# Patient Record
Sex: Female | Born: 1945 | Race: Asian | Hispanic: No | Smoking: Never smoker
Health system: Southern US, Community
[De-identification: ages and names within clinical notes are randomized; demographics above are authoritative.]

## PROBLEM LIST (undated history)

## (undated) DIAGNOSIS — I10 Essential (primary) hypertension: Secondary | ICD-10-CM

## (undated) DIAGNOSIS — M1990 Unspecified osteoarthritis, unspecified site: Secondary | ICD-10-CM

## (undated) DIAGNOSIS — I491 Atrial premature depolarization: Secondary | ICD-10-CM

## (undated) DIAGNOSIS — E785 Hyperlipidemia, unspecified: Secondary | ICD-10-CM

## (undated) DIAGNOSIS — I471 Supraventricular tachycardia, unspecified: Secondary | ICD-10-CM

## (undated) HISTORY — DX: Hyperlipidemia, unspecified: E78.5

## (undated) HISTORY — DX: Essential (primary) hypertension: I10

## (undated) HISTORY — PX: KNEE SURGERY: SHX244

## (undated) HISTORY — DX: Atrial premature depolarization: I49.1

## (undated) HISTORY — DX: Supraventricular tachycardia, unspecified: I47.10

## (undated) HISTORY — PX: JOINT REPLACEMENT: SHX530

## (undated) HISTORY — DX: Supraventricular tachycardia: I47.1

---

## 1994-01-28 HISTORY — PX: KNEE ARTHROSCOPY: SHX127

## 2009-05-29 ENCOUNTER — Ambulatory Visit: Payer: Self-pay | Admitting: Family Medicine

## 2009-06-29 ENCOUNTER — Encounter (INDEPENDENT_AMBULATORY_CARE_PROVIDER_SITE_OTHER): Payer: Self-pay | Admitting: Family Medicine

## 2009-06-29 ENCOUNTER — Ambulatory Visit: Payer: Self-pay | Admitting: Internal Medicine

## 2009-06-29 LAB — CONVERTED CEMR LAB
ALT: 9 units/L (ref 0–35)
AST: 16 units/L (ref 0–37)
Alkaline Phosphatase: 45 units/L (ref 39–117)
Basophils Absolute: 0 10*3/uL (ref 0.0–0.1)
Calcium: 8.9 mg/dL (ref 8.4–10.5)
Chloride: 106 meq/L (ref 96–112)
Creatinine, Ser: 0.75 mg/dL (ref 0.40–1.20)
Eosinophils Absolute: 0.1 10*3/uL (ref 0.0–0.7)
HDL: 75 mg/dL (ref >39)
LDL Cholesterol: 180 mg/dL — ABNORMAL HIGH (ref 0–99)
Lymphocytes Relative: 32 % (ref 12–46)
Lymphs Abs: 1.4 10*3/uL (ref 0.7–4.0)
MCV: 87.7 fL (ref 78.0–100.0)
Neutrophils Relative %: 58 % (ref 43–77)
Platelets: 198 10*3/uL (ref 150–400)
RDW: 13.3 % (ref 11.5–15.5)
TSH: 1.165 microintl units/mL (ref 0.350–4.500)
Total Bilirubin: 1.3 mg/dL — ABNORMAL HIGH (ref 0.3–1.2)
Total CHOL/HDL Ratio: 3.6
VLDL: 14 mg/dL (ref 0–40)
Vit D, 25-Hydroxy: 11 ng/mL — ABNORMAL LOW (ref 30–89)
WBC: 4.2 10*3/uL (ref 4.0–10.5)

## 2009-07-12 ENCOUNTER — Ambulatory Visit: Payer: Self-pay | Admitting: Internal Medicine

## 2009-07-25 ENCOUNTER — Ambulatory Visit (HOSPITAL_COMMUNITY): Admission: RE | Admit: 2009-07-25 | Discharge: 2009-07-25 | Payer: Self-pay | Admitting: Family Medicine

## 2009-08-07 ENCOUNTER — Encounter: Admission: RE | Admit: 2009-08-07 | Discharge: 2009-08-07 | Payer: Self-pay | Admitting: Family Medicine

## 2009-08-08 ENCOUNTER — Telehealth (INDEPENDENT_AMBULATORY_CARE_PROVIDER_SITE_OTHER): Payer: Self-pay | Admitting: *Deleted

## 2009-08-15 ENCOUNTER — Ambulatory Visit: Payer: Self-pay | Admitting: Family Medicine

## 2009-08-31 ENCOUNTER — Encounter (INDEPENDENT_AMBULATORY_CARE_PROVIDER_SITE_OTHER): Payer: Self-pay | Admitting: Family Medicine

## 2009-08-31 ENCOUNTER — Ambulatory Visit: Payer: Self-pay | Admitting: Cardiology

## 2009-08-31 ENCOUNTER — Ambulatory Visit (HOSPITAL_COMMUNITY): Admission: RE | Admit: 2009-08-31 | Discharge: 2009-08-31 | Payer: Self-pay | Admitting: Family Medicine

## 2009-11-23 ENCOUNTER — Observation Stay (HOSPITAL_COMMUNITY): Admission: EM | Admit: 2009-11-23 | Discharge: 2009-11-24 | Payer: Self-pay | Admitting: Emergency Medicine

## 2010-01-12 DIAGNOSIS — I10 Essential (primary) hypertension: Secondary | ICD-10-CM

## 2010-01-12 DIAGNOSIS — R0789 Other chest pain: Secondary | ICD-10-CM

## 2010-01-12 DIAGNOSIS — E785 Hyperlipidemia, unspecified: Secondary | ICD-10-CM | POA: Insufficient documentation

## 2010-01-15 ENCOUNTER — Ambulatory Visit: Payer: Self-pay | Admitting: Cardiovascular Disease

## 2010-02-01 ENCOUNTER — Encounter
Admission: RE | Admit: 2010-02-01 | Discharge: 2010-02-01 | Payer: Self-pay | Source: Home / Self Care | Attending: Family Medicine | Admitting: Family Medicine

## 2010-02-27 NOTE — Progress Notes (Signed)
Summary: triage/chest pain/dizziness  Phone Note Call from Patient   Caller: Daughter Reason for Call: Talk to Nurse Summary of Call: Daughter states she doesn't know what is wrong with her mother..She has been having chest pain and is dizzy. She has been unable to sleep all night. Patient is from [place] and is 65 years old. Patient had diagnostic study of mass in left breast found on mammogram 07/2009 which states mass is probably benign. Patient and family decided to have 6 month follow up instead of biospy. Advised daughter to take mom to the emergency room or call 911 since the chest pain with dizziness is a new concern and possibly cardiac related.  Daughter states understanding and will do so. Initial call taken by: Conchita Paris,  August 08, 2009 9:33 AM

## 2010-04-11 LAB — POCT I-STAT, CHEM 8
BUN: 14 mg/dL (ref 6–23)
Calcium, Ion: 1.14 mmol/L (ref 1.12–1.32)
Chloride: 105 mEq/L (ref 96–112)
Glucose, Bld: 112 mg/dL — ABNORMAL HIGH (ref 70–99)
HCT: 43 % (ref 36.0–46.0)
Potassium: 4.9 mEq/L (ref 3.5–5.1)

## 2010-04-11 LAB — HEMOGLOBIN A1C: Mean Plasma Glucose: 126 mg/dL — ABNORMAL HIGH (ref <117)

## 2010-04-11 LAB — LIPID PANEL
Cholesterol: 265 mg/dL — ABNORMAL HIGH (ref 0–200)
Total CHOL/HDL Ratio: 3.8 RATIO

## 2010-04-11 LAB — CARDIAC PANEL(CRET KIN+CKTOT+MB+TROPI)
Relative Index: 1.4 (ref 0.0–2.5)
Total CK: 106 U/L (ref 7–177)

## 2010-04-11 LAB — CBC
MCH: 29.7 pg (ref 26.0–34.0)
MCHC: 33.8 g/dL (ref 30.0–36.0)
Platelets: 201 10*3/uL (ref 150–400)
RDW: 13.7 % (ref 11.5–15.5)

## 2010-04-11 LAB — DIFFERENTIAL
Basophils Absolute: 0 10*3/uL (ref 0.0–0.1)
Basophils Relative: 1 % (ref 0–1)
Eosinophils Absolute: 0.2 10*3/uL (ref 0.0–0.7)
Neutro Abs: 2.6 10*3/uL (ref 1.7–7.7)
Neutrophils Relative %: 51 % (ref 43–77)

## 2010-04-11 LAB — TSH: TSH: 1.313 u[IU]/mL (ref 0.350–4.500)

## 2010-04-11 LAB — APTT: aPTT: 32 seconds (ref 24–37)

## 2010-04-11 LAB — HEPATIC FUNCTION PANEL
Albumin: 3.4 g/dL — ABNORMAL LOW (ref 3.5–5.2)
Alkaline Phosphatase: 37 U/L — ABNORMAL LOW (ref 39–117)
Indirect Bilirubin: 0.8 mg/dL (ref 0.3–0.9)
Total Bilirubin: 0.9 mg/dL (ref 0.3–1.2)
Total Protein: 6.8 g/dL (ref 6.0–8.3)

## 2010-04-11 LAB — URINALYSIS, ROUTINE W REFLEX MICROSCOPIC
Ketones, ur: NEGATIVE mg/dL
Nitrite: NEGATIVE
Protein, ur: NEGATIVE mg/dL
Urobilinogen, UA: 0.2 mg/dL (ref 0.0–1.0)
pH: 7.5 (ref 5.0–8.0)

## 2010-04-11 LAB — COMPREHENSIVE METABOLIC PANEL
Alkaline Phosphatase: 33 U/L — ABNORMAL LOW (ref 39–117)
BUN: 9 mg/dL (ref 6–23)
Chloride: 109 mEq/L (ref 96–112)
Glucose, Bld: 99 mg/dL (ref 70–99)
Potassium: 4.1 mEq/L (ref 3.5–5.1)
Total Bilirubin: 1.6 mg/dL — ABNORMAL HIGH (ref 0.3–1.2)

## 2010-04-11 LAB — CK TOTAL AND CKMB (NOT AT ARMC)
CK, MB: 1.8 ng/mL (ref 0.3–4.0)
Total CK: 125 U/L (ref 7–177)

## 2010-04-11 LAB — D-DIMER, QUANTITATIVE: D-Dimer, Quant: <0.22 ug/mL-FEU (ref 0.00–0.48)

## 2010-04-11 LAB — PROTIME-INR: INR: 0.98 (ref 0.00–1.49)

## 2010-04-11 LAB — LIPASE, BLOOD: Lipase: 45 U/L (ref 11–59)

## 2010-04-11 LAB — TROPONIN I: Troponin I: 0.02 ng/mL (ref 0.00–0.06)

## 2010-07-25 ENCOUNTER — Other Ambulatory Visit: Payer: Self-pay | Admitting: Family Medicine

## 2010-07-26 ENCOUNTER — Other Ambulatory Visit: Payer: Self-pay | Admitting: Family Medicine

## 2010-07-26 DIAGNOSIS — R922 Inconclusive mammogram: Secondary | ICD-10-CM

## 2010-08-02 ENCOUNTER — Ambulatory Visit
Admission: RE | Admit: 2010-08-02 | Discharge: 2010-08-02 | Disposition: A | Discharge status: Home / Self Care | Payer: Self-pay | Source: Ambulatory Visit | Attending: Family Medicine | Admitting: Family Medicine

## 2010-08-02 DIAGNOSIS — R922 Inconclusive mammogram: Secondary | ICD-10-CM

## 2011-03-12 ENCOUNTER — Ambulatory Visit (INDEPENDENT_AMBULATORY_CARE_PROVIDER_SITE_OTHER): Payer: Self-pay | Admitting: Cardiovascular Disease

## 2011-03-12 ENCOUNTER — Encounter: Payer: Self-pay | Admitting: Cardiovascular Disease

## 2011-03-12 VITALS — BP 140/90 | HR 76 | Ht 63.0 in | Wt 134.0 lb

## 2011-03-12 DIAGNOSIS — R002 Palpitations: Secondary | ICD-10-CM | POA: Insufficient documentation

## 2011-03-12 NOTE — Assessment & Plan Note (Signed)
Normal cardiac examination. Likely premature beats. Will arrange 48 hour holter monitor.

## 2011-03-12 NOTE — Patient Instructions (Signed)
Your physician recommends that you schedule a follow-up appointment in: 2-3 weeks.   Your physician has recommended that you wear a holter monitor. Holter monitors are medical devices that record the heart's electrical activity. Doctors most often use these monitors to diagnose arrhythmias. Arrhythmias are problems with the speed or rhythm of the heartbeat. The monitor is a small, portable device. You can wear one while you do your normal daily activities. This is usually used to diagnose what is causing palpitations/syncope (passing out).

## 2011-03-12 NOTE — Progress Notes (Signed)
   History of Present Illness: 66 yo [place] female with history of HTN, HLD here today to establish cardiology care. She has not been seen by cardiology in the past. I have reviewed the records from [facility] in [place] and see that she was admitted October 2011 with chest pain and ruled out with serial enzymes. She was recently seen in [facility] and had c/o palpitations. She tells me that her chest does not hurt. She occasionally feels her heart skip beats. Breathing has been ok. She speaks poor  English so communication if not easy.   Past Medical History  Diagnosis Date  . Chest pain   . Hyperlipidemia   . Hypertension     Past Surgical History  Procedure Date  . None     Current Outpatient Prescriptions  Medication Sig Dispense Refill  . alendronate (FOSAMAX) 70 MG tablet Take 70 mg by mouth every 7 (seven) days. Take with a full glass of water on an empty stomach.      . calcium-vitamin D (OSCAL WITH D) 500-200 MG-UNIT per tablet Take 1 tablet by mouth 2 (two) times daily.      . pravastatin (PRAVACHOL) 40 MG tablet Take 40 mg by mouth daily.        No Known Allergies  History   Social History  . Marital Status: Divorced    Spouse Name: N/A    Number of Children: N/A  . Years of Education: N/A   Occupational History  . Not on file.   Social History Main Topics  . Smoking status: Not on file  . Smokeless tobacco: Not on file  . Alcohol Use: Not on file  . Drug Use: Not on file  . Sexually Active: Not on file   Other Topics Concern  . Not on file   Social History Narrative   Negative family `history--Specifically artery disease or MI. The patient has two siblings died at a young age from fevers.The patient is separated and single. 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 She has never drink or smoke or done drugs    No family history on file. No cardiac issues in family She does not know of any  health problems in her family.   Review of Systems:  As stated in the HPI and otherwise negative.   BP 140/90  Pulse 76  Ht 5\' 3"  (1.6 m)  Wt 134 lb (60.782 kg)  BMI 23.74 kg/m2  Physical Examination: General: Well developed, well nourished, NAD HEENT: OP clear, mucus membranes moist SKIN: warm, dry. No rashes. Neuro: No focal deficits Musculoskeletal: Muscle strength 5/5 all ext Psychiatric: Mood and affect normal Neck: No JVD, no carotid bruits, no thyromegaly, no lymphadenopathy. Lungs:Clear bilaterally, no wheezes, rhonci, crackles Cardiovascular: Regular rate and rhythm. No murmurs, gallops or rubs. Abdomen:Soft. Bowel sounds present. Non-tender.  Extremities: No lower extremity edema. Pulses are 2 + in the bilateral DP/PT.  EKG: NSR, rate 71 bpm. Normal EKG

## 2011-03-19 ENCOUNTER — Encounter (INDEPENDENT_AMBULATORY_CARE_PROVIDER_SITE_OTHER): Payer: Self-pay

## 2011-03-19 DIAGNOSIS — R002 Palpitations: Secondary | ICD-10-CM

## 2011-03-22 ENCOUNTER — Telehealth: Payer: Self-pay | Admitting: Cardiovascular Disease

## 2011-03-22 DIAGNOSIS — R002 Palpitations: Secondary | ICD-10-CM

## 2011-03-22 MED ORDER — METOPROLOL SUCCINATE ER 25 MG PO TB24: 25.0000 mg | ORAL_TABLET | Freq: Every day | ORAL | Status: DC

## 2011-03-22 NOTE — Telephone Encounter (Signed)
Pt answered phone and then gave phone to grandson to speak with me. I reviewed monitor results and Dr. Gibson Ramp recommendations for pt to start Toprol 25 mg by mouth daily with grandson. Will send prescription to [facility] on [address] per his request.  Grandson aware of pt's follow up appt with Dr. Clifton James on Feb. 28, 2013.

## 2011-03-22 NOTE — Telephone Encounter (Signed)
PACs and short runs of SVT on monitor. Can we let her know and start Toprol XL 25 mg po Qdaily. Thanks, chris

## 2011-03-22 NOTE — Telephone Encounter (Signed)
Addended by: Dossie Arbour on: 03/22/2011 11:41 AM   Modules accepted: Orders

## 2011-03-28 ENCOUNTER — Ambulatory Visit (INDEPENDENT_AMBULATORY_CARE_PROVIDER_SITE_OTHER): Payer: Self-pay | Admitting: Cardiovascular Disease

## 2011-03-28 ENCOUNTER — Encounter: Payer: Self-pay | Admitting: Cardiovascular Disease

## 2011-03-28 DIAGNOSIS — R002 Palpitations: Secondary | ICD-10-CM

## 2011-03-28 DIAGNOSIS — R079 Chest pain, unspecified: Secondary | ICD-10-CM

## 2011-03-28 NOTE — Progress Notes (Signed)
   History of Present Illness: 66 yo Ariel Harris [place] female with history of HTN, HLD here today for cardiac follow up. I saw her two weeks ago as a new pt. She was admitted October 2011 to [facility] with chest pain and ruled out with serial enzymes. She was recently seen in [facility] and had c/o palpitations. She tells me that her chest does not hurt. She occasionally feels her heart skip beats. Breathing has been ok. I ordered a 48 hour Holter monitor which showed sinus rhythm with PACs and shorts runs of SVT.  I started Toprol and she is feeling less skipped beats.   She is here for follow up. She notes SOB and chest pain during the day. This is severe at times. The SOB seems to be worsened when she has the chest pain. Central tightness with no radiation of pain. Occurs at rest and with exertion.   Primary Care Physician:  None   Past Medical History  Diagnosis Date  . Chest pain   . Hyperlipidemia   . Hypertension     Past Surgical History  Procedure Date  . None     Current Outpatient Prescriptions  Medication Sig Dispense Refill  . alendronate (FOSAMAX) 70 MG tablet Take 70 mg by mouth every 7 (seven) days. Take with a full glass of water on an empty stomach.      . calcium-vitamin D (OSCAL WITH D) 500-200 MG-UNIT per tablet Take 1 tablet by mouth 2 (two) times daily.      . metoprolol succinate (TOPROL XL) 25 MG 24 hr tablet Take 1 tablet (25 mg total) by mouth daily.  30 tablet  11  . pravastatin (PRAVACHOL) 40 MG tablet Take 40 mg by mouth daily.        No Known Allergies  History   Social History  . Marital Status: Divorced    Spouse Name: N/A    Number of Children: N/A  . Years of Education: N/A   Occupational History  . Not on file.   Social History Main Topics  . Smoking status: Never Smoker   . Smokeless tobacco: Not on file  . Alcohol Use: No  . Drug Use: No  . Sexually Active: Not on file   Other Topics Concern  . Not on file   Social History  Narrative   Negative family `history--Specifically artery disease or MI. The patient has two siblings died at a young age from fevers.The patient is separated and single. 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 She has never drink or smoke or done drugs    No family history on file.  Review of Systems:  As stated in the HPI and otherwise negative.   BP 130/76  Pulse 57  Ht 5\' 7"  (1.702 m)  Wt 135 lb (61.236 kg)  BMI 21.14 kg/m2  Physical Examination: General: Well developed, well nourished, NAD HEENT: OP clear, mucus membranes moist SKIN: warm, dry. No rashes. Neuro: No focal deficits Musculoskeletal: Muscle strength 5/5 all ext Psychiatric: Mood and affect normal Neck: No JVD, no carotid bruits, no thyromegaly, no lymphadenopathy. Lungs:Clear bilaterally, no wheezes, rhonci, crackles Cardiovascular: Regular rate and rhythm. No murmurs, gallops or rubs. Abdomen:Soft. Bowel sounds present. Non-tender.  Extremities: No lower extremity edema. Pulses are 2 + in the bilateral DP/PT.  EKG:

## 2011-03-28 NOTE — Assessment & Plan Note (Signed)
Even with an interpretor, her communication is difficult. Because of her chest pain and SOB with risk factors will arrange Lexiscan stress myoview to exclude ischemia.

## 2011-03-28 NOTE — Assessment & Plan Note (Signed)
Most likely due to PACs or SVT. Symptoms are much improved with the addition of Toprol

## 2011-03-28 NOTE — Patient Instructions (Signed)
Your physician recommends that you schedule a follow-up appointment in:  1 month.  Your physician has requested that you have a lexiscan myoview. For further information please visit [email or web address]. Please follow instruction sheet, as given.    

## 2011-04-10 ENCOUNTER — Ambulatory Visit (HOSPITAL_COMMUNITY): Payer: Self-pay | Attending: Cardiology | Admitting: Radiology

## 2011-04-10 DIAGNOSIS — R0602 Shortness of breath: Secondary | ICD-10-CM

## 2011-04-10 DIAGNOSIS — R001 Bradycardia, unspecified: Secondary | ICD-10-CM

## 2011-04-10 DIAGNOSIS — I498 Other specified cardiac arrhythmias: Secondary | ICD-10-CM | POA: Insufficient documentation

## 2011-04-10 DIAGNOSIS — R079 Chest pain, unspecified: Secondary | ICD-10-CM | POA: Insufficient documentation

## 2011-04-10 MED ORDER — AMINOPHYLLINE 25 MG/ML IV SOLN
75.0000 mg | Freq: Once | INTRAVENOUS | Status: AC
  Administered 2011-04-10: 75 mg via INTRAVENOUS

## 2011-04-10 MED ORDER — REGADENOSON 0.4 MG/5ML IV SOLN
0.4000 mg | Freq: Once | INTRAVENOUS | Status: AC
  Administered 2011-04-10: 0.4 mg via INTRAVENOUS

## 2011-04-10 MED ORDER — TECHNETIUM TC 99M TETROFOSMIN IV KIT
30.0000 | PACK | Freq: Once | INTRAVENOUS | Status: AC | PRN
  Administered 2011-04-10: 30 via INTRAVENOUS

## 2011-04-10 MED ORDER — ATROPINE SULFATE 0.1 MG/ML IJ SOLN
0.5000 mg | Freq: Once | INTRAMUSCULAR | Status: AC
  Administered 2011-04-10: 0.5 mg via INTRAVENOUS

## 2011-04-10 MED ORDER — TECHNETIUM TC 99M TETROFOSMIN IV KIT
10.0000 | PACK | Freq: Once | INTRAVENOUS | Status: AC | PRN
  Administered 2011-04-10: 10 via INTRAVENOUS

## 2011-04-10 NOTE — Progress Notes (Signed)
[facility] SITE 3 NUCLEAR MED [address] [place] [address] [phone]  Cardiology Nuclear Med Study  [address] Ariel Harris is a 66 y.o. female [id number] [birth year]   Nuclear Med Background Indication for Stress Test:  Evaluation for Ischemia History: 08/11 Echo: EF: 60-65% Cardiac Risk Factors: Hypertension and Lipids  Symptoms:  Chest Pain, Chest Tightness, DOE, Palpitations and SOB   Nuclear Pre-Procedure Caffeine/Decaff Intake:  None NPO After: 8:30pm   Lungs:  clear IV 0.9% NS with Angio Cath:  20g  IV Site: R Antecubital  IV Started by:  Stanton Kidney, EMT-P  Chest Size (in):  32 Cup Size: B  Height: 5\' 7"  (1.702 m)  Weight:  133 lb (60.328 kg)  BMI:  Body mass index is 20.83 kg/(m^2). Tech Comments:  Meds taken as directed. This patient is very sensitive to [employer]. She had a syncopal episode with it. It took Aminophylline, Atropine, and 750cc NS infusion to get the patient to a hemodynamic state to be able to get her 2nd set of pictures. After time and lying down she was able to maintain a BP of 100/64, without symptoms and she went home. The patient's husband came and picked her up. S.Williams EMTP    Nuclear Med Study 1 or 2 day study: 1 day  Stress Test Type:  Eugenie Birks  Reading MD: Marca Ancona, MD  Order Authorizing Provider:  C.McAlhany  Resting Radionuclide: Technetium 73m Tetrofosmin  Resting Radionuclide Dose: 10.8 mCi   Stress Radionuclide:  Technetium 31m Tetrofosmin  Stress Radionuclide Dose: 32.8 mCi           Stress Protocol Rest HR: 59 Stress HR: 90  Rest BP: 123/90 Stress BP: 131/90  Exercise Time (min): n/a METS: n/a   Predicted Max HR: 156 bpm % Max HR: 57.69 bpm Rate Pressure Product: 86578   Dose of Adenosine (mg):  n/a Dose of Lexiscan: 0.4 mg  Dose of Atropine (mg): n/a Dose of Dobutamine: n/a mcg/kg/min (at max HR)  Stress Test Technologist: Milana Na, EMT-P  Nuclear Technologist:  Domenic Polite, CNMT      Rest Procedure:  Myocardial perfusion imaging was performed at rest 45 minutes following the intravenous administration of Technetium 33m Tetrofosmin. Rest ECG: NSR  Stress Procedure:  The patient received IV Lexiscan 0.4 mg over 15-seconds.  Technetium 54m Tetrofosmin injected at 30-seconds.  There were no significant changes, rhythm changes from SBradycardia to a Nodal rhythm with pacs/pjcs with Lexiscan.  Quantitative spect images were obtained after a 45 minute delay. Stress ECG: No ST changes but developed junctional bradycardia with infusion.   QPS Raw Data Images:  Normal; no motion artifact; normal heart/lung ratio. Stress Images:  Small, mild apical perfusion defect.  Rest Images:  Small, mild apical perfusion defect.  Subtraction (SDS):  Fixed small mild apical perfusion defect.  Transient Ischemic Dilatation (Normal <1.22):  0.89 Lung/Heart Ratio (Normal <0.45):  0.29  Quantitative Gated Spect Images QGS EDV:  75 ml QGS ESV:  19 ml QGS cine images:  NL LV Function; NL Wall Motion QGS EF: 74%  Impression Exercise Capacity:  Lexiscan with no exercise. BP Response:  Hypotensive blood pressure response. Clinical Symptoms:  Chest tightness.  ECG Impression:  No ST changes but became bradycardic with junctional rhythm with infusion.  Comparison with Prior Nuclear Study: No prior study.  Overall Impression:  Normal stress nuclear study.  Fixed, small mild apical perfusion defect with normal wall motion likely represents apical thinning.  Ariel Harris

## 2011-05-02 ENCOUNTER — Ambulatory Visit (INDEPENDENT_AMBULATORY_CARE_PROVIDER_SITE_OTHER): Payer: Self-pay | Admitting: Cardiovascular Disease

## 2011-05-02 ENCOUNTER — Encounter: Payer: Self-pay | Admitting: Cardiovascular Disease

## 2011-05-02 DIAGNOSIS — R079 Chest pain, unspecified: Secondary | ICD-10-CM

## 2011-05-02 DIAGNOSIS — R002 Palpitations: Secondary | ICD-10-CM

## 2011-05-02 MED ORDER — METOPROLOL TARTRATE 25 MG PO TABS: 25.0000 mg | ORAL_TABLET | Freq: Two times a day (BID) | ORAL | Status: DC

## 2011-05-02 NOTE — Assessment & Plan Note (Signed)
Likely due to PACs and SVT. Will continue beta blocker. Will change Toprol to Lopressor 25 mg po BID.

## 2011-05-02 NOTE — Patient Instructions (Signed)
Your physician wants you to follow-up in: 12 months.  You will receive a reminder letter in the mail two months in advance. If you don't receive a letter, please call our office to schedule the follow-up appointment.  Your physician has recommended you make the following change in your medication: Stop Toprol.  Start metoprolol tartrate 25 mg by mouth twice daily.

## 2011-05-02 NOTE — Progress Notes (Signed)
History of Present Illness: 66 yo [place] female with history of HTN, HLD here today for cardiac follow up. I saw her several weeks ago as a new pt. She was admitted October 2011 to [facility] with chest pain and ruled out with serial enzymes. She was recently seen in [facility] and had c/o palpitations.  She occasionally feels her heart skip beats. Breathing has been ok. I ordered a 48 hour Holter monitor which showed sinus rhythm with PACs and shorts runs of SVT. I started Toprol and she is feeling less skipped beats. At her last visit, she described chest pains.  This is severe at times. The SOB seems to be worsened when she has the chest pain. Central tightness with no radiation of pain. Occurs at rest and with exertion. I arranged a stress myoview on 04/10/11 which showed no ischemia with normal LV function. Her palpitations have improved. No recurrent chest pain.    Primary Care Physician: None  Past Medical History  Diagnosis Date  . Chest pain   . Hyperlipidemia   . Hypertension   . SVT (supraventricular tachycardia)   . PAC (premature atrial contraction)     Past Surgical History  Procedure Date  . None     Current Outpatient Prescriptions  Medication Sig Dispense Refill  . alendronate (FOSAMAX) 70 MG tablet Take 70 mg by mouth every 7 (seven) days. Take with a full glass of water on an empty stomach.      . calcium-vitamin D (OSCAL WITH D) 500-200 MG-UNIT per tablet Take 1 tablet by mouth 2 (two) times daily.      . pravastatin (PRAVACHOL) 40 MG tablet Take 40 mg by mouth daily.      . metoprolol succinate (TOPROL XL) 25 MG 24 hr tablet Take 1 tablet (25 mg total) by mouth daily.  30 tablet  11    No Known Allergies  History   Social History  . Marital Status: Divorced    Spouse Name: N/A    Number of Children: N/A  . Years of Education: N/A   Occupational History  . Not on file.   Social History Main Topics  . Smoking status: Never Smoker   .  Smokeless tobacco: Not on file  . Alcohol Use: No  . Drug Use: No  . Sexually Active: Not on file   Other Topics Concern  . Not on file   Social History Narrative   Negative family `history--Specifically artery disease or MI. The patient has two siblings died at a young age from fevers.The patient is separated and single. 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 She has never drink or smoke or done drugs    No family history on file.  Review of Systems:  As stated in the HPI and otherwise negative.   BP 160/90  Pulse 72  Ht 5\' 5"  (1.651 m)  Wt 136 lb (61.689 kg)  BMI 22.63 kg/m2  Physical Examination: General: Well developed, well nourished, NAD HEENT: OP clear, mucus membranes moist SKIN: warm, dry. No rashes. Neuro: No focal deficits Musculoskeletal: Muscle strength 5/5 all ext Psychiatric: Mood and affect normal Neck: No JVD, no carotid bruits, no thyromegaly, no lymphadenopathy. Lungs:Clear bilaterally, no wheezes, rhonci, crackles Cardiovascular: Regular rate and rhythm. No murmurs, gallops or rubs. Abdomen:Soft. Bowel sounds present. Non-tender.  Extremities: No lower extremity edema. Pulses are 2 + in the bilateral DP/PT.  Stress Myoview:  04/10/11: Stress Procedure: The patient received IV Lexiscan 0.4 mg over 15-seconds. Technetium 69m Tetrofosmin injected at 30-seconds. There were no significant changes, rhythm changes from SBradycardia to a Nodal rhythm with pacs/pjcs with Lexiscan. Quantitative spect images were obtained after a 45 minute delay.  Stress ECG: No ST changes but developed junctional bradycardia with infusion.  QPS  Raw Data Images: Normal; no motion artifact; normal heart/lung ratio.  Stress Images: Small, mild apical perfusion defect.  Rest Images: Small, mild apical perfusion defect.  Subtraction (SDS): Fixed small mild apical perfusion defect.  Transient Ischemic Dilatation (Normal <1.22):  0.89  Lung/Heart Ratio (Normal <0.45): 0.29  Quantitative Gated Spect Images  QGS EDV: 75 ml  QGS ESV: 19 ml  QGS cine images: NL LV Function; NL Wall Motion  QGS EF: 74%  Impression  Exercise Capacity: Lexiscan with no exercise.  BP Response: Hypotensive blood pressure response.  Clinical Symptoms: Chest tightness.  ECG Impression: No ST changes but became bradycardic with junctional rhythm with infusion.  Comparison with Prior Nuclear Study: No prior study.  Overall Impression: Normal stress nuclear study. Fixed, small mild apical perfusion defect with normal wall motion likely represents apical thinning.

## 2011-05-02 NOTE — Assessment & Plan Note (Signed)
Resolved. Stress test does not show ischemia. Likely non-cardiac chest pain. No further workup.

## 2011-07-10 ENCOUNTER — Other Ambulatory Visit: Payer: Self-pay | Admitting: Family Medicine

## 2011-07-10 DIAGNOSIS — N63 Unspecified lump in unspecified breast: Secondary | ICD-10-CM

## 2011-08-06 ENCOUNTER — Ambulatory Visit
Admission: RE | Admit: 2011-08-06 | Discharge: 2011-08-06 | Disposition: A | Discharge status: Home / Self Care | Payer: Self-pay | Source: Ambulatory Visit | Attending: Family Medicine | Admitting: Family Medicine

## 2011-08-06 DIAGNOSIS — N63 Unspecified lump in unspecified breast: Secondary | ICD-10-CM

## 2011-12-18 ENCOUNTER — Ambulatory Visit (INDEPENDENT_AMBULATORY_CARE_PROVIDER_SITE_OTHER): Payer: Self-pay | Admitting: Nurse Practitioner

## 2011-12-18 ENCOUNTER — Encounter: Payer: Self-pay | Admitting: Nurse Practitioner

## 2011-12-18 VITALS — BP 110/76 | HR 60 | Ht 62.0 in | Wt 144.8 lb

## 2011-12-18 DIAGNOSIS — R002 Palpitations: Secondary | ICD-10-CM

## 2011-12-18 MED ORDER — METOPROLOL TARTRATE 25 MG PO TABS: 12.5000 mg | ORAL_TABLET | Freq: Two times a day (BID) | ORAL | Status: DC

## 2011-12-18 NOTE — Progress Notes (Signed)
Ariel Harris Date of Birth: [birth year] Medical Record #[id number]  History of Present Illness: [name] is seen back today for a work in visit. She is seen for Dr. Clifton James. [place] is [place] She has HTN, HLD and palpitations with a past 48 hour Holter showing just PACs and short runs of SVT. She is on chronic beta blocker therapy. She had a negative Myoview back in March of this year which showed no ischemia with normal LV function.   She was last here in April and was felt to be doing ok.   She comes in today. She is here with the interpreter, "Jamelle Haring". She is complaining of fatigue. Not a lot of palpitations. Not using caffeine. Tries to stay active. Does walk.  No chest pain. Will get dizzy if she bends over. No passing out. Just fatigued. No chest pain. No recent labs. Sometimes she can hear her heart beating in her ears.   Current Outpatient Prescriptions on File Prior to Visit  Medication Sig Dispense Refill  . alendronate (FOSAMAX) 70 MG tablet Take 70 mg by mouth every 7 (seven) days. Take with a full glass of water on an empty stomach.      . calcium-vitamin D (OSCAL WITH D) 500-200 MG-UNIT per tablet Take 1 tablet by mouth 2 (two) times daily.      . pravastatin (PRAVACHOL) 40 MG tablet Take 40 mg by mouth daily.      . [DISCONTINUED] metoprolol tartrate (LOPRESSOR) 25 MG tablet Take 1 tablet (25 mg total) by mouth 2 (two) times daily.  60 tablet  11    No Known Allergies  Past Medical History  Diagnosis Date  . Chest pain   . Hyperlipidemia   . Hypertension   . SVT (supraventricular tachycardia)   . PAC (premature atrial contraction)     Past Surgical History  Procedure Date  . None     History  Smoking status  . Never Smoker   Smokeless tobacco  . Not on file    History  Alcohol Use No    History reviewed. No pertinent family history.  Review of Systems: The review of systems is per the HPI.  All other systems were reviewed and are  negative.  Physical Exam: BP 110/76  Pulse 60  Ht 5\' 2"  (1.575 m)  Wt 144 lb 12.8 oz (65.681 kg)  BMI 26.48 kg/m2  SpO2 98% Patient is very pleasant and in no acute distress. Skin is warm and dry. Color is normal.  HEENT is unremarkable. Normocephalic/atraumatic. PERRL. Sclera are nonicteric. Neck is supple. No masses. No JVD. Lungs are clear. Cardiac exam shows a regular rate and rhythm. Abdomen is soft. Extremities are without edema. Gait and ROM are intact. No gross neurologic deficits noted.   LABORATORY DATA: Pending  Overall Impression:  Normal stress nuclear study. Fixed, small mild apical perfusion defect with normal wall motion likely represents apical thinning.   Marca Ancona     Echo Study Conclusions from 2011  Left ventricle: The cavity size was normal. Wall thickness was normal. Systolic function was normal. The estimated ejection fraction was in the range of 60% to 65%. Wall motion was normal; there were no regional wall motion abnormalities. Left ventricular diastolic function parameters were normal.   Lab Results  Component Value Date   WBC 5.2 11/23/2009   HGB 14.6 11/23/2009   HCT 43.0 11/23/2009   PLT 201 11/23/2009   GLUCOSE 99 11/24/2009   CHOL  Value:  265        ATP III CLASSIFICATION:  <200     mg/dL   Desirable  409-811  mg/dL   Borderline High  >=914    mg/dL   High       * 78/29/5621   TRIG 67 11/24/2009   HDL 69 11/24/2009   LDLCALC  Value: 183        Total Cholesterol/HDL:CHD Risk Coronary Heart Disease Risk Table                     Men   Women  1/2 Average Risk   3.4   3.3  Average Risk       5.0   4.4  2 X Average Risk   9.6   7.1  3 X Average Risk  23.4   11.0        Use the calculated Patient Ratio above and the CHD Risk Table to determine the patient's CHD Risk.        ATP III CLASSIFICATION (LDL):  <100     mg/dL   Optimal  308-657  mg/dL   Near or Above                    Optimal  130-159  mg/dL   Borderline  846-962  mg/dL   High  >952      mg/dL   Very High* 84/13/2440   ALT 11 11/24/2009   AST 17 11/24/2009   NA 141 11/24/2009   K 4.1 11/24/2009   CL 109 11/24/2009   CREATININE 0.72 11/24/2009   BUN 9 11/24/2009   CO2 27 11/24/2009   TSH 1.313 11/23/2009   INR 0.98 11/23/2009   HGBA1C  Value: 6.0 (NOTE)                                                                       According to the ADA Clinical Practice Recommendations for 2011, when HbA1c is used as a screening test:   >=6.5%   Diagnostic of Diabetes Mellitus           (if abnormal result  is confirmed)  5.7-6.4%   Increased risk of developing Diabetes Mellitus  References:Diagnosis and Classification of Diabetes Mellitus,Diabetes Care,2011,34(Suppl 1):S62-S69 and Standards of Medical Care in         Diabetes - 2011,Diabetes Care,2011,34  (Suppl 1):S11-S61.* 11/23/2009    Assessment / Plan: Fatigue - hard to say. Could be from her medicine. BP is low normal. We will try cutting her metoprolol to just a half a tablet two times a day. Check labs today. Will see if this helps. For now, keep her follow up with Dr. Clifton James as planned.   HTN - blood pressure low normal. Will see if her symptoms improve on lower doses of her medicines.   Palpitations - needs to continue to avoid caffeine.   Patient is agreeable to this plan and will call if any problems develop in the interim.

## 2011-12-18 NOTE — Patient Instructions (Addendum)
Lets try cutting your Metoprolol back to just a half a tablet two times a day  We need to check some labs today  Call us if you do not start feeling better.  Call the [facility] office at [phone] if you have any questions, problems or concerns.

## 2011-12-19 LAB — BASIC METABOLIC PANEL
BUN: 19 mg/dL (ref 6–23)
CO2: 31 mEq/L (ref 19–32)
Calcium: 9.9 mg/dL (ref 8.4–10.5)
Chloride: 103 mEq/L (ref 96–112)
Creatinine, Ser: 0.8 mg/dL (ref 0.4–1.2)
GFR: 73.38 mL/min (ref >60.00)
Glucose, Bld: 93 mg/dL (ref 70–99)
Potassium: 3.8 mEq/L (ref 3.5–5.1)
Sodium: 140 mEq/L (ref 135–145)

## 2011-12-19 LAB — CBC WITH DIFFERENTIAL/PLATELET
Basophils Absolute: 0 10*3/uL (ref 0.0–0.1)
Basophils Relative: 0.6 % (ref 0.0–3.0)
Eosinophils Absolute: 0.1 10*3/uL (ref 0.0–0.7)
Eosinophils Relative: 2.8 % (ref 0.0–5.0)
HCT: 46 % (ref 36.0–46.0)
Hemoglobin: 15 g/dL (ref 12.0–15.0)
Lymphocytes Relative: 37.1 % (ref 12.0–46.0)
Lymphs Abs: 1.7 10*3/uL (ref 0.7–4.0)
MCHC: 32.7 g/dL (ref 30.0–36.0)
MCV: 90.4 fl (ref 78.0–100.0)
Monocytes Absolute: 0.3 10*3/uL (ref 0.1–1.0)
Monocytes Relative: 6.4 % (ref 3.0–12.0)
Neutro Abs: 2.5 10*3/uL (ref 1.4–7.7)
Neutrophils Relative %: 53.1 % (ref 43.0–77.0)
Platelets: 172 10*3/uL (ref 150.0–400.0)
RBC: 5.08 Mil/uL (ref 3.87–5.11)
RDW: 13.6 % (ref 11.5–14.6)
WBC: 4.7 10*3/uL (ref 4.5–10.5)

## 2011-12-19 LAB — TSH: TSH: 1.05 u[IU]/mL (ref 0.35–5.50)

## 2012-02-03 ENCOUNTER — Telehealth: Payer: Self-pay | Admitting: Nurse Practitioner

## 2012-02-03 ENCOUNTER — Other Ambulatory Visit: Payer: Self-pay | Admitting: *Deleted

## 2012-02-03 DIAGNOSIS — R002 Palpitations: Secondary | ICD-10-CM

## 2012-02-03 MED ORDER — PRAVASTATIN SODIUM 40 MG PO TABS: 40.0000 mg | ORAL_TABLET | Freq: Every day | ORAL | Status: DC

## 2012-02-03 MED ORDER — METOPROLOL TARTRATE 25 MG PO TABS: 12.5000 mg | ORAL_TABLET | Freq: Two times a day (BID) | ORAL | Status: DC

## 2012-02-03 NOTE — Telephone Encounter (Signed)
I spoke with pt through interpreter and confirmed pharmacy is [facility]. Pt was told through interpreter that Pravastatin and metoprolol would be refilled but she should contact her primary provider for refills of Cacium/Vit D and Fosamax.

## 2012-02-03 NOTE — Telephone Encounter (Signed)
Walk In pt Form " pt Needs ReFill" Sent to [facility]  02/03/12/KM

## 2012-02-03 NOTE — Addendum Note (Signed)
Addended by: Dossie Arbour on: 02/03/2012 03:12 PM   Modules accepted: Orders

## 2012-02-07 ENCOUNTER — Encounter: Payer: Self-pay | Admitting: Cardiology

## 2012-02-07 ENCOUNTER — Ambulatory Visit (INDEPENDENT_AMBULATORY_CARE_PROVIDER_SITE_OTHER): Payer: Self-pay | Admitting: Cardiology

## 2012-02-07 ENCOUNTER — Telehealth: Payer: Self-pay | Admitting: *Deleted

## 2012-02-07 VITALS — BP 128/84 | HR 60 | Wt 146.1 lb

## 2012-02-07 DIAGNOSIS — R0789 Other chest pain: Secondary | ICD-10-CM

## 2012-02-07 DIAGNOSIS — R002 Palpitations: Secondary | ICD-10-CM

## 2012-02-07 DIAGNOSIS — E7800 Pure hypercholesterolemia, unspecified: Secondary | ICD-10-CM

## 2012-02-07 DIAGNOSIS — I10 Essential (primary) hypertension: Secondary | ICD-10-CM

## 2012-02-07 MED ORDER — NITROGLYCERIN 0.4 MG SL SUBL: 0.4000 mg | SUBLINGUAL_TABLET | SUBLINGUAL | Status: DC | PRN

## 2012-02-07 NOTE — Assessment & Plan Note (Signed)
The patient is on metoprolol for her blood pressure.  Pressure is remaining stable on current therapy

## 2012-02-07 NOTE — Progress Notes (Signed)
Ariel Harris Date of Birth:  03/04/1945 [facility] [address] [place], [place]  [address] [phone]  Fax   [phone]  HPI: This pleasant 67 year old woman is seen as a walk-in work in.  He comes in because of complaints of chest pain and shortness of breath. She is [place] She has HTN, HLD and palpitations with a past 48 hour Holter showing just PACs and short runs of SVT. She is on chronic beta blocker therapy. She had a negative Myoview back in March of this year which showed no ischemia with normal LV function.      Current Outpatient Prescriptions  Medication Sig Dispense Refill  . alendronate (FOSAMAX) 70 MG tablet Take 70 mg by mouth every 7 (seven) days. Take with a full glass of water on an empty stomach.      . calcium-vitamin D (OSCAL WITH D) 500-200 MG-UNIT per tablet Take 1 tablet by mouth 2 (two) times daily.      . metoprolol tartrate (LOPRESSOR) 25 MG tablet Take 0.5 tablets (12.5 mg total) by mouth 2 (two) times daily.  30 tablet  11  . nitroGLYCERIN (NITROSTAT) 0.4 MG SL tablet Place 1 tablet (0.4 mg total) under the tongue every 5 (five) minutes as needed for chest pain.  25 tablet  prn  . pravastatin (PRAVACHOL) 40 MG tablet Take 1 tablet (40 mg total) by mouth daily.  30 tablet  11    No Known Allergies  Patient Active Problem List  Diagnosis  . HYPERLIPIDEMIA  . HYPERTENSION  . CHEST PAIN, ATYPICAL  . Palpitations  . Chest pain    History  Smoking status  . Never Smoker   Smokeless tobacco  . Not on file    History  Alcohol Use No    No family history on file.  Review of Systems: The patient denies any heat or cold intolerance.  No weight gain or weight loss.  The patient denies headaches or blurry vision.  There is no cough or sputum production.  The patient denies dizziness.  There is no hematuria or hematochezia.  The patient denies any muscle aches or arthritis.  The patient denies any rash.  The patient  denies frequent falling or instability.  There is no history of depression or anxiety.  All other systems were reviewed and are negative.   Physical Exam: Filed Vitals:   02/07/12 1014  BP: 128/84  Pulse: 60   the general appearance reveals a well-developed well-nourished woman in no distress.The head and neck exam reveals pupils equal and reactive.  Extraocular movements are full.  There is no scleral icterus.  The mouth and pharynx are normal.  The neck is supple.  The carotids reveal no bruits.  The jugular venous pressure is normal.  The  thyroid is not enlarged.  There is no lymphadenopathy.  The chest is clear to percussion and auscultation.  There are no rales or rhonchi.  Expansion of the chest is symmetrical.  The precordium is quiet.  The first heart sound is normal.  The second heart sound is physiologically split.  There is no murmur gallop rub or click.  There is no abnormal lift or heave.  The abdomen is soft and nontender.  The bowel sounds are normal.  The liver and spleen are not enlarged.  There are no abdominal masses.  There are no abdominal bruits.  Extremities reveal good pedal pulses.  There is no phlebitis or edema.  There is no  cyanosis or clubbing.  Strength is normal and symmetrical in all extremities.  There is no lateralizing weakness.  There are no sensory deficits.  The skin is warm and dry.  There is no rash.  EKG shows normal sinus rhythm and is within normal limits    Assessment / Plan: The patient has had a previous normal Myoview stress test.  Her chest pain is atypical.  We are going to send her for a chest x-ray since an x-ray several years ago showed mild cardiomegaly.  She is to continue her current medication and we also prescribed Nitrostat 0.4 mg sublingual use as a therapeutic trial.  It is possible that she is having some mild coronary spasm causing some of her symptoms of chest discomfort. The patient is to return in 6 months for followup office visit or  sooner if necessary.

## 2012-02-07 NOTE — Patient Instructions (Addendum)
Use your NTG under your tongue for recurrent chest pain. May take one tablet every 5 minutes. If you are still having discomfort after 3 tablets in 15 minutes, call 911.   WILL HAVE YOU GO FOR A CHEST XRAY TODAY TO THE [facility] FROM [facility]  Your physician wants you to follow-up in: 6 months You will receive a reminder letter in the mail two months in advance. If you don't receive a letter, please call our office to schedule the follow-up appointment.

## 2012-02-07 NOTE — Assessment & Plan Note (Signed)
The patient has a past history of a rapid heart rate periodically.  She has felt improvement since being on beta blocker

## 2012-02-07 NOTE — Assessment & Plan Note (Signed)
Her chest pain is in the front of her chest.  It does not radiate to the neck or down the arm.  It does not appear to be related to exertion or to food intake.

## 2012-02-07 NOTE — Telephone Encounter (Signed)
Spoke with patient grand daughter who lives with patient about chest xray that she was suppose to get today.  Grand daughter states patient was unsure where to go for that so she will go Monday or Tuesday next week.

## 2012-02-10 ENCOUNTER — Telehealth: Payer: Self-pay | Admitting: *Deleted

## 2012-02-10 ENCOUNTER — Ambulatory Visit (INDEPENDENT_AMBULATORY_CARE_PROVIDER_SITE_OTHER)
Admission: RE | Admit: 2012-02-10 | Discharge: 2012-02-10 | Disposition: A | Discharge status: Home / Self Care | Payer: Self-pay | Source: Ambulatory Visit | Attending: Cardiology | Admitting: Cardiology

## 2012-02-10 DIAGNOSIS — R0789 Other chest pain: Secondary | ICD-10-CM

## 2012-02-10 NOTE — Telephone Encounter (Signed)
Advised patient's granddaughter.

## 2012-02-10 NOTE — Telephone Encounter (Signed)
Message copied by Burnell Blanks on Mon Feb 10, 2012  5:52 PM ------      Message from: Cassell Clement      Created: Mon Feb 10, 2012 12:25 PM       Please report.  The chest x-ray is stable.  Continue present medication

## 2012-05-01 ENCOUNTER — Encounter: Payer: Self-pay | Admitting: Cardiovascular Disease

## 2012-05-01 ENCOUNTER — Ambulatory Visit (INDEPENDENT_AMBULATORY_CARE_PROVIDER_SITE_OTHER): Payer: Medicare Other | Admitting: Cardiovascular Disease

## 2012-05-01 VITALS — BP 128/78 | HR 54 | Ht 63.0 in | Wt 148.4 lb

## 2012-05-01 DIAGNOSIS — R079 Chest pain, unspecified: Secondary | ICD-10-CM

## 2012-05-01 DIAGNOSIS — I498 Other specified cardiac arrhythmias: Secondary | ICD-10-CM

## 2012-05-01 DIAGNOSIS — I10 Essential (primary) hypertension: Secondary | ICD-10-CM | POA: Diagnosis not present

## 2012-05-01 DIAGNOSIS — I471 Supraventricular tachycardia: Secondary | ICD-10-CM

## 2012-05-01 NOTE — Progress Notes (Signed)
History of Present Illness: 67 yo [place] female with history of HTN, HLD here today for cardiac follow up. She was admitted October 2011 to [facility] with chest pain and ruled out with serial enzymes. She was then seen in [facility] and had c/o palpitations. She occasionally feels her heart skip beats. Breathing has been ok. I ordered a 48 hour Holter monitor which showed sinus rhythm with PACs and shorts runs of SVT. I started Toprol. She then described chest tightness. I arranged a stress myoview on 04/10/11 which showed no ischemia with normal LV function. Seen by Norma Fredrickson, NP November 2013 and had c/o fatigue. Toprol was reduced. Seen again for chest pain by Dr. Patty Sermons in January 2014 for chest pain and it was felt to be atypical.   She is here today for planned f/u. An interpretor is here with her. She reports no chest pain or SOB. She is feeling well. No palpitations.   Primary Care Physician: None  Last Lipid Profile:Lipid Panel     Component Value Date/Time   CHOL  Value: 265        ATP III CLASSIFICATION:  <200     mg/dL   Desirable  478-295  mg/dL   Borderline High  >=621    mg/dL   High       * 30/86/5784 0750   TRIG 67 11/24/2009 0750   HDL 69 11/24/2009 0750   CHOLHDL 3.8 11/24/2009 0750   VLDL 13 11/24/2009 0750   LDLCALC  Value: 183        Total Cholesterol/HDL:CHD Risk Coronary Heart Disease Risk Table                     Men   Women  1/2 Average Risk   3.4   3.3  Average Risk       5.0   4.4  2 X Average Risk   9.6   7.1  3 X Average Risk  23.4   11.0        Use the calculated Patient Ratio above and the CHD Risk Table to determine the patient's CHD Risk.        ATP III CLASSIFICATION (LDL):  <100     mg/dL   Optimal  696-295  mg/dL   Near or Above                    Optimal  130-159  mg/dL   Borderline  284-132  mg/dL   High  >440     mg/dL   Very High* 11/24/2534 0750     Past Medical History  Diagnosis Date  . Chest pain   . Hyperlipidemia   .  Hypertension   . SVT (supraventricular tachycardia)   . PAC (premature atrial contraction)     Past Surgical History  Procedure Laterality Date  . None      Current Outpatient Prescriptions  Medication Sig Dispense Refill  . alendronate (FOSAMAX) 70 MG tablet Take 70 mg by mouth every 7 (seven) days. Take with a full glass of water on an empty stomach.      . calcium-vitamin D (OSCAL WITH D) 500-200 MG-UNIT per tablet Take 1 tablet by mouth 2 (two) times daily.      . metoprolol tartrate (LOPRESSOR) 25 MG tablet Take 0.5 tablets (12.5 mg total) by mouth 2 (two) times daily.  30 tablet  11  . nitroGLYCERIN (NITROSTAT) 0.4 MG SL tablet Place 1  tablet (0.4 mg total) under the tongue every 5 (five) minutes as needed for chest pain.  25 tablet  prn  . pravastatin (PRAVACHOL) 40 MG tablet Take 1 tablet (40 mg total) by mouth daily.  30 tablet  11   No current facility-administered medications for this visit.    No Known Allergies  History   Social History  . Marital Status: Divorced    Spouse Name: N/A    Number of Children: N/A  . Years of Education: N/A   Occupational History  . Not on file.   Social History Main Topics  . Smoking status: Never Smoker   . Smokeless tobacco: Not on file  . Alcohol Use: No  . Drug Use: No  . Sexually Active: Not on file   Other Topics Concern  . Not on file   Social History Narrative   Negative family `history--Specifically artery disease or MI. The patient has two siblings died at a young age from fevers.   The patient is separated and single. 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 She has never drink or smoke or done drugs    No family history on file.  Review of Systems:  As stated in the HPI and otherwise negative.   BP 128/78  Pulse 54  Ht 5\' 3"  (1.6 m)  Wt 148 lb 6.4 oz (67.314 kg)  BMI 26.29 kg/m2  Physical Examination: General: Well developed, well nourished,  NAD HEENT: OP clear, mucus membranes moist SKIN: warm, dry. No rashes. Neuro: No focal deficits Musculoskeletal: Muscle strength 5/5 all ext Psychiatric: Mood and affect normal Neck: No JVD, no carotid bruits, no thyromegaly, no lymphadenopathy. Lungs:Clear bilaterally, no wheezes, rhonci, crackles Cardiovascular: Brady Regular rhythm. No murmurs, gallops or rubs. Abdomen:Soft. Bowel sounds present. Non-tender.  Extremities: No lower extremity edema. Pulses are 2 + in the bilateral DP/PT.  Assessment and Plan:   1. Chest pain: Resolved. No further ischemic workup. Stress test normal last year.   2. PVCs: NO symptoms. Continue beta blocker  3. HTN: BP controlled

## 2012-05-01 NOTE — Patient Instructions (Addendum)
Your physician wants you to follow-up in:  12 months.  You will receive a reminder letter in the mail two months in advance. If you don't receive a letter, please call our office to schedule the follow-up appointment.   

## 2012-05-06 ENCOUNTER — Other Ambulatory Visit: Payer: Self-pay | Admitting: Cardiovascular Disease

## 2012-05-21 DIAGNOSIS — M810 Age-related osteoporosis without current pathological fracture: Secondary | ICD-10-CM | POA: Diagnosis not present

## 2012-05-21 DIAGNOSIS — M25549 Pain in joints of unspecified hand: Secondary | ICD-10-CM | POA: Diagnosis not present

## 2012-05-21 DIAGNOSIS — I10 Essential (primary) hypertension: Secondary | ICD-10-CM | POA: Diagnosis not present

## 2012-05-21 DIAGNOSIS — E782 Mixed hyperlipidemia: Secondary | ICD-10-CM | POA: Diagnosis not present

## 2012-06-24 DIAGNOSIS — B49 Unspecified mycosis: Secondary | ICD-10-CM | POA: Diagnosis not present

## 2012-06-24 DIAGNOSIS — M24549 Contracture, unspecified hand: Secondary | ICD-10-CM | POA: Diagnosis not present

## 2012-06-24 DIAGNOSIS — M25549 Pain in joints of unspecified hand: Secondary | ICD-10-CM | POA: Diagnosis not present

## 2012-06-26 ENCOUNTER — Ambulatory Visit (HOSPITAL_COMMUNITY)
Admission: RE | Admit: 2012-06-26 | Discharge: 2012-06-26 | Disposition: A | Discharge status: Home / Self Care | Payer: Medicare Other | Source: Ambulatory Visit | Attending: Family Medicine | Admitting: Family Medicine

## 2012-06-26 ENCOUNTER — Other Ambulatory Visit (HOSPITAL_COMMUNITY): Payer: Self-pay | Admitting: Family Medicine

## 2012-06-26 DIAGNOSIS — M79642 Pain in left hand: Secondary | ICD-10-CM

## 2012-06-26 DIAGNOSIS — M25549 Pain in joints of unspecified hand: Secondary | ICD-10-CM | POA: Insufficient documentation

## 2012-06-26 DIAGNOSIS — M79609 Pain in unspecified limb: Secondary | ICD-10-CM | POA: Diagnosis not present

## 2012-06-29 ENCOUNTER — Other Ambulatory Visit: Payer: Self-pay

## 2012-06-29 DIAGNOSIS — Z1231 Encounter for screening mammogram for malignant neoplasm of breast: Secondary | ICD-10-CM

## 2012-07-03 ENCOUNTER — Emergency Department (HOSPITAL_COMMUNITY): Payer: Medicare Other

## 2012-07-03 ENCOUNTER — Encounter (HOSPITAL_COMMUNITY): Payer: Self-pay | Admitting: *Deleted

## 2012-07-03 ENCOUNTER — Emergency Department (HOSPITAL_COMMUNITY)
Admission: EM | Admit: 2012-07-03 | Discharge: 2012-07-03 | Disposition: A | Discharge status: Home / Self Care | Payer: Medicare Other | Attending: Emergency Medicine | Admitting: Emergency Medicine

## 2012-07-03 DIAGNOSIS — I10 Essential (primary) hypertension: Secondary | ICD-10-CM | POA: Diagnosis not present

## 2012-07-03 DIAGNOSIS — Z79899 Other long term (current) drug therapy: Secondary | ICD-10-CM | POA: Diagnosis not present

## 2012-07-03 DIAGNOSIS — E785 Hyperlipidemia, unspecified: Secondary | ICD-10-CM | POA: Insufficient documentation

## 2012-07-03 DIAGNOSIS — I491 Atrial premature depolarization: Secondary | ICD-10-CM | POA: Diagnosis not present

## 2012-07-03 DIAGNOSIS — M542 Cervicalgia: Secondary | ICD-10-CM | POA: Diagnosis not present

## 2012-07-03 DIAGNOSIS — I498 Other specified cardiac arrhythmias: Secondary | ICD-10-CM | POA: Insufficient documentation

## 2012-07-03 DIAGNOSIS — M4802 Spinal stenosis, cervical region: Secondary | ICD-10-CM | POA: Diagnosis not present

## 2012-07-03 DIAGNOSIS — M47812 Spondylosis without myelopathy or radiculopathy, cervical region: Secondary | ICD-10-CM | POA: Diagnosis not present

## 2012-07-03 MED ORDER — IBUPROFEN 200 MG PO TABS
600.0000 mg | ORAL_TABLET | Freq: Once | ORAL | Status: AC
  Administered 2012-07-03: 600 mg via ORAL
  Filled 2012-07-03: qty 3

## 2012-07-03 MED ORDER — IBUPROFEN 600 MG PO TABS: 600.0000 mg | ORAL_TABLET | Freq: Four times a day (QID) | ORAL | Status: DC | PRN

## 2012-07-03 MED ORDER — METHOCARBAMOL 500 MG PO TABS: 500.0000 mg | ORAL_TABLET | Freq: Two times a day (BID) | ORAL | Status: DC

## 2012-07-03 MED ORDER — DIAZEPAM 2 MG PO TABS
2.0000 mg | ORAL_TABLET | Freq: Once | ORAL | Status: AC
  Administered 2012-07-03: 2 mg via ORAL
  Filled 2012-07-03: qty 1

## 2012-07-03 NOTE — ED Notes (Signed)
Pt reports onset of right sided neck pain that only bothers her at night. Has been going on for 4 days now. Denies injury. Pain does not radiate

## 2012-07-03 NOTE — ED Provider Notes (Signed)
History     CSN: [id number]  Arrival date & time 07/03/12  4098   First MD Initiated Contact with Patient 07/03/12 1000      Chief Complaint  Patient presents with  . Neck Pain    (Consider location/radiation/quality/duration/timing/severity/associated sxs/prior treatment) Patient is a 67 y.o. female presenting with neck pain. The history is provided by the patient and the spouse.  Neck Pain Pain location:  L side Quality:  Burning Pain radiates to:  Does not radiate Pain severity:  Moderate Pain is:  Worse during the night Onset quality:  Gradual Duration:  4 days Timing:  Intermittent Progression:  Waxing and waning Chronicity:  New Context: not recent injury   Relieved by:  Analgesics Worsened by:  Position Associated symptoms: no headaches, no numbness and no paresis     Past Medical History  Diagnosis Date  . Chest pain   . Hyperlipidemia   . Hypertension   . SVT (supraventricular tachycardia)   . PAC (premature atrial contraction)     Past Surgical History  Procedure Laterality Date  . None      No family history on file.  History  Substance Use Topics  . Smoking status: Never Smoker   . Smokeless tobacco: Not on file  . Alcohol Use: No    OB History   Grav Para Term Preterm Abortions TAB SAB Ect Mult Living                  Review of Systems  HENT: Positive for neck pain.   Neurological: Negative for numbness and headaches.  All other systems reviewed and are negative.    Allergies  Review of patient's allergies indicates no known allergies.  Home Medications   Current Outpatient Rx  Name  Route  Sig  Dispense  Refill  . alendronate (FOSAMAX) 70 MG tablet   Oral   Take 70 mg by mouth every 7 (seven) days. Take with a full glass of water on an empty stomach.         . calcium-vitamin D (OSCAL WITH D) 500-200 MG-UNIT per tablet   Oral   Take 1 tablet by mouth 2 (two) times daily.         . pravastatin (PRAVACHOL) 40 MG  tablet   Oral   Take 1 tablet (40 mg total) by mouth daily.   30 tablet   11   . metoprolol tartrate (LOPRESSOR) 25 MG tablet      TAKE 1 TABLET BY MOUTH TWICE A DAY   180 tablet   3   . nitroGLYCERIN (NITROSTAT) 0.4 MG SL tablet   Sublingual   Place 1 tablet (0.4 mg total) under the tongue every 5 (five) minutes as needed for chest pain.   25 tablet   prn     BP 142/70  Pulse 60  Temp(Src) 98.5 F (36.9 C) (Oral)  Resp 16  SpO2 98%  Physical Exam  Nursing note and vitals reviewed. Constitutional: She is oriented to person, place, and time. She appears well-developed and well-nourished.  Non-toxic appearance. No distress.  HENT:  Head: Normocephalic and atraumatic.    Eyes: Conjunctivae, EOM and lids are normal. Pupils are equal, round, and reactive to light.  Neck: Normal range of motion. Neck supple. No tracheal deviation present. No mass present.  Cardiovascular: Normal rate, regular rhythm and normal heart sounds.  Exam reveals no gallop.   No murmur heard. Pulmonary/Chest: Effort normal and breath sounds normal. No stridor. No  respiratory distress. She has no decreased breath sounds. She has no wheezes. She has no rhonchi. She has no rales.  Abdominal: Soft. Normal appearance and bowel sounds are normal. She exhibits no distension. There is no tenderness. There is no rebound and no CVA tenderness.  Musculoskeletal: Normal range of motion. She exhibits no edema and no tenderness.  Neurological: She is alert and oriented to person, place, and time. She has normal strength. No cranial nerve deficit or sensory deficit. GCS eye subscore is 4. GCS verbal subscore is 5. GCS motor subscore is 6.  Skin: Skin is warm and dry. No abrasion and no rash noted.  Psychiatric: She has a normal mood and affect. Her speech is normal and behavior is normal.    ED Course  Procedures (including critical care time)  Labs Reviewed - No data to display No results found.   No  diagnosis found.    MDM  Pt given meds for her neck pain and she feels better.  No concern for acs   Date: 07/03/2012  Rate: 55  Rhythm: normal sinus rhythm  QRS Axis: normal  Intervals: normal  ST/T Wave abnormalities: normal  Conduction Disutrbances:none  Narrative Interpretation:   Old EKG Reviewed: none available       Toy Baker, MD 07/03/12 1220

## 2012-07-13 DIAGNOSIS — M25549 Pain in joints of unspecified hand: Secondary | ICD-10-CM | POA: Diagnosis not present

## 2012-07-13 DIAGNOSIS — M25579 Pain in unspecified ankle and joints of unspecified foot: Secondary | ICD-10-CM | POA: Diagnosis not present

## 2012-08-03 ENCOUNTER — Ambulatory Visit: Payer: Medicare Other

## 2012-08-20 ENCOUNTER — Ambulatory Visit: Payer: Medicare Other

## 2012-09-01 ENCOUNTER — Ambulatory Visit
Admission: RE | Admit: 2012-09-01 | Discharge: 2012-09-01 | Disposition: A | Discharge status: Home / Self Care | Payer: Medicare Other | Source: Ambulatory Visit

## 2012-09-01 ENCOUNTER — Other Ambulatory Visit: Payer: Self-pay

## 2012-09-01 ENCOUNTER — Ambulatory Visit: Payer: Medicare Other

## 2012-09-01 DIAGNOSIS — Z1231 Encounter for screening mammogram for malignant neoplasm of breast: Secondary | ICD-10-CM

## 2012-09-16 DIAGNOSIS — R05 Cough: Secondary | ICD-10-CM | POA: Diagnosis not present

## 2012-09-16 DIAGNOSIS — M25579 Pain in unspecified ankle and joints of unspecified foot: Secondary | ICD-10-CM | POA: Diagnosis not present

## 2012-09-16 DIAGNOSIS — L84 Corns and callosities: Secondary | ICD-10-CM | POA: Diagnosis not present

## 2012-10-12 DIAGNOSIS — M2010 Hallux valgus (acquired), unspecified foot: Secondary | ICD-10-CM | POA: Diagnosis not present

## 2012-10-12 DIAGNOSIS — D2370 Other benign neoplasm of skin of unspecified lower limb, including hip: Secondary | ICD-10-CM | POA: Diagnosis not present

## 2012-10-12 DIAGNOSIS — M79609 Pain in unspecified limb: Secondary | ICD-10-CM | POA: Diagnosis not present

## 2012-10-12 DIAGNOSIS — M659 Synovitis and tenosynovitis, unspecified: Secondary | ICD-10-CM | POA: Diagnosis not present

## 2012-10-12 DIAGNOSIS — M65979 Unspecified synovitis and tenosynovitis, unspecified ankle and foot: Secondary | ICD-10-CM | POA: Diagnosis not present

## 2012-10-26 DIAGNOSIS — D2370 Other benign neoplasm of skin of unspecified lower limb, including hip: Secondary | ICD-10-CM | POA: Diagnosis not present

## 2012-11-23 DIAGNOSIS — D2370 Other benign neoplasm of skin of unspecified lower limb, including hip: Secondary | ICD-10-CM | POA: Diagnosis not present

## 2012-12-04 ENCOUNTER — Ambulatory Visit: Payer: Medicare Other | Admitting: *Deleted

## 2012-12-04 VITALS — BP 140/80 | HR 62 | Ht 62.0 in | Wt 149.2 lb

## 2012-12-04 MED ORDER — PRAVASTATIN SODIUM 40 MG PO TABS: 40.0000 mg | ORAL_TABLET | Freq: Every day | ORAL | Status: DC

## 2012-12-04 MED ORDER — METOPROLOL TARTRATE 25 MG PO TABS: 12.5000 mg | ORAL_TABLET | Freq: Two times a day (BID) | ORAL | Status: DC

## 2012-12-04 NOTE — Progress Notes (Signed)
PT  WALKED IN  TO  CLINIC   C/O   NOT  FEELING  WELL  CALL MADE  TO INTERPRETER  AS PT  SPEAKS  [place]  THRU  TRANSLATOR  PT  HAS  COMPLAINED WITH SOB  FOR  2-3  WEEKS NO OTHER   COMPLAINTS   HAD  RUN OUT OF LOPRESSOR   REFILLS  CALLED   IN DISCUSSED WITH DR Jens Som  PT  NEEDS  NEXT  AVAILABLE  APPT  WITH  DR Clifton James  OR  PA  APPT  MADE WITH SCOTT WEAVER FOR  12-09-12  AT 11:30

## 2012-12-09 ENCOUNTER — Encounter: Payer: Self-pay | Admitting: Physician Assistant

## 2012-12-09 ENCOUNTER — Ambulatory Visit (INDEPENDENT_AMBULATORY_CARE_PROVIDER_SITE_OTHER): Payer: Medicare Other | Admitting: Physician Assistant

## 2012-12-09 VITALS — BP 102/78 | HR 75 | Ht 62.0 in | Wt 150.0 lb

## 2012-12-09 DIAGNOSIS — E785 Hyperlipidemia, unspecified: Secondary | ICD-10-CM | POA: Diagnosis not present

## 2012-12-09 DIAGNOSIS — R002 Palpitations: Secondary | ICD-10-CM | POA: Diagnosis not present

## 2012-12-09 DIAGNOSIS — R5381 Other malaise: Secondary | ICD-10-CM | POA: Diagnosis not present

## 2012-12-09 DIAGNOSIS — R5383 Other fatigue: Secondary | ICD-10-CM

## 2012-12-09 DIAGNOSIS — R0602 Shortness of breath: Secondary | ICD-10-CM

## 2012-12-09 DIAGNOSIS — I10 Essential (primary) hypertension: Secondary | ICD-10-CM | POA: Diagnosis not present

## 2012-12-09 LAB — CBC WITH DIFFERENTIAL/PLATELET
Basophils Absolute: 0 10*3/uL (ref 0.0–0.1)
Eosinophils Relative: 2.8 % (ref 0.0–5.0)
HCT: 42.6 % (ref 36.0–46.0)
Hemoglobin: 14.4 g/dL (ref 12.0–15.0)
Lymphocytes Relative: 25.9 % (ref 12.0–46.0)
Lymphs Abs: 1.6 10*3/uL (ref 0.7–4.0)
Monocytes Relative: 6.3 % (ref 3.0–12.0)
Neutro Abs: 4.1 10*3/uL (ref 1.4–7.7)
RBC: 4.92 Mil/uL (ref 3.87–5.11)
WBC: 6.3 10*3/uL (ref 4.5–10.5)

## 2012-12-09 LAB — BASIC METABOLIC PANEL
Calcium: 9.4 mg/dL (ref 8.4–10.5)
GFR: 71.18 mL/min (ref >60.00)
Potassium: 3.9 mEq/L (ref 3.5–5.1)
Sodium: 139 mEq/L (ref 135–145)

## 2012-12-09 MED ORDER — DILTIAZEM HCL 30 MG PO TABS: 30.0000 mg | ORAL_TABLET | Freq: Every day | ORAL | Status: DC | PRN

## 2012-12-09 MED ORDER — METOPROLOL TARTRATE 25 MG PO TABS: 12.5000 mg | ORAL_TABLET | Freq: Every day | ORAL | Status: DC

## 2012-12-09 NOTE — Patient Instructions (Addendum)
Decrease Metoprolol to 12.5 mg once daily for 3 days, then stop.   Start taking Diltiazem 30 mg once daily as needed for heart palpitations.   Call if palpitations are getting worse and not responding to the Diltiazem.  LABS TODAY; BMET, CBC W/DIFF, TSH, BNP  PLEASE FOLLOW UP WITH DR. Clifton James IN 3-4 WEEKS; IF NOT AVAILABLE THEN WITH Nyzier Boivin, PAC SAME DAY DR. Mike Craze IS IN THE OFFICE.

## 2012-12-09 NOTE — Progress Notes (Signed)
[address] [place], [place]  [address] Phone: [phone] Fax:  [phone]  Date:  12/09/2012   ID:  Mikki Santee [place], [place] 05-20-1945, MRN [id number]  PCP:  Quitman Livings, MD  Cardiologist:  Dr. Verne Carrow     History of Present Illness: Ariel Harris is a 67 y.o. female with a hx of HTN, HL, chest pain.  Echo (08/2009): EF 60-65%, normal wall thickness, normal diastolic function.  She was admitted in 10/2009 with CP and ruled out with serial enzymes.  Myoview (03/2011):  Normal study, apical thinning, EF 74%.  She underwent a Holter monitor in 03/2011 for palpitations: NSR, PACs, short runs of SVT. She was placed on Toprol. Toprol has been reduced due to complaints of fatigue. Last seen by Dr. Verne Carrow 04/2012.    She comes in today with the interpreter.  She notes fatigue for the last 1 month.  She has decreased exercise tolerance. She also notes DOE.  She is NYHA Class II-IIb.  No orthopnea, PND, edema.  She only has chest pain if she gets "angry or upset" about something.  She denies exertional chest pain.    Recent Labs: 12/18/2011: Creatinine 0.8; Hemoglobin 15.0; Potassium 3.8; TSH 1.05   Wt Readings from Last 3 Encounters:  12/09/12 150 lb (68.04 kg)  12/04/12 149 lb 3.2 oz (67.677 kg)  05/01/12 148 lb 6.4 oz (67.314 kg)     Past Medical History  Diagnosis Date  . Chest pain   . Hyperlipidemia   . Hypertension   . SVT (supraventricular tachycardia)   . PAC (premature atrial contraction)     Current Outpatient Prescriptions  Medication Sig Dispense Refill  . alendronate (FOSAMAX) 70 MG tablet Take 70 mg by mouth every 7 (seven) days. Take with a full glass of water on an empty stomach.      . calcium-vitamin D (OSCAL WITH D) 500-200 MG-UNIT per tablet Take 1 tablet by mouth 2 (two) times daily.      Marland Kitchen ibuprofen (ADVIL,MOTRIN) 600 MG tablet Take 1 tablet (600 mg total) by mouth every 6 (six) hours as needed for pain.  30 tablet  0  .  methocarbamol (ROBAXIN) 500 MG tablet Take 1 tablet (500 mg total) by mouth 2 (two) times daily.  20 tablet  0  . metoprolol tartrate (LOPRESSOR) 25 MG tablet Take 0.5 tablets (12.5 mg total) by mouth 2 (two) times daily.  90 tablet  3  . nitroGLYCERIN (NITROSTAT) 0.4 MG SL tablet Place 1 tablet (0.4 mg total) under the tongue every 5 (five) minutes as needed for chest pain.  25 tablet  prn  . pravastatin (PRAVACHOL) 40 MG tablet Take 1 tablet (40 mg total) by mouth daily.  30 tablet  11   No current facility-administered medications for this visit.    Allergies:   Review of patient's allergies indicates no known allergies.   Social History:  The patient  reports that she has never smoked. She does not have any smokeless tobacco history on file. She reports that she does not drink alcohol or use illicit drugs.   Family History:  The patient's family history is not on file.   ROS:  Please see the history of present illness.   She does have a hx of snoring.   All other systems reviewed and negative.   PHYSICAL EXAM: VS:  BP 102/78  Pulse 75  Ht 5\' 2"  (1.575 m)  Wt 150 lb (68.04 kg)  BMI 27.43 kg/m2 Well nourished, well developed, in no acute distress HEENT: normal Neck: no JVD Cardiac:  normal S1, S2; RRR; no murmur Lungs:  clear to auscultation bilaterally, no wheezing, rhonchi or rales Abd: soft, nontender, no hepatomegaly Ext: no edema Skin: warm and dry Neuro:  CNs 2-12 intact, no focal abnormalities noted  EKG:  NSR, HR 75, normal axis, no acute changes     ASSESSMENT AND PLAN:  1. Fatigue:  Etiology not clear.  She has had her beta blocker reduced in the past due to fatigue.  Question if symptoms are related to beta blocker Rx.  I will taper her off of this.  I will give her Diltiazem 30 mg QD prn palpitations.  If she needs to use this frequently, consider placing her on a dose of 30 mg bid.  Check a BMET, CBC, TSH, BNP.  She does have a hx of snoring.  Consider sleep testing  in the future.  2. Dyspnea:  Etiology not clear.  Check BNP.  If abnormal, obtain echocardiogram.   3. Palpitations:  Previously controlled with beta blocker.  This is being stopped as noted.  Add short acting CCB daily prn.  4. Hypertension:  Controlled.  5. Hyperlipidemia:  Continue statin.  6. Disposition:  F/u with me or Dr. Verne Carrow in 4 weeks.   Signed, Tereso Newcomer, PA-C  12/09/2012 12:02 PM

## 2012-12-11 ENCOUNTER — Encounter: Payer: Self-pay | Admitting: *Deleted

## 2012-12-11 ENCOUNTER — Telehealth: Payer: Self-pay | Admitting: *Deleted

## 2012-12-11 NOTE — Telephone Encounter (Signed)
tried to let pt know her lab results due to language barrier pt did not understand and then she put a man on the phone but he did not understand either. I will send rslt ltr.

## 2012-12-14 DIAGNOSIS — D2370 Other benign neoplasm of skin of unspecified lower limb, including hip: Secondary | ICD-10-CM | POA: Diagnosis not present

## 2013-01-05 ENCOUNTER — Encounter: Payer: Self-pay | Admitting: Physician Assistant

## 2013-01-05 ENCOUNTER — Ambulatory Visit (INDEPENDENT_AMBULATORY_CARE_PROVIDER_SITE_OTHER): Payer: Medicare Other | Admitting: Physician Assistant

## 2013-01-05 VITALS — BP 120/86 | HR 72 | Ht 65.0 in | Wt 150.0 lb

## 2013-01-05 DIAGNOSIS — R5381 Other malaise: Secondary | ICD-10-CM | POA: Diagnosis not present

## 2013-01-05 DIAGNOSIS — E785 Hyperlipidemia, unspecified: Secondary | ICD-10-CM | POA: Diagnosis not present

## 2013-01-05 DIAGNOSIS — R002 Palpitations: Secondary | ICD-10-CM

## 2013-01-05 DIAGNOSIS — I10 Essential (primary) hypertension: Secondary | ICD-10-CM

## 2013-01-05 DIAGNOSIS — R5383 Other fatigue: Secondary | ICD-10-CM

## 2013-01-05 NOTE — Progress Notes (Signed)
[address] [place], [place]  [address] Phone: [phone] Fax:  [phone]  Date:  01/05/2013   ID:  Ariel Harris, [place] August 14, 1945, MRN [id number]  PCP:  Quitman Livings, MD  Cardiologist:  Dr. Verne Carrow     History of Present Illness: Ariel Harris is a 67 y.o. female with a hx of HTN, HL, chest pain.  Echo (08/2009): EF 60-65%, normal wall thickness, normal diastolic function.  She was admitted in 10/2009 with CP and ruled out with serial enzymes.  Myoview (03/2011):  Normal study, apical thinning, EF 74%.  She underwent a Holter monitor in 03/2011 for palpitations: NSR, PACs, short runs of SVT. She was placed on Toprol. Toprol has been reduced due to complaints of fatigue.   I saw her last month with complaints of fatigue.  I changed her from beta blocker to calcium channel blocker.  BMET, CBC, TSH, BNP were all normal.  She continues to feel fatigued at times.  Denies significant DOE.  She is NYHA Class 2.  Denies chest pain with exertion.  No syncope. No orthopnea, PND, edema.    Recent Labs: 12/09/2012: Creatinine 0.9; Hemoglobin 14.4; Potassium 3.9; Pro B Natriuretic peptide (BNP) 56.0; TSH 0.65   Wt Readings from Last 3 Encounters:  01/05/13 150 lb (68.04 kg)  12/09/12 150 lb (68.04 kg)  12/04/12 149 lb 3.2 oz (67.677 kg)     Past Medical History  Diagnosis Date  . Chest pain   . Hyperlipidemia   . Hypertension   . SVT (supraventricular tachycardia)   . PAC (premature atrial contraction)     Current Outpatient Prescriptions  Medication Sig Dispense Refill  . alendronate (FOSAMAX) 70 MG tablet Take 70 mg by mouth every 7 (seven) days. Take with a full glass of water on an empty stomach.      . calcium-vitamin D (OSCAL WITH D) 500-200 MG-UNIT per tablet Take 1 tablet by mouth 2 (two) times daily.      Marland Kitchen diltiazem (CARDIZEM) 30 MG tablet Take 1 tablet (30 mg total) by mouth daily as needed (for heart palpitations).  30 tablet  1  . ibuprofen  (ADVIL,MOTRIN) 600 MG tablet Take 1 tablet (600 mg total) by mouth every 6 (six) hours as needed for pain.  30 tablet  0  . methocarbamol (ROBAXIN) 500 MG tablet Take 1 tablet (500 mg total) by mouth 2 (two) times daily.  20 tablet  0  . nitroGLYCERIN (NITROSTAT) 0.4 MG SL tablet Place 1 tablet (0.4 mg total) under the tongue every 5 (five) minutes as needed for chest pain.  25 tablet  prn  . pravastatin (PRAVACHOL) 40 MG tablet Take 1 tablet (40 mg total) by mouth daily.  30 tablet  11   No current facility-administered medications for this visit.    Allergies:   Review of patient's allergies indicates no known allergies.   Social History:  The patient  reports that she has never smoked. She does not have any smokeless tobacco history on file. She reports that she does not drink alcohol or use illicit drugs.   Family History:  The patient's family history is not on file.   ROS:  Please see the history of present illness.  She does snore.  She denies daytime hypersomnolence or witnessed apneic episodes.  All other systems reviewed and negative.   PHYSICAL EXAM: VS:  BP 120/86  Pulse 72  Ht 5\' 5"  (1.651 m)  Wt 150 lb (  68.04 kg)  BMI 24.96 kg/m2 Well nourished, well developed, in no acute distress HEENT: normal Neck: no JVD Cardiac:  normal S1, S2; RRR; no murmur Lungs:  clear to auscultation bilaterally, no wheezing, rhonchi or rales Abd: soft, nontender, no hepatomegaly Ext: no edema Skin: warm and dry Neuro:  CNs 2-12 intact, no focal abnormalities noted  EKG:  NSR, HR 72, normal axis, no acute changes     ASSESSMENT AND PLAN:  1. Fatigue:  Etiology not clear.  She did not seem to notice a change with changing from a beta blocker to CCB.  Lab workup was unremarkable.  She really does not have significant symptoms for OSA.  Recent myoview low risk.  She is not really having symptoms to suggest ischemia.  ECG is normal.  No further cardiac workup.  Recommend f/u with PCP.   2. Palpitations:  Stable.  Continue current Rx. 3. Hypertension:  Controlled.  4. Hyperlipidemia:  Continue statin.  5. Disposition:  F/u with Dr. Verne Carrow in 6 mos.  Signed, Tereso Newcomer, PA-C  01/05/2013 9:50 AM

## 2013-01-05 NOTE — Patient Instructions (Addendum)
Your physician assistant recommends that you schedule a follow-up appointment in: 6 months with Dr. Verne Carrow.   NO CHANGES WERE MADE

## 2013-01-06 ENCOUNTER — Other Ambulatory Visit: Payer: Self-pay | Admitting: *Deleted

## 2013-01-06 DIAGNOSIS — R002 Palpitations: Secondary | ICD-10-CM

## 2013-01-06 MED ORDER — PRAVASTATIN SODIUM 40 MG PO TABS: 40.0000 mg | ORAL_TABLET | Freq: Every day | ORAL | Status: DC

## 2013-01-06 MED ORDER — DILTIAZEM HCL 30 MG PO TABS: 30.0000 mg | ORAL_TABLET | Freq: Every day | ORAL | Status: DC | PRN

## 2013-03-03 ENCOUNTER — Ambulatory Visit (INDEPENDENT_AMBULATORY_CARE_PROVIDER_SITE_OTHER): Payer: Medicare Other | Admitting: Family Medicine

## 2013-03-03 VITALS — BP 118/64 | HR 72 | Temp 98.4°F | Resp 17 | Ht 64.0 in | Wt 153.0 lb

## 2013-03-03 DIAGNOSIS — E785 Hyperlipidemia, unspecified: Secondary | ICD-10-CM | POA: Diagnosis not present

## 2013-03-03 DIAGNOSIS — R002 Palpitations: Secondary | ICD-10-CM | POA: Diagnosis not present

## 2013-03-03 DIAGNOSIS — M810 Age-related osteoporosis without current pathological fracture: Secondary | ICD-10-CM | POA: Diagnosis not present

## 2013-03-03 DIAGNOSIS — R52 Pain, unspecified: Secondary | ICD-10-CM

## 2013-03-03 LAB — LIPID PANEL
CHOL/HDL RATIO: 3 ratio
Cholesterol: 208 mg/dL — ABNORMAL HIGH (ref 0–200)
HDL: 70 mg/dL (ref >39)
LDL Cholesterol: 123 mg/dL — ABNORMAL HIGH (ref 0–99)
Triglycerides: 75 mg/dL (ref <150)
VLDL: 15 mg/dL (ref 0–40)

## 2013-03-03 LAB — COMPREHENSIVE METABOLIC PANEL
ALBUMIN: 4.4 g/dL (ref 3.5–5.2)
ALK PHOS: 33 U/L — AB (ref 39–117)
ALT: 17 U/L (ref 0–35)
AST: 22 U/L (ref 0–37)
BUN: 20 mg/dL (ref 6–23)
CO2: 28 mEq/L (ref 19–32)
Calcium: 9.2 mg/dL (ref 8.4–10.5)
Chloride: 103 mEq/L (ref 96–112)
Creat: 0.8 mg/dL (ref 0.50–1.10)
GLUCOSE: 89 mg/dL (ref 70–99)
POTASSIUM: 4 meq/L (ref 3.5–5.3)
SODIUM: 139 meq/L (ref 135–145)
TOTAL PROTEIN: 7.6 g/dL (ref 6.0–8.3)
Total Bilirubin: 1 mg/dL (ref 0.2–1.2)

## 2013-03-03 MED ORDER — PRAVASTATIN SODIUM 40 MG PO TABS: 40.0000 mg | ORAL_TABLET | Freq: Every day | ORAL | Status: DC

## 2013-03-03 MED ORDER — IBUPROFEN 600 MG PO TABS: 600.0000 mg | ORAL_TABLET | Freq: Four times a day (QID) | ORAL | Status: DC | PRN

## 2013-03-03 MED ORDER — ALENDRONATE SODIUM 70 MG PO TABS: 70.0000 mg | ORAL_TABLET | ORAL | Status: DC

## 2013-03-03 MED ORDER — DILTIAZEM HCL 30 MG PO TABS: 30.0000 mg | ORAL_TABLET | Freq: Every day | ORAL | Status: DC | PRN

## 2013-03-03 NOTE — Patient Instructions (Signed)
I only work in the walk- in clinic and do not take any appointments. Return to see me in 6 months. You will need to call and find out what days I will be in the office, and come in one day that I am here. Sometimes she will get seen quickly, sometimes there'll be a long wait. It depends on what patient's we're seeing.  Your medications were prescribed to your pharmacy. Continue the same medications.  I will let you know the results of your laboratory tests  Return sooner if problems arise.

## 2013-03-03 NOTE — Progress Notes (Signed)
Subjective: Patient is here for her first time. She came in with her husband not seen in the past. She needs refills on her prescriptions. She's been generally doing well. Review of systems fairly unremarkable. She has some aches and pains. She has a history of osteoporosis and is on medication for that. She takes diltiazem for palpitations and is on a statin. No major complaints otherwise.  Objective: Pleasant lady in no acute distress. TMs normal. Throat clear. Neck supple without nodes thyromegaly. No carotid bruits. Chest clear to auscultation. Heart regular without murmurs. And soft without mass or tenderness. She did not have many palpitations or ectopy today. Her abdomen soft without mass or tenderness. Extremities without edema. She doesn't speak a lot of English, so her husband her before.  Assessment: Palpitations History of hyperlipidemia Osteoporosis Nonspecific arthralgias  Plan: See instructions

## 2013-03-05 ENCOUNTER — Other Ambulatory Visit: Payer: Self-pay

## 2013-03-05 DIAGNOSIS — Z1231 Encounter for screening mammogram for malignant neoplasm of breast: Secondary | ICD-10-CM

## 2013-03-24 DIAGNOSIS — B079 Viral wart, unspecified: Secondary | ICD-10-CM | POA: Diagnosis not present

## 2013-04-16 ENCOUNTER — Other Ambulatory Visit: Payer: Self-pay | Admitting: Podiatry

## 2013-04-16 DIAGNOSIS — B079 Viral wart, unspecified: Secondary | ICD-10-CM | POA: Diagnosis not present

## 2013-04-16 DIAGNOSIS — D485 Neoplasm of uncertain behavior of skin: Secondary | ICD-10-CM | POA: Diagnosis not present

## 2013-04-26 DIAGNOSIS — B079 Viral wart, unspecified: Secondary | ICD-10-CM | POA: Diagnosis not present

## 2013-05-13 DIAGNOSIS — B351 Tinea unguium: Secondary | ICD-10-CM | POA: Diagnosis not present

## 2013-05-13 DIAGNOSIS — D2370 Other benign neoplasm of skin of unspecified lower limb, including hip: Secondary | ICD-10-CM | POA: Diagnosis not present

## 2013-05-13 DIAGNOSIS — M79609 Pain in unspecified limb: Secondary | ICD-10-CM | POA: Diagnosis not present

## 2013-05-28 DIAGNOSIS — B079 Viral wart, unspecified: Secondary | ICD-10-CM | POA: Diagnosis not present

## 2013-06-11 DIAGNOSIS — B351 Tinea unguium: Secondary | ICD-10-CM | POA: Diagnosis not present

## 2013-06-11 DIAGNOSIS — M79609 Pain in unspecified limb: Secondary | ICD-10-CM | POA: Diagnosis not present

## 2013-06-22 ENCOUNTER — Encounter: Payer: Self-pay | Admitting: Cardiovascular Disease

## 2013-06-22 ENCOUNTER — Ambulatory Visit (INDEPENDENT_AMBULATORY_CARE_PROVIDER_SITE_OTHER): Payer: Medicare Other | Admitting: Cardiovascular Disease

## 2013-06-22 VITALS — BP 130/70 | HR 72 | Ht 64.0 in | Wt 149.0 lb

## 2013-06-22 DIAGNOSIS — I471 Supraventricular tachycardia: Secondary | ICD-10-CM

## 2013-06-22 DIAGNOSIS — I10 Essential (primary) hypertension: Secondary | ICD-10-CM

## 2013-06-22 DIAGNOSIS — I498 Other specified cardiac arrhythmias: Secondary | ICD-10-CM

## 2013-06-22 NOTE — Patient Instructions (Signed)
Your physician wants you to follow-up in:  12 months.  You will receive a reminder letter in the mail two months in advance. If you don't receive a letter, please call our office to schedule the follow-up appointment.  The number for the [facility] at [facility] is [phone].  You can also go to the [facility] at [facility].

## 2013-06-22 NOTE — Progress Notes (Signed)
History of Present Illness: 68 yo [place] female with history of HTN, HLD here today for cardiac follow up. She was admitted October 2011 to [facility] with chest pain and ruled out with serial enzymes. She was then seen in [facility] and had c/o palpitations. She occasionally feels her heart skip beats. Breathing has been ok. I ordered a 48 hour Holter monitor which showed sinus rhythm with PACs and shorts runs of SVT. I started Toprol. She then described chest tightness. I arranged a stress myoview on 04/10/11 which showed no ischemia with normal LV function. She has tolerated Toprol.     She is here today for planned f/u. An interpretor is here with her. She reports no chest pain or SOB. She is feeling well. No palpitations. Some fatigue.    Primary Care Physician: None  Last Lipid Profile:Lipid Panel     Component Value Date/Time   CHOL 208* 03/03/2013 1025   TRIG 75 03/03/2013 1025   HDL 70 03/03/2013 1025   CHOLHDL 3.0 03/03/2013 1025   VLDL 15 03/03/2013 1025   LDLCALC 123* 03/03/2013 1025     Past Medical History  Diagnosis Date  . Chest pain   . Hyperlipidemia   . Hypertension   . SVT (supraventricular tachycardia)   . PAC (premature atrial contraction)     Past Surgical History  Procedure Laterality Date  . None      Current Outpatient Prescriptions  Medication Sig Dispense Refill  . alendronate (FOSAMAX) 70 MG tablet Take 1 tablet (70 mg total) by mouth every 7 (seven) days. Take with a full glass of water on an empty stomach.  13 tablet  3  . calcium-vitamin D (OSCAL WITH D) 500-200 MG-UNIT per tablet Take 1 tablet by mouth 2 (two) times daily.      Marland Kitchen ibuprofen (ADVIL,MOTRIN) 600 MG tablet Take 1 tablet (600 mg total) by mouth every 6 (six) hours as needed.  30 tablet  3  . metoprolol succinate (TOPROL-XL) 25 MG 24 hr tablet Take 25 mg by mouth daily.      . nitroGLYCERIN (NITROSTAT) 0.4 MG SL tablet Place 1 tablet (0.4 mg total) under the tongue every 5 (five)  minutes as needed for chest pain.  25 tablet  prn  . pravastatin (PRAVACHOL) 40 MG tablet Take 1 tablet (40 mg total) by mouth daily.  90 tablet  3  . terbinafine (LAMISIL) 250 MG tablet Take 250 mg by mouth daily.       No current facility-administered medications for this visit.    No Known Allergies  History   Social History  . Marital Status: Divorced    Spouse Name: N/A    Number of Children: N/A  . Years of Education: N/A   Occupational History  . Not on file.   Social History Main Topics  . Smoking status: Never Smoker   . Smokeless tobacco: Not on file  . Alcohol Use: No  . Drug Use: No  . Sexual Activity: Not on file   Other Topics Concern  . Not on file   Social History Narrative   Negative family `history--Specifically artery disease or MI. The patient has two siblings died at a young age from fevers.   The patient is separated and single. She is retired but used to work at a [employer] and a [employer]. She is independent of daily living. She has never drink or smoke or done drugs    No family history on file.  Review of Systems:  As stated in the HPI and otherwise negative.   BP 130/70  Pulse 72  Ht 5\' 4"  (1.626 m)  Wt 149 lb (67.586 kg)  BMI 25.56 kg/m2  Physical Examination: General: Well developed, well nourished, NAD HEENT: OP clear, mucus membranes moist SKIN: warm, dry. No rashes. Neuro: No focal deficits Musculoskeletal: Muscle strength 5/5 all ext Psychiatric: Mood and affect normal Neck: No JVD, no carotid bruits, no thyromegaly, no lymphadenopathy. Lungs:Clear bilaterally, no wheezes, rhonci, crackles Cardiovascular: Brady Regular rhythm. No murmurs, gallops or rubs. Abdomen:Soft. Bowel sounds present. Non-tender.  Extremities: No lower extremity edema. Pulses are 2 + in the bilateral DP/PT.  Assessment and Plan:   1. PVCs/SVT: No symptoms. Continue Toprol.   3. HTN: BP controlled

## 2013-07-09 DIAGNOSIS — B351 Tinea unguium: Secondary | ICD-10-CM | POA: Diagnosis not present

## 2013-07-09 DIAGNOSIS — M79609 Pain in unspecified limb: Secondary | ICD-10-CM | POA: Diagnosis not present

## 2013-08-06 DIAGNOSIS — B351 Tinea unguium: Secondary | ICD-10-CM | POA: Diagnosis not present

## 2013-08-06 DIAGNOSIS — M79609 Pain in unspecified limb: Secondary | ICD-10-CM | POA: Diagnosis not present

## 2013-08-09 DIAGNOSIS — R5381 Other malaise: Secondary | ICD-10-CM | POA: Diagnosis not present

## 2013-08-09 DIAGNOSIS — I10 Essential (primary) hypertension: Secondary | ICD-10-CM | POA: Diagnosis not present

## 2013-08-09 DIAGNOSIS — R5383 Other fatigue: Secondary | ICD-10-CM | POA: Diagnosis not present

## 2013-08-09 DIAGNOSIS — Z79899 Other long term (current) drug therapy: Secondary | ICD-10-CM | POA: Diagnosis not present

## 2013-08-09 DIAGNOSIS — E7800 Pure hypercholesterolemia, unspecified: Secondary | ICD-10-CM | POA: Diagnosis not present

## 2013-09-02 ENCOUNTER — Ambulatory Visit
Admission: RE | Admit: 2013-09-02 | Discharge: 2013-09-02 | Disposition: A | Discharge status: Home / Self Care | Payer: Medicare Other | Source: Ambulatory Visit

## 2013-09-02 DIAGNOSIS — Z1231 Encounter for screening mammogram for malignant neoplasm of breast: Secondary | ICD-10-CM

## 2013-10-28 DIAGNOSIS — Z23 Encounter for immunization: Secondary | ICD-10-CM | POA: Diagnosis not present

## 2013-12-13 DIAGNOSIS — M792 Neuralgia and neuritis, unspecified: Secondary | ICD-10-CM | POA: Diagnosis not present

## 2013-12-13 DIAGNOSIS — Z13220 Encounter for screening for lipoid disorders: Secondary | ICD-10-CM | POA: Diagnosis not present

## 2013-12-13 DIAGNOSIS — Z0000 Encounter for general adult medical examination without abnormal findings: Secondary | ICD-10-CM | POA: Diagnosis not present

## 2013-12-13 DIAGNOSIS — R7309 Other abnormal glucose: Secondary | ICD-10-CM | POA: Diagnosis not present

## 2013-12-13 DIAGNOSIS — E559 Vitamin D deficiency, unspecified: Secondary | ICD-10-CM | POA: Diagnosis not present

## 2013-12-17 DIAGNOSIS — E559 Vitamin D deficiency, unspecified: Secondary | ICD-10-CM | POA: Diagnosis not present

## 2013-12-17 DIAGNOSIS — R7309 Other abnormal glucose: Secondary | ICD-10-CM | POA: Diagnosis not present

## 2013-12-17 DIAGNOSIS — I10 Essential (primary) hypertension: Secondary | ICD-10-CM | POA: Diagnosis not present

## 2014-01-12 ENCOUNTER — Ambulatory Visit (INDEPENDENT_AMBULATORY_CARE_PROVIDER_SITE_OTHER): Payer: Medicare Other | Admitting: *Deleted

## 2014-01-12 VITALS — BP 116/80 | HR 68 | Resp 16

## 2014-01-12 DIAGNOSIS — R0789 Other chest pain: Secondary | ICD-10-CM

## 2014-01-12 NOTE — Progress Notes (Signed)
Walk in patient C/o fatigue and chest pressure for last 3 days, describes that her heart feels like it is beating harder than normal. She speaks primarily vietnamese and normally comes to appointments with an interpretor.    VSS as noted. Reviewed medications.  Pt taking metoprolol tartrate 25 mg once daily; even tho her bottle says twice daily and med list says succinate 25 mg daily Discussed with flex DOD Molli Barrows, PA ), ok to add to schedule today. Patient requested appointment tomorrow because she was unable to wait until 11:30 open slot, also so that an interpretor could be present at the appointment.  Discussed with Truitt Merle, NP, ok to add to flex DOD schedule for tomorrow, as long as EKG today does not show anything acute. EKG done. Reviewed by Molli Barrows, PA, who states EKG is unchanged from last and it is not acute. Scheduled patient for tomorrow 8:30 am.  Evelina Bucy will be present also. Informed patient, and also informed that should symptoms worsen she should go to ED at any time, day or night. She verbalizes understanding and agreement.

## 2014-01-13 ENCOUNTER — Ambulatory Visit (INDEPENDENT_AMBULATORY_CARE_PROVIDER_SITE_OTHER): Payer: Medicare Other | Admitting: Cardiology

## 2014-01-13 ENCOUNTER — Encounter: Payer: Self-pay | Admitting: Cardiology

## 2014-01-13 VITALS — BP 118/70 | HR 54 | Ht 64.0 in | Wt 156.0 lb

## 2014-01-13 DIAGNOSIS — Z8679 Personal history of other diseases of the circulatory system: Secondary | ICD-10-CM

## 2014-01-13 DIAGNOSIS — R079 Chest pain, unspecified: Secondary | ICD-10-CM | POA: Diagnosis not present

## 2014-01-13 DIAGNOSIS — E785 Hyperlipidemia, unspecified: Secondary | ICD-10-CM

## 2014-01-13 NOTE — Assessment & Plan Note (Signed)
Pt seen as an add on for chest pain.

## 2014-01-13 NOTE — Assessment & Plan Note (Signed)
On statin.

## 2014-01-13 NOTE — Patient Instructions (Addendum)
Your physician recommends that you continue on your current medications as directed. Please refer to the Current Medication list given to you today.   Your physician has requested that you have en exercise stress myoview. For further information please visit [email or web address]. Please follow instruction sheet, as given. SCHEDULE SAME DAY AS FOLLOW UP APPOINTMENT   Your physician recommends that you schedule a follow-up appointment in: [place] INTERPRETER WITH THEM ON THAT APPOINTMENT DAY

## 2014-01-13 NOTE — Progress Notes (Signed)
01/13/2014 Ariel Harris   11-05-1945  [id number]  Primary Physician Antonietta Jewel, MD Primary Cardiologist: Dr Julianne Handler  HPI:  68 y/o [place] female with a history of PSVT, chest pain in the past with a low risk Myoview in 2013, and treated HLD. She has been treated with low dose beta blocker for PSVT. At one point she had fatigue though to be secondary to beta blocker but this apparently improved on a decreased dose. LOV was May 2015. She is in the office today for evaluation of chest pain. She walked in yesterday pm with complaints of chest pain. Her EKG was without acute changes and she was scheduled to come in today with an interpreter.            The pt describes SSCP "tightness" with exertion. It is relieved with rest. She has takes NTG as well with relief. She denies any associated nausea, vomiting, diaphoresis, or radiation to her arms, jaw or neck. EKG today shows NSR, SB, PAC.   Current Outpatient Prescriptions  Medication Sig Dispense Refill  . alendronate (FOSAMAX) 70 MG tablet Take 1 tablet (70 mg total) by mouth every 7 (seven) days. Take with a full glass of water on an empty stomach. 13 tablet 3  . calcium-vitamin D (OSCAL WITH D) 500-200 MG-UNIT per tablet Take 1 tablet by mouth 2 (two) times daily.    . metoprolol tartrate (LOPRESSOR) 25 MG tablet Take 25 mg by mouth daily.    . pravastatin (PRAVACHOL) 40 MG tablet Take 1 tablet (40 mg total) by mouth daily. 90 tablet 3  . nitroGLYCERIN (NITROSTAT) 0.4 MG SL tablet Place 1 tablet (0.4 mg total) under the tongue every 5 (five) minutes as needed for chest pain. (Patient not taking: Reported on 01/13/2014) 25 tablet prn   No current facility-administered medications for this visit.    No Known Allergies  History   Social History  . Marital Status: Divorced    Spouse Name: N/A    Number of Children: N/A  . Years of Education: N/A   Occupational History  . Not on file.   Social History Main Topics  . Smoking  status: Never Smoker   . Smokeless tobacco: Not on file  . Alcohol Use: No  . Drug Use: No  . Sexual Activity: Not on file   Other Topics Concern  . Not on file   Social History Narrative   Negative family `history--Specifically artery disease or MI. The patient has two siblings died at a young age from fevers.   The patient is separated and single. 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 She is independent of daily living. She has never drink or smoke or done drugs     Review of Systems: General: negative for chills, fever, night sweats or weight changes.  Cardiovascular: negative for dyspnea on exertion, edema, orthopnea, palpitations, paroxysmal nocturnal dyspnea or shortness of breath Dermatological: negative for rash Respiratory: negative for cough or wheezing Urologic: negative for hematuria Abdominal: negative for nausea, vomiting, diarrhea, bright red blood per rectum, melena, or hematemesis Neurologic: negative for visual changes, syncope, or dizziness All other systems reviewed and are otherwise negative except as noted above.    Blood pressure 118/70, pulse 54, height 5\' 4"  (1.626 m), weight 156 lb (70.761 kg).  General appearance: alert, cooperative and no distress Neck: no carotid bruit and no JVD Lungs: clear to auscultation bilaterally Heart: regular rate and rhythm Extremities: no  edema  EKG NSR, SB one PAC  ASSESSMENT AND PLAN:   Chest pain Pt seen as an add on for chest pain.  Dyslipidemia On statin  History of PSVT On low dose beta blocker    PLAN  I did not change her medications. I ordered an exercise Myoview to be done ASAP. She knows she should go to the ED if she has chest pain associated with diaphoresis, or chest pain not relived with NTG. She'll f/u with Dr Julianne Handler after her Myoview.   [name] KPA-C 01/13/2014 10:07 AM

## 2014-01-13 NOTE — Assessment & Plan Note (Signed)
On low dose beta blocker

## 2014-01-19 ENCOUNTER — Ambulatory Visit (HOSPITAL_COMMUNITY): Payer: Medicare Other | Attending: Cardiovascular Disease | Admitting: Radiology

## 2014-01-19 DIAGNOSIS — R5383 Other fatigue: Secondary | ICD-10-CM | POA: Insufficient documentation

## 2014-01-19 DIAGNOSIS — I10 Essential (primary) hypertension: Secondary | ICD-10-CM | POA: Diagnosis not present

## 2014-01-19 DIAGNOSIS — R0602 Shortness of breath: Secondary | ICD-10-CM | POA: Diagnosis not present

## 2014-01-19 DIAGNOSIS — Z6825 Body mass index (BMI) 25.0-25.9, adult: Secondary | ICD-10-CM | POA: Diagnosis not present

## 2014-01-19 DIAGNOSIS — I471 Supraventricular tachycardia: Secondary | ICD-10-CM

## 2014-01-19 DIAGNOSIS — R001 Bradycardia, unspecified: Secondary | ICD-10-CM | POA: Insufficient documentation

## 2014-01-19 DIAGNOSIS — Z136 Encounter for screening for cardiovascular disorders: Secondary | ICD-10-CM | POA: Diagnosis not present

## 2014-01-19 DIAGNOSIS — R0600 Dyspnea, unspecified: Secondary | ICD-10-CM | POA: Diagnosis present

## 2014-01-19 DIAGNOSIS — R002 Palpitations: Secondary | ICD-10-CM | POA: Insufficient documentation

## 2014-01-19 DIAGNOSIS — R079 Chest pain, unspecified: Secondary | ICD-10-CM | POA: Diagnosis not present

## 2014-01-19 MED ORDER — TECHNETIUM TC 99M SESTAMIBI GENERIC - CARDIOLITE
10.0000 | Freq: Once | INTRAVENOUS | Status: AC | PRN
  Administered 2014-01-19: 10 via INTRAVENOUS

## 2014-01-19 MED ORDER — TECHNETIUM TC 99M SESTAMIBI GENERIC - CARDIOLITE
30.0000 | Freq: Once | INTRAVENOUS | Status: AC | PRN
  Administered 2014-01-19: 30 via INTRAVENOUS

## 2014-01-19 NOTE — Progress Notes (Signed)
[facility] [place], [place] [phone]    Cardiology Nuclear Med Study  [address] Ariel Harris is a 68 y.o. female     MRN : [id number]     DOB: 1945-04-21  Procedure Date: 01/19/2014  Nuclear Med Background Indication for Stress Test:  Evaluation for Ischemia History:  2013 MPI: NL EF: 74% with apical thinning Cardiac Risk Factors: Hypertension and Lipids  Symptoms:  Chest Pain and Palpitations   Nuclear Pre-Procedure Caffeine/Decaff Intake:  None NPO After: 7:00pm   Lungs:  clear O2 Sat: 96% on room air. IV 0.9% NS with Angio Cath:  22g  IV Site: R Hand  IV Started by:  Matilde Haymaker, RN  Chest Size (in):  32 Cup Size: B  Height: 5\' 4"  (1.626 m)  Weight:  150 lb (68.04 kg)  BMI:  Body mass index is 25.73 kg/(m^2). Tech Comments:  Patient took Lopressor at 1900 last pm    Nuclear Med Study 1 or 2 day study: 1 day  Stress Test Type:  Stress  Reading MD: n/a  Order Authorizing Provider:  Lauree Chandler and Kerin Ransom PA  Resting Radionuclide: Technetium 83m Sestamibi  Resting Radionuclide Dose: 11.0 mCi   Stress Radionuclide:  Technetium 82m Sestamibi  Stress Radionuclide Dose: 33.0 mCi           Stress Protocol Rest HR: 53 Stress HR: 137  Rest BP: 143/82 Stress BP: 161/93  Exercise Time (min): 4:30 METS: 6.40   Predicted Max HR: 154 bpm % Max HR: 88.96 bpm Rate Pressure Product: 22057   Dose of Adenosine (mg):  n/a Dose of Lexiscan: 0.4 mg  Dose of Atropine (mg): n/a Dose of Dobutamine: n/a mcg/kg/min (at max HR)  Stress Test Technologist: Perrin Maltese, EMT-P  Nuclear Technologist:  Annye Rusk, CNMT     Rest Procedure:  Myocardial perfusion imaging was performed at rest 45 minutes following the intravenous administration of Technetium 9m Sestamibi. Rest ECG: Sinus bradycardia; no ST changes.  Stress Procedure:  The patient exercised on the treadmill utilizing the Bruce Protocol for 4:30 minutes.  The patient stopped due to sob, fatigue, and denied any chest pain.  Technetium 50m Sestamibi was injected at peak exercise and myocardial perfusion imaging was performed after a brief delay. Stress ECG: No significant ST segment change suggestive of ischemia.  QPS Raw Data Images:  Acquisition technically good; normal left ventricular size. Stress Images:  There is decreased uptake in the anterior wall. Rest Images:  There is decreased uptake in the anterior wall. Subtraction (SDS):  No evidence of ischemia. Transient Ischemic Dilatation (Normal <1.22):  0.96 Lung/Heart Ratio (Normal <0.45):  0.31  Quantitative Gated Spect Images QGS EDV:  78 ml QGS ESV:  25 ml  Impression Exercise Capacity:  Poor exercise capacity. BP Response:  Hypertensive blood pressure response. Clinical Symptoms:  There is dyspnea. ECG Impression:  No significant ST segment change suggestive of ischemia. Comparison with Prior Nuclear Study: Compared to 04/10/11, no change.  Overall Impression:  Low risk stress nuclear study with a small, mild, fixed distal anterior defect consistent with soft tissue attenuation; no ischemia.  LV Ejection Fraction: 68%.  LV Wall Motion:  NL LV Function; NL Wall Motion  Ariel Harris

## 2014-01-20 ENCOUNTER — Other Ambulatory Visit: Payer: Self-pay | Admitting: Family Medicine

## 2014-01-24 ENCOUNTER — Ambulatory Visit (INDEPENDENT_AMBULATORY_CARE_PROVIDER_SITE_OTHER): Payer: Medicare Other | Admitting: Emergency Medicine

## 2014-01-24 VITALS — BP 126/78 | HR 53 | Temp 98.2°F | Resp 16 | Ht 63.0 in | Wt 154.0 lb

## 2014-01-24 DIAGNOSIS — M810 Age-related osteoporosis without current pathological fracture: Secondary | ICD-10-CM | POA: Diagnosis not present

## 2014-01-24 DIAGNOSIS — M8580 Other specified disorders of bone density and structure, unspecified site: Secondary | ICD-10-CM

## 2014-01-24 MED ORDER — ALENDRONATE SODIUM 70 MG PO TABS: 70.0000 mg | ORAL_TABLET | ORAL | Status: DC

## 2014-01-24 NOTE — Progress Notes (Signed)
[facility] [address] [place] [address] [phone]  Date:  01/24/2014   Name:  Ariel Harris   DOB:  Dec 31, 1945   MRN:  [id number]  PCP:  Antonietta Jewel, MD    Chief Complaint: Medication Refill and Alendronate Sodium   History of Present Illness:  Ariel Harris is a 68 y.o. very pleasant female patient who presents with the following:  History of osteopenia and needs refill of fosamax No adverse effects of medication Denies other complaint or health concern today.   Patient Active Problem List   Diagnosis Date Noted  . History of PSVT 01/13/2014  . Chest pain 03/28/2011  . Palpitations 03/12/2011  . Dyslipidemia 01/12/2010  . HYPERTENSION 01/12/2010  . CHEST PAIN, ATYPICAL 01/12/2010    Past Medical History  Diagnosis Date  . Chest pain   . Hyperlipidemia   . Hypertension   . SVT (supraventricular tachycardia)   . PAC (premature atrial contraction)     Past Surgical History  Procedure Laterality Date  . None      History  Substance Use Topics  . Smoking status: Never Smoker   . Smokeless tobacco: Not on file  . Alcohol Use: No    History reviewed. No pertinent family history.  No Known Allergies  Medication list has been reviewed and updated.  Current Outpatient Prescriptions on File Prior to Visit  Medication Sig Dispense Refill  . alendronate (FOSAMAX) 70 MG tablet Take 1 tablet (70 mg total) by mouth every 7 (seven) days. Take with a full glass of water on an empty stomach. 13 tablet 3  . calcium-vitamin D (OSCAL WITH D) 500-200 MG-UNIT per tablet Take 1 tablet by mouth 2 (two) times daily.    . metoprolol tartrate (LOPRESSOR) 25 MG tablet Take 25 mg by mouth daily.    . nitroGLYCERIN (NITROSTAT) 0.4 MG SL tablet Place 1 tablet (0.4 mg total) under the tongue every 5 (five) minutes as needed for chest pain. 25 tablet prn  . pravastatin (PRAVACHOL) 40 MG tablet Take 1 tablet (40 mg total) by mouth daily. 90 tablet 3    No current facility-administered medications on file prior to visit.    Review of Systems:  As per HPI, otherwise negative.    Physical Examination: Filed Vitals:   01/24/14 1058  BP: 126/78  Pulse: 53  Temp: 98.2 F (36.8 C)  Resp: 16   Filed Vitals:   01/24/14 1058  Height: 5\' 3"  (1.6 m)  Weight: 154 lb (69.854 kg)   Body mass index is 27.29 kg/(m^2). Ideal Body Weight: Weight in (lb) to have BMI = 25: 140.8   GEN: WDWN, NAD, Non-toxic, Alert & Oriented x 3 HEENT: Atraumatic, Normocephalic.  Ears and Nose: No external deformity. EXTR: No clubbing/cyanosis/edema NEURO: Normal gait.  PSYCH: Normally interactive. Conversant. Not depressed or anxious appearing.  Calm demeanor.    Assessment and Plan: Refill medications  Signed,  Ellison Carwin, MD

## 2014-02-05 ENCOUNTER — Emergency Department (HOSPITAL_COMMUNITY)
Admission: EM | Admit: 2014-02-05 | Discharge: 2014-02-05 | Disposition: A | Discharge status: Home / Self Care | Payer: Medicare Other | Attending: Emergency Medicine | Admitting: Emergency Medicine

## 2014-02-05 ENCOUNTER — Encounter (HOSPITAL_COMMUNITY): Payer: Self-pay | Admitting: Emergency Medicine

## 2014-02-05 ENCOUNTER — Emergency Department (HOSPITAL_COMMUNITY): Payer: Medicare Other

## 2014-02-05 DIAGNOSIS — R918 Other nonspecific abnormal finding of lung field: Secondary | ICD-10-CM | POA: Diagnosis not present

## 2014-02-05 DIAGNOSIS — E785 Hyperlipidemia, unspecified: Secondary | ICD-10-CM | POA: Insufficient documentation

## 2014-02-05 DIAGNOSIS — I10 Essential (primary) hypertension: Secondary | ICD-10-CM | POA: Diagnosis not present

## 2014-02-05 DIAGNOSIS — I517 Cardiomegaly: Secondary | ICD-10-CM | POA: Diagnosis not present

## 2014-02-05 DIAGNOSIS — R0789 Other chest pain: Secondary | ICD-10-CM | POA: Diagnosis not present

## 2014-02-05 DIAGNOSIS — R52 Pain, unspecified: Secondary | ICD-10-CM

## 2014-02-05 DIAGNOSIS — Z79899 Other long term (current) drug therapy: Secondary | ICD-10-CM | POA: Diagnosis not present

## 2014-02-05 DIAGNOSIS — M545 Low back pain: Secondary | ICD-10-CM | POA: Insufficient documentation

## 2014-02-05 DIAGNOSIS — R079 Chest pain, unspecified: Secondary | ICD-10-CM | POA: Diagnosis present

## 2014-02-05 DIAGNOSIS — M5442 Lumbago with sciatica, left side: Secondary | ICD-10-CM

## 2014-02-05 LAB — COMPREHENSIVE METABOLIC PANEL
ALT: 19 U/L (ref 0–35)
AST: 21 U/L (ref 0–37)
Albumin: 4.1 g/dL (ref 3.5–5.2)
Alkaline Phosphatase: 28 U/L — ABNORMAL LOW (ref 39–117)
Anion gap: 7 (ref 5–15)
BUN: 19 mg/dL (ref 6–23)
CALCIUM: 9.2 mg/dL (ref 8.4–10.5)
CO2: 26 mmol/L (ref 19–32)
Chloride: 106 mEq/L (ref 96–112)
Creatinine, Ser: 0.84 mg/dL (ref 0.50–1.10)
GFR calc non Af Amer: 71 mL/min — ABNORMAL LOW (ref >90)
GFR, EST AFRICAN AMERICAN: 82 mL/min — AB (ref >90)
Glucose, Bld: 105 mg/dL — ABNORMAL HIGH (ref 70–99)
Potassium: 3.6 mmol/L (ref 3.5–5.1)
SODIUM: 139 mmol/L (ref 135–145)
TOTAL PROTEIN: 7.9 g/dL (ref 6.0–8.3)
Total Bilirubin: 1 mg/dL (ref 0.3–1.2)

## 2014-02-05 LAB — TROPONIN I

## 2014-02-05 LAB — CBC
HCT: 43.7 % (ref 36.0–46.0)
Hemoglobin: 14.5 g/dL (ref 12.0–15.0)
MCH: 29.2 pg (ref 26.0–34.0)
MCHC: 33.2 g/dL (ref 30.0–36.0)
MCV: 87.9 fL (ref 78.0–100.0)
PLATELETS: 172 10*3/uL (ref 150–400)
RBC: 4.97 MIL/uL (ref 3.87–5.11)
RDW: 12.8 % (ref 11.5–15.5)
WBC: 5.3 10*3/uL (ref 4.0–10.5)

## 2014-02-05 MED ORDER — TRAMADOL HCL 50 MG PO TABS
50.0000 mg | ORAL_TABLET | Freq: Once | ORAL | Status: AC
Start: 2014-02-05 — End: 2014-02-05
  Administered 2014-02-05: 50 mg via ORAL
  Filled 2014-02-05: qty 1

## 2014-02-05 MED ORDER — TRAMADOL HCL 50 MG PO TABS: 50.0000 mg | ORAL_TABLET | Freq: Four times a day (QID) | ORAL | Status: DC | PRN

## 2014-02-05 NOTE — ED Notes (Signed)
Interpreter at bedside.

## 2014-02-05 NOTE — Discharge Instructions (Signed)
It was our pleasure to provide your ER care today - we hope that you feel better.  Take motrin as need for pain.  You may also take ultram as need for pain - no driving when taking ultram.  Follow up with primary care doctor, and your cardiologist, in the coming week.  Return to ER if worse, new symptoms, trouble breathing, fevers, other concern.  You were given pain medication in the ER - no driving for the next 4 hours.    Chest Pain (Nonspecific) It is often hard to give a specific diagnosis for the cause of chest pain. There is always a chance that your pain could be related to something serious, such as a heart attack or a blood clot in the lungs. You need to follow up with your health care provider for further evaluation. CAUSES   Heartburn.  Pneumonia or bronchitis.  Anxiety or stress.  Inflammation around your heart (pericarditis) or lung (pleuritis or pleurisy).  A blood clot in the lung.  A collapsed lung (pneumothorax). It can develop suddenly on its own (spontaneous pneumothorax) or from trauma to the chest.  Shingles infection (herpes zoster virus). The chest wall is composed of bones, muscles, and cartilage. Any of these can be the source of the pain.  The bones can be bruised by injury.  The muscles or cartilage can be strained by coughing or overwork.  The cartilage can be affected by inflammation and become sore (costochondritis). DIAGNOSIS  Lab tests or other studies may be needed to find the cause of your pain. Your health care provider may have you take a test called an ambulatory electrocardiogram (ECG). An ECG records your heartbeat patterns over a 24-hour period. You may also have other tests, such as:  Transthoracic echocardiogram (TTE). During echocardiography, sound waves are used to evaluate how blood flows through your heart.  Transesophageal echocardiogram (TEE).  Cardiac monitoring. This allows your health care provider to monitor your heart  rate and rhythm in real time.  Holter monitor. This is a portable device that records your heartbeat and can help diagnose heart arrhythmias. It allows your health care provider to track your heart activity for several days, if needed.  Stress tests by exercise or by giving medicine that makes the heart beat faster. TREATMENT   Treatment depends on what may be causing your chest pain. Treatment may include:  Acid blockers for heartburn.  Anti-inflammatory medicine.  Pain medicine for inflammatory conditions.  Antibiotics if an infection is present.  You may be advised to change lifestyle habits. This includes stopping smoking and avoiding alcohol, caffeine, and chocolate.  You may be advised to keep your head raised (elevated) when sleeping. This reduces the chance of acid going backward from your stomach into your esophagus. Most of the time, nonspecific chest pain will improve within 2-3 days with rest and mild pain medicine.  HOME CARE INSTRUCTIONS   If antibiotics were prescribed, take them as directed. Finish them even if you start to feel better.  For the next few days, avoid physical activities that bring on chest pain. Continue physical activities as directed.  Do not use any tobacco products, including cigarettes, chewing tobacco, or electronic cigarettes.  Avoid drinking alcohol.  Only take medicine as directed by your health care provider.  Follow your health care provider's suggestions for further testing if your chest pain does not go away.  Keep any follow-up appointments you made. If you do not go to an appointment, you could  develop lasting (chronic) problems with pain. If there is any problem keeping an appointment, call to reschedule. SEEK MEDICAL CARE IF:   Your chest pain does not go away, even after treatment.  You have a rash with blisters on your chest.  You have a fever. SEEK IMMEDIATE MEDICAL CARE IF:   You have increased chest pain or pain that  spreads to your arm, neck, jaw, back, or abdomen.  You have shortness of breath.  You have an increasing cough, or you cough up blood.  You have severe back or abdominal pain.  You feel nauseous or vomit.  You have severe weakness.  You faint.  You have chills. This is an emergency. Do not wait to see if the pain will go away. Get medical help at once. Call your local emergency services (911 in [place] Do not drive yourself to the hospital. MAKE SURE YOU:   Understand these instructions.  Will watch your condition.  Will get help right away if you are not doing well or get worse. Document Released: 10/24/2004 Document Revised: 01/19/2013 Document Reviewed: 08/20/2007 [facility] Patient Information 2015 [place], [place]. This information is not intended to replace advice given to you by your health care provider. Make sure you discuss any questions you have with your health care provider.     Back Pain, Adult Low back pain is very common. About 1 in 5 people have back pain.The cause of low back pain is rarely dangerous. The pain often gets better over time.About half of people with a sudden onset of back pain feel better in just 2 weeks. About 8 in 10 people feel better by 6 weeks.  CAUSES Some common causes of back pain include:  Strain of the muscles or ligaments supporting the spine.  Wear and tear (degeneration) of the spinal discs.  Arthritis.  Direct injury to the back. DIAGNOSIS Most of the time, the direct cause of low back pain is not known.However, back pain can be treated effectively even when the exact cause of the pain is unknown.Answering your caregiver's questions about your overall health and symptoms is one of the most accurate ways to make sure the cause of your pain is not dangerous. If your caregiver needs more information, he or she may order lab work or imaging tests (X-rays or MRIs).However, even if imaging tests show changes in your back, this usually  does not require surgery. HOME CARE INSTRUCTIONS For many people, back pain returns.Since low back pain is rarely dangerous, it is often a condition that people can learn to [facility] their own.   Remain active. It is stressful on the back to sit or stand in one place. Do not sit, drive, or stand in one place for more than 30 minutes at a time. Take short walks on level surfaces as soon as pain allows.Try to increase the length of time you walk each day.  Do not stay in bed.Resting more than 1 or 2 days can delay your recovery.  Do not avoid exercise or work.Your body is made to move.It is not dangerous to be active, even though your back may hurt.Your back will likely heal faster if you return to being active before your pain is gone.  Pay attention to your body when you bend and lift. Many people have less discomfortwhen lifting if they bend their knees, keep the load close to their bodies,and avoid twisting. Often, the most comfortable positions are those that put less stress on your recovering back.  Find a comfortable position to sleep. Use a firm mattress and lie on your side with your knees slightly bent. If you lie on your back, put a pillow under your knees.  Only take over-the-counter or prescription medicines as directed by your caregiver. Over-the-counter medicines to reduce pain and inflammation are often the most helpful.Your caregiver may prescribe muscle relaxant drugs.These medicines help dull your pain so you can more quickly return to your normal activities and healthy exercise.  Put ice on the injured area.  Put ice in a plastic bag.  Place a towel between your skin and the bag.  Leave the ice on for 15-20 minutes, 03-04 times a day for the first 2 to 3 days. After that, ice and heat may be alternated to reduce pain and spasms.  Ask your caregiver about trying back exercises and gentle massage. This may be of some benefit.  Avoid feeling anxious or  stressed.Stress increases muscle tension and can worsen back pain.It is important to recognize when you are anxious or stressed and learn ways to manage it.Exercise is a great option. SEEK MEDICAL CARE IF:  You have pain that is not relieved with rest or medicine.  You have pain that does not improve in 1 week.  You have new symptoms.  You are generally not feeling well. SEEK IMMEDIATE MEDICAL CARE IF:   You have pain that radiates from your back into your legs.  You develop new bowel or bladder control problems.  You have unusual weakness or numbness in your arms or legs.  You develop nausea or vomiting.  You develop abdominal pain.  You feel faint. Document Released: 01/14/2005 Document Revised: 07/16/2011 Document Reviewed: 05/18/2013 [facility] Patient Information 2015 [place], [place]. This information is not intended to replace advice given to you by your health care provider. Make sure you discuss any questions you have with your health care provider.

## 2014-02-05 NOTE — ED Notes (Addendum)
Pt has been having chest pain on and off for 2 weeks, seen at urgent care on Dec 28 for same. Pain continues as a pressure.

## 2014-02-05 NOTE — ED Provider Notes (Signed)
CSN: [id number]     Arrival date & time 02/05/14  0908 History   First MD Initiated Contact with Patient 02/05/14 [phone]     Chief Complaint  Patient presents with  . Chest Pain     (Consider location/radiation/quality/duration/timing/severity/associated sxs/prior Treatment) Patient is a 69 y.o. female presenting with chest pain. The history is provided by the patient.  Chest Pain Associated symptoms: no abdominal pain, no back pain, no cough, no fever, no headache, no numbness, no shortness of breath, not vomiting and no weakness   pt c/o left cp for the past 1-2 months, constant, dull, non radiating, no specific exacerbating or alleviating factors. At rest. Not related to exertion. No sob, nv or diaphoresis. No cough or uri c/o. No fever or chills. No pleuritic pain. Pt has seen cardiology for same, recent stress test neg. Pt also c/o low back pain radiating to left buttock/hip area for the past several weeks. No acute or abrupt change today. No radiation down leg, no numbness/weakness in leg. No trauma or fall. No swelling. No redness.   Past Medical History  Diagnosis Date  . Chest pain   . Hyperlipidemia   . Hypertension   . SVT (supraventricular tachycardia)   . PAC (premature atrial contraction)    Past Surgical History  Procedure Laterality Date  . None     No family history on file. History  Substance Use Topics  . Smoking status: Never Smoker   . Smokeless tobacco: Not on file  . Alcohol Use: No   OB History    No data available     Review of Systems  Constitutional: Negative for fever and chills.  HENT: Negative for sore throat.   Eyes: Negative for redness.  Respiratory: Negative for cough and shortness of breath.   Cardiovascular: Positive for chest pain. Negative for leg swelling.  Gastrointestinal: Negative for vomiting, abdominal pain and diarrhea.  Genitourinary: Negative for flank pain.  Musculoskeletal: Negative for back pain and neck pain.  Skin:  Negative for rash.  Neurological: Negative for weakness, numbness and headaches.  Hematological: Does not bruise/bleed easily.  Psychiatric/Behavioral: Negative for confusion.      Allergies  Review of patient's allergies indicates no known allergies.  Home Medications   Prior to Admission medications   Medication Sig Start Date End Date Taking? Authorizing Provider  alendronate (FOSAMAX) 70 MG tablet Take 1 tablet (70 mg total) by mouth every 7 (seven) days. Take with a full glass of water on an empty stomach. 01/24/14   Roselee Culver, MD  calcium-vitamin D (OSCAL WITH D) 500-200 MG-UNIT per tablet Take 1 tablet by mouth 2 (two) times daily.    Historical Provider, MD  metoprolol tartrate (LOPRESSOR) 25 MG tablet Take 25 mg by mouth daily.    Historical Provider, MD  nitroGLYCERIN (NITROSTAT) 0.4 MG SL tablet Place 1 tablet (0.4 mg total) under the tongue every 5 (five) minutes as needed for chest pain. 02/07/12   Darlin Coco, MD  pravastatin (PRAVACHOL) 40 MG tablet Take 1 tablet (40 mg total) by mouth daily. 03/03/13   Posey Boyer, MD   BP 148/78 mmHg  Pulse 58  Temp(Src) 98.6 F (37 C) (Oral)  Resp 19  SpO2 96% Physical Exam  Constitutional: She appears well-developed and well-nourished. No distress.  HENT:  Mouth/Throat: Oropharynx is clear and moist.  Eyes: Conjunctivae are normal. No scleral icterus.  Neck: Neck supple. No tracheal deviation present.  Cardiovascular: Normal rate, regular rhythm, normal heart  sounds and intact distal pulses.  Exam reveals no gallop and no friction rub.   No murmur heard. Pulmonary/Chest: Effort normal and breath sounds normal. No respiratory distress. She exhibits no tenderness.  Abdominal: Soft. Normal appearance and bowel sounds are normal. She exhibits no distension and no mass. There is no tenderness. There is no rebound and no guarding.  Genitourinary:  No cva tenderness  Musculoskeletal: Normal range of motion. She  exhibits no edema or tenderness.  tls spine non tender. Mild lumbar muscular tenderness on left. No shingles/rash to area of pain. Good rom left hip without pain. Distal pulses palp. No leg swelling.   Neurological: She is alert.  Skin: Skin is warm and dry. No rash noted. She is not diaphoretic.  Psychiatric: She has a normal mood and affect.  Nursing note and vitals reviewed.   ED Course  Procedures (including critical care time) Labs Review  Results for orders placed or performed during the hospital encounter of 02/05/14  CBC  Result Value Ref Range   WBC 5.3 4.0 - 10.5 K/uL   RBC 4.97 3.87 - 5.11 MIL/uL   Hemoglobin 14.5 12.0 - 15.0 g/dL   HCT 43.7 36.0 - 46.0 %   MCV 87.9 78.0 - 100.0 fL   MCH 29.2 26.0 - 34.0 pg   MCHC 33.2 30.0 - 36.0 g/dL   RDW 12.8 11.5 - 15.5 %   Platelets 172 150 - 400 K/uL  Comprehensive metabolic panel  Result Value Ref Range   Sodium 139 135 - 145 mmol/L   Potassium 3.6 3.5 - 5.1 mmol/L   Chloride 106 96 - 112 mEq/L   CO2 26 19 - 32 mmol/L   Glucose, Bld 105 (H) 70 - 99 mg/dL   BUN 19 6 - 23 mg/dL   Creatinine, Ser 0.84 0.50 - 1.10 mg/dL   Calcium 9.2 8.4 - 10.5 mg/dL   Total Protein 7.9 6.0 - 8.3 g/dL   Albumin 4.1 3.5 - 5.2 g/dL   AST 21 0 - 37 U/L   ALT 19 0 - 35 U/L   Alkaline Phosphatase 28 (L) 39 - 117 U/L   Total Bilirubin 1.0 0.3 - 1.2 mg/dL   GFR calc non Af Amer 71 (L) >90 mL/min   GFR calc Af Amer 82 (L) >90 mL/min   Anion gap 7 5 - 15  Troponin I  Result Value Ref Range   Troponin I <0.03 <0.031 ng/mL   Dg Chest 2 View  02/05/2014   CLINICAL DATA:  Acute onset of left-sided chest pain, extending down the left arm. Left lower back pain. Initial encounter.  EXAM: CHEST  2 VIEW  COMPARISON:  Chest radiograph from 02/10/2012  FINDINGS: The lungs are well-aerated. Minimal bibasilar opacities likely reflect atelectasis. There is no evidence of pleural effusion or pneumothorax.  The heart is mildly enlarged. No acute osseous  abnormalities are seen.  IMPRESSION: Minimal bibasilar opacities likely reflect atelectasis; lungs otherwise clear. Mild cardiomegaly noted.   Electronically Signed   By: Garald Balding M.D.   On: 02/05/2014 10:16   Dg Lumbar Spine Complete  02/05/2014   CLINICAL DATA:  Acute onset of left lower back pain. Initial encounter.  EXAM: LUMBAR SPINE - COMPLETE 4+ VIEW  COMPARISON:  None.  FINDINGS: There is no evidence of fracture or subluxation. Vertebral bodies demonstrate normal height and alignment. Intervertebral disc spaces are preserved. The visualized neural foramina are grossly unremarkable in appearance.  The visualized bowel gas pattern is unremarkable  in appearance; air and stool are noted within the colon. The sacroiliac joints are within normal limits.  IMPRESSION: No evidence of fracture or subluxation along the lumbar spine.   Electronically Signed   By: Garald Balding M.D.   On: 02/05/2014 10:17       EKG Interpretation   Date/Time:  Saturday February 05 2014 09:23:30 EST Ventricular Rate:  73 PR Interval:  179 QRS Duration: 105 QT Interval:  420 QTC Calculation: 463 R Axis:   80 Text Interpretation:  Sinus rhythm Baseline wander in lead(s) I No  significant change since last tracing Confirmed by Brigette Hopfer  MD, Lennette Bihari  ([id number]) on 02/05/2014 9:56:22 AM      MDM   Pt has ride, does not have to drive. Ultram po.  Reviewed nursing notes and prior charts for additional history.   Pt with recent cardiology eval and stress test for same symptoms approx 2-3 weeks ago - stress test neg.  After constant symptoms for days, trop neg.  cxr neg acute.  Pt comfortable. No distress. Pulse ox 98% room air. Spine nt.  Pt currently appears stable for d/c.   Will have f/u w pcp and her card in coming week.      Mirna Mires, MD 02/05/14 [phone]

## 2014-02-08 ENCOUNTER — Ambulatory Visit: Payer: Medicare Other | Admitting: Cardiovascular Disease

## 2014-02-21 DIAGNOSIS — M1990 Unspecified osteoarthritis, unspecified site: Secondary | ICD-10-CM | POA: Diagnosis not present

## 2014-02-21 DIAGNOSIS — S300XXA Contusion of lower back and pelvis, initial encounter: Secondary | ICD-10-CM | POA: Diagnosis not present

## 2014-02-21 DIAGNOSIS — E785 Hyperlipidemia, unspecified: Secondary | ICD-10-CM | POA: Diagnosis not present

## 2014-02-21 DIAGNOSIS — M62830 Muscle spasm of back: Secondary | ICD-10-CM | POA: Diagnosis not present

## 2014-02-21 DIAGNOSIS — M545 Low back pain: Secondary | ICD-10-CM | POA: Diagnosis not present

## 2014-03-03 DIAGNOSIS — Z136 Encounter for screening for cardiovascular disorders: Secondary | ICD-10-CM | POA: Diagnosis not present

## 2014-03-03 DIAGNOSIS — Z131 Encounter for screening for diabetes mellitus: Secondary | ICD-10-CM | POA: Diagnosis not present

## 2014-03-03 DIAGNOSIS — M545 Low back pain: Secondary | ICD-10-CM | POA: Diagnosis not present

## 2014-03-03 DIAGNOSIS — M810 Age-related osteoporosis without current pathological fracture: Secondary | ICD-10-CM | POA: Diagnosis not present

## 2014-03-03 DIAGNOSIS — Z8489 Family history of other specified conditions: Secondary | ICD-10-CM | POA: Diagnosis not present

## 2014-03-03 DIAGNOSIS — I10 Essential (primary) hypertension: Secondary | ICD-10-CM | POA: Diagnosis not present

## 2014-03-03 DIAGNOSIS — E785 Hyperlipidemia, unspecified: Secondary | ICD-10-CM | POA: Diagnosis not present

## 2014-03-18 DIAGNOSIS — M545 Low back pain: Secondary | ICD-10-CM | POA: Diagnosis not present

## 2014-03-18 DIAGNOSIS — E785 Hyperlipidemia, unspecified: Secondary | ICD-10-CM | POA: Diagnosis not present

## 2014-03-18 DIAGNOSIS — M810 Age-related osteoporosis without current pathological fracture: Secondary | ICD-10-CM | POA: Diagnosis not present

## 2014-03-18 DIAGNOSIS — I10 Essential (primary) hypertension: Secondary | ICD-10-CM | POA: Diagnosis not present

## 2014-03-18 DIAGNOSIS — I739 Peripheral vascular disease, unspecified: Secondary | ICD-10-CM | POA: Diagnosis not present

## 2014-03-21 ENCOUNTER — Ambulatory Visit: Payer: Medicare Other | Attending: Internal Medicine | Admitting: Physical Therapy

## 2014-03-24 DIAGNOSIS — Z136 Encounter for screening for cardiovascular disorders: Secondary | ICD-10-CM | POA: Diagnosis not present

## 2014-03-24 DIAGNOSIS — M545 Low back pain: Secondary | ICD-10-CM | POA: Diagnosis not present

## 2014-03-24 DIAGNOSIS — Z131 Encounter for screening for diabetes mellitus: Secondary | ICD-10-CM | POA: Diagnosis not present

## 2014-03-24 DIAGNOSIS — E785 Hyperlipidemia, unspecified: Secondary | ICD-10-CM | POA: Diagnosis not present

## 2014-03-24 DIAGNOSIS — Z0000 Encounter for general adult medical examination without abnormal findings: Secondary | ICD-10-CM | POA: Diagnosis not present

## 2014-03-24 DIAGNOSIS — I10 Essential (primary) hypertension: Secondary | ICD-10-CM | POA: Diagnosis not present

## 2014-03-24 DIAGNOSIS — M810 Age-related osteoporosis without current pathological fracture: Secondary | ICD-10-CM | POA: Diagnosis not present

## 2014-04-29 DIAGNOSIS — M79672 Pain in left foot: Secondary | ICD-10-CM | POA: Diagnosis not present

## 2014-04-29 DIAGNOSIS — D2372 Other benign neoplasm of skin of left lower limb, including hip: Secondary | ICD-10-CM | POA: Diagnosis not present

## 2014-06-07 ENCOUNTER — Other Ambulatory Visit: Payer: Self-pay | Admitting: *Deleted

## 2014-06-07 MED ORDER — METOPROLOL TARTRATE 25 MG PO TABS: 25.0000 mg | ORAL_TABLET | Freq: Every day | ORAL | Status: DC

## 2014-06-09 ENCOUNTER — Telehealth: Payer: Self-pay | Admitting: *Deleted

## 2014-06-09 NOTE — Telephone Encounter (Signed)
Ok to refill patients pravastatin?  This was last refilled by her pcp and they manage her lipids. Please advise. Thanks, MI

## 2014-06-10 ENCOUNTER — Telehealth: Payer: Self-pay | Admitting: Cardiovascular Disease

## 2014-06-10 DIAGNOSIS — I10 Essential (primary) hypertension: Secondary | ICD-10-CM | POA: Diagnosis not present

## 2014-06-10 DIAGNOSIS — E785 Hyperlipidemia, unspecified: Secondary | ICD-10-CM | POA: Diagnosis not present

## 2014-06-10 DIAGNOSIS — M810 Age-related osteoporosis without current pathological fracture: Secondary | ICD-10-CM | POA: Diagnosis not present

## 2014-06-10 DIAGNOSIS — M545 Low back pain: Secondary | ICD-10-CM | POA: Diagnosis not present

## 2014-06-10 NOTE — Telephone Encounter (Signed)
Patient was made aware to contact pcp when she walked into the office to request refills and prior to that a lady called on her behalf and requested that it be refilled and she was instructed to call pcp.

## 2014-06-10 NOTE — Telephone Encounter (Signed)
Please ask primary care to refill as they are following her lipids in their office.

## 2014-06-10 NOTE — Telephone Encounter (Signed)
See phone noted dated 06/09/14.  I contacted [facility] on [address] and they report pt saw Dr. Vista Lawman today and refill for Pravastatin was sent in.

## 2014-06-10 NOTE — Telephone Encounter (Signed)
Walk in pt form-Pt needs refills gave to [facility]

## 2014-07-01 ENCOUNTER — Encounter: Payer: Self-pay | Admitting: Cardiovascular Disease

## 2014-07-01 ENCOUNTER — Ambulatory Visit (INDEPENDENT_AMBULATORY_CARE_PROVIDER_SITE_OTHER): Payer: Medicare Other | Admitting: Cardiovascular Disease

## 2014-07-01 VITALS — BP 134/78 | HR 70 | Ht 63.0 in | Wt 153.0 lb

## 2014-07-01 DIAGNOSIS — I471 Supraventricular tachycardia: Secondary | ICD-10-CM | POA: Diagnosis not present

## 2014-07-01 DIAGNOSIS — I10 Essential (primary) hypertension: Secondary | ICD-10-CM | POA: Diagnosis not present

## 2014-07-01 DIAGNOSIS — R072 Precordial pain: Secondary | ICD-10-CM | POA: Diagnosis not present

## 2014-07-01 NOTE — Progress Notes (Signed)
=-  Chief Complaint  Patient presents with  . Follow-up   History of Present Illness: 69 yo [place] female with history of HTN, HLD here today for cardiac follow up. She was admitted October [facility] to [facility] with chest pain and ruled out with serial enzymes. She has had c/o palpitations over the years. 48 hour Holter monitor showed sinus rhythm with PACs and shorts runs of SVT. I started Toprol. She then described chest tightness. I arranged a stress myoview on 04/10/11 which showed no ischemia with normal LV function. She has tolerated Toprol.  She had recurrent chest pain in December 2015 and stress myoview showed no ischemia.   She is here today for planned f/u. An interpretor is here with her. She reports no chest pain or SOB. She is feeling well. No palpitations. Some fatigue.    Primary Care Physician: None  Last Lipid Profile:Lipid Panel     Component Value Date/Time   CHOL 208* 03/03/2013 1025   TRIG 75 03/03/2013 1025   HDL 70 03/03/2013 1025   CHOLHDL 3.0 03/03/2013 1025   VLDL 15 03/03/2013 1025   LDLCALC 123* 03/03/2013 1025     Past Medical History  Diagnosis Date  . Chest pain   . Hyperlipidemia   . Hypertension   . SVT (supraventricular tachycardia)   . PAC (premature atrial contraction)     Past Surgical History  Procedure Laterality Date  . None      Current Outpatient Prescriptions  Medication Sig Dispense Refill  . alendronate (FOSAMAX) 70 MG tablet Take 1 tablet (70 mg total) by mouth every 7 (seven) days. Take with a full glass of water on an empty stomach. (Patient taking differently: Take 70 mg by mouth every Tuesday. Take with a full glass of water on an empty stomach.) 21 tablet 3  . calcium-vitamin D (OSCAL WITH D) 500-200 MG-UNIT per tablet Take 1 tablet by mouth daily with breakfast.     . metoprolol tartrate (LOPRESSOR) 25 MG tablet Take 1 tablet (25 mg total) by mouth daily. 30 tablet 0  . nitroGLYCERIN (NITROSTAT) 0.4 MG SL tablet Place 1  tablet (0.4 mg total) under the tongue every 5 (five) minutes as needed for chest pain. 25 tablet prn  . pravastatin (PRAVACHOL) 40 MG tablet Take 1 tablet (40 mg total) by mouth daily. (Patient taking differently: Take 40 mg by mouth at bedtime. ) 90 tablet 3   No current facility-administered medications for this visit.    No Known Allergies  History   Social History  . Marital Status: Divorced    Spouse Name: N/A  . Number of Children: N/A  . Years of Education: N/A   Occupational History  . Not on file.   Social History Main Topics  . Smoking status: Never Smoker   . Smokeless tobacco: Not on file  . Alcohol Use: No  . Drug Use: No  . Sexual Activity: Not on file   Other Topics Concern  . Not on file   Social History Narrative   Negative family `history--Specifically artery disease or MI. The patient has two siblings died at a young age from fevers.   The patient is separated and single. She is retired but used to work at a [employer] and a [employer]. She is independent of daily living. She has never drink or smoke or done drugs    History reviewed. No pertinent family history.  Review of Systems:  As stated in the HPI and otherwise negative.  BP 134/78 mmHg  Pulse 70  Ht 5\' 3"  (1.6 m)  Wt 153 lb (69.4 kg)  BMI 27.11 kg/m2  SpO2 98%  Physical Examination: General: Well developed, well nourished, NAD HEENT: OP clear, mucus membranes moist SKIN: warm, dry. No rashes. Neuro: No focal deficits Musculoskeletal: Muscle strength 5/5 all ext Psychiatric: Mood and affect normal Neck: No JVD, no carotid bruits, no thyromegaly, no lymphadenopathy. Lungs:Clear bilaterally, no wheezes, rhonci, crackles Cardiovascular: Brady Regular rhythm. No murmurs, gallops or rubs. Abdomen:Soft. Bowel sounds present. Non-tender.  Extremities: No lower extremity edema. Pulses are 2 + in the bilateral DP/PT.  Stress myoview 01/19/14: Stress Procedure: The patient exercised on the  treadmill utilizing the Bruce Protocol for 4:30 minutes. The patient stopped due to sob, fatigue, and denied any chest pain. Technetium 5m Sestamibi was injected at peak exercise and myocardial perfusion imaging was performed after a brief delay. Stress ECG: No significant ST segment change suggestive of ischemia.  QPS Raw Data Images: Acquisition technically good; normal left ventricular size. Stress Images: There is decreased uptake in the anterior wall. Rest Images: There is decreased uptake in the anterior wall. Subtraction (SDS): No evidence of ischemia. Transient Ischemic Dilatation (Normal <1.22): 0.96 Lung/Heart Ratio (Normal <0.45): 0.31  Quantitative Gated Spect Images QGS EDV: 78 ml QGS ESV: 25 ml  Impression Exercise Capacity: Poor exercise capacity. BP Response: Hypertensive blood pressure response. Clinical Symptoms: There is dyspnea. ECG Impression: No significant ST segment change suggestive of ischemia. Comparison with Prior Nuclear Study: Compared to 04/10/11, no change.  Overall Impression: Low risk stress nuclear study with a small, mild, fixed distal anterior defect consistent with soft tissue attenuation; no ischemia.  LV Ejection Fraction: 68%. LV Wall Motion: NL LV Function; NL Wall Motion  EKG:  EKG is not ordered today. The ekg ordered today demonstrates   Recent Labs: 02/05/2014: ALT 19; BUN 19; Creatinine 0.84; Hemoglobin 14.5; Platelets 172; Potassium 3.6; Sodium 139   Lipid Panel    Component Value Date/Time   CHOL 208* 03/03/2013 1025   TRIG 75 03/03/2013 1025   HDL 70 03/03/2013 1025   CHOLHDL 3.0 03/03/2013 1025   VLDL 15 03/03/2013 1025   LDLCALC 123* 03/03/2013 1025     Wt Readings from Last 3 Encounters:  07/01/14 153 lb (69.4 kg)  01/24/14 154 lb (69.854 kg)  01/19/14 150 lb (68.04 kg)     Other studies Reviewed: Additional studies/ records that were reviewed today include: . Review of the above records  demonstrates:    Assessment and Plan:   1. PVCs/SVT: No symptoms. Continue Toprol.   2. HTN: BP controlled. No changes  3. Chest pain: Resolved. LIkely not cardiac. Stress test December 2015 with no ischemia.   Current medicines are reviewed at length with the patient today.  The patient does not have concerns regarding medicines.  The following changes have been made:  no change  Labs/ tests ordered today include:  No orders of the defined types were placed in this encounter.    Disposition:   FU with me in 12  months  Signed, Lauree Chandler, MD 07/01/2014 9:03 AM    [facility] [address], [place], [place]  [address] Phone: [phone]; Fax: [phone]

## 2014-07-01 NOTE — Patient Instructions (Signed)
Medication Instructions:  Your physician recommends that you continue on your current medications as directed. Please refer to the Current Medication list given to you today.   Labwork: none  Testing/Procedures: none  Follow-Up: Your physician wants you to follow-up in:  12 months.  You will receive a reminder letter in the mail two months in advance. If you don't receive a letter, please call our office to schedule the follow-up appointment.        

## 2014-07-30 ENCOUNTER — Other Ambulatory Visit: Payer: Self-pay | Admitting: Cardiovascular Disease

## 2014-08-03 ENCOUNTER — Other Ambulatory Visit: Payer: Self-pay

## 2014-08-03 DIAGNOSIS — Z1231 Encounter for screening mammogram for malignant neoplasm of breast: Secondary | ICD-10-CM

## 2014-09-08 ENCOUNTER — Ambulatory Visit
Admission: RE | Admit: 2014-09-08 | Discharge: 2014-09-08 | Disposition: A | Discharge status: Home / Self Care | Payer: Medicare Other | Source: Ambulatory Visit

## 2014-09-08 DIAGNOSIS — Z1231 Encounter for screening mammogram for malignant neoplasm of breast: Secondary | ICD-10-CM | POA: Diagnosis not present

## 2014-09-09 DIAGNOSIS — Z131 Encounter for screening for diabetes mellitus: Secondary | ICD-10-CM | POA: Diagnosis not present

## 2014-09-09 DIAGNOSIS — M545 Low back pain: Secondary | ICD-10-CM | POA: Diagnosis not present

## 2014-09-09 DIAGNOSIS — I10 Essential (primary) hypertension: Secondary | ICD-10-CM | POA: Diagnosis not present

## 2014-09-09 DIAGNOSIS — E785 Hyperlipidemia, unspecified: Secondary | ICD-10-CM | POA: Diagnosis not present

## 2014-09-09 DIAGNOSIS — M810 Age-related osteoporosis without current pathological fracture: Secondary | ICD-10-CM | POA: Diagnosis not present

## 2014-10-18 DIAGNOSIS — Z23 Encounter for immunization: Secondary | ICD-10-CM | POA: Diagnosis not present

## 2014-12-09 DIAGNOSIS — M545 Low back pain: Secondary | ICD-10-CM | POA: Diagnosis not present

## 2014-12-09 DIAGNOSIS — M810 Age-related osteoporosis without current pathological fracture: Secondary | ICD-10-CM | POA: Diagnosis not present

## 2014-12-09 DIAGNOSIS — Z131 Encounter for screening for diabetes mellitus: Secondary | ICD-10-CM | POA: Diagnosis not present

## 2014-12-09 DIAGNOSIS — I10 Essential (primary) hypertension: Secondary | ICD-10-CM | POA: Diagnosis not present

## 2014-12-09 DIAGNOSIS — E785 Hyperlipidemia, unspecified: Secondary | ICD-10-CM | POA: Diagnosis not present

## 2014-12-09 DIAGNOSIS — Z5181 Encounter for therapeutic drug level monitoring: Secondary | ICD-10-CM | POA: Diagnosis not present

## 2014-12-30 DIAGNOSIS — M1712 Unilateral primary osteoarthritis, left knee: Secondary | ICD-10-CM | POA: Diagnosis not present

## 2014-12-30 DIAGNOSIS — M1711 Unilateral primary osteoarthritis, right knee: Secondary | ICD-10-CM | POA: Diagnosis not present

## 2015-02-24 ENCOUNTER — Encounter (HOSPITAL_COMMUNITY): Payer: Self-pay

## 2015-02-24 ENCOUNTER — Emergency Department (HOSPITAL_COMMUNITY): Payer: Medicare Other

## 2015-02-24 ENCOUNTER — Emergency Department (HOSPITAL_COMMUNITY)
Admission: EM | Admit: 2015-02-24 | Discharge: 2015-02-24 | Disposition: A | Discharge status: Home / Self Care | Payer: Medicare Other | Attending: Emergency Medicine | Admitting: Emergency Medicine

## 2015-02-24 DIAGNOSIS — R0781 Pleurodynia: Secondary | ICD-10-CM | POA: Diagnosis not present

## 2015-02-24 DIAGNOSIS — I10 Essential (primary) hypertension: Secondary | ICD-10-CM | POA: Insufficient documentation

## 2015-02-24 DIAGNOSIS — E785 Hyperlipidemia, unspecified: Secondary | ICD-10-CM | POA: Diagnosis not present

## 2015-02-24 DIAGNOSIS — Z79899 Other long term (current) drug therapy: Secondary | ICD-10-CM | POA: Diagnosis not present

## 2015-02-24 DIAGNOSIS — R0789 Other chest pain: Secondary | ICD-10-CM | POA: Diagnosis not present

## 2015-02-24 DIAGNOSIS — R079 Chest pain, unspecified: Secondary | ICD-10-CM | POA: Diagnosis present

## 2015-02-24 LAB — COMPREHENSIVE METABOLIC PANEL
ALT: 19 U/L (ref 14–54)
AST: 23 U/L (ref 15–41)
Albumin: 3.9 g/dL (ref 3.5–5.0)
Alkaline Phosphatase: 27 U/L — ABNORMAL LOW (ref 38–126)
Anion gap: 5 (ref 5–15)
BUN: 15 mg/dL (ref 6–20)
CO2: 30 mmol/L (ref 22–32)
Calcium: 9.1 mg/dL (ref 8.9–10.3)
Chloride: 106 mmol/L (ref 101–111)
Creatinine, Ser: 0.81 mg/dL (ref 0.44–1.00)
GFR calc Af Amer: >60 mL/min (ref >60)
GFR calc non Af Amer: >60 mL/min (ref >60)
Glucose, Bld: 98 mg/dL (ref 65–99)
Potassium: 3.9 mmol/L (ref 3.5–5.1)
Sodium: 141 mmol/L (ref 135–145)
Total Bilirubin: 1.1 mg/dL (ref 0.3–1.2)
Total Protein: 7.6 g/dL (ref 6.5–8.1)

## 2015-02-24 LAB — CBC WITH DIFFERENTIAL/PLATELET
Basophils Absolute: 0 10*3/uL (ref 0.0–0.1)
Basophils Relative: 0 %
Eosinophils Absolute: 0.2 10*3/uL (ref 0.0–0.7)
Eosinophils Relative: 4 %
HCT: 43.5 % (ref 36.0–46.0)
Hemoglobin: 14.8 g/dL (ref 12.0–15.0)
Lymphocytes Relative: 34 %
Lymphs Abs: 1.5 10*3/uL (ref 0.7–4.0)
MCH: 30 pg (ref 26.0–34.0)
MCHC: 34 g/dL (ref 30.0–36.0)
MCV: 88.1 fL (ref 78.0–100.0)
Monocytes Absolute: 0.2 10*3/uL (ref 0.1–1.0)
Monocytes Relative: 5 %
Neutro Abs: 2.5 10*3/uL (ref 1.7–7.7)
Neutrophils Relative %: 57 %
Platelets: 188 10*3/uL (ref 150–400)
RBC: 4.94 MIL/uL (ref 3.87–5.11)
RDW: 12.6 % (ref 11.5–15.5)
WBC: 4.5 10*3/uL (ref 4.0–10.5)

## 2015-02-24 LAB — I-STAT TROPONIN, ED: Troponin i, poc: 0 ng/mL (ref 0.00–0.08)

## 2015-02-24 LAB — D-DIMER, QUANTITATIVE: D-Dimer, Quant: <0.27 ug/mL-FEU (ref 0.00–0.50)

## 2015-02-24 LAB — LIPASE, BLOOD: Lipase: 40 U/L (ref 11–51)

## 2015-02-24 MED ORDER — IBUPROFEN 600 MG PO TABS: 600.0000 mg | ORAL_TABLET | Freq: Three times a day (TID) | ORAL | Status: DC

## 2015-02-24 MED ORDER — FAMOTIDINE 20 MG PO TABS: 20.0000 mg | ORAL_TABLET | Freq: Every day | ORAL | Status: DC

## 2015-02-24 NOTE — ED Provider Notes (Signed)
CSN: [id number]     Arrival date & time 02/24/15  [id number] History   First MD Initiated Contact with Patient 02/24/15 [phone]     Chief Complaint  Patient presents with  . Chest Pain     (Consider location/radiation/quality/duration/timing/severity/associated sxs/prior Treatment) HPI Patient presents with episodic left inferior chest pain. Described as sharp and worse with deep breathing and standing. Denies any shortness of breath, cough, fever or chills. No nausea or vomiting. Pain does not radiate. No similar symptoms in the past. No recent extended travel or immobilization. No lower extremity swelling or pain. Denies any recent upper respiratory illness and specifically rhinorrhea, congestion, sore throat.  Past Medical History  Diagnosis Date  . Chest pain   . Hyperlipidemia   . Hypertension   . SVT (supraventricular tachycardia) ([place])   . PAC (premature atrial contraction)    Past Surgical History  Procedure Laterality Date  . None     No family history on file. Social History  Substance Use Topics  . Smoking status: Never Smoker   . Smokeless tobacco: None  . Alcohol Use: No   OB History    No data available     Review of Systems  Constitutional: Negative for fever and chills.  HENT: Negative for congestion, facial swelling, rhinorrhea, sinus pressure and sore throat.   Respiratory: Negative for cough, chest tightness and shortness of breath.   Cardiovascular: Positive for chest pain. Negative for palpitations and leg swelling.  Gastrointestinal: Negative for nausea, vomiting and abdominal pain.  Genitourinary: Negative for dysuria, frequency, hematuria and flank pain.  Musculoskeletal: Negative for arthralgias and neck pain.  Skin: Negative for rash and wound.  Neurological: Negative for dizziness, weakness, light-headedness, numbness and headaches.  All other systems reviewed and are negative.     Allergies  Review of patient's allergies indicates no known  allergies.  Home Medications   Prior to Admission medications   Medication Sig Start Date End Date Taking? Authorizing Provider  alendronate (FOSAMAX) 70 MG tablet Take 1 tablet (70 mg total) by mouth every 7 (seven) days. Take with a full glass of water on an empty stomach. Patient taking differently: Take 70 mg by mouth every Tuesday. Take with a full glass of water on an empty stomach. 01/24/14  Yes Roselee Culver, MD  calcium-vitamin D (OSCAL WITH D) 500-200 MG-UNIT per tablet Take 1 tablet by mouth daily with breakfast.    Yes Historical Provider, MD  metoprolol tartrate (LOPRESSOR) 25 MG tablet TAKE 1 TABLET BY MOUTH EVERY DAY 08/02/14  Yes Burnell Blanks, MD  pravastatin (PRAVACHOL) 40 MG tablet Take 1 tablet (40 mg total) by mouth daily. Patient taking differently: Take 40 mg by mouth at bedtime.  03/03/13  Yes Posey Boyer, MD  famotidine (PEPCID) 20 MG tablet Take 1 tablet (20 mg total) by mouth daily. 02/24/15   Julianne Rice, MD  ibuprofen (ADVIL,MOTRIN) 600 MG tablet Take 1 tablet (600 mg total) by mouth 3 (three) times daily after meals. 02/24/15   Julianne Rice, MD  nitroGLYCERIN (NITROSTAT) 0.4 MG SL tablet Place 1 tablet (0.4 mg total) under the tongue every 5 (five) minutes as needed for chest pain. 02/07/12   Darlin Coco, MD   BP 154/91 mmHg  Pulse 74  Temp(Src) 98.2 F (36.8 C) (Oral)  Resp 16  SpO2 97% Physical Exam  Constitutional: She is oriented to person, place, and time. She appears well-developed and well-nourished. No distress.  HENT:  Head: Normocephalic and atraumatic.  Mouth/Throat: Oropharynx is clear and moist.  Eyes: EOM are normal. Pupils are equal, round, and reactive to light.  Neck: Normal range of motion. Neck supple.  Cardiovascular: Normal rate and regular rhythm.  Exam reveals no gallop and no friction rub.   No murmur heard. Pulmonary/Chest: Effort normal and breath sounds normal. No respiratory distress. She has no wheezes.  She has no rales. She exhibits no tenderness.  Abdominal: Soft. Bowel sounds are normal. She exhibits no distension and no mass. There is no tenderness. There is no rebound and no guarding.  Musculoskeletal: Normal range of motion. She exhibits no edema or tenderness.  No CVA tenderness bilaterally. No lower extremity asymmetry, tenderness or swelling. Distal pulses are equal and intact.  Neurological: She is alert and oriented to person, place, and time.  5/5 motor in all extremities. Sensation is fully intact.  Skin: Skin is warm and dry. No rash noted. No erythema.  Psychiatric: She has a normal mood and affect. Her behavior is normal.  Nursing note and vitals reviewed.   ED Course  Procedures (including critical care time) Labs Review Labs Reviewed  COMPREHENSIVE METABOLIC PANEL - Abnormal; Notable for the following:    Alkaline Phosphatase 27 (*)    All other components within normal limits  CBC WITH DIFFERENTIAL/PLATELET  LIPASE, BLOOD  D-DIMER, QUANTITATIVE (NOT AT [facility])  Randolm Idol, ED    Imaging Review Dg Chest 2 View  02/24/2015  CLINICAL DATA:  One week history of left-sided chest pain. History of hypertension EXAM: CHEST  2 VIEW COMPARISON:  February 05, 2014 and January 23, 2010 FINDINGS: There is no edema or consolidation. Heart is borderline enlarged with pulmonary vascularity within normal limits. The aorta is mildly prominent but stable. No adenopathy. No bone lesions. IMPRESSION: No edema or consolidation. Heart borderline enlarged but stable. Aortic prominence appears chronic, likely due to chronic hypertensive change. No adenopathy. Electronically Signed   By: Lowella Grip III M.D.   On: 02/24/2015 09:39   I have personally reviewed and evaluated these images and lab results as part of my medical decision-making.   EKG Interpretation   Date/Time:  Friday February 24 2015 09:22:49 EST Ventricular Rate:  65 PR Interval:  182 QRS Duration: 107 QT  Interval:  423 QTC Calculation: 440 R Axis:   79 Text Interpretation:  Sinus rhythm Confirmed by Lita Mains  MD, Aireana Ryland  ([id number]) on 02/24/2015 9:38:01 AM      MDM   Final diagnoses:  Pleuritic chest pain    Chest pain is pleuritic in nature. Would be very atypical for coronary artery disease. No evidence of ischemia on EKG. Normal troponin. Patient also has a normal d-dimer. We'll treat with NSAIDs and advised follow-up with her primary physician. Also start on Pepcid prophylactically.  Julianne Rice, MD 02/24/15 1110

## 2015-02-24 NOTE — Discharge Instructions (Signed)
Pleurisy  Pleurisy is an inflammation and swelling of the lining of the lungs (pleura). Because of this inflammation, it hurts to breathe. It can be aggravated by coughing, laughing, or deep breathing. Pleurisy is often caused by an underlying infection or disease.   HOME CARE INSTRUCTIONS   Monitor your pleurisy for any changes. The following actions may help to alleviate any discomfort you are experiencing:  · Medicine may help with pain. Only take over-the-counter or prescription medicines for pain, discomfort, or fever as directed by your health care provider.  · Only take antibiotic medicine as directed. Make sure to finish it even if you start to feel better.  SEEK MEDICAL CARE IF:   · Your pain is not controlled with medicine or is increasing.  · You have an increase in pus-like (purulent) secretions brought up with coughing.  SEEK IMMEDIATE MEDICAL CARE IF:   · You have blue or dark lips, fingernails, or toenails.  · You are coughing up blood.  · You have increased difficulty breathing.  · You have continuing pain unrelieved by medicine or pain lasting more than 1 week.  · You have pain that radiates into your neck, arms, or jaw.  · You develop increased shortness of breath or wheezing.  · You develop a fever, rash, vomiting, fainting, or other serious symptoms.  MAKE SURE YOU:  · Understand these instructions.    · Will watch your condition.    · Will get help right away if you are not doing well or get worse.        This information is not intended to replace advice given to you by your health care provider. Make sure you discuss any questions you have with your health care provider.     Document Released: 01/14/2005 Document Revised: 09/16/2012 Document Reviewed: 06/28/2012  Elsevier Interactive Patient Education ©2016 Elsevier Inc.

## 2015-02-24 NOTE — ED Notes (Addendum)
Patient c/o LUQ/left upp chest pain pain x 1wk.  Patient denies nausea, vomiting, urinary symptoms, SHOB.  Patient states pain decreases when sitting still and increases with exertion or deep breathing.

## 2015-03-06 ENCOUNTER — Other Ambulatory Visit: Payer: Self-pay

## 2015-03-06 DIAGNOSIS — Z1231 Encounter for screening mammogram for malignant neoplasm of breast: Secondary | ICD-10-CM

## 2015-03-09 DIAGNOSIS — E785 Hyperlipidemia, unspecified: Secondary | ICD-10-CM | POA: Diagnosis not present

## 2015-03-09 DIAGNOSIS — M545 Low back pain: Secondary | ICD-10-CM | POA: Diagnosis not present

## 2015-03-09 DIAGNOSIS — M810 Age-related osteoporosis without current pathological fracture: Secondary | ICD-10-CM | POA: Diagnosis not present

## 2015-03-09 DIAGNOSIS — I10 Essential (primary) hypertension: Secondary | ICD-10-CM | POA: Diagnosis not present

## 2015-03-09 DIAGNOSIS — R739 Hyperglycemia, unspecified: Secondary | ICD-10-CM | POA: Diagnosis not present

## 2015-03-09 DIAGNOSIS — R7303 Prediabetes: Secondary | ICD-10-CM | POA: Diagnosis not present

## 2015-04-03 DIAGNOSIS — B079 Viral wart, unspecified: Secondary | ICD-10-CM | POA: Diagnosis not present

## 2015-04-03 DIAGNOSIS — M79672 Pain in left foot: Secondary | ICD-10-CM | POA: Diagnosis not present

## 2015-04-03 DIAGNOSIS — M216X2 Other acquired deformities of left foot: Secondary | ICD-10-CM | POA: Diagnosis not present

## 2015-04-20 DIAGNOSIS — M810 Age-related osteoporosis without current pathological fracture: Secondary | ICD-10-CM | POA: Diagnosis not present

## 2015-04-20 DIAGNOSIS — I10 Essential (primary) hypertension: Secondary | ICD-10-CM | POA: Diagnosis not present

## 2015-04-20 DIAGNOSIS — E785 Hyperlipidemia, unspecified: Secondary | ICD-10-CM | POA: Diagnosis not present

## 2015-04-20 DIAGNOSIS — Z131 Encounter for screening for diabetes mellitus: Secondary | ICD-10-CM | POA: Diagnosis not present

## 2015-04-20 DIAGNOSIS — R7303 Prediabetes: Secondary | ICD-10-CM | POA: Diagnosis not present

## 2015-04-20 DIAGNOSIS — M545 Low back pain: Secondary | ICD-10-CM | POA: Diagnosis not present

## 2015-04-20 DIAGNOSIS — H538 Other visual disturbances: Secondary | ICD-10-CM | POA: Diagnosis not present

## 2015-04-20 DIAGNOSIS — Z0100 Encounter for examination of eyes and vision without abnormal findings: Secondary | ICD-10-CM | POA: Diagnosis not present

## 2015-04-20 DIAGNOSIS — Z01118 Encounter for examination of ears and hearing with other abnormal findings: Secondary | ICD-10-CM | POA: Diagnosis not present

## 2015-04-20 DIAGNOSIS — Z0000 Encounter for general adult medical examination without abnormal findings: Secondary | ICD-10-CM | POA: Diagnosis not present

## 2015-04-20 DIAGNOSIS — Z136 Encounter for screening for cardiovascular disorders: Secondary | ICD-10-CM | POA: Diagnosis not present

## 2015-04-24 DIAGNOSIS — B079 Viral wart, unspecified: Secondary | ICD-10-CM | POA: Diagnosis not present

## 2015-04-24 DIAGNOSIS — M79672 Pain in left foot: Secondary | ICD-10-CM | POA: Diagnosis not present

## 2015-05-10 DIAGNOSIS — M1711 Unilateral primary osteoarthritis, right knee: Secondary | ICD-10-CM | POA: Diagnosis not present

## 2015-05-10 DIAGNOSIS — M1712 Unilateral primary osteoarthritis, left knee: Secondary | ICD-10-CM | POA: Diagnosis not present

## 2015-05-29 DIAGNOSIS — B079 Viral wart, unspecified: Secondary | ICD-10-CM | POA: Diagnosis not present

## 2015-05-29 DIAGNOSIS — M79672 Pain in left foot: Secondary | ICD-10-CM | POA: Diagnosis not present

## 2015-06-29 ENCOUNTER — Emergency Department (HOSPITAL_COMMUNITY): Payer: Medicare Other

## 2015-06-29 ENCOUNTER — Encounter (HOSPITAL_COMMUNITY): Payer: Self-pay | Admitting: Emergency Medicine

## 2015-06-29 ENCOUNTER — Emergency Department (HOSPITAL_COMMUNITY)
Admission: EM | Admit: 2015-06-29 | Discharge: 2015-06-30 | Disposition: A | Discharge status: Home / Self Care | Payer: Medicare Other | Attending: Emergency Medicine | Admitting: Emergency Medicine

## 2015-06-29 DIAGNOSIS — R079 Chest pain, unspecified: Secondary | ICD-10-CM | POA: Diagnosis not present

## 2015-06-29 DIAGNOSIS — I10 Essential (primary) hypertension: Secondary | ICD-10-CM | POA: Insufficient documentation

## 2015-06-29 DIAGNOSIS — E785 Hyperlipidemia, unspecified: Secondary | ICD-10-CM | POA: Diagnosis not present

## 2015-06-29 DIAGNOSIS — R0789 Other chest pain: Secondary | ICD-10-CM | POA: Diagnosis not present

## 2015-06-29 DIAGNOSIS — Z79899 Other long term (current) drug therapy: Secondary | ICD-10-CM | POA: Diagnosis not present

## 2015-06-29 LAB — BASIC METABOLIC PANEL
Anion gap: 7 (ref 5–15)
BUN: 22 mg/dL — AB (ref 6–20)
CO2: 26 mmol/L (ref 22–32)
Calcium: 9 mg/dL (ref 8.9–10.3)
Chloride: 107 mmol/L (ref 101–111)
Creatinine, Ser: 0.85 mg/dL (ref 0.44–1.00)
GFR calc Af Amer: >60 mL/min (ref >60)
GFR calc non Af Amer: >60 mL/min (ref >60)
Glucose, Bld: 121 mg/dL — ABNORMAL HIGH (ref 65–99)
Potassium: 3.6 mmol/L (ref 3.5–5.1)
Sodium: 140 mmol/L (ref 135–145)

## 2015-06-29 LAB — CBC
HEMATOCRIT: 40.6 % (ref 36.0–46.0)
HEMOGLOBIN: 14 g/dL (ref 12.0–15.0)
MCH: 29.4 pg (ref 26.0–34.0)
MCHC: 34.5 g/dL (ref 30.0–36.0)
MCV: 85.1 fL (ref 78.0–100.0)
Platelets: 178 10*3/uL (ref 150–400)
RBC: 4.77 MIL/uL (ref 3.87–5.11)
RDW: 12.9 % (ref 11.5–15.5)
WBC: 5.5 10*3/uL (ref 4.0–10.5)

## 2015-06-29 LAB — I-STAT CG4 LACTIC ACID, ED: Lactic Acid, Venous: 0.62 mmol/L (ref 0.5–2.0)

## 2015-06-29 MED ORDER — GI COCKTAIL ~~LOC~~
30.0000 mL | Freq: Once | ORAL | Status: AC
  Administered 2015-06-30: 30 mL via ORAL
  Filled 2015-06-29: qty 30

## 2015-06-29 NOTE — ED Provider Notes (Signed)
CSN: [id number]     Arrival date & time 06/29/15  2006 History  By signing my name below, I, Irene Pap, attest that this documentation has been prepared under the direction and in the presence of Jola Schmidt, MD. Electronically Signed: Irene Pap, ED Scribe. 06/29/2015. 11:48 PM.   Chief Complaint  Patient presents with  . Chest Pain   The history is provided by the patient. A language interpreter was used.  HPI Comments: Ariel Harris is a 70 y.o. Female with a hx of recurrent chest pain, HTN, SVT, and PAC who presents to the Emergency Department complaining of gradually worsening left sided chest pain onset 4 days ago. She states that this chest pain has been intermittent over the past 6 months. Pt reports worsening pain with coughing, deep breathing and drinking water. She reports relief with laying down, but is unable to lay on her side. She reports associated nausea and bilateral foot pain. Granddaughter states that pt has also been belching a lot. She has followed up with a cardiologist but was told that if the chest pain was very intense, that she should come to the ED. She does not know what the cardiologist determined was the cause of her pain. Pt recently began taking Famotidine prescribed in the ED in January. She states that she has not been taking this medication regularly. She denies cough, SOB, emesis, leg swelling, or abdominal pain. Pt speaks [place].   Past Medical History  Diagnosis Date  . Chest pain   . Hyperlipidemia   . Hypertension   . SVT (supraventricular tachycardia) ([place])   . PAC (premature atrial contraction)    Past Surgical History  Procedure Laterality Date  . None     No family history on file. Social History  Substance Use Topics  . Smoking status: Never Smoker   . Smokeless tobacco: None  . Alcohol Use: No   OB History    No data available     Review of Systems A complete 10 system review of systems was obtained and all systems are  negative except as noted in the HPI and PMH.   Allergies  Review of patient's allergies indicates no known allergies.  Home Medications   Prior to Admission medications   Medication Sig Start Date End Date Taking? Authorizing Provider  alendronate (FOSAMAX) 70 MG tablet Take 1 tablet (70 mg total) by mouth every 7 (seven) days. Take with a full glass of water on an empty stomach. Patient taking differently: Take 70 mg by mouth every Tuesday. Take with a full glass of water on an empty stomach. 01/24/14   Roselee Culver, MD  calcium-vitamin D (OSCAL WITH D) 500-200 MG-UNIT per tablet Take 1 tablet by mouth daily with breakfast.     Historical Provider, MD  famotidine (PEPCID) 20 MG tablet Take 1 tablet (20 mg total) by mouth daily. 02/24/15   Julianne Rice, MD  ibuprofen (ADVIL,MOTRIN) 600 MG tablet Take 1 tablet (600 mg total) by mouth 3 (three) times daily after meals. 02/24/15   Julianne Rice, MD  metoprolol tartrate (LOPRESSOR) 25 MG tablet TAKE 1 TABLET BY MOUTH EVERY DAY 08/02/14   Burnell Blanks, MD  nitroGLYCERIN (NITROSTAT) 0.4 MG SL tablet Place 1 tablet (0.4 mg total) under the tongue every 5 (five) minutes as needed for chest pain. 02/07/12   Darlin Coco, MD  pravastatin (PRAVACHOL) 40 MG tablet Take 1 tablet (40 mg total) by mouth daily. Patient taking differently: Take 40  mg by mouth at bedtime.  03/03/13   Posey Boyer, MD   BP 153/91 mmHg  Pulse 71  Temp(Src) 98 F (36.7 C) (Oral)  Resp 25  SpO2 100% Physical Exam  Constitutional: She is oriented to person, place, and time. She appears well-developed and well-nourished. No distress.  HENT:  Head: Normocephalic and atraumatic.  Eyes: EOM are normal.  Neck: Normal range of motion.  Cardiovascular: Normal rate, regular rhythm and normal heart sounds.   Pulmonary/Chest: Effort normal and breath sounds normal.  Abdominal: Soft. She exhibits no distension. There is no tenderness.  Musculoskeletal: Normal  range of motion.  No tenderness or swelling of the legs; normal pulses bilaterally  Neurological: She is alert and oriented to person, place, and time.  Skin: Skin is warm and dry.  Psychiatric: She has a normal mood and affect. Judgment normal.  Nursing note and vitals reviewed.   ED Course  Procedures (including critical care time) DIAGNOSTIC STUDIES: Oxygen Saturation is 10% on RA, normal by my interpretation.    COORDINATION OF CARE: 11:16 PM-Discussed treatment plan which includes labs, EKG, x-ray, continued use of Famotidine and follow up with GI with pt at bedside and pt agreed to plan.    Labs Review Labs Reviewed  BASIC METABOLIC PANEL - Abnormal; Notable for the following:    Glucose, Bld 121 (*)    BUN 22 (*)    All other components within normal limits  CBC  I-STAT TROPOININ, ED  I-STAT CG4 LACTIC ACID, ED  Randolm Idol, ED    Imaging Review Dg Chest 2 View  06/29/2015  CLINICAL DATA:  Left side chest pain x 3-4 days. SOB with exertion HTN, supraventricular tachycardia, premature atrial contraction, never smoker EXAM: CHEST  2 VIEW COMPARISON:  02/24/2015 FINDINGS: Cardiac silhouette mildly enlarged. No mediastinal or hilar masses or evidence of adenopathy. Mild linear scarring in the lung bases, stable. Lungs are otherwise clear. No pleural effusion or pneumothorax. Bony thorax is intact. IMPRESSION: No acute cardiopulmonary disease. Electronically Signed   By: Lajean Manes M.D.   On: 06/29/2015 20:59   I have personally reviewed and evaluated these images and lab results as part of my medical decision-making.   EKG Interpretation   Date/Time:  Thursday June 29 2015 20:19:54 EDT Ventricular Rate:  64 PR Interval:  194 QRS Duration: 110 QT Interval:  437 QTC Calculation: 451 R Axis:   72 Text Interpretation:  Sinus rhythm Ventricular premature complex Low  voltage, extremity and precordial leads No significant change was found  Confirmed by Arden Axon  MD,  Lennette Bihari ([id number]) on 06/29/2015 11:09:13 PM      MDM   Final diagnoses:  Chest pain, unspecified chest pain type    Translator utilized.  Chest pain seems more typical for gastroesophageal reflux disease.  Doubt ACS.  She's been seen and evaluated by cardiology.  I've asked that if her symptoms continue that she seek care with her primary care physician as well as gastroenterology.  She may benefit from a secondary opinion from her cardiologist.  Do not think she needs additional treatment tonight.  Her troponin is normal.  Her EKG is without ischemic changes.   I personally performed the services described in this documentation, which was scribed in my presence. The recorded information has been reviewed and is accurate.      Jola Schmidt, MD 06/30/15 [phone]

## 2015-06-29 NOTE — ED Notes (Signed)
"  When I do a lot of activity the pain comes."  Meds pt brought to ER:  Metoprolol 25 mg, famotidine 20 mg, pravastatin 40 mg, aldendronate sodium 70 mg.

## 2015-06-29 NOTE — ED Notes (Signed)
Patient presents for left sided CP x3-4 days with intermittent radiation to left arm. Reports pain when she burps and also nausea. Denies emesis or diaphoresis. A&O x4, ambulatory with steady gait.

## 2015-06-30 DIAGNOSIS — R079 Chest pain, unspecified: Secondary | ICD-10-CM | POA: Diagnosis not present

## 2015-06-30 LAB — I-STAT TROPONIN, ED: TROPONIN I, POC: 0.01 ng/mL (ref 0.00–0.08)

## 2015-06-30 MED ORDER — FAMOTIDINE 20 MG PO TABS: 20.0000 mg | ORAL_TABLET | Freq: Every day | ORAL | Status: DC

## 2015-06-30 NOTE — Discharge Instructions (Signed)
B?nh trào ng??c d? dày th?c qu?n, Ng??i l?n °(Gastroesophageal Reflux Disease, Adult) °Thông th??ng, th?c ?n di chuy?n xu?ng th?c qu?n và ? l?i d? dày ?? tiêu hóa. Tuy nhiên, khi m?t ng??i b? b?nh trào ng??c d? dày th?c qu?n (GERD), th?c ?n và a xít trong d? dày trào ng??c tr? l?i th?c qu?n. Khi tình tr?ng này x?y ra, th?c qu?n b? loét và viêm. D?n d?n, GERD có th? t?o ra nh?ng l? nh? (v?t loét) trên l?p niêm m?c th?c qu?n.  °NGUYÊN NHÂN °Tình tr?ng này gây ra b?i m?t v?n ?? c?a ph?n c? gi?a th?c qu?n và d? dày (c? th?t th?c qu?n d??i, hay LES). Thông th??ng c? LES ?óng l?i sau khi th?c ?n ?i qua th?c qu?n vào d? dày. Khi LES b? y?u Rubel?c b?t th??ng, c? không ?óng theo ?úng cách và ?i?u ?ó cho phép th?c ?n và a xít d? dày trào ng??c tr? l?i th?c qu?n. LES có th? b? y?u do m?t s? ch?t ?n kiêng nh?t ??nh, thu?c và các tình tr?ng b?nh lý, bao g?m: °· S? d?ng thu?c lá. °· Mang thai. °· Thoát v? [place]. °· S? d?ng nhi?u r??u. °· M?t s? lo?i th?c ?n và ?? u?ng nh?t ??nh, nh? cà phê, sô cô la, hành và b?c hà. °CÁC Y?U T? NGUY C? °Tình tr?ng này hay x?y ra h?n ?: °· Nh?ng ng??i t?ng cân. °· Nh?ng ng??i có các b?nh ? mô liên k?t. °· Nh?ng ng??i s? d?ng thu?c NSAID. °TRI?U CH?NG °Nh?ng tri?u ch?ng c?a tình tr?ng này bao g?m: °· ? nóng. °· Khó nu?t [place]?c ?au khi nu?t. °· C?m th?y nh? có m?t kh?i c?c trong c? h?ng. °· C?m giác ??ng trong mi?ng. °· H?i th? hôi. °· Có nhi?u n??c b?t. °· C?m giác khó ch?u trong b?ng [place]?c ch??ng b?ng. °· ? h?i. °· ?au ng?c. °· Khó th? [place]?c th? khò khè. °· [place] liên t?c (m?n tính) Boccio?c Macchia vào ban ?êm. °· B? h?ng l?p men r?ng. °· S?t cân. °Nh?ng tình tr?ng khác nhau có th? gây ?au ng?c. B?o ??m ph?i ??n khám chuyên gia ch?m sóc s?c kh?e n?u quý v? b? ?au ng?c. °CH?N ?OÁN °Chuyên gia ch?m sóc s?c kh?e c?a quý v? s? h?i v? b?nh s? và khám th?c th? cho quý v?. ?? xác ??nh quý v? b? GERD nh? hay n?ng, chuyên gia ch?m sóc s?c kh?e c?ng có th? theo dõi quý v? ?áp ?ng v?i vi?c ?i?u tr? nh? th? nào. Quý v? c?ng  có th? ph?i làm các ki?m tra khác, bao g?m: °· N?i soi ??  ki?m tra d? dày và th?c qu?n b?ng m?t camera nh?. °· Ki?m tra  ?o n?ng ?? a xít trong th?c qu?n c?a quý v?. °· Ki?m tra  ?o m?c áp l?c lên th?c qu?n c?a quý v?. °· Nu?t bari Lerner?c nu?t bari ?i?u ch?nh ?? hi?n th? hình dáng, kích th??c và ch?c n?ng c?a th?c qu?n c?a quý v?. °?I?U TR? °M?c tiêu c?a ?i?u tr? là giúp gi?m các tri?u ch?ng và tránh bi?n ch?ng. Vi?c ?i?u tr? b?nh này có th? khác nhau tùy thu?c m?c ?? n?ng c?a tri?u ch?ng. Chuyên gia ch?m sóc s?c kh?e c?a quý v? có th? khuy?n ngh?: °· Thay ??i ch? ?? ?n. °· Thu?c. °· Ph?u thu?t. °H??NG D?N CH?M SÓC T?I NHÀ °Ch? ?? ?n °· Tuân th? m?t ch? ?? ?n theo khuy?n ngh? c?a chuyên gia ch?m sóc s?c kh?e. Vi?c này có th? là   trnh cc th?c ?n v ?? u?ng nh?:  C ph v tr (c [place]?c khng c caffeine).  ?? u?ng c ch?ar??u.  ?? u?ng t?ng l?c v ?? u?ng dng trong th? thao.  ?? u?ng c ga Danh?c soda.  S c la v c ca.  B?c h v h??ng v? b?c h.  T?i v hnh.  C?i ng?a (Horseradish).  Cc th?c ?n nhi?u gia v? v a xt, bao g?m h?t tiu, b?t ?t, b?t ca ri, gi?m, n??c s?t cay, v n??c s?t barbecue.  N??c qu? [place]?c qu? h? cam qut, ch?ng h?n nh? cam, chanh v chanh l cam.  Cc th?c ?n c c chua, nh? n??c x?t ??, ?t, n??c x?t salsa, v pizza km x?t ??  Th?c ?n chin v nhi?u ch?t bo, ch?ng h?n nh? bnh rn, khoai ty chin, khoai ty rn v n??c x?t nhi?u ch?t bo.  Th?t nhi?u ch?t bo, ch?ng h?n nh? hot dog (bnh m k?p xc xch) v cc lo?i th?t ?? v tr?ng nhi?u m?, ch?ng h?n nh? th?t n?c l?ng, xc xch, gi?m bng v th?t l?n xng khi.  Nh?ng s?n ph?m b? s?a giu ch?t bo, nh? s?a nguyn kem, b? v pho mt kem.  ?n cc b?a nh?, th??ng xuyn thay v cc b?a no.  Trnh u?ng nhi?u n??c khi qu v? ?n.  Trnh ?n trong kho?ng 2-3 gi? tr??c khi ?i ng?.  Trnh n?m xu?ng ngay sau khi ?n.  Khngt?p th? d?c ngay sau khi ?n. H??ng d?n chung  Ch  ??n nh?ng thay ??i v? tri?u ch?ng  c?a qu v?.  Ch? s? d?ng thu?c khng c?n k ??n v thu?c c?n k ??n theo ch? d?n c?a chuyn gia ch?m [place] s?c kh?e. Khng dng aspirin, ibuprofen, [place]?c cc thu?c NSAID khc tr? khi chuyn gia ch?m [place] s?c kh?e c?a qu v? cho php.  Khng s? d?ng b?t k? s?n ph?m thu?c l no, bao g?m thu?c l d?ng ht, thu?c l d?ng nhai v thu?c l ?i?n t?. N?u qu v? c?n gip ?? ?? cai thu?c, hy h?i chuyn gia ch?m [place] s?c kh?e.  M?c qu?n o r?ng. Khng m?c ci g ch?t quanh eo m c th? t?o p l?c ln b?ng.  Nng (nng cao) ??u gi??ng c?a qu v? thm 6 inch (15 cm).  C? g?ng gi?m c?ng th?ng, ch?ng h?n nh? t?p yoga [place]?c thi?n. N?u qu v? c?n gip ?? ?? gi?m c?ng th?ng, hy h?i chuyn gia ch?m [place] s?c kh?e.  N?u qu v? th?a cn, hy gi?m cn n?ng v? m?c c l?i cho s?c kh?e c?a qu v?. Hy h?i chuyn gia ch?m [place] s?c kh?e ?? ???c h??ng d?n v? m?c tiu gi?m cn an ton.  Tun th? t?t c? cc cu?c h?n khm l?i theo ch? d?n c?a chuyn gia ch?m [place] s?c kh?e. ?i?u ny c vai tr quan tr?ng. ?I KHM N?U:  Qu v? c cc tri?u ch?ng m?i.  Qu v? b? s?t cn khng r nguyn nhn.  Qu v? b? kh nu?t [place]?c b? ?au khi nu?t.  Qu v? th? kh kh [place]?c Hiers dai d?ng.  Cc tri?u ch?ng c?a qu v? khng c?i thi?n sau khi ???c ?i?u tr?Sander Nephew v? b? kh?n gi?ng. NGAY L?P T?C ?I KHM N?U:  Qu v? b? ?au ? cnh tay, c?, hm, r?ng [place]?c l?ng.  Qu v? th?y ?? m? hi, chng m?t [place]?c chong vng.  Qu v? b? ?au ng?c [place]?c kh th?.  Qu v? nn v ch?t nn ra gi?ng nh? mu [place]?c b c ph.  Qu v? b? ng?t.  Phn c?a qu v? c mu Schweiger?c mu ?en.  Qu v? khng th? nu?t, u?ng hay ?n.   Thng tin ny khng nh?m m?c ?ch thay th? cho l?i khuyn m chuyn gia ch?m [place] s?c kh?e ni v?i qu v?. Hy b?o ??m qu v? ph?i th?o lu?n b?t k? v?n ?? g m qu v? c v?i chuyn gia ch?m [place] s?c kh?e c?a qu v?.   Document Released: 10/24/2004 Document Revised: 10/05/2014 Elsevier Interactive Patient Education [employer].

## 2015-07-03 DIAGNOSIS — D485 Neoplasm of uncertain behavior of skin: Secondary | ICD-10-CM | POA: Diagnosis not present

## 2015-07-17 DIAGNOSIS — M71572 Other bursitis, not elsewhere classified, left ankle and foot: Secondary | ICD-10-CM | POA: Diagnosis not present

## 2015-08-09 NOTE — Progress Notes (Signed)
=-  Chief Complaint  Patient presents with  . Follow-up    1 year f/u   History of Present Illness: 70 yo [place] female with history of HTN, HLD here today for cardiac follow up. She was admitted October 2011 to [facility] with chest pain and ruled out with serial enzymes. She has had c/o palpitations over the years. 48 hour Holter monitor showed sinus rhythm with PACs and shorts runs of SVT. I started Toprol. She then described chest tightness. I arranged a stress myoview on 04/10/11 which showed no ischemia with normal LV function. She has tolerated Toprol.  She had recurrent chest pain in December 2015 and stress myoview showed no ischemia. She was seen in the ED at [facility] June 2017 with chest pain. Troponin was negative. EKG without ischemic changes. Her pain was felt to be GI related.    She is here today for planned f/u. We are using an electronic interpretor. She is feeling better since recent ED visit. Her chest pain happens after meals and resolves with burping. She does feel a sour taste in her mouth. No SOB. She is feeling well overall. No palpitations.   Primary Care Physician: Antonietta Jewel, MD   Past Medical History  Diagnosis Date  . Chest pain   . Hyperlipidemia   . Hypertension   . SVT (supraventricular tachycardia) ([place])   . PAC (premature atrial contraction)     Past Surgical History  Procedure Laterality Date  . None      Current Outpatient Prescriptions  Medication Sig Dispense Refill  . alendronate (FOSAMAX) 70 MG tablet Take 70 mg by mouth once a week. Take with a full glass of water on an empty stomach. (Every Tuesday)    . calcium-vitamin D (OSCAL WITH D) 500-200 MG-UNIT per tablet Take 1 tablet by mouth daily with breakfast.     . famotidine (PEPCID) 20 MG tablet Take 1 tablet (20 mg total) by mouth daily. 30 tablet 11  . metoprolol tartrate (LOPRESSOR) 25 MG tablet Take 25 mg by mouth daily.    . nitroGLYCERIN (NITROSTAT) 0.4 MG SL tablet Place 1 tablet  (0.4 mg total) under the tongue every 5 (five) minutes as needed for chest pain. 25 tablet prn  . pravastatin (PRAVACHOL) 40 MG tablet Take 40 mg by mouth daily.     No current facility-administered medications for this visit.    No Known Allergies  Social History   Social History  . Marital Status: Divorced    Spouse Name: N/A  . Number of Children: N/A  . Years of Education: N/A   Occupational History  . Not on file.   Social History Main Topics  . Smoking status: Never Smoker   . Smokeless tobacco: Not on file  . Alcohol Use: No  . Drug Use: No  . Sexual Activity: Not on file   Other Topics Concern  . Not on file   Social History Narrative   Negative family `history--Specifically artery disease or MI. The patient has two siblings died at a young age from fevers.   The patient is separated and single. She is retired but used to work at a [employer] and a [employer]. She is independent of daily living. She has never drink or smoke or done drugs    No family history on file.  Review of Systems:  As stated in the HPI and otherwise negative.   BP 130/92 mmHg  Pulse 65  Ht 5\' 3"  (1.6 m)  Wt 156  lb (70.761 kg)  BMI 27.64 kg/m2  Physical Examination: General: Well developed, well nourished, NAD HEENT: OP clear, mucus membranes moist SKIN: warm, dry. No rashes. Neuro: No focal deficits Musculoskeletal: Muscle strength 5/5 all ext Psychiatric: Mood and affect normal Neck: No JVD, no carotid bruits, no thyromegaly, no lymphadenopathy. Lungs:Clear bilaterally, no wheezes, rhonci, crackles Cardiovascular: Brady Regular rhythm. No murmurs, gallops or rubs. Abdomen:Soft. Bowel sounds present. Non-tender.  Extremities: No lower extremity edema. Pulses are 2 + in the bilateral DP/PT.  Stress myoview 01/19/14: Stress Procedure: The patient exercised on the treadmill utilizing the Bruce Protocol for 4:30 minutes. The patient stopped due to sob, fatigue, and denied any chest  pain. Technetium 17m Sestamibi was injected at peak exercise and myocardial perfusion imaging was performed after a brief delay. Stress ECG: No significant ST segment change suggestive of ischemia.  QPS Raw Data Images: Acquisition technically good; normal left ventricular size. Stress Images: There is decreased uptake in the anterior wall. Rest Images: There is decreased uptake in the anterior wall. Subtraction (SDS): No evidence of ischemia. Transient Ischemic Dilatation (Normal <1.22): 0.96 Lung/Heart Ratio (Normal <0.45): 0.31  Quantitative Gated Spect Images QGS EDV: 78 ml QGS ESV: 25 ml  Impression Exercise Capacity: Poor exercise capacity. BP Response: Hypertensive blood pressure response. Clinical Symptoms: There is dyspnea. ECG Impression: No significant ST segment change suggestive of ischemia. Comparison with Prior Nuclear Study: Compared to 04/10/11, no change.  Overall Impression: Low risk stress nuclear study with a small, mild, fixed distal anterior defect consistent with soft tissue attenuation; no ischemia.  LV Ejection Fraction: 68%. LV Wall Motion: NL LV Function; NL Wall Motion  EKG:  EKG is not  ordered today. The ekg ordered today demonstrates   Recent Labs: 02/24/2015: ALT 19 06/29/2015: BUN 22*; Creatinine, Ser 0.85; Hemoglobin 14.0; Platelets 178; Potassium 3.6; Sodium 140   Lipid Panel    Component Value Date/Time   CHOL 208* 03/03/2013 1025   TRIG 75 03/03/2013 1025   HDL 70 03/03/2013 1025   CHOLHDL 3.0 03/03/2013 1025   VLDL 15 03/03/2013 1025   LDLCALC 123* 03/03/2013 1025     Wt Readings from Last 3 Encounters:  08/10/15 156 lb (70.761 kg)  07/01/14 153 lb (69.4 kg)  01/24/14 154 lb (69.854 kg)     Other studies Reviewed: Additional studies/ records that were reviewed today include: . Review of the above records demonstrates:    Assessment and Plan:   1. PVCs/SVT: No symptoms. Continue Toprol.   2. HTN: BP  controlled. No changes  3. Chest pain: Resolved. Likely not cardiac. Stress test December 2015 with no ischemia. I think her chest pain is GI related. Will refill Pepcid.   Current medicines are reviewed at length with the patient today.  The patient does not have concerns regarding medicines.  The following changes have been made:  no change  Labs/ tests ordered today include:  No orders of the defined types were placed in this encounter.    Disposition:   FU with me in 12  months  Signed, Lauree Chandler, MD 08/10/2015 10:29 AM    [facility] [place], [place], [place]  [address] Phone: [phone]; Fax: [phone]

## 2015-08-10 ENCOUNTER — Ambulatory Visit (INDEPENDENT_AMBULATORY_CARE_PROVIDER_SITE_OTHER): Payer: Medicare Other | Admitting: Cardiovascular Disease

## 2015-08-10 ENCOUNTER — Encounter: Payer: Self-pay | Admitting: Cardiovascular Disease

## 2015-08-10 VITALS — BP 130/92 | HR 65 | Ht 63.0 in | Wt 156.0 lb

## 2015-08-10 DIAGNOSIS — R079 Chest pain, unspecified: Secondary | ICD-10-CM | POA: Diagnosis not present

## 2015-08-10 DIAGNOSIS — I10 Essential (primary) hypertension: Secondary | ICD-10-CM

## 2015-08-10 DIAGNOSIS — I471 Supraventricular tachycardia: Secondary | ICD-10-CM | POA: Diagnosis not present

## 2015-08-10 MED ORDER — FAMOTIDINE 20 MG PO TABS: 20.0000 mg | ORAL_TABLET | Freq: Every day | ORAL | Status: DC

## 2015-08-10 NOTE — Patient Instructions (Signed)
Medication Instructions:  Resume Pepcid 20 mg by mouth daily  Labwork: none  Testing/Procedures: none  Follow-Up: Your physician wants you to follow-up in: 12 months.  You will receive a reminder letter in the mail two months in advance. If you don't receive a letter, please call our office to schedule the follow-up appointment.   Any Other Special Instructions Will Be Listed Below (If Applicable).     If you need a refill on your cardiac medications before your next appointment, please call your pharmacy.

## 2015-09-11 ENCOUNTER — Ambulatory Visit
Admission: RE | Admit: 2015-09-11 | Discharge: 2015-09-11 | Disposition: A | Discharge status: Home / Self Care | Payer: Medicare Other | Source: Ambulatory Visit

## 2015-09-11 DIAGNOSIS — Z1231 Encounter for screening mammogram for malignant neoplasm of breast: Secondary | ICD-10-CM | POA: Diagnosis not present

## 2015-09-13 DIAGNOSIS — M1712 Unilateral primary osteoarthritis, left knee: Secondary | ICD-10-CM | POA: Diagnosis not present

## 2015-09-13 DIAGNOSIS — M1711 Unilateral primary osteoarthritis, right knee: Secondary | ICD-10-CM | POA: Diagnosis not present

## 2015-10-15 DIAGNOSIS — Z23 Encounter for immunization: Secondary | ICD-10-CM | POA: Diagnosis not present

## 2015-10-25 DIAGNOSIS — L84 Corns and callosities: Secondary | ICD-10-CM | POA: Diagnosis not present

## 2015-10-25 DIAGNOSIS — I739 Peripheral vascular disease, unspecified: Secondary | ICD-10-CM | POA: Diagnosis not present

## 2015-10-25 DIAGNOSIS — L603 Nail dystrophy: Secondary | ICD-10-CM | POA: Diagnosis not present

## 2015-12-10 ENCOUNTER — Other Ambulatory Visit (INDEPENDENT_AMBULATORY_CARE_PROVIDER_SITE_OTHER): Payer: Self-pay | Admitting: Sports Medicine

## 2015-12-11 ENCOUNTER — Other Ambulatory Visit: Payer: Self-pay | Admitting: *Deleted

## 2015-12-11 NOTE — Telephone Encounter (Signed)
Please advise as most recent lipid panel in epic is from 2015. Thanks, MI

## 2015-12-11 NOTE — Telephone Encounter (Signed)
2 Rx refill request

## 2015-12-12 DIAGNOSIS — M810 Age-related osteoporosis without current pathological fracture: Secondary | ICD-10-CM | POA: Diagnosis not present

## 2015-12-12 DIAGNOSIS — E785 Hyperlipidemia, unspecified: Secondary | ICD-10-CM | POA: Diagnosis not present

## 2015-12-12 DIAGNOSIS — I10 Essential (primary) hypertension: Secondary | ICD-10-CM | POA: Diagnosis not present

## 2015-12-12 DIAGNOSIS — R7303 Prediabetes: Secondary | ICD-10-CM | POA: Diagnosis not present

## 2015-12-13 NOTE — Telephone Encounter (Signed)
Please ask primary care to refill

## 2016-01-01 ENCOUNTER — Ambulatory Visit (INDEPENDENT_AMBULATORY_CARE_PROVIDER_SITE_OTHER): Payer: Medicare Other | Admitting: Sports Medicine

## 2016-01-01 ENCOUNTER — Encounter (INDEPENDENT_AMBULATORY_CARE_PROVIDER_SITE_OTHER): Payer: Self-pay | Admitting: Sports Medicine

## 2016-01-01 ENCOUNTER — Telehealth (INDEPENDENT_AMBULATORY_CARE_PROVIDER_SITE_OTHER): Payer: Self-pay

## 2016-01-01 VITALS — BP 139/77 | HR 63 | Ht 63.0 in | Wt 150.0 lb

## 2016-01-01 DIAGNOSIS — M170 Bilateral primary osteoarthritis of knee: Secondary | ICD-10-CM | POA: Diagnosis not present

## 2016-01-01 MED ORDER — METHYLPREDNISOLONE ACETATE 40 MG/ML IJ SUSP
40.0000 mg | INTRAMUSCULAR | Status: AC | PRN
  Administered 2016-01-01: 40 mg via INTRA_ARTICULAR

## 2016-01-01 MED ORDER — BUPIVACAINE HCL 0.5 % IJ SOLN
2.0000 mL | INTRAMUSCULAR | Status: AC | PRN
  Administered 2016-01-01: 2 mL via INTRA_ARTICULAR

## 2016-01-01 NOTE — Telephone Encounter (Signed)
Applied for [employer] for patient online per Dr. Paulla Fore for Bilateral knee.

## 2016-01-01 NOTE — Patient Instructions (Addendum)
I recommend you obtained a compression sleeve to help with your joint problems. There are many options on the market however I recommend obtaining a Full Knee Body Helix compression sleeve.  You can find information (including how to appropriate measure yourself for sizing) can be found at www.Body http://www.lambert.com/.  Many of these products are health savings account (HSA) eligible.   He can use the compression sleeve at any time throughout the day but is most important to use while being active as well as for 2 hours post-activity.   It is appropriate to ice following activity with the compression sleeve in place.   We will plan to get preapprove for Visco supplementation. We will be in touch regarding what the co-pay will be.  I am transferring practices as of January 1st  to [place] at [facility].  This is a great opportunity & I am saddened to be leaving [facility] however & excited for new opportunities. I will continue to be seeing patients at [facility] through the end of December. I am happy to see you at the new location but also am confident that you are in great hands with the excellent providers here at [facility].  We are not currently scheduling patients at the new location at this time but if you look on [facility] website a contact information should be available there closer to January. Additionally [email or web address] will have information when it becomes available.    The telephone number will be [phone]  - Nobody will be answering this phone number until closer to January. Marland Kitchen

## 2016-01-01 NOTE — Progress Notes (Signed)
Ariel Harris - 70 y.o. female MRN [id number]  Date of birth: [birth year]  Office Visit Note: Visit Date: 01/01/2016 PCP: Antonietta Jewel, MD Referred by: Antonietta Jewel, MD  Subjective: Chief Complaint  Patient presents with  . Left Leg - Follow-up  . Right Knee - Follow-up  . Left Knee - Follow-up  . Follow-up    Patient states that both knees hurt from sitting to long.  Pain when fast walking.  Swelling when doing things or standing to long.   HPI: 4 month follow-up for bilateral knee pain. She was previously taking oral anti-inflammatories & using topical Voltaren gel & is had worsening pain over the past several months. She underwent injection last year with 3-4 months of relief. She had 2 prior knee arthroscopies. Continue & have pain with walking & bending. Positive theater sign. No other mechanical symptoms. ROS: Otherwise per HPI.   Clinical History: No specialty comments available.  She reports that she has never smoked. She does not have any smokeless tobacco history on file.  No results for input(s): HGBA1C, LABURIC in the last 8760 hours.  Assessment & Plan: Visit Diagnoses:    ICD-9-CM ICD-10-CM   1. Primary osteoarthritis of both knees 715.16 M17.0 Synovial cell count + diff, w/ crystals    Plan: Injections as below. We will try to get her preauthorized for viscosupplementation. Follow-up: No Follow-up on file.  Meds: No orders of the defined types were placed in this encounter.  Procedures:  Right knee ultrasound-guided aspiration & injection Date/Time: 01/01/2016 10:52 AM Performed by: Teresa Coombs D Authorized by: Teresa Coombs D   Indications:  Pain and joint swelling Location:  Knee Site:  R knee Needle Size:  18 G Needle Length:  1.5 inches Approach:  Superolateral Ultrasound Guidance: Yes   Fluoroscopic Guidance: No   Arthrogram: No   Medications:  2 mL bupivacaine 0.5 %; 40 mg methylPREDNISolone acetate 40 MG/ML Aspiration Attempted: Yes     Aspirate amount (mL):  5 Aspirate:  Yellow Lab: fluid sent for laboratory analysis    The patient's clinical condition is marked by substantial pain and/or significant functional disability. Other conservative therapy has not provided relief, is contraindicated, or not appropriate. There is a reasonable likelihood that injection will significantly improve the patient's pain and/or functional impairment.  After discussing the risks, benefits and expected outcomes of the injection and all questions were reviewed and answered, the patient wished to undergo the above named procedure.  Verbal consent was obtained. The right knee was initially prepped with alcohol scrub. Local anesthesia was obtained with ethyl chloride and 5mL of 1% lidocaine on a 25g needle.  Under real-time ultrasound guidance, Injection of the target structure was performed using the above needle and medications under sterile technique. Band-Aid and 6" Ace Wrap was applied.   The patient tolerated this procedure well with no immediate complications. Post injection instructions were provided.    Left Knee US Guided Injection Performed by: Teresa Coombs D Authorized by: Teresa Coombs D   Indications:  Pain and joint swelling Location:  Knee Site:  L knee Needle Size:  25 G Needle Length:  1.5 inches Approach:  Superolateral Ultrasound Guidance: Yes   Fluoroscopic Guidance: No   Arthrogram: No   Medications:  2 mL bupivacaine 0.5 %; 40 mg methylPREDNISolone acetate 40 MG/ML Aspiration Attempted: No    The patient's clinical condition is marked by substantial pain and/or significant functional disability. Other conservative therapy has not provided relief, is contraindicated,  or not appropriate. There is a reasonable likelihood that injection will significantly improve the patient's pain and/or functional impairment.  After discussing the risks, benefits and expected outcomes of the injection and all questions were reviewed and  answered, the patient wished to undergo the above named procedure.  Verbal consent was obtained. The target sight was prepped with alcohol scrub. Local anesthesia was obtained with ethyl chloride and 66mL of 1% lidocaine on a 25g needle.  Under real-time ultrasound guidance, Injection of the target structure was performed using the above needle and medications under sterile stopcock technique. Band-Aid and 6" Ace Wrap was applied. The patient tolerated this procedure well with no immediate complications. Post injection instructions were provided.        Objective:  VS:  HT:5\' 3"  (160 cm)   WT:150 lb (68 kg)  BMI:26.6    BP:139/77  HR:63bpm  TEMP: ( )  RESP:  Physical Exam: Adult female. Alert and appropriate.  In no acute distress.  Lower extremities are overall well aligned with no significant deformity. No significant swelling. DP & PT pulses 2+/4. No significant bruising/ecchymosis or erythema the skin Bilateral knees: A 5 flexure contracture bilaterally with varus deformity. Extensor mechanism is intact. 3-4 mm of opening with valgus stressing. Otherwise ligamentously stable. No pain with McMurray's. Positive patellar grind.  Imaging: No results found.  Past Medical/Family/Surgical/Social History: Medications & Allergies reviewed per EMR Patient Active Problem List   Diagnosis Date Noted  . History of PSVT 01/13/2014  . Chest pain 03/28/2011  . Palpitations 03/12/2011  . Dyslipidemia 01/12/2010  . HYPERTENSION 01/12/2010  . CHEST PAIN, ATYPICAL 01/12/2010   Past Medical History:  Diagnosis Date  . Chest pain   . Hyperlipidemia   . Hypertension   . PAC (premature atrial contraction)   . SVT (supraventricular tachycardia) ([facility])    No family history on file. Past Surgical History:  Procedure Laterality Date  . None     Social History   Occupational History  . Not on file.   Social History Main Topics  . Smoking status: Never Smoker  . Smokeless tobacco: Not on  file  . Alcohol use No  . Drug use: No  . Sexual activity: Not on file

## 2016-01-02 ENCOUNTER — Telehealth (INDEPENDENT_AMBULATORY_CARE_PROVIDER_SITE_OTHER): Payer: Self-pay

## 2016-01-02 LAB — SYNOVIAL CELL COUNT + DIFF, W/ CRYSTALS: WBC, Synovial: <10 cells/uL (ref <150)

## 2016-01-02 NOTE — Telephone Encounter (Signed)
Patient is approved for Bilateral knee injections for Monovisc.  Provider may buy and bill.

## 2016-01-03 NOTE — Telephone Encounter (Signed)
Patient does have pseudogout on Crystal analysis. No further recommendations at this time. Consider colchicine if acutely flared. Patient should follow-up for Monovisc injections whenever it is convenient for her.  Please call & inform her of the above.

## 2016-01-04 ENCOUNTER — Telehealth (INDEPENDENT_AMBULATORY_CARE_PROVIDER_SITE_OTHER): Payer: Self-pay | Admitting: Sports Medicine

## 2016-01-04 NOTE — Telephone Encounter (Signed)
Talked with patient and advised her of message.  Patient stated that she will call to schedule appointments for Bilateral Monovisc injections.

## 2016-01-04 NOTE — Telephone Encounter (Signed)
Noted. Thank You.

## 2016-01-04 NOTE — Telephone Encounter (Signed)
Letting you know that pt's translator scheduled pt's Bilateral Monovisc injections for Monday at 17, thanks

## 2016-01-05 ENCOUNTER — Other Ambulatory Visit (INDEPENDENT_AMBULATORY_CARE_PROVIDER_SITE_OTHER): Payer: Self-pay | Admitting: Sports Medicine

## 2016-01-05 NOTE — Telephone Encounter (Signed)
Rx refill request

## 2016-01-08 ENCOUNTER — Encounter (INDEPENDENT_AMBULATORY_CARE_PROVIDER_SITE_OTHER): Payer: Self-pay | Admitting: Sports Medicine

## 2016-01-08 ENCOUNTER — Ambulatory Visit (INDEPENDENT_AMBULATORY_CARE_PROVIDER_SITE_OTHER): Payer: Medicare Other | Admitting: Sports Medicine

## 2016-01-08 VITALS — BP 138/86 | HR 67 | Ht 63.0 in | Wt 150.0 lb

## 2016-01-08 DIAGNOSIS — M170 Bilateral primary osteoarthritis of knee: Secondary | ICD-10-CM | POA: Diagnosis not present

## 2016-01-08 MED ORDER — HYALURONAN 88 MG/4ML IX SOSY
88.0000 mg | PREFILLED_SYRINGE | INTRA_ARTICULAR | Status: AC | PRN
  Administered 2016-01-08: 88 mg via INTRA_ARTICULAR

## 2016-01-08 NOTE — Progress Notes (Signed)
Ariel Harris - 70 y.o. female MRN [id number]  Date of birth: 1945-09-08  Office Visit Note: Visit Date: 01/08/2016 PCP: Antonietta Jewel, MD Referred by: Antonietta Jewel, MD  Subjective: Chief Complaint  Patient presents with  . Right Knee - Follow-up  . Left Knee - Follow-up  . Bilateral Knee Injections    Patient is here to have bilateral Monovisc injections.   HPI: Patient is here for bilateral Monovisc injections. She has known underlying osteoarthritis. She underwent cortisone injections one week ago & had only moderate improvement with this. ROS: Otherwise per HPI.   Clinical History: No specialty comments available.  She reports that she has never smoked. She does not have any smokeless tobacco history on file.  No results for input(s): HGBA1C, LABURIC in the last 8760 hours.  Assessment & Plan: Visit Diagnoses: No diagnosis found.  Plan: Injections as below. Right knee attempted aspiration only 3 mL of synovial fluid with only significant synovitis noted on ultrasound. Follow-up: Return if symptoms worsen or fail to improve.  Meds: No orders of the defined types were placed in this encounter.  Procedures:  Right Knee Aspiration and Monovisc Injection Performed by: Teresa Coombs D Authorized by: Teresa Coombs D   Indications:  Pain and joint swelling Location:  Knee Site:  R knee Needle Size:  18 G Approach:  Superolateral Ultrasound Guidance: Yes   Fluoroscopic Guidance: No   Arthrogram: No   Medications:  88 mg Hyaluronan 88 MG/4ML  The patient's clinical condition is marked by substantial pain and/or significant functional disability. Other conservative therapy has not provided relief, is contraindicated, or not appropriate. There is a reasonable likelihood that injection will significantly improve the patient's pain and/or functional impairment.  After discussing the risks, benefits and expected outcomes of the injection and all questions were reviewed and  answered, the patient wished to undergo the above named procedure.  Verbal consent was obtained. The target sight was prepped with alcohol scrub. Local anesthesia was obtained with ethyl chloride and 80mL of 1% lidocaine on a 25g needle.  Under real-time ultrasound guidance, Aspiration and Injection of the target structure was performed using the above needle and medications under sterile stopcock technique. Band-Aid was applied. The patient tolerated this procedure well with no immediate complications. Post injection instructions were provided.   Left knee Monovisc injection Date/Time: 01/08/2016 1:45 PM Performed by: Teresa Coombs D Authorized by: Teresa Coombs D   Indications:  Pain and joint swelling Location:  Knee Site:  L knee Needle Size:  22 G Approach:  Superolateral Ultrasound Guidance: Yes   Fluoroscopic Guidance: No   Arthrogram: No   Medications:  88 mg Hyaluronan 88 MG/4ML  The patient's clinical condition is marked by substantial pain and/or significant functional disability. Other conservative therapy has not provided relief, is contraindicated, or not appropriate. There is a reasonable likelihood that injection will significantly improve the patient's pain and/or functional impairment.  After discussing the risks, benefits and expected outcomes of the injection and all questions were reviewed and answered, the patient wished to undergo the above named procedure.  Verbal consent was obtained. The target sight was prepped with alcohol scrub. Local anesthesia was obtained with ethyl chloride and 74mL of 1% lidocaine on a 25g needle.  Under real-time ultrasound guidance, Injection of the target structure was performed using the above needle and medications under sterile stopcock technique. Band-Aid was applied. The patient tolerated this procedure well with no immediate complications. Post injection instructions were provided.  Objective:  VS:  HT:5\' 3"  (160 cm)   WT:150 lb  (68 kg)  BMI:26.6    BP:138/86  HR:67bpm  TEMP: ( )  RESP:  Physical Exam:  Adult female. Alert and appropriate.  In no acute distress.  Lower extremities are overall well aligned with no significant deformity. No significant swelling.  Distal pulses 2+/4. No significant bruising/ecchymosis or erythema the skin RIGHT knee: Small to moderate effusion. Generalized osteoarthritic bossing. Left knee: Small effusion. Generalized arthritic bossing.  Imaging: No results found.  Past Medical/Family/Surgical/Social History: Medications & Allergies reviewed per EMR Patient Active Problem List   Diagnosis Date Noted  . History of PSVT 01/13/2014  . Chest pain 03/28/2011  . Palpitations 03/12/2011  . Dyslipidemia 01/12/2010  . HYPERTENSION 01/12/2010  . CHEST PAIN, ATYPICAL 01/12/2010   Past Medical History:  Diagnosis Date  . Chest pain   . Hyperlipidemia   . Hypertension   . PAC (premature atrial contraction)   . SVT (supraventricular tachycardia) ([facility])    No family history on file. Past Surgical History:  Procedure Laterality Date  . None     Social History   Occupational History  . Not on file.   Social History Main Topics  . Smoking status: Never Smoker  . Smokeless tobacco: Not on file  . Alcohol use No  . Drug use: No  . Sexual activity: Not on file

## 2016-01-24 DIAGNOSIS — L84 Corns and callosities: Secondary | ICD-10-CM | POA: Diagnosis not present

## 2016-01-24 DIAGNOSIS — L603 Nail dystrophy: Secondary | ICD-10-CM | POA: Diagnosis not present

## 2016-01-24 DIAGNOSIS — I739 Peripheral vascular disease, unspecified: Secondary | ICD-10-CM | POA: Diagnosis not present

## 2016-03-08 ENCOUNTER — Emergency Department (HOSPITAL_COMMUNITY): Payer: Medicare Other

## 2016-03-08 ENCOUNTER — Emergency Department (HOSPITAL_COMMUNITY)
Admission: EM | Admit: 2016-03-08 | Discharge: 2016-03-09 | Disposition: A | Discharge status: Home / Self Care | Payer: Medicare Other | Attending: Emergency Medicine | Admitting: Emergency Medicine

## 2016-03-08 ENCOUNTER — Encounter (HOSPITAL_COMMUNITY): Payer: Self-pay | Admitting: Oncology

## 2016-03-08 DIAGNOSIS — R002 Palpitations: Secondary | ICD-10-CM | POA: Diagnosis not present

## 2016-03-08 DIAGNOSIS — I10 Essential (primary) hypertension: Secondary | ICD-10-CM | POA: Diagnosis not present

## 2016-03-08 DIAGNOSIS — R079 Chest pain, unspecified: Secondary | ICD-10-CM | POA: Diagnosis not present

## 2016-03-08 DIAGNOSIS — Z79899 Other long term (current) drug therapy: Secondary | ICD-10-CM | POA: Insufficient documentation

## 2016-03-08 LAB — I-STAT TROPONIN, ED: Troponin i, poc: 0 ng/mL (ref 0.00–0.08)

## 2016-03-08 LAB — BASIC METABOLIC PANEL
Anion gap: 6 (ref 5–15)
BUN: 16 mg/dL (ref 6–20)
CHLORIDE: 105 mmol/L (ref 101–111)
CO2: 27 mmol/L (ref 22–32)
CREATININE: 0.88 mg/dL (ref 0.44–1.00)
Calcium: 9.2 mg/dL (ref 8.9–10.3)
GFR calc non Af Amer: >60 mL/min (ref >60)
Glucose, Bld: 99 mg/dL (ref 65–99)
Potassium: 3.9 mmol/L (ref 3.5–5.1)
Sodium: 138 mmol/L (ref 135–145)

## 2016-03-08 LAB — CBC
HCT: 42.4 % (ref 36.0–46.0)
Hemoglobin: 14.3 g/dL (ref 12.0–15.0)
MCH: 29.5 pg (ref 26.0–34.0)
MCHC: 33.7 g/dL (ref 30.0–36.0)
MCV: 87.4 fL (ref 78.0–100.0)
PLATELETS: 185 10*3/uL (ref 150–400)
RBC: 4.85 MIL/uL (ref 3.87–5.11)
RDW: 12.7 % (ref 11.5–15.5)
WBC: 5.1 10*3/uL (ref 4.0–10.5)

## 2016-03-08 NOTE — ED Triage Notes (Signed)
Pt c/o left sided CP x 2 months.  Per pt her PCP told her to come to ED if pain ever came back.  Pt rates pain 6/10.  Denies shob, N/V, diaphoresis.

## 2016-03-09 ENCOUNTER — Encounter (HOSPITAL_COMMUNITY): Payer: Self-pay | Admitting: Emergency Medicine

## 2016-03-09 DIAGNOSIS — R002 Palpitations: Secondary | ICD-10-CM | POA: Diagnosis not present

## 2016-03-09 LAB — I-STAT TROPONIN, ED: TROPONIN I, POC: 0 ng/mL (ref 0.00–0.08)

## 2016-03-09 MED ORDER — GI COCKTAIL ~~LOC~~
30.0000 mL | Freq: Once | ORAL | Status: AC
  Administered 2016-03-09: 30 mL via ORAL
  Filled 2016-03-09: qty 30

## 2016-03-09 NOTE — ED Provider Notes (Signed)
[facility] DEPT Provider Note   CSN: [id number] Arrival date & time: 03/08/16  2017  By signing my name below, I, Hansel Feinstein, attest that this documentation has been prepared under the direction and in the presence of Pax Reasoner, MD. Electronically Signed: Hansel Feinstein, ED Scribe. 03/09/16. 1:23 AM.     History   Chief Complaint Chief Complaint  Patient presents with  . Chest Pain    HPI Ariel Harris is a 71 y.o. female with h/o HTN, HLD, SVT, osteoporosis who presents to the Emergency Department complaining of moderate, pounding, left-sided CP that began 2 months ago and worsened yesterday. Pt has been evaluated by her PCP for the pain and has been prescribed Metoprolol. No other work up has been done. The pt states her pain is worst when she is ambulating and when recumbent. No alleviating factors noted. Pt denies SOB, additional complaints or symptoms.   The history is provided by the patient and a relative. No language interpreter was used.  Chest Pain   This is a recurrent problem. The current episode started more than 1 week ago. The problem occurs daily. The problem has been gradually worsening. The pain is associated with walking and rest. The pain is present in the lateral region. The pain is moderate. Quality: pounding. The pain does not radiate. The symptoms are aggravated by certain positions. Pertinent negatives include no diaphoresis, no palpitations, no shortness of breath and no syncope. The treatment provided no relief. Risk factors include post-menopausal.  Her past medical history is significant for hypertension.  Pertinent negatives for family medical history include: no Marfan's syndrome.  Procedure history is negative for cardiac catheterization.    Past Medical History:  Diagnosis Date  . Chest pain   . Hyperlipidemia   . Hypertension   . PAC (premature atrial contraction)   . SVT (supraventricular tachycardia) [facility])     Patient Active Problem List     Diagnosis Date Noted  . History of PSVT 01/13/2014  . Chest pain 03/28/2011  . Palpitations 03/12/2011  . Dyslipidemia 01/12/2010  . HYPERTENSION 01/12/2010  . CHEST PAIN, ATYPICAL 01/12/2010    Past Surgical History:  Procedure Laterality Date  . None      OB History    No data available       Home Medications    Prior to Admission medications   Medication Sig Start Date End Date Taking? Authorizing Provider  alendronate (FOSAMAX) 70 MG tablet Take 70 mg by mouth once a week. Take with a full glass of water on an empty stomach. (Every Tuesday)   Yes Historical Provider, MD  calcium-vitamin D (OSCAL WITH D) 500-200 MG-UNIT per tablet Take 1 tablet by mouth daily with breakfast.    Yes Historical Provider, MD  metoprolol tartrate (LOPRESSOR) 25 MG tablet Take 25 mg by mouth daily.   Yes Historical Provider, MD  nitroGLYCERIN (NITROSTAT) 0.4 MG SL tablet Place 1 tablet (0.4 mg total) under the tongue every 5 (five) minutes as needed for chest pain. 02/07/12  Yes Darlin Coco, MD  pravastatin (PRAVACHOL) 40 MG tablet Take 40 mg by mouth daily.   Yes Historical Provider, MD  VOLTAREN 1 % GEL APPLY TO AFFECTED AREA 4 TIMES A DAY AS NEEDED FOR PAIN 12/19/15  Yes Historical Provider, MD  famotidine (PEPCID) 20 MG tablet Take 1 tablet (20 mg total) by mouth daily. Patient not taking: Reported on 01/01/2016 08/10/15   Burnell Blanks, MD  meloxicam (MOBIC) 7.5 MG  tablet TAKE 1 TABLET BY MOUTH EVERY MORNING AND TAKE 1 TABLET BY MOUTH EVERY DAY AT BEDTIME AS NEEDED Patient not taking: Reported on 03/09/2016 01/08/16   Gerda Diss, DO    Family History No family history on file.  Social History Social History  Substance Use Topics  . Smoking status: Never Smoker  . Smokeless tobacco: Never Used  . Alcohol use No     Allergies   Patient has no known allergies.   Review of Systems Review of Systems  Constitutional: Negative for diaphoresis.  Respiratory:  Negative for shortness of breath.   Cardiovascular: Positive for chest pain. Negative for palpitations, leg swelling and syncope.  All other systems reviewed and are negative.    Physical Exam Updated Vital Signs BP 127/76   Pulse 62   Temp 97.9 F (36.6 C) (Oral)   Resp 17   Ht 5\' 3"  (1.6 m)   Wt 150 lb (68 kg)   SpO2 95%   BMI 26.57 kg/m   Physical Exam  Constitutional: She is oriented to person, place, and time. She appears well-developed and well-nourished.  HENT:  Head: Normocephalic and atraumatic.  Mouth/Throat: Oropharynx is clear and moist. No oropharyngeal exudate.  Moist mucous membranes. No exudates.   Eyes: Conjunctivae and EOM are normal. Pupils are equal, round, and reactive to light.  Neck: Normal range of motion. Neck supple. No JVD present. No tracheal deviation present.  No carotid bruits. Trachea midline.   Cardiovascular: Normal rate, regular rhythm, normal heart sounds and intact distal pulses.  Exam reveals no gallop and no friction rub.   No murmur heard. RRR.   Pulmonary/Chest: Effort normal and breath sounds normal. No stridor. No respiratory distress. She has no wheezes. She has no rales.  Lungs CTA bilaterally.   Abdominal: Soft. She exhibits no distension. There is no rebound and no guarding.  Extremely hyperactive bowel sounds that can be heard into the thoracic cavity.   Musculoskeletal: Normal range of motion. She exhibits no edema.  No cords or edema. All compartments are soft.   Lymphadenopathy:    She has no cervical adenopathy.  Neurological: She is alert and oriented to person, place, and time. She has normal reflexes. She displays normal reflexes.  Skin: Skin is warm and dry.  Psychiatric: She has a normal mood and affect.  Nursing note and vitals reviewed.    ED Treatments / Results   DIAGNOSTIC STUDIES: Oxygen Saturation is 99% on RA, normal by my interpretation.    COORDINATION OF CARE: 1:18 AM Discussed treatment plan with  pt at bedside which includes lab work, CXR and pt agreed to plan.    Labs (all labs ordered are listed, but only abnormal results are displayed) Labs Reviewed  BASIC METABOLIC PANEL  [place], ED  Randolm Idol, ED    EKG  EKG Interpretation  Date/Time:  Friday March 08 2016 20:28:35 EST Ventricular Rate:  59 PR Interval:    QRS Duration: 99 QT Interval:  431 QTC Calculation: 427 R Axis:   73 Text Interpretation:  Sinus rhythm Confirmed by [facility]  MD, Xiadani Damman ([id number]) on 03/08/2016 11:53:54 PM       Radiology Dg Chest 2 View  Result Date: 03/08/2016 CLINICAL DATA:  Left-sided chest pain for 2 months. Primary care physician told her to come to ED if pain came back. History of hypertension. EXAM: CHEST  2 VIEW COMPARISON:  06/29/2015 FINDINGS: Normal heart size and pulmonary vascularity. No focal airspace  disease or consolidation in the lungs. No blunting of costophrenic angles. No pneumothorax. Mediastinal contours appear intact. Calcified and tortuous aorta. Degenerative changes in the spine. IMPRESSION: No active cardiopulmonary disease. Electronically Signed   By: Lucienne Capers M.D.   On: 03/08/2016 21:04    Procedures Procedures (including critical care time)  Medications Ordered in ED  Medications  gi cocktail (Maalox,Lidocaine,Donnatal) (not administered)      Final Clinical Impressions(s) / ED Diagnoses  2 months of CP ongoing with 2 negative troponins and normal EKG rules out ACS states she is having the pounding in the room with HR of 64 and no ectopy.  I suspect this is GERD as it is worse lying flat.  Follow up with your PMD.  All questions answered to patient's satisfaction. Based on history and exam patient has been appropriately medically screened and emergency conditions excluded. Patient is stable for discharge at this time. Strict return precautions given for any further episodes, persistent fever, weakness or any concerns. New  Prescriptions New Prescriptions New Prescriptions   No medications on file   I personally performed the services described in this documentation, which was scribed in my presence. The recorded information has been reviewed and is accurate.      Veatrice Kells, MD 03/09/16 [id number]

## 2016-03-18 DIAGNOSIS — M79672 Pain in left foot: Secondary | ICD-10-CM | POA: Diagnosis not present

## 2016-03-18 DIAGNOSIS — B079 Viral wart, unspecified: Secondary | ICD-10-CM | POA: Diagnosis not present

## 2016-03-18 DIAGNOSIS — M21622 Bunionette of left foot: Secondary | ICD-10-CM | POA: Diagnosis not present

## 2016-03-18 DIAGNOSIS — M7752 Other enthesopathy of left foot: Secondary | ICD-10-CM | POA: Diagnosis not present

## 2016-03-18 DIAGNOSIS — M25572 Pain in left ankle and joints of left foot: Secondary | ICD-10-CM | POA: Diagnosis not present

## 2016-04-08 DIAGNOSIS — M25572 Pain in left ankle and joints of left foot: Secondary | ICD-10-CM | POA: Diagnosis not present

## 2016-04-08 DIAGNOSIS — M7752 Other enthesopathy of left foot: Secondary | ICD-10-CM | POA: Diagnosis not present

## 2016-04-08 DIAGNOSIS — M79672 Pain in left foot: Secondary | ICD-10-CM | POA: Diagnosis not present

## 2016-04-08 DIAGNOSIS — B079 Viral wart, unspecified: Secondary | ICD-10-CM | POA: Diagnosis not present

## 2016-05-22 DIAGNOSIS — M25572 Pain in left ankle and joints of left foot: Secondary | ICD-10-CM | POA: Diagnosis not present

## 2016-05-22 DIAGNOSIS — M7752 Other enthesopathy of left foot: Secondary | ICD-10-CM | POA: Diagnosis not present

## 2016-06-05 DIAGNOSIS — M810 Age-related osteoporosis without current pathological fracture: Secondary | ICD-10-CM | POA: Diagnosis not present

## 2016-06-05 DIAGNOSIS — I10 Essential (primary) hypertension: Secondary | ICD-10-CM | POA: Diagnosis not present

## 2016-06-05 DIAGNOSIS — E785 Hyperlipidemia, unspecified: Secondary | ICD-10-CM | POA: Diagnosis not present

## 2016-06-05 DIAGNOSIS — R7303 Prediabetes: Secondary | ICD-10-CM | POA: Diagnosis not present

## 2016-06-05 DIAGNOSIS — Z0000 Encounter for general adult medical examination without abnormal findings: Secondary | ICD-10-CM | POA: Diagnosis not present

## 2016-06-06 ENCOUNTER — Other Ambulatory Visit (INDEPENDENT_AMBULATORY_CARE_PROVIDER_SITE_OTHER): Payer: Self-pay | Admitting: Sports Medicine

## 2016-06-12 DIAGNOSIS — B079 Viral wart, unspecified: Secondary | ICD-10-CM | POA: Diagnosis not present

## 2016-06-12 DIAGNOSIS — M79672 Pain in left foot: Secondary | ICD-10-CM | POA: Diagnosis not present

## 2016-06-17 ENCOUNTER — Other Ambulatory Visit: Payer: Self-pay | Admitting: Internal Medicine

## 2016-06-17 DIAGNOSIS — Z1231 Encounter for screening mammogram for malignant neoplasm of breast: Secondary | ICD-10-CM

## 2016-07-03 ENCOUNTER — Other Ambulatory Visit (INDEPENDENT_AMBULATORY_CARE_PROVIDER_SITE_OTHER): Payer: Self-pay | Admitting: Sports Medicine

## 2016-07-05 ENCOUNTER — Ambulatory Visit: Payer: Medicare Other

## 2016-07-10 DIAGNOSIS — B079 Viral wart, unspecified: Secondary | ICD-10-CM | POA: Diagnosis not present

## 2016-07-10 DIAGNOSIS — M79672 Pain in left foot: Secondary | ICD-10-CM | POA: Diagnosis not present

## 2016-08-02 ENCOUNTER — Other Ambulatory Visit (INDEPENDENT_AMBULATORY_CARE_PROVIDER_SITE_OTHER): Payer: Self-pay | Admitting: Sports Medicine

## 2016-08-23 ENCOUNTER — Ambulatory Visit (INDEPENDENT_AMBULATORY_CARE_PROVIDER_SITE_OTHER): Payer: Medicare Other | Admitting: Cardiovascular Disease

## 2016-08-23 ENCOUNTER — Encounter: Payer: Self-pay | Admitting: Cardiovascular Disease

## 2016-08-23 VITALS — BP 120/82 | HR 56 | Ht 63.0 in | Wt 161.8 lb

## 2016-08-23 DIAGNOSIS — I471 Supraventricular tachycardia: Secondary | ICD-10-CM | POA: Diagnosis not present

## 2016-08-23 DIAGNOSIS — I10 Essential (primary) hypertension: Secondary | ICD-10-CM

## 2016-08-23 MED ORDER — METOPROLOL TARTRATE 25 MG PO TABS: 25.0000 mg | ORAL_TABLET | Freq: Two times a day (BID) | ORAL | 11 refills | Status: DC

## 2016-08-23 NOTE — Progress Notes (Signed)
=-  Chief Complaint  Patient presents with  . Follow-up  . Fatigue   History of Present Illness: 71 yo [place] female with history of HTN, HLD here today for cardiac follow up.  She was admitted October 2011 to [facility] with chest pain and ruled out with serial enzymes. She has had c/o palpitations over the years. 48 hour Holter monitor showed sinus rhythm with PACs and shorts runs of SVT. I started Toprol. She then described chest tightness. I arranged a stress myoview on 04/10/11 which showed no ischemia with normal LV function. She has tolerated Toprol.  She had recurrent chest pain in December 2015 and stress myoview showed no ischemia. She was seen in the ED at [facility] June 2017 with chest pain. Troponin was negative. EKG without ischemic changes. Her pain was felt to be GI related.  She was seen again in the [facility] ED February 2018 with chest pain. Troponin and EKG ok.   She is here today for follow up. The patient denies any chest pain, dyspnea, palpitations, lower extremity edema, orthopnea, PND, dizziness, near syncope or syncope. She has fatigue when she bends over.    Primary Care Physician: Antonietta Jewel, MD   Past Medical History:  Diagnosis Date  . Chest pain   . Hyperlipidemia   . Hypertension   . PAC (premature atrial contraction)   . SVT (supraventricular tachycardia) (HCC)     Past Surgical History:  Procedure Laterality Date  . None      Current Outpatient Prescriptions  Medication Sig Dispense Refill  . alendronate (FOSAMAX) 70 MG tablet Take 70 mg by mouth once a week. Take with a full glass of water on an empty stomach. (Every Tuesday)    . calcium-vitamin D (OSCAL WITH D) 500-200 MG-UNIT per tablet Take 1 tablet by mouth daily with breakfast.     . diclofenac sodium (VOLTAREN) 1 % GEL Office visit before next refill. 100 g 0  . famotidine (PEPCID) 20 MG tablet Take 1 tablet (20 mg total) by mouth daily. 30 tablet 11  . meloxicam (MOBIC) 7.5 MG tablet TAKE 1  TABLET BY MOUTH EVERY MORNING AND TAKE 1 TABLET BY MOUTH EVERY DAY AT BEDTIME AS NEEDED (office visit before next refill) 60 tablet 0  . nitroGLYCERIN (NITROSTAT) 0.4 MG SL tablet Place 1 tablet (0.4 mg total) under the tongue every 5 (five) minutes as needed for chest pain. 25 tablet prn  . pravastatin (PRAVACHOL) 40 MG tablet Take 40 mg by mouth daily.    . metoprolol tartrate (LOPRESSOR) 25 MG tablet Take 1 tablet (25 mg total) by mouth 2 (two) times daily. 60 tablet 11   No current facility-administered medications for this visit.     No Known Allergies  Social History   Social History  . Marital status: Divorced    Spouse name: N/A  . Number of children: N/A  . Years of education: N/A   Occupational History  . Not on file.   Social History Main Topics  . Smoking status: Never Smoker  . Smokeless tobacco: Never Used  . Alcohol use No  . Drug use: No  . Sexual activity: Not on file   Other Topics Concern  . Not on file   Social History Narrative   Negative family `history--Specifically artery disease or MI. The patient has two siblings died at a young age from fevers.   The patient is separated and single. 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 She is independent of daily living. She has never drink or smoke or done drugs    History reviewed. No pertinent family history.  Review of Systems:  As stated in the HPI and otherwise negative.   BP 120/82   Pulse (!) 56   Ht 5\' 3"  (1.6 m)   Wt 161 lb 12.8 oz (73.4 kg)   SpO2 96%   BMI 28.66 kg/m   Physical Examination:  General: Well developed, well nourished, NAD  HEENT: OP clear, mucus membranes moist  SKIN: warm, dry. No rashes. Neuro: No focal deficits  Musculoskeletal: Muscle strength 5/5 all ext  Psychiatric: Mood and affect normal  Neck: No JVD, no carotid bruits, no thyromegaly, no lymphadenopathy.  Lungs:Clear bilaterally, no wheezes, rhonci, crackles Cardiovascular: Regular rate  and rhythm. No murmurs, gallops or rubs. Abdomen:Soft. Bowel sounds present. Non-tender.  Extremities: No lower extremity edema. Pulses are 2 + in the bilateral DP/PT.  EKG:  EKG is not ordered today. The ekg ordered today demonstrates   EKG from February 2018 reviewed by me today and shows sinus rhythm without ischemic changes.   Recent Labs: 03/08/2016: BUN 16; Creatinine, Ser 0.88; Hemoglobin 14.3; Platelets 185; Potassium 3.9; Sodium 138   Lipid Panel    Component Value Date/Time   CHOL 208 (H) 03/03/2013 1025   TRIG 75 03/03/2013 1025   HDL 70 03/03/2013 1025   CHOLHDL 3.0 03/03/2013 1025   VLDL 15 03/03/2013 1025   LDLCALC 123 (H) 03/03/2013 1025     Wt Readings from Last 3 Encounters:  08/23/16 161 lb 12.8 oz (73.4 kg)  03/08/16 150 lb (68 kg)  01/08/16 150 lb (68 kg)     Other studies Reviewed: Additional studies/ records that were reviewed today include: . Review of the above records demonstrates:    Assessment and Plan:   1. PVCs/SVT: She has no palpitations. Continue beta blocker.  Will change Lopressor 25 mg po BID  2. HTN: BP controlled.   3. Chest pain: She has no recent chest pain. Her pain has been felt to be atypical. Normal stress test December 2015. EKG normal Feb 2018. Likely related to GERD.    Current medicines are reviewed at length with the patient today.  The patient does not have concerns regarding medicines.  The following changes have been made:  no change  Labs/ tests ordered today include:  No orders of the defined types were placed in this encounter.   Disposition:   FU with me in 12  months  Signed, Lauree Chandler, MD 08/23/2016 11:57 AM    [facility] [place], [place], [place]  [address] Phone: [phone]; Fax: [phone]

## 2016-08-23 NOTE — Patient Instructions (Signed)
Medication Instructions:  Your physician has recommended you make the following change in your medication:  Increase metoprolol tartrate to 25 mg by mouth twice daily   Labwork: none  Testing/Procedures: none  Follow-Up: Your physician recommends that you schedule a follow-up appointment in: 12 months. Please call our office in about 9 months to schedule this appointment.    Any Other Special Instructions Will Be Listed Below (If Applicable).     If you need a refill on your cardiac medications before your next appointment, please call your pharmacy.

## 2016-09-12 ENCOUNTER — Ambulatory Visit: Payer: Medicare Other

## 2016-09-19 ENCOUNTER — Ambulatory Visit
Admission: RE | Admit: 2016-09-19 | Discharge: 2016-09-19 | Disposition: A | Discharge status: Home / Self Care | Payer: Medicare Other | Source: Ambulatory Visit | Attending: Internal Medicine | Admitting: Internal Medicine

## 2016-09-19 DIAGNOSIS — Z1231 Encounter for screening mammogram for malignant neoplasm of breast: Secondary | ICD-10-CM | POA: Diagnosis not present

## 2016-09-24 DIAGNOSIS — E785 Hyperlipidemia, unspecified: Secondary | ICD-10-CM | POA: Diagnosis not present

## 2016-09-24 DIAGNOSIS — R7303 Prediabetes: Secondary | ICD-10-CM | POA: Diagnosis not present

## 2016-09-24 DIAGNOSIS — I10 Essential (primary) hypertension: Secondary | ICD-10-CM | POA: Diagnosis not present

## 2016-09-24 DIAGNOSIS — M810 Age-related osteoporosis without current pathological fracture: Secondary | ICD-10-CM | POA: Diagnosis not present

## 2016-09-24 DIAGNOSIS — H538 Other visual disturbances: Secondary | ICD-10-CM | POA: Diagnosis not present

## 2016-09-24 DIAGNOSIS — Z01118 Encounter for examination of ears and hearing with other abnormal findings: Secondary | ICD-10-CM | POA: Diagnosis not present

## 2016-10-20 DIAGNOSIS — Z23 Encounter for immunization: Secondary | ICD-10-CM | POA: Diagnosis not present

## 2016-10-24 ENCOUNTER — Encounter: Payer: Self-pay | Admitting: Physician Assistant

## 2016-10-24 ENCOUNTER — Ambulatory Visit (INDEPENDENT_AMBULATORY_CARE_PROVIDER_SITE_OTHER): Payer: Medicare Other

## 2016-10-24 ENCOUNTER — Ambulatory Visit (INDEPENDENT_AMBULATORY_CARE_PROVIDER_SITE_OTHER): Payer: Medicare Other | Admitting: Physician Assistant

## 2016-10-24 VITALS — BP 130/80 | HR 69 | Temp 98.9°F | Resp 18 | Ht 63.0 in | Wt 165.4 lb

## 2016-10-24 DIAGNOSIS — R0609 Other forms of dyspnea: Secondary | ICD-10-CM

## 2016-10-24 DIAGNOSIS — M7989 Other specified soft tissue disorders: Secondary | ICD-10-CM | POA: Diagnosis not present

## 2016-10-24 DIAGNOSIS — Z1329 Encounter for screening for other suspected endocrine disorder: Secondary | ICD-10-CM | POA: Diagnosis not present

## 2016-10-24 DIAGNOSIS — R0600 Dyspnea, unspecified: Secondary | ICD-10-CM

## 2016-10-24 DIAGNOSIS — R609 Edema, unspecified: Secondary | ICD-10-CM | POA: Diagnosis not present

## 2016-10-24 DIAGNOSIS — R0602 Shortness of breath: Secondary | ICD-10-CM | POA: Diagnosis not present

## 2016-10-24 MED ORDER — FUROSEMIDE 20 MG PO TABS: 20.0000 mg | ORAL_TABLET | Freq: Every day | ORAL | 0 refills | Status: DC

## 2016-10-24 NOTE — Progress Notes (Signed)
Ariel Harris  MRN: [id number] DOB: February 24, 1945  PCP: Antonietta Jewel, MD  Subjective:  Pt is a 71 year old female PMH HTN, HLD, SVT presents to clinic for leg swelling x 5 days. She speaks [place], mobile interpreter used today. C/o l/l leg swelling. Endorses pain with walking. She walks from parking lot into building she endorses feeling short of breath. She has fatigue when she bends over - this is not a new problem.  She also c/o feeling swollen "all over", including her face, arms and hands.  HTN - controlled with Metoprolol 20m qd.  Denies chest pain, orthopnea, calf pain, cough, palpitations, PND, dizziness, near syncope or syncope.  She started taking Rurapid one month ago. This is a [place] she is taking for arthritis. Ingredients include Indomethacin and B vitamins.   H/o SVT - She sees cardiology Dr. MAngelena Formq yearly. Last OV 08/23/2016 - plan: Continue beta blocker.  Will change Lopressor 25 mg po BID [facility]. She has had c/o palpitations over the years. 48 hour Holter monitor showed sinus rhythm with PACs and shorts runs of SVT. I started Toprol. She then described chest tightness. I arranged a stress myoview on 04/10/11 which showed no ischemia with normal LV function. She has tolerated Toprol.  She had recurrent chest pain in December 2015 and stress myoview showed no ischemia.  Review of Systems  Constitutional: Positive for fatigue. Negative for diaphoresis.  Respiratory: Positive for shortness of breath. Negative for cough, chest tightness, wheezing and stridor.   Cardiovascular: Positive for leg swelling. Negative for chest pain and palpitations.  Gastrointestinal: Negative for nausea.  Skin: Negative.   Psychiatric/Behavioral: Negative for sleep disturbance.    Patient Active Problem List   Diagnosis Date Noted  . History of PSVT 01/13/2014  . Chest pain 03/28/2011  .  Palpitations 03/12/2011  . Dyslipidemia 01/12/2010  . HYPERTENSION 01/12/2010  . CHEST PAIN, ATYPICAL 01/12/2010    Current Outpatient Prescriptions on File Prior to Visit  Medication Sig Dispense Refill  . alendronate (FOSAMAX) 70 MG tablet Take 70 mg by mouth once a week. Take with a full glass of water on an empty stomach. (Every Tuesday)    . metoprolol tartrate (LOPRESSOR) 25 MG tablet Take 1 tablet (25 mg total) by mouth 2 (two) times daily. 60 tablet 11  . pravastatin (PRAVACHOL) 40 MG tablet Take 40 mg by mouth daily.    . calcium-vitamin D (OSCAL WITH D) 500-200 MG-UNIT per tablet Take 1 tablet by mouth daily with breakfast.     . diclofenac sodium (VOLTAREN) 1 % GEL Office visit before next refill. (Patient not taking: Reported on 10/24/2016) 100 g 0  . famotidine (PEPCID) 20 MG tablet Take 1 tablet (20 mg total) by mouth daily. (Patient not taking: Reported on 10/24/2016) 30 tablet 11  . meloxicam (MOBIC) 7.5 MG tablet TAKE 1 TABLET BY MOUTH EVERY MORNING AND TAKE 1 TABLET BY MOUTH EVERY DAY AT BEDTIME AS NEEDED (office visit before next refill) (Patient not taking: Reported on 10/24/2016) 60 tablet 0  . nitroGLYCERIN (NITROSTAT) 0.4 MG SL tablet Place 1 tablet (0.4 mg total) under the tongue every 5 (five) minutes as needed for chest pain. (Patient not taking: Reported on 10/24/2016) 25 tablet prn   No current facility-administered medications on file prior to visit.     No Known Allergies   Objective:  BP 130/80 (BP  Location: Left Arm, Patient Position: Sitting, Cuff Size: Normal)   Pulse 69   Temp 98.9 F (37.2 C) (Oral)   Resp 18   Ht 5' 3" (1.6 m)   Wt 165 lb 6.4 oz (75 kg)   SpO2 96%   BMI 29.30 kg/m   Physical Exam  Constitutional: She is oriented to person, place, and time and well-developed, well-nourished, and in no distress. No distress.  HENT:  Mild periorbital angioedema   Neck: No JVD present.  Cardiovascular: Normal rate, regular rhythm, S1 normal, S2  normal, normal heart sounds, intact distal pulses and normal pulses.  Exam reveals no gallop.   No murmur heard. Pulmonary/Chest: Effort normal and breath sounds normal.  Musculoskeletal:       Right ankle: She exhibits swelling (medial 2+).       Left ankle: She exhibits swelling (medial 2+).       Right lower leg: She exhibits edema (2+). She exhibits no tenderness.       Left lower leg: She exhibits edema (2+ ). She exhibits no tenderness.  No size discrepancy of LES size. There is no warmth or tenderness.   Neurological: She is alert and oriented to person, place, and time. GCS score is 15.  Skin: Skin is warm and dry.  Psychiatric: Mood, memory, affect and judgment normal.  Vitals reviewed.  Dg Chest 2 View  Result Date: 10/24/2016 CLINICAL DATA:  Five day history of shortness of breath with exertion. EXAM: CHEST  2 VIEW COMPARISON:  03/08/2016, 06/29/2015 and earlier. FINDINGS: Cardiac silhouette mildly enlarged, unchanged. Thoracic aorta tortuous and atherosclerotic, unchanged. Hilar and mediastinal contours otherwise unremarkable. Linear atelectasis involving the right middle lobe. Mild scarring involving the lower lobes. Lungs otherwise clear. No localized airspace consolidation. No pleural effusions. No pneumothorax. Normal pulmonary vascularity. Visualized bony thorax intact. IMPRESSION: 1. Mild linear atelectasis involving the right middle lobe. No acute cardiopulmonary disease otherwise. 2. Stable mild cardiomegaly without pulmonary edema. 3.  Aortic Atherosclerosis (ICD10-170.0) Electronically Signed   By: Evangeline Dakin M.D.   On: 10/24/2016 10:56   EKG shows negative precordial waves and sinus bradycardia. Unchanged compared with EKG 03/11/2016 Assessment and Plan :  1. Dyspnea on exertion - EKG 12-Lead - Brain natriuretic peptide - DG Chest 2 View; Future  2. Edema, unspecified type 3. Leg swelling - furosemide (LASIX) 20 MG tablet; Take 1 tablet (20 mg total) by mouth  daily.  Dispense: 7 tablet; Refill: 0 - CMP14+EGFR - CBC with Differential/Platelet - X-ray shows stable cardio megaly, unchanged from xray 02/2016. EKG shows negative precordial waves and sinus bradycardia. Unchanged compared with EKG 03/11/2016. She is evaluated by her cardiologist yearly, last OV 08/23/2016.  No findings today suggestive of acute processes. Labs are pending.  - She started taking Rurapid one month ago. This is a [place] medication she is taking for arthritis. Ingredients include Indomethacin. Suspect edema possibly 2/2 medication reaction. Plan to have pt d/c Rurapid. Start taking tylenol for pain. Wear compression stockings, elevate feet bid. Lasix x 1 week. RTC in 1 week for recheck. Have pt f/u with her cardiologist if no improvement.    Mercer Pod, PA-C  [facility] 10/24/2016 9:34 AM

## 2016-10-24 NOTE — Patient Instructions (Addendum)
D?ng l?y Rurapid c?a b?n. B?n c th? c ph?n ?ng v?i thu?c ny, gy s?ng. B?t ??u dng Tylenol ?? gi?m ?au. N?u cc tri?u ch?ng c?a b?n khng c?i thi?n sau 3-4 tu?n, hy h?n v?i bc s? tim m?ch c?a b?n. b?t ??u ?eo v? nn. Buy these at [place] ch[facility] c?a b?n trn m?c ?? c?a tri tim c?a b?n 1-2 l?n m?i ngy lasix - l?y n vo bu?i sng quay l?i v g?p ti sau 7 ngy - b?n c th? th?c hi?n cu?c h?n ti?p theo ngay hm nay khi tr? phng  Stop taking your Rurapid. You may be having a reaction to this medication, causing your swelling. Start taking Tylenol for pain. If your symptoms do not improve after 3-4 weeks, make an appointment with your cardiologist.    Wear compression socks Elevate your feet above the level of your heart 1-2 times daily Come back in 1 week  Thank you for coming in today. I hope you feel we met your needs.  Feel free to call PCP if you have any questions or further requests.  Please consider signing up for MyChart if you do not already have it, as this is a great way to communicate with me.  Best,  Whitney McVey, PA-C   IF you received an x-ray today, you will receive an invoice from [facility]. Please contact [facility] at [phone] with questions or concerns regarding your invoice.   IF you received labwork today, you will receive an invoice from [facility]. Please contact [facility] at [phone] with questions or concerns regarding your invoice.   Our billing staff will not be able to assist you with questions regarding bills from these companies.  You will be contacted with the lab results as soon as they are available. The fastest way to get your results is to activate your My Chart account. Instructions are located on the last page of this paperwork. If you have not heard from [place] regarding the results in 2 weeks, please contact this office.

## 2016-10-25 LAB — CMP14+EGFR
ALT: 27 IU/L (ref 0–32)
AST: 32 IU/L (ref 0–40)
Albumin/Globulin Ratio: 1.3 (ref 1.2–2.2)
Albumin: 3.9 g/dL (ref 3.6–4.8)
Alkaline Phosphatase: 31 IU/L — ABNORMAL LOW (ref 39–117)
BUN/Creatinine Ratio: 21 (ref 12–28)
BUN: 20 mg/dL (ref 8–27)
Bilirubin Total: 0.7 mg/dL (ref 0.0–1.2)
CO2: 23 mmol/L (ref 20–29)
Calcium: 9.2 mg/dL (ref 8.7–10.3)
Chloride: 106 mmol/L (ref 96–106)
Creatinine, Ser: 0.94 mg/dL (ref 0.57–1.00)
GFR calc Af Amer: 72 mL/min/{1.73_m2} (ref >59)
GFR calc non Af Amer: 62 mL/min/{1.73_m2} (ref >59)
Globulin, Total: 3 g/dL (ref 1.5–4.5)
Glucose: 94 mg/dL (ref 65–99)
Potassium: 4.3 mmol/L (ref 3.5–5.2)
Sodium: 142 mmol/L (ref 134–144)
Total Protein: 6.9 g/dL (ref 6.0–8.5)

## 2016-10-25 LAB — CBC WITH DIFFERENTIAL/PLATELET
Basophils Absolute: 0 10*3/uL (ref 0.0–0.2)
Basos: 0 %
EOS (ABSOLUTE): 0.3 10*3/uL (ref 0.0–0.4)
Eos: 6 %
Hematocrit: 40.7 % (ref 34.0–46.6)
Hemoglobin: 13.9 g/dL (ref 11.1–15.9)
Immature Grans (Abs): 0 10*3/uL (ref 0.0–0.1)
Immature Granulocytes: 0 %
Lymphocytes Absolute: 1.6 10*3/uL (ref 0.7–3.1)
Lymphs: 36 %
MCH: 30.2 pg (ref 26.6–33.0)
MCHC: 34.2 g/dL (ref 31.5–35.7)
MCV: 89 fL (ref 79–97)
Monocytes Absolute: 0.3 10*3/uL (ref 0.1–0.9)
Monocytes: 7 %
Neutrophils Absolute: 2.3 10*3/uL (ref 1.4–7.0)
Neutrophils: 51 %
Platelets: 178 10*3/uL (ref 150–379)
RBC: 4.6 x10E6/uL (ref 3.77–5.28)
RDW: 13.7 % (ref 12.3–15.4)
WBC: 4.6 10*3/uL (ref 3.4–10.8)

## 2016-10-25 LAB — BRAIN NATRIURETIC PEPTIDE: BNP: 135.6 pg/mL — ABNORMAL HIGH (ref 0.0–100.0)

## 2016-11-01 ENCOUNTER — Ambulatory Visit (INDEPENDENT_AMBULATORY_CARE_PROVIDER_SITE_OTHER): Payer: Medicare Other | Admitting: Physician Assistant

## 2016-11-01 ENCOUNTER — Ambulatory Visit (INDEPENDENT_AMBULATORY_CARE_PROVIDER_SITE_OTHER): Payer: Medicare Other

## 2016-11-01 ENCOUNTER — Encounter: Payer: Self-pay | Admitting: Physician Assistant

## 2016-11-01 VITALS — BP 110/72 | HR 69 | Temp 98.3°F | Resp 16 | Ht 62.0 in | Wt 160.6 lb

## 2016-11-01 DIAGNOSIS — M1711 Unilateral primary osteoarthritis, right knee: Secondary | ICD-10-CM | POA: Insufficient documentation

## 2016-11-01 DIAGNOSIS — M25562 Pain in left knee: Secondary | ICD-10-CM | POA: Diagnosis not present

## 2016-11-01 DIAGNOSIS — M25561 Pain in right knee: Secondary | ICD-10-CM | POA: Diagnosis not present

## 2016-11-01 DIAGNOSIS — M170 Bilateral primary osteoarthritis of knee: Secondary | ICD-10-CM | POA: Diagnosis not present

## 2016-11-01 DIAGNOSIS — M179 Osteoarthritis of knee, unspecified: Secondary | ICD-10-CM | POA: Diagnosis not present

## 2016-11-01 MED ORDER — NAPROXEN 500 MG PO TABS: 500.0000 mg | ORAL_TABLET | Freq: Two times a day (BID) | ORAL | 1 refills | Status: DC

## 2016-11-01 MED ORDER — TROLAMINE SALICYLATE 10 % EX CREA: 1.0000 "application " | TOPICAL_CREAM | CUTANEOUS | 0 refills | Status: DC | PRN

## 2016-11-01 NOTE — Progress Notes (Signed)
Ariel Harris  MRN: [id number] DOB: 12-10-1945  PCP: Antonietta Jewel, MD  Subjective:  Pt is a 71 year old female who presents to clinic for f/u leg pain. She speaks [place], mobile interpreter used today [phone].  She was here 9/27 for leg swelling. also c/o feeling swollen "all over", including her face, arms and hands.  Advised d/c Rurapid - a [place] medication for arthritis. Ingredients include Indomethacin and B vitamins. Suspected this was causing her edema.   Today she states her swelling 75% better. She is no longer taking Rurapid. Today she is c/o b/l knee pain. Worse with walking. This has been a problem for her for about long time ago. She has received knee injections for this with orthopedics. Relief lasts a few weeks. She has never tried knee braces.  B/l knee surgery about 20 years ago.  C/o short of breath if walking long distances. This has been a problem for her for a long time. She takes medication for this - makes it better. This problem is not worsening. She has been evaluated by cardiologist for this problems.  H/o SVT - She sees cardiology Dr. Angelena Form q yearly. Last OV 08/23/2016.   Review of Systems  Constitutional: Negative for fatigue.  Respiratory: Positive for shortness of breath. Negative for chest tightness and wheezing.   Cardiovascular: Negative for chest pain, palpitations and leg swelling.    Patient Active Problem List   Diagnosis Date Noted  . History of PSVT 01/13/2014  . Chest pain 03/28/2011  . Palpitations 03/12/2011  . Dyslipidemia 01/12/2010  . HYPERTENSION 01/12/2010  . CHEST PAIN, ATYPICAL 01/12/2010    Current Outpatient Prescriptions on File Prior to Visit  Medication Sig Dispense Refill  . alendronate (FOSAMAX) 70 MG tablet Take 70 mg by mouth once a week. Take with a full glass of water on an empty stomach. (Every Tuesday)    . metoprolol tartrate (LOPRESSOR) 25 MG tablet Take 1 tablet (25 mg total) by mouth 2 (two) times  daily. 60 tablet 11  . pravastatin (PRAVACHOL) 40 MG tablet Take 40 mg by mouth daily.    . furosemide (LASIX) 20 MG tablet Take 1 tablet (20 mg total) by mouth daily. 7 tablet 0   No current facility-administered medications on file prior to visit.     No Known Allergies   Objective:  BP 110/72   Pulse 69   Temp 98.3 F (36.8 C) (Oral)   Resp 16   Ht 5\' 2"  (1.575 m)   Wt 160 lb 9.6 oz (72.8 kg)   SpO2 96%   BMI 29.37 kg/m   Physical Exam  Constitutional: She is oriented to person, place, and time and well-developed, well-nourished, and in no distress. No distress.  Cardiovascular: Normal rate, regular rhythm and normal heart sounds.   Musculoskeletal:       Right knee: Tenderness found. Medial joint line and lateral joint line tenderness noted.       Left knee: Tenderness found. Medial joint line and lateral joint line tenderness noted.       Right ankle: She exhibits no swelling.       Left ankle: She exhibits no swelling.       Right lower leg: She exhibits no swelling and no edema.       Left lower leg: She exhibits no swelling and no edema.  Neurological: She is alert and oriented to person, place, and time. GCS score is 15.  Skin: Skin is warm  and dry.  Psychiatric: Mood, memory, affect and judgment normal.  Vitals reviewed.   Dg Knee Complete 4 Views Left  Result Date: 11/01/2016 CLINICAL DATA:  Bilateral knee pain for 1 year EXAM: LEFT KNEE - COMPLETE 4+ VIEW COMPARISON:  None. FINDINGS: No acute fracture. No dislocation. Advanced tricompartment osteoarthritic change. Small bony density projects over the posterior knee joint on the lateral view. Loose body is not excluded. IMPRESSION: No acute bony pathology.  Chronic changes are noted. Electronically Signed   By: Marybelle Killings M.D.   On: 11/01/2016 11:12   Dg Knee Complete 4 Views Right  Result Date: 11/01/2016 CLINICAL DATA:  Left knee pain. EXAM: RIGHT KNEE - COMPLETE 4+ VIEW COMPARISON:  None. FINDINGS: There is  moderately severe tricompartmental osteoarthritis, most prominent in the medial and patellofemoral compartments. Small joint effusion. No fracture or bone destruction. IMPRESSION: Moderately severe osteoarthritis of the right knee. Electronically Signed   By: Lorriane Shire M.D.   On: 11/01/2016 11:19    Assessment and Plan :  1. Pain in both knees, unspecified chronicity 2. Osteoarthritis of both knees, unspecified osteoarthritis type - DG Knee Complete 4 Views Left; Future - DG Knee Complete 4 Views Right; Future - naproxen (NAPROSYN) 500 MG tablet; Take 1 tablet (500 mg total) by mouth 2 (two) times daily with a meal.  Dispense: 60 tablet; Refill: 1 - trolamine salicylate (ASPERCREME/ALOE) 10 % cream; Apply 1 application topically as needed (knee pain).  Dispense: 85 g; Refill: 0 - Improvement of swelling. C/o chronic knee pain. B/l knee x-ray shows severe osteoarthritis. She reports improvement in pain after knee brace placement. RTC if symptoms worsen. Advised pt make appt with ortho for injections if needed.   Mercer Pod, PA-C  [facility] 11/01/2016 10:30 AM

## 2016-11-01 NOTE — Patient Instructions (Addendum)
You may take Naprosyn and Advil together for your knee pain.  Naprosyn is an NSAID Do not use with any other otc pain medication other than tylenol/acetaminophen - so no aleve, ibuprofen, motrin, advil, etc.  Please schedule an appointment with your knee doctor for injections if needed.   Thank you for coming in today. I hope you feel we met your needs.  Feel free to call PCP if you have any questions or further requests.  Please consider signing up for MyChart if you do not already have it, as this is a great way to communicate with me.  Best,  Whitney McVey, PA-C   IF you received an x-ray today, you will receive an invoice from [facility]. Please contact [facility] at [phone] with questions or concerns regarding your invoice.   IF you received labwork today, you will receive an invoice from [facility]. Please contact [facility] at [phone] with questions or concerns regarding your invoice.   Our billing staff will not be able to assist you with questions regarding bills from these companies.  You will be contacted with the lab results as soon as they are available. The fastest way to get your results is to activate your My Chart account. Instructions are located on the last page of this paperwork. If you have not heard from us regarding the results in 2 weeks, please contact this office.     

## 2016-12-23 DIAGNOSIS — Z23 Encounter for immunization: Secondary | ICD-10-CM | POA: Diagnosis not present

## 2016-12-24 DIAGNOSIS — I10 Essential (primary) hypertension: Secondary | ICD-10-CM | POA: Diagnosis not present

## 2016-12-24 DIAGNOSIS — E785 Hyperlipidemia, unspecified: Secondary | ICD-10-CM | POA: Diagnosis not present

## 2016-12-24 DIAGNOSIS — H538 Other visual disturbances: Secondary | ICD-10-CM | POA: Diagnosis not present

## 2016-12-24 DIAGNOSIS — M810 Age-related osteoporosis without current pathological fracture: Secondary | ICD-10-CM | POA: Diagnosis not present

## 2016-12-24 DIAGNOSIS — R7303 Prediabetes: Secondary | ICD-10-CM | POA: Diagnosis not present

## 2016-12-24 DIAGNOSIS — Z01118 Encounter for examination of ears and hearing with other abnormal findings: Secondary | ICD-10-CM | POA: Diagnosis not present

## 2016-12-24 DIAGNOSIS — Z131 Encounter for screening for diabetes mellitus: Secondary | ICD-10-CM | POA: Diagnosis not present

## 2017-01-06 ENCOUNTER — Ambulatory Visit (INDEPENDENT_AMBULATORY_CARE_PROVIDER_SITE_OTHER): Payer: Medicare Other | Admitting: Orthopaedic Surgery

## 2017-01-10 ENCOUNTER — Encounter (HOSPITAL_COMMUNITY): Payer: Self-pay | Admitting: Emergency Medicine

## 2017-01-10 ENCOUNTER — Ambulatory Visit (HOSPITAL_COMMUNITY)
Admission: EM | Admit: 2017-01-10 | Discharge: 2017-01-10 | Disposition: A | Discharge status: Home / Self Care | Payer: Medicare Other | Attending: Internal Medicine | Admitting: Internal Medicine

## 2017-01-10 DIAGNOSIS — R0789 Other chest pain: Secondary | ICD-10-CM

## 2017-01-10 NOTE — ED Provider Notes (Signed)
[facility]    CSN: [id number] Arrival date & time: 01/10/17  1429     History   Chief Complaint Chief Complaint  Patient presents with  . Chest Pain    HPI Ariel Harris is a 71 y.o. female.   71 year old [place] female is complaining of left anterior chest pain sometimes radiates into the left lateral chest and left upper arm. This has been occurring for 3 years. She has been evaluated in the emergency department, primary care provider and per patient cardiologist. She has had stress test, troponin levels, chest x-ray and per patient a CT scan and all of these have been normal. There have been no abnormalities found and cardiovascular or pulmonary systems. The pain is described as needlelike. It is worse with movement of the arms, lying supine. She is also complaining of numbness in both hands.  Advil helps relieve the pain in the chest.      Past Medical History:  Diagnosis Date  . Chest pain   . Hyperlipidemia   . Hypertension   . PAC (premature atrial contraction)   . SVT (supraventricular tachycardia) [facility])     Patient Active Problem List   Diagnosis Date Noted  . Osteoarthritis of both knees 11/01/2016  . History of PSVT 01/13/2014  . Chest pain 03/28/2011  . Palpitations 03/12/2011  . Dyslipidemia 01/12/2010  . HYPERTENSION 01/12/2010  . CHEST PAIN, ATYPICAL 01/12/2010    Past Surgical History:  Procedure Laterality Date  . None      OB History    No data available       Home Medications    Prior to Admission medications   Medication Sig Start Date End Date Taking? Authorizing Provider  alendronate (FOSAMAX) 70 MG tablet Take 70 mg by mouth once a week. Take with a full glass of water on an empty stomach. (Every Tuesday)   Yes [provider]  pravastatin (PRAVACHOL) 40 MG tablet Take 40 mg by mouth daily.   Yes [provider]  furosemide (LASIX) 20 MG tablet Take 1 tablet (20 mg total) by mouth daily. 10/24/16  10/31/16  McVey, [name] Mink, PA-C  metoprolol tartrate (LOPRESSOR) 25 MG tablet Take 1 tablet (25 mg total) by mouth 2 (two) times daily. 08/23/16 11/21/16  Burnell Blanks, MD  naproxen (NAPROSYN) 500 MG tablet Take 1 tablet (500 mg total) by mouth 2 (two) times daily with a meal. 11/01/16   McVey, [name] Mink, PA-C  trolamine salicylate (ASPERCREME/ALOE) 10 % cream Apply 1 application topically as needed (knee pain). 11/01/16   McVey, [name] Mink, PA-C    Family History History reviewed. No pertinent family history.  Social History Social History   Tobacco Use  . Smoking status: Never Smoker  . Smokeless tobacco: Never Used  Substance Use Topics  . Alcohol use: No  . Drug use: No     Allergies   Indomethacin   Review of Systems Review of Systems  Constitutional: Negative.   HENT: Negative.   Respiratory: Negative.   Cardiovascular: Positive for chest pain.  Gastrointestinal: Negative.   Genitourinary: Negative.   Skin: Negative.   Neurological: Positive for dizziness and numbness.  All other systems reviewed and are negative.    Physical Exam Triage Vital Signs ED Triage Vitals  Enc Vitals Group     BP 01/10/17 1504 130/80     Pulse Rate 01/10/17 1504 (!) 54     Resp 01/10/17 1504 16     Temp  01/10/17 1504 98.1 F (36.7 C)     Temp Source 01/10/17 1504 Oral     SpO2 01/10/17 1504 96 %     Weight --      Height --      Head Circumference --      Peak Flow --      Pain Score 01/10/17 1505 6     Pain Loc --      Pain Edu? --      Excl. in [place]? --    No data found.  Updated Vital Signs BP 130/80 (BP Location: Right Arm)   Pulse (!) 54   Temp 98.1 F (36.7 C) (Oral)   Resp 16   SpO2 96%   Visual Acuity Right Eye Distance:   Left Eye Distance:   Bilateral Distance:    Right Eye Near:   Left Eye Near:    Bilateral Near:     Physical Exam  Constitutional: She is oriented to person, place, and time. She appears  well-developed and well-nourished.  Non-toxic appearance. She does not appear ill. No distress.  Eyes: EOM are normal. Pupils are equal, round, and reactive to light.  Neck: Normal range of motion. Neck supple. No JVD present.  Cardiovascular: Normal rate, regular rhythm and normal heart sounds.  Pulses:      Radial pulses are 2+ on the right side, and 2+ on the left side.  Pulmonary/Chest: Effort normal and breath sounds normal.  Abdominal: Soft. There is no tenderness.  Musculoskeletal: Normal range of motion. She exhibits no edema.  Patient is able to perform a bladder fly movement as well as bring her arms down against resistance. This reproduces some pain in the left chest anterior and lateral aspects. Positive for tenderness to the left anterior chest wall.  Lymphadenopathy:    She has no cervical adenopathy.  Neurological: She is alert and oriented to person, place, and time. No cranial nerve deficit.  Skin: Skin is warm and dry. She is not diaphoretic.  Nursing note and vitals reviewed.    UC Treatments / Results  Labs (all labs ordered are listed, but only abnormal results are displayed) Labs Reviewed - No data to display  EKG  EKG Interpretation None       Radiology No results found.  Procedures Procedures (including critical care time)  Medications Ordered in UC Medications - No data to display   Initial Impression / Assessment and Plan / UC Course  I have reviewed the triage vital signs and the nursing notes.  Pertinent labs & imaging results that were available during my care of the patient were reviewed by me and considered in my medical decision making (see chart for details).    Continue to take Advil as needed for pain. Place ice against the chest with inflammation. Follow-up with your primary care provider    Final Clinical Impressions(s) / UC Diagnoses   Final diagnoses:  Chest wall pain    ED Discharge Orders    None       Controlled  Substance Prescriptions [place] Controlled Substance Registry consulted? Not Applicable   Janne Napoleon, NP 01/10/17 1606

## 2017-01-10 NOTE — ED Triage Notes (Signed)
PT C/O: via 71 y/o daughter who speaks [place], pt c/o intermittent CP... Reports she's been to ER for similar sx.   ONSET: 2-3 years  SX ALSO INCLUDE: reports pain increases when lying down; also increases w/activity   DENIES:   TAKING MEDS: none  A&O x4... NAD... Ambulatory

## 2017-01-10 NOTE — Discharge Instructions (Signed)
Continue to take Advil as needed for pain. Place ice against the chest with inflammation. Follow-up with your primary care provider

## 2017-01-15 ENCOUNTER — Other Ambulatory Visit: Payer: Self-pay | Admitting: *Deleted

## 2017-01-15 ENCOUNTER — Other Ambulatory Visit: Payer: Self-pay | Admitting: Physician Assistant

## 2017-01-15 DIAGNOSIS — M25561 Pain in right knee: Secondary | ICD-10-CM

## 2017-01-15 DIAGNOSIS — M25562 Pain in left knee: Principal | ICD-10-CM

## 2017-01-15 MED ORDER — NAPROXEN 500 MG PO TABS: 500.0000 mg | ORAL_TABLET | Freq: Two times a day (BID) | ORAL | 1 refills | Status: DC

## 2017-02-24 ENCOUNTER — Encounter (INDEPENDENT_AMBULATORY_CARE_PROVIDER_SITE_OTHER): Payer: Self-pay | Admitting: Orthopaedic Surgery

## 2017-02-24 ENCOUNTER — Ambulatory Visit (INDEPENDENT_AMBULATORY_CARE_PROVIDER_SITE_OTHER): Payer: Medicare Other | Admitting: Orthopaedic Surgery

## 2017-02-24 DIAGNOSIS — M1711 Unilateral primary osteoarthritis, right knee: Secondary | ICD-10-CM | POA: Diagnosis not present

## 2017-02-24 DIAGNOSIS — M1712 Unilateral primary osteoarthritis, left knee: Secondary | ICD-10-CM | POA: Diagnosis not present

## 2017-02-24 MED ORDER — METHYLPREDNISOLONE ACETATE 40 MG/ML IJ SUSP
40.0000 mg | INTRAMUSCULAR | Status: AC | PRN
  Administered 2017-02-24: 40 mg via INTRA_ARTICULAR

## 2017-02-24 MED ORDER — DICLOFENAC SODIUM 1 % TD GEL: 2.0000 g | Freq: Four times a day (QID) | TRANSDERMAL | 5 refills | Status: DC

## 2017-02-24 MED ORDER — LIDOCAINE HCL 1 % IJ SOLN
2.0000 mL | INTRAMUSCULAR | Status: AC | PRN
  Administered 2017-02-24: 2 mL

## 2017-02-24 MED ORDER — BUPIVACAINE HCL 0.5 % IJ SOLN
2.0000 mL | INTRAMUSCULAR | Status: AC | PRN
  Administered 2017-02-24: 2 mL via INTRA_ARTICULAR

## 2017-02-24 MED ORDER — MELOXICAM 7.5 MG PO TABS: 7.5000 mg | ORAL_TABLET | Freq: Two times a day (BID) | ORAL | 2 refills | Status: DC | PRN

## 2017-02-24 NOTE — Progress Notes (Signed)
Office Visit Note   Patient: Ariel Harris           Date of Birth: 03/22/1945           MRN: [id number] Visit Date: 02/24/2017              Requested by: Antonietta Jewel, MD [address] [place], [place] [address] PCP: Antonietta Jewel, MD   Assessment & Plan: Visit Diagnoses:  1. Unilateral primary osteoarthritis, left knee   2. Unilateral primary osteoarthritis, right knee     Plan: Impression is 72 year old female with advanced bilateral knee degenerative joint disease.  Bilateral cortisone injections were performed today.  Prescription for meloxicam and Voltaren gel.  Today's encounter was performed through an interpreter which did add to the complexity.  Questions encouraged and answered.  Follow-up as needed.  We briefly discussed knee replacement surgery and the associated risk benefits alternatives to surgery including the expected recovery time.  Follow-Up Instructions: Return if symptoms worsen or fail to improve.   Orders:  No orders of the defined types were placed in this encounter.  Meds ordered this encounter  Medications  . diclofenac sodium (VOLTAREN) 1 % GEL    Sig: Apply 2 g topically 4 (four) times daily.    Dispense:  1 Tube    Refill:  5  . meloxicam (MOBIC) 7.5 MG tablet    Sig: Take 1 tablet (7.5 mg total) by mouth 2 (two) times daily as needed for pain.    Dispense:  30 tablet    Refill:  2      Procedures: Large Joint Inj: bilateral knee on 02/24/2017 10:26 AM Indications: pain Details: 22 G needle  Arthrogram: No  Medications (Right): 2 mL lidocaine 1 %; 2 mL bupivacaine 0.5 %; 40 mg methylPREDNISolone acetate 40 MG/ML Medications (Left): 2 mL lidocaine 1 %; 2 mL bupivacaine 0.5 %; 40 mg methylPREDNISolone acetate 40 MG/ML Outcome: tolerated well, no immediate complications Patient was prepped and draped in the usual sterile fashion.       Clinical Data: No additional findings.   Subjective: Chief Complaint  Patient presents with   . Right Knee - Pain  . Left Knee - Pain    Patient is a 72 year old [place] lady who comes in for consultation of bilateral knee pain.  She received cortisone injections approximately 5-6 months ago with good relief.  She is having significant difficulty with ADLs and pain.  The pain is worse with activity and prolonged standing.  Denies any numbness or tingling or back pain.  Denies any injuries.    Review of Systems  Constitutional: Negative.   HENT: Negative.   Eyes: Negative.   Respiratory: Negative.   Cardiovascular: Negative.   Endocrine: Negative.   Musculoskeletal: Negative.   Neurological: Negative.   Hematological: Negative.   Psychiatric/Behavioral: Negative.   All other systems reviewed and are negative.    Objective: Vital Signs: There were no vitals taken for this visit.  Physical Exam  Constitutional: She is oriented to person, place, and time. She appears well-developed and well-nourished.  HENT:  Head: Normocephalic and atraumatic.  Eyes: EOM are normal.  Neck: Neck supple.  Pulmonary/Chest: Effort normal.  Abdominal: Soft.  Neurological: She is alert and oriented to person, place, and time.  Skin: Skin is warm. Capillary refill takes less than 2 seconds.  Psychiatric: She has a normal mood and affect. Her behavior is normal. Judgment and thought content normal.  Nursing note and vitals  reviewed.   Ortho Exam Bilateral knee exam shows varus alignment.  No joint effusion.  Well-preserved joint motion.  Collaterals and cruciates are stable. Specialty Comments:  No specialty comments available.  Imaging: No results found.   PMFS History: Patient Active Problem List   Diagnosis Date Noted  . Osteoarthritis of both knees 11/01/2016  . History of PSVT 01/13/2014  . Chest pain 03/28/2011  . Palpitations 03/12/2011  . Dyslipidemia 01/12/2010  . HYPERTENSION 01/12/2010  . CHEST PAIN, ATYPICAL 01/12/2010   Past Medical History:  Diagnosis Date    . Chest pain   . Hyperlipidemia   . Hypertension   . PAC (premature atrial contraction)   . SVT (supraventricular tachycardia) ([place])     History reviewed. No pertinent family history.  Past Surgical History:  Procedure Laterality Date  . None     Social History   Occupational History  . Not on file  Tobacco Use  . Smoking status: Never Smoker  . Smokeless tobacco: Never Used  Substance and Sexual Activity  . Alcohol use: No  . Drug use: No  . Sexual activity: Not on file

## 2017-02-25 ENCOUNTER — Telehealth (INDEPENDENT_AMBULATORY_CARE_PROVIDER_SITE_OTHER): Payer: Self-pay | Admitting: Orthopaedic Surgery

## 2017-02-25 ENCOUNTER — Other Ambulatory Visit (INDEPENDENT_AMBULATORY_CARE_PROVIDER_SITE_OTHER): Payer: Self-pay | Admitting: Physician Assistant

## 2017-02-25 MED ORDER — TRAMADOL HCL 50 MG PO TABS: 50.0000 mg | ORAL_TABLET | Freq: Four times a day (QID) | ORAL | 0 refills | Status: DC | PRN

## 2017-02-25 NOTE — Telephone Encounter (Signed)
Just printed.  I will bring to you.

## 2017-02-25 NOTE — Telephone Encounter (Signed)
Will you have her to stop the meloxicam.  I am happy to write a rx for tramadol if she would like

## 2017-02-25 NOTE — Telephone Encounter (Signed)
Patient's granddaughter called stating that the patient took the medication 2 times today and it made her face red and warm.  CB#[phone].  She would like someone to call her.  Thank you.

## 2017-02-25 NOTE — Telephone Encounter (Signed)
Meloxicam was given. See message below.

## 2017-02-25 NOTE — Telephone Encounter (Signed)
Called patient. She would like Rx for Tramadol.  Thank you.

## 2017-02-26 NOTE — Telephone Encounter (Signed)
Rx ready for pick up. 

## 2017-03-05 ENCOUNTER — Telehealth: Payer: Self-pay | Admitting: *Deleted

## 2017-03-05 NOTE — Telephone Encounter (Signed)
Per [facility] pharmacy request for Naproxen 500 mg denied. Pt  was given Meloxicam 7.5 mg and Diclofenac gel by her doctor on 02/24/17. I spoke with the patient and she stated that "she is okay now."

## 2017-04-07 ENCOUNTER — Other Ambulatory Visit (INDEPENDENT_AMBULATORY_CARE_PROVIDER_SITE_OTHER): Payer: Self-pay | Admitting: Orthopaedic Surgery

## 2017-04-09 ENCOUNTER — Ambulatory Visit (INDEPENDENT_AMBULATORY_CARE_PROVIDER_SITE_OTHER): Payer: Medicare Other | Admitting: Physician Assistant

## 2017-04-09 ENCOUNTER — Other Ambulatory Visit: Payer: Self-pay

## 2017-04-09 ENCOUNTER — Encounter: Payer: Self-pay | Admitting: Physician Assistant

## 2017-04-09 VITALS — BP 113/68 | HR 60 | Temp 98.6°F | Resp 16 | Ht 62.0 in | Wt 162.0 lb

## 2017-04-09 DIAGNOSIS — M170 Bilateral primary osteoarthritis of knee: Secondary | ICD-10-CM | POA: Diagnosis not present

## 2017-04-09 DIAGNOSIS — Z130 Encounter for screening for diseases of the blood and blood-forming organs and certain disorders involving the immune mechanism: Secondary | ICD-10-CM

## 2017-04-09 DIAGNOSIS — E785 Hyperlipidemia, unspecified: Secondary | ICD-10-CM | POA: Diagnosis not present

## 2017-04-09 DIAGNOSIS — Z1329 Encounter for screening for other suspected endocrine disorder: Secondary | ICD-10-CM | POA: Diagnosis not present

## 2017-04-09 DIAGNOSIS — I10 Essential (primary) hypertension: Secondary | ICD-10-CM

## 2017-04-09 MED ORDER — MELOXICAM 7.5 MG PO TABS: ORAL_TABLET | ORAL | 0 refills | Status: DC

## 2017-04-09 MED ORDER — METOPROLOL TARTRATE 25 MG PO TABS: 25.0000 mg | ORAL_TABLET | Freq: Two times a day (BID) | ORAL | 11 refills | Status: DC

## 2017-04-09 MED ORDER — PRAVASTATIN SODIUM 40 MG PO TABS: 40.0000 mg | ORAL_TABLET | Freq: Every day | ORAL | 3 refills | Status: DC

## 2017-04-09 NOTE — Patient Instructions (Addendum)
  Be sure you are taking calcium and vitamin D supplements while you are taking Alendronate Vitamin D - Take 600 to 800 international units of vitamin D a day  Calcium- Maintain a daily total calcium intake (diet plus supplement) of 1,200 mg/day.    What foods and drinks have vitamin D?-Foods and drinks that have a lot of vitamin D include: -Milk, orange juice, or yogurt with vitamin D added -Cooked salmon or mackerel -Canned tuna fish -Cereals with vitamin D added -Cod liver oil People's bodies can also get vitamin D from the sun. The body uses sunlight that shines on the skin to make vitamin D.  Thank you for coming in today. I hope you feel we met your needs.  Feel free to call PCP if you have any questions or further requests.  Please consider signing up for MyChart if you do not already have it, as this is a great way to communicate with me.  Best,  Whitney McVey, PA-C  IF you received an x-ray today, you will receive an invoice from [facility]. Please contact [facility] at [phone] with questions or concerns regarding your invoice.   IF you received labwork today, you will receive an invoice from [facility]. Please contact [facility] at [phone] with questions or concerns regarding your invoice.   Our billing staff will not be able to assist you with questions regarding bills from these companies.  You will be contacted with the lab results as soon as they are available. The fastest way to get your results is to activate your My Chart account. Instructions are located on the last page of this paperwork. If you have not heard from [place] regarding the results in 2 weeks, please contact this office.

## 2017-04-09 NOTE — Progress Notes (Signed)
Ariel Harris  MRN: [id number] DOB: [birth year]  PCP: Antonietta Jewel, MD  Subjective:  Pt is a 72 year old female PMH HTN, HLD who presents to clinic for medication refill. She is feeling well today.   B/l knee pain - meloxicam 7.74m once daily. She takes this PRN.   HLD - pravastatin 428mqd.   HTN - controlled with Metoprolol 2554mid. Blood pressure today is 113/68. Denies cardiac symptoms. LES swelling has resolved.  H/o SVT - She sees cardiology Dr. McAAngelena Formyearly. Last OV 07/2016  Review of Systems  Constitutional: Negative for chills, diaphoresis, fatigue and fever.  Respiratory: Negative for cough, chest tightness, shortness of breath and wheezing.   Cardiovascular: Negative for chest pain, palpitations and leg swelling.  Gastrointestinal: Negative for abdominal pain, diarrhea, nausea and vomiting.  Neurological: Negative for weakness, light-headedness and headaches.    Patient Active Problem List   Diagnosis Date Noted  . Osteoarthritis of both knees 11/01/2016  . History of PSVT 01/13/2014  . Chest pain 03/28/2011  . Palpitations 03/12/2011  . Dyslipidemia 01/12/2010  . HYPERTENSION 01/12/2010  . CHEST PAIN, ATYPICAL 01/12/2010    Current Outpatient Medications on File Prior to Visit  Medication Sig Dispense Refill  . alendronate (FOSAMAX) 70 MG tablet Take 70 mg by mouth once a week. Take with a full glass of water on an empty stomach. (Every Tuesday)    . meloxicam (MOBIC) 7.5 MG tablet TAKE 1 TABLET BY MOUTH 2 TIMES DAILY AS NEEDED FOR PAIN. 30 tablet 2  . pravastatin (PRAVACHOL) 40 MG tablet Take 40 mg by mouth daily.    . tMarland Kitchenolamine salicylate (ASPERCREME/ALOE) 10 % cream Apply 1 application topically as needed (knee pain). 85 g 0  . diclofenac sodium (VOLTAREN) 1 % GEL Apply 2 g topically 4 (four) times daily. (Patient not taking: Reported on 04/09/2017) 1 Tube 5  . furosemide (LASIX) 20 MG tablet Take 1 tablet (20 mg total) by mouth daily. 7 tablet 0  .  metoprolol tartrate (LOPRESSOR) 25 MG tablet Take 1 tablet (25 mg total) by mouth 2 (two) times daily. 60 tablet 11  . naproxen (NAPROSYN) 500 MG tablet Take 1 tablet (500 mg total) by mouth 2 (two) times daily with a meal. (Patient not taking: Reported on 04/09/2017) 60 tablet 1  . traMADol (ULTRAM) 50 MG tablet Take 1 tablet (50 mg total) by mouth every 6 (six) hours as needed. (Patient not taking: Reported on 04/09/2017) 60 tablet 0   No current facility-administered medications on file prior to visit.     Allergies  Allergen Reactions  . Indomethacin Swelling     Objective:  BP 113/68   Pulse 60   Temp 98.6 F (37 C) (Oral)   Resp 16   Ht _0  (1.575 m)   Wt 162 lb (73.5 kg)   SpO2 97%   BMI 29.63 kg/m   Physical Exam  Constitutional: She is oriented to person, place, and time and well-developed, well-nourished, and in no distress. No distress.  Neck: Carotid bruit is not present.  Cardiovascular: Normal rate, regular rhythm and normal heart sounds.  Musculoskeletal:       Right ankle: She exhibits no swelling.       Left ankle: She exhibits no swelling.       Right lower leg: She exhibits no edema.       Left lower leg: She exhibits no edema.  Neurological: She is alert and oriented to person,  place, and time. GCS score is 15.  Skin: Skin is warm and dry.  Psychiatric: Mood, memory, affect and judgment normal.  Vitals reviewed.   Lab Results  Component Value Date   CHOL 208 (H) 03/03/2013   HDL 70 03/03/2013   LDLCALC 123 (H) 03/03/2013   TRIG 75 03/03/2013   CHOLHDL 3.0 03/03/2013    Assessment and Plan :  1. Essential hypertension - metoprolol tartrate (LOPRESSOR) 25 MG tablet; Take 1 tablet (25 mg total) by mouth 2 (two) times daily.  Dispense: 60 tablet; Refill: 11 - Controlled. Today's blood pressure is 113/68. Cont taking as directed. She denies cardiac symptoms. Routine labs are pending. She plans to see cardiology Dr. Angelena Form this fall for h/o SVT.  2.  Hyperlipidemia, unspecified hyperlipidemia type - Lipid panel - pravastatin (PRAVACHOL) 40 MG tablet; Take 1 tablet (40 mg total) by mouth daily.  Dispense: 90 tablet; Refill: 3 - Last lipid done in 2015. Will recheck.  3. Osteoarthritis of both knees, unspecified osteoarthritis type - meloxicam (MOBIC) 7.5 MG tablet; TAKE 1 TABLET BY MOUTH 2 TIMES DAILY AS NEEDED FOR PAIN.  Dispense: 30 tablet; Refill: 0  4. Screening for deficiency anemia - CBC with Differential/Platelet  5. Screening for endocrine disorder - CMP14+EGFR   Mercer Pod, PA-C  [facility] 04/09/2017 11:24 AM

## 2017-04-10 LAB — LIPID PANEL
Chol/HDL Ratio: 2.9 ratio (ref 0.0–4.4)
Cholesterol, Total: 202 mg/dL — ABNORMAL HIGH (ref 100–199)
HDL: 70 mg/dL (ref >39)
LDL Calculated: 118 mg/dL — ABNORMAL HIGH (ref 0–99)
Triglycerides: 69 mg/dL (ref 0–149)
VLDL Cholesterol Cal: 14 mg/dL (ref 5–40)

## 2017-04-10 LAB — CMP14+EGFR
ALT: 17 IU/L (ref 0–32)
AST: 23 IU/L (ref 0–40)
Albumin/Globulin Ratio: 1.3 (ref 1.2–2.2)
Albumin: 4 g/dL (ref 3.5–4.8)
Alkaline Phosphatase: 34 IU/L — ABNORMAL LOW (ref 39–117)
BUN/Creatinine Ratio: 26 (ref 12–28)
BUN: 22 mg/dL (ref 8–27)
Bilirubin Total: 0.8 mg/dL (ref 0.0–1.2)
CO2: 23 mmol/L (ref 20–29)
Calcium: 9 mg/dL (ref 8.7–10.3)
Chloride: 107 mmol/L — ABNORMAL HIGH (ref 96–106)
Creatinine, Ser: 0.84 mg/dL (ref 0.57–1.00)
GFR calc Af Amer: 81 mL/min/{1.73_m2} (ref >59)
GFR calc non Af Amer: 71 mL/min/{1.73_m2} (ref >59)
Globulin, Total: 3.2 g/dL (ref 1.5–4.5)
Glucose: 108 mg/dL — ABNORMAL HIGH (ref 65–99)
Potassium: 4.1 mmol/L (ref 3.5–5.2)
Sodium: 142 mmol/L (ref 134–144)
Total Protein: 7.2 g/dL (ref 6.0–8.5)

## 2017-04-10 LAB — CBC WITH DIFFERENTIAL/PLATELET
Basophils Absolute: 0 10*3/uL (ref 0.0–0.2)
Basos: 0 %
EOS (ABSOLUTE): 0.2 10*3/uL (ref 0.0–0.4)
Eos: 3 %
Hematocrit: 41.5 % (ref 34.0–46.6)
Hemoglobin: 13.7 g/dL (ref 11.1–15.9)
Immature Grans (Abs): 0 10*3/uL (ref 0.0–0.1)
Immature Granulocytes: 0 %
Lymphocytes Absolute: 1.6 10*3/uL (ref 0.7–3.1)
Lymphs: 32 %
MCH: 29 pg (ref 26.6–33.0)
MCHC: 33 g/dL (ref 31.5–35.7)
MCV: 88 fL (ref 79–97)
Monocytes Absolute: 0.4 10*3/uL (ref 0.1–0.9)
Monocytes: 7 %
Neutrophils Absolute: 2.8 10*3/uL (ref 1.4–7.0)
Neutrophils: 58 %
Platelets: 206 10*3/uL (ref 150–379)
RBC: 4.72 x10E6/uL (ref 3.77–5.28)
RDW: 13.9 % (ref 12.3–15.4)
WBC: 4.9 10*3/uL (ref 3.4–10.8)

## 2017-04-24 ENCOUNTER — Ambulatory Visit (INDEPENDENT_AMBULATORY_CARE_PROVIDER_SITE_OTHER): Payer: Medicare Other | Admitting: Orthopaedic Surgery

## 2017-04-24 ENCOUNTER — Encounter (INDEPENDENT_AMBULATORY_CARE_PROVIDER_SITE_OTHER): Payer: Self-pay | Admitting: Orthopaedic Surgery

## 2017-04-24 DIAGNOSIS — M1711 Unilateral primary osteoarthritis, right knee: Secondary | ICD-10-CM

## 2017-04-24 MED ORDER — TRAMADOL HCL 50 MG PO TABS: 50.0000 mg | ORAL_TABLET | Freq: Three times a day (TID) | ORAL | 2 refills | Status: DC | PRN

## 2017-04-24 NOTE — Progress Notes (Signed)
Office Visit Note   Patient: Ariel Harris           Date of Birth: [birth year]           MRN: [id number] Visit Date: 04/24/2017              Requested by: Antonietta Jewel, MD [address] [place], [place] [address] PCP: Antonietta Jewel, MD   Assessment & Plan: Visit Diagnoses:  1. Unilateral primary osteoarthritis, right knee     Plan: Impression is primary localized osteoarthritis bilateral knees right greater than left.  Patient is going to [place] within the next few weeks but would like to return in about a month for a repeat cortisone injection.  We have discussed that we need 6 weeks between cortisone injection and surgical intervention to minimize the risk of infection.  She agrees and understands.  We have thoroughly discussed definitive treatment of a total knee replacement.  She will let us know when she is ready to proceed. Total face to face encounter time was greater than 25 minutes and over half of this time was spent in counseling and/or coordination of care.  Follow-Up Instructions: Return in about 1 month (around 05/22/2017).   Orders:  No orders of the defined types were placed in this encounter.  Meds ordered this encounter  Medications  . traMADol (ULTRAM) 50 MG tablet    Sig: Take 1-2 tablets (50-100 mg total) by mouth 3 (three) times daily as needed.    Dispense:  30 tablet    Refill:  2      Procedures: No procedures performed   Clinical Data: No additional findings.   Subjective: Chief Complaint  Patient presents with  . Left Knee - Pain  . Right Knee - Pain    HPI patient comes in today with recurrent bilateral knee pain right greater than left.  She is with a [place] interpreter as she does not speak [place].  She has a long-standing history of bilateral knee primary localized osteoarthritis for which she has had cortisone injections for in the past.  Her last injections were on 02/24/2017 which have recently worn off.  She comes in today  requesting repeat injections.  She is also utilizing meloxicam and topical anti-inflammatories without relief of symptoms.  The pain she has is to the entire aspect of both knees.  It is becoming more more constant in nature.  No mechanical symptoms.  Pain is worse with increased activity.  Review of Systems as detailed in HPI.  All others are negative.   Objective: Vital Signs: There were no vitals taken for this visit.  Physical Exam well-developed well-nourished female no acute distress.  Alert and oriented x3.  Ortho Exam examination of both knees reveals varus alignment.  Range of motion 0-115 degrees.  Marked patellofemoral crepitus.  Medial joint line tenderness both sides.  Stable valgus varus stress.  She is rest intact distally.  Specialty Comments:  No specialty comments available.  Imaging: Previous imaging reveals marked tract or mental osteoarthritis   PMFS History: Patient Active Problem List   Diagnosis Date Noted  . Unilateral primary osteoarthritis, right knee 11/01/2016  . History of PSVT 01/13/2014  . Chest pain 03/28/2011  . Palpitations 03/12/2011  . Dyslipidemia 01/12/2010  . HYPERTENSION 01/12/2010  . CHEST PAIN, ATYPICAL 01/12/2010   Past Medical History:  Diagnosis Date  . Chest pain   . Hyperlipidemia   . Hypertension   . PAC (premature atrial contraction)   .  SVT (supraventricular tachycardia) ([facility])     History reviewed. No pertinent family history.  Past Surgical History:  Procedure Laterality Date  . None     Social History   Occupational History  . Not on file  Tobacco Use  . Smoking status: Never Smoker  . Smokeless tobacco: Never Used  Substance and Sexual Activity  . Alcohol use: No  . Drug use: No  . Sexual activity: Not on file

## 2017-05-01 ENCOUNTER — Ambulatory Visit (INDEPENDENT_AMBULATORY_CARE_PROVIDER_SITE_OTHER): Payer: Medicare Other | Admitting: Orthopaedic Surgery

## 2017-05-01 DIAGNOSIS — M1711 Unilateral primary osteoarthritis, right knee: Secondary | ICD-10-CM | POA: Diagnosis not present

## 2017-05-01 NOTE — Progress Notes (Signed)
Office Visit Note   Patient: Ariel Harris           Date of Birth: 12-03-1945           MRN: [id number] Visit Date: 05/01/2017              Requested by: Antonietta Jewel, MD [address] [place], [place] [address] PCP: Antonietta Jewel, MD   Assessment & Plan: Visit Diagnoses:  1. Unilateral primary osteoarthritis, right knee     Plan: Impression is advanced tricompartmental degenerative joint disease right knee.  Patient wishes to pursue total knee replacement after discussion of risks and benefits.  She understands postoperative rehab and recovery.  Patient denies history of DVT or allergy to nickel.  She has had episodes of SVT which have been worked up by Dr. Angelena Form last year.  Her EKG has been normal and she has been asymptomatic.  She takes a beta-blocker.  She has had no recurrent episodes of SVT.  We will schedule surgery in the near future  Follow-Up Instructions: Return for 2 week postop visit.   Orders:  No orders of the defined types were placed in this encounter.  No orders of the defined types were placed in this encounter.     Procedures: No procedures performed   Clinical Data: No additional findings.   Subjective: No chief complaint on file.   Patient comes in today for follow-up of right knee degenerative joint disease.  She has changed her mind and wishes to have a total knee replacement prior to going back to [place].  She has no quality of life and has significant difficulty with ADLs and chronic night pain.  She denies a history of DVT.   Review of Systems  Constitutional: Negative.   HENT: Negative.   Eyes: Negative.   Respiratory: Negative.   Cardiovascular: Negative.   Endocrine: Negative.   Musculoskeletal: Negative.   Neurological: Negative.   Hematological: Negative.   Psychiatric/Behavioral: Negative.   All other systems reviewed and are negative.    Objective: Vital Signs: There were no vitals taken for this  visit.  Physical Exam  Constitutional: She is oriented to person, place, and time. She appears well-developed and well-nourished.  Pulmonary/Chest: Effort normal.  Neurological: She is alert and oriented to person, place, and time.  Skin: Skin is warm. Capillary refill takes less than 2 seconds.  Psychiatric: She has a normal mood and affect. Her behavior is normal. Judgment and thought content normal.  Nursing note and vitals reviewed.   Ortho Exam Right knee exam is stable.  No joint effusion. Specialty Comments:  No specialty comments available.  Imaging: No results found.   PMFS History: Patient Active Problem List   Diagnosis Date Noted  . Unilateral primary osteoarthritis, right knee 11/01/2016  . History of PSVT 01/13/2014  . Chest pain 03/28/2011  . Palpitations 03/12/2011  . Dyslipidemia 01/12/2010  . HYPERTENSION 01/12/2010  . CHEST PAIN, ATYPICAL 01/12/2010   Past Medical History:  Diagnosis Date  . Chest pain   . Hyperlipidemia   . Hypertension   . PAC (premature atrial contraction)   . SVT (supraventricular tachycardia) ([facility])     No family history on file.  Past Surgical History:  Procedure Laterality Date  . None     Social History   Occupational History  . Not on file  Tobacco Use  . Smoking status: Never Smoker  . Smokeless tobacco: Never Used  Substance and Sexual Activity  .  Alcohol use: No  . Drug use: No  . Sexual activity: Not on file

## 2017-05-05 NOTE — Pre-Procedure Instructions (Signed)
Ariel Harris  05/05/2017      [id number] [address], [place] [address] Phone: [phone] Fax: [phone] [place], [place] [address] [place] [address] Phone: [phone] Fax: [phone]    Your procedure is scheduled on Friday 05/16/2017.  Report to [facility] Admitting at [place].M.  Call this number if you have problems the morning of surgery:  [phone]   Remember:  Do not eat food or drink liquids after midnight.   Continue all medications as directed by your physician except follow these medication instructions before surgery below   Take these medicines the morning of surgery with A SIP OF WATER: Metoprolol tartrate (Lopressor) Tramadol (Ultram) - if needed  7 days prior to surgery STOP taking any Meloxicam (Mobic), Aspirin (unless otherwise instructed by your surgeon), Aleve, Naproxen, Ibuprofen, Motrin, Advil, Goody's, BC's, all herbal medications, fish oil, and all vitamins    Do not wear jewelry, make-up or nail polish.  Do not wear lotions, powders, or perfumes, or deodorant.  Do not shave 48 hours prior to surgery.    Do not bring valuables to the hospital.  [facility] is not responsible for any belongings or valuables.  Hearing aids, eyeglasses, contacts, dentures or bridgework may not be worn into surgery.  Leave your suitcase in the car.  After surgery it may be brought to your room.  For patients admitted to the hospital, discharge time will be determined by your treatment team.  Patients discharged the day of surgery will not be allowed to drive home.   Name and phone number of your driver:    Special instructions:   [facility]- Preparing For Surgery  Before surgery, you can play an important role. Because skin is not sterile, your skin needs to be as free of germs as possible. You can  reduce the number of germs on your skin by washing with CHG (chlorahexidine gluconate) Soap before surgery.  CHG is an antiseptic cleaner which kills germs and bonds with the skin to continue killing germs even after washing.  Please do not use if you have an allergy to CHG or antibacterial soaps. If your skin becomes reddened/irritated stop using the CHG.  Do not shave (including legs and underarms) for at least 48 hours prior to first CHG shower. It is OK to shave your face.  Please follow these instructions carefully.   1. Shower the NIGHT BEFORE SURGERY and the MORNING OF SURGERY with CHG.   2. If you chose to wash your hair, wash your hair first as usual with your normal shampoo.  3. After you shampoo, rinse your hair and body thoroughly to remove the shampoo.  4. Use CHG as you would any other liquid soap. You can apply CHG directly to the skin and wash gently with a scrungie or a clean washcloth.   5. Apply the CHG Soap to your body ONLY FROM THE NECK DOWN.  Do not use on open wounds or open sores. Avoid contact with your eyes, ears, mouth and genitals (private parts). Wash Face and genitals (private parts)  with your normal soap.  6. Wash thoroughly, paying special attention to the area where your surgery will be performed.  7. Thoroughly rinse your body with warm water from the neck down.  8. DO NOT shower/wash with your normal  soap after using and rinsing off the CHG Soap.  9. Pat yourself dry with a CLEAN TOWEL.  10. Wear CLEAN PAJAMAS to bed the night before surgery, wear comfortable clothes the morning of surgery  11. Place CLEAN SHEETS on your bed the night of your first shower and DO NOT SLEEP WITH PETS.    Day of Surgery: Shower as stated above. Do not apply any deodorants/lotions. Please wear clean clothes to the hospital/surgery center.      Please read over the following fact sheets that you were given.

## 2017-05-06 ENCOUNTER — Encounter (HOSPITAL_COMMUNITY): Payer: Self-pay

## 2017-05-06 ENCOUNTER — Encounter (HOSPITAL_COMMUNITY)
Admission: RE | Admit: 2017-05-06 | Discharge: 2017-05-06 | Disposition: A | Discharge status: Home / Self Care | Payer: Medicare Other | Source: Ambulatory Visit | Attending: Orthopaedic Surgery | Admitting: Orthopaedic Surgery

## 2017-05-06 ENCOUNTER — Other Ambulatory Visit: Payer: Self-pay

## 2017-05-06 DIAGNOSIS — M1711 Unilateral primary osteoarthritis, right knee: Secondary | ICD-10-CM | POA: Diagnosis not present

## 2017-05-06 DIAGNOSIS — Z01818 Encounter for other preprocedural examination: Secondary | ICD-10-CM | POA: Insufficient documentation

## 2017-05-06 LAB — BASIC METABOLIC PANEL
Anion gap: 8 (ref 5–15)
BUN: 15 mg/dL (ref 6–20)
CALCIUM: 9.4 mg/dL (ref 8.9–10.3)
CHLORIDE: 108 mmol/L (ref 101–111)
CO2: 24 mmol/L (ref 22–32)
CREATININE: 0.83 mg/dL (ref 0.44–1.00)
GFR calc non Af Amer: >60 mL/min (ref >60)
Glucose, Bld: 109 mg/dL — ABNORMAL HIGH (ref 65–99)
Potassium: 4.2 mmol/L (ref 3.5–5.1)
SODIUM: 140 mmol/L (ref 135–145)

## 2017-05-06 LAB — CBC
HCT: 44.7 % (ref 36.0–46.0)
Hemoglobin: 14.6 g/dL (ref 12.0–15.0)
MCH: 29.1 pg (ref 26.0–34.0)
MCHC: 32.7 g/dL (ref 30.0–36.0)
MCV: 89.2 fL (ref 78.0–100.0)
Platelets: 183 10*3/uL (ref 150–400)
RBC: 5.01 MIL/uL (ref 3.87–5.11)
RDW: 13 % (ref 11.5–15.5)
WBC: 5.3 10*3/uL (ref 4.0–10.5)

## 2017-05-06 LAB — SURGICAL PCR SCREEN
MRSA, PCR: NEGATIVE
STAPHYLOCOCCUS AUREUS: NEGATIVE

## 2017-05-06 NOTE — Progress Notes (Signed)
Spoke with Ariel Harris who stated she has requested a [place] interpretor for [employer].  Patient's grandaughter was with patient today and speaks english. (PAT appointment)

## 2017-05-15 NOTE — Anesthesia Preprocedure Evaluation (Addendum)
Anesthesia Evaluation  Patient identified by MRN, date of birth, ID band Patient awake    Reviewed: Allergy & Precautions, NPO status , Patient's Chart, lab work & pertinent test results, reviewed documented beta blocker date and time   History of Anesthesia Complications Negative for: history of anesthetic complications  Airway Mallampati: II  TM Distance: >3 FB Neck ROM: Full    Dental  (+) Edentulous Lower, Edentulous Upper   Pulmonary neg pulmonary ROS,    breath sounds clear to auscultation       Cardiovascular hypertension, Pt. on medications and Pt. on home beta blockers (-) angina+ dysrhythmias Supra Ventricular Tachycardia  Rhythm:Regular Rate:Normal  '15 Stress test: no ischemia, normal perfusion   Neuro/Psych negative neurological ROS     GI/Hepatic negative GI ROS, Neg liver ROS,   Endo/Other  negative endocrine ROS  Renal/GU negative Renal ROS     Musculoskeletal  (+) Arthritis , Osteoarthritis,    Abdominal   Peds  Hematology negative hematology ROS (+)   Anesthesia Other Findings   Reproductive/Obstetrics                            Anesthesia Physical Anesthesia Plan  ASA: II  Anesthesia Plan: Spinal   Post-op Pain Management:    Induction:   PONV Risk Score and Plan: 2 and Ondansetron and Dexamethasone  Airway Management Planned: Natural Airway and Nasal Cannula  Additional Equipment:   Intra-op Plan:   Post-operative Plan:   Informed Consent: I have reviewed the patients History and Physical, chart, labs and discussed the procedure including the risks, benefits and alternatives for the proposed anesthesia with the patient or authorized representative who has indicated his/her understanding and acceptance.   Dental advisory given  Plan Discussed with: CRNA and Surgeon  Anesthesia Plan Comments: (Plan routine monitors, SAB with adductor block for post op  analgesia)        Anesthesia Quick Evaluation

## 2017-05-16 ENCOUNTER — Encounter (HOSPITAL_COMMUNITY)
Admission: RE | Disposition: A | Discharge status: Skilled Nursing Facility | Payer: Self-pay | Source: Ambulatory Visit | Attending: Orthopaedic Surgery

## 2017-05-16 ENCOUNTER — Other Ambulatory Visit: Payer: Self-pay

## 2017-05-16 ENCOUNTER — Inpatient Hospital Stay (HOSPITAL_COMMUNITY)
Admission: RE | Admit: 2017-05-16 | Discharge: 2017-05-19 | DRG: 470 | Disposition: A | Discharge status: Skilled Nursing Facility | Payer: Medicare Other | Source: Ambulatory Visit | Attending: Orthopaedic Surgery | Admitting: Orthopaedic Surgery

## 2017-05-16 ENCOUNTER — Inpatient Hospital Stay (HOSPITAL_COMMUNITY): Payer: Medicare Other

## 2017-05-16 ENCOUNTER — Ambulatory Visit (HOSPITAL_COMMUNITY): Payer: Medicare Other | Admitting: Anesthesiology

## 2017-05-16 ENCOUNTER — Encounter (HOSPITAL_COMMUNITY): Payer: Self-pay | Admitting: General Practice

## 2017-05-16 ENCOUNTER — Ambulatory Visit (HOSPITAL_COMMUNITY): Admission: RE | Admit: 2017-05-16 | Payer: Medicare Other | Source: Ambulatory Visit | Admitting: Orthopaedic Surgery

## 2017-05-16 ENCOUNTER — Encounter (HOSPITAL_COMMUNITY): Admission: RE | Payer: Self-pay | Source: Ambulatory Visit

## 2017-05-16 DIAGNOSIS — G8918 Other acute postprocedural pain: Secondary | ICD-10-CM | POA: Diagnosis not present

## 2017-05-16 DIAGNOSIS — R488 Other symbolic dysfunctions: Secondary | ICD-10-CM | POA: Diagnosis not present

## 2017-05-16 DIAGNOSIS — Z96651 Presence of right artificial knee joint: Secondary | ICD-10-CM

## 2017-05-16 DIAGNOSIS — M1711 Unilateral primary osteoarthritis, right knee: Secondary | ICD-10-CM | POA: Diagnosis not present

## 2017-05-16 DIAGNOSIS — Z888 Allergy status to other drugs, medicaments and biological substances status: Secondary | ICD-10-CM

## 2017-05-16 DIAGNOSIS — Z6829 Body mass index (BMI) 29.0-29.9, adult: Secondary | ICD-10-CM

## 2017-05-16 DIAGNOSIS — Z471 Aftercare following joint replacement surgery: Secondary | ICD-10-CM | POA: Diagnosis not present

## 2017-05-16 DIAGNOSIS — R262 Difficulty in walking, not elsewhere classified: Secondary | ICD-10-CM | POA: Diagnosis not present

## 2017-05-16 DIAGNOSIS — M6281 Muscle weakness (generalized): Secondary | ICD-10-CM | POA: Diagnosis not present

## 2017-05-16 DIAGNOSIS — Z79899 Other long term (current) drug therapy: Secondary | ICD-10-CM

## 2017-05-16 DIAGNOSIS — G8911 Acute pain due to trauma: Secondary | ICD-10-CM | POA: Diagnosis not present

## 2017-05-16 DIAGNOSIS — I471 Supraventricular tachycardia: Secondary | ICD-10-CM | POA: Diagnosis not present

## 2017-05-16 DIAGNOSIS — E785 Hyperlipidemia, unspecified: Secondary | ICD-10-CM | POA: Diagnosis present

## 2017-05-16 DIAGNOSIS — E669 Obesity, unspecified: Secondary | ICD-10-CM | POA: Diagnosis present

## 2017-05-16 DIAGNOSIS — I10 Essential (primary) hypertension: Secondary | ICD-10-CM | POA: Diagnosis not present

## 2017-05-16 DIAGNOSIS — R2681 Unsteadiness on feet: Secondary | ICD-10-CM | POA: Diagnosis not present

## 2017-05-16 DIAGNOSIS — Z7983 Long term (current) use of bisphosphonates: Secondary | ICD-10-CM | POA: Diagnosis not present

## 2017-05-16 DIAGNOSIS — S8990XA Unspecified injury of unspecified lower leg, initial encounter: Secondary | ICD-10-CM | POA: Diagnosis not present

## 2017-05-16 DIAGNOSIS — Z96659 Presence of unspecified artificial knee joint: Secondary | ICD-10-CM

## 2017-05-16 HISTORY — DX: Unspecified osteoarthritis, unspecified site: M19.90

## 2017-05-16 HISTORY — PX: TOTAL KNEE ARTHROPLASTY: SHX125

## 2017-05-16 LAB — DIFFERENTIAL
BASOS ABS: 0 10*3/uL (ref 0.0–0.1)
BASOS PCT: 0 %
EOS ABS: 0.3 10*3/uL (ref 0.0–0.7)
EOS PCT: 6 %
LYMPHS ABS: 1.7 10*3/uL (ref 0.7–4.0)
Lymphocytes Relative: 38 %
Monocytes Absolute: 0.4 10*3/uL (ref 0.1–1.0)
Monocytes Relative: 8 %
NEUTROS PCT: 48 %
Neutro Abs: 2.2 10*3/uL (ref 1.7–7.7)

## 2017-05-16 LAB — APTT: APTT: 32 s (ref 24–36)

## 2017-05-16 LAB — TYPE AND SCREEN
ABO/RH(D): A POS
ANTIBODY SCREEN: NEGATIVE

## 2017-05-16 LAB — PROTIME-INR
INR: 1.06
PROTHROMBIN TIME: 13.7 s (ref 11.4–15.2)

## 2017-05-16 LAB — ABO/RH: ABO/RH(D): A POS

## 2017-05-16 SURGERY — ARTHROPLASTY, KNEE, TOTAL
Anesthesia: Spinal | Site: Knee | Laterality: Right

## 2017-05-16 SURGERY — ARTHROPLASTY, KNEE, TOTAL
Anesthesia: Spinal | Laterality: Right

## 2017-05-16 MED ORDER — TRANEXAMIC ACID 1000 MG/10ML IV SOLN
1000.0000 mg | Freq: Once | INTRAVENOUS | Status: AC
  Administered 2017-05-16: 1000 mg via INTRAVENOUS
  Filled 2017-05-16: qty 10

## 2017-05-16 MED ORDER — ALUM & MAG HYDROXIDE-SIMETH 200-200-20 MG/5ML PO SUSP: 30.0000 mL | ORAL | Status: DC | PRN

## 2017-05-16 MED ORDER — CEFAZOLIN SODIUM-DEXTROSE 2-3 GM-%(50ML) IV SOLR
INTRAVENOUS | Status: DC | PRN
  Administered 2017-05-16: 2 g via INTRAVENOUS

## 2017-05-16 MED ORDER — BUPIVACAINE LIPOSOME 1.3 % IJ SUSP
20.0000 mL | INTRAMUSCULAR | Status: DC
  Filled 2017-05-16: qty 20

## 2017-05-16 MED ORDER — GABAPENTIN 300 MG PO CAPS
300.0000 mg | ORAL_CAPSULE | Freq: Three times a day (TID) | ORAL | Status: DC
Start: 2017-05-16 — End: 2017-05-19
  Administered 2017-05-16 – 2017-05-19 (×10): 300 mg via ORAL
  Filled 2017-05-16 (×9): qty 1

## 2017-05-16 MED ORDER — PROMETHAZINE HCL 25 MG/ML IJ SOLN: 6.2500 mg | INTRAMUSCULAR | Status: DC | PRN

## 2017-05-16 MED ORDER — DOCUSATE SODIUM 100 MG PO CAPS
100.0000 mg | ORAL_CAPSULE | Freq: Two times a day (BID) | ORAL | Status: DC
  Administered 2017-05-16 – 2017-05-19 (×4): 100 mg via ORAL
  Filled 2017-05-16 (×6): qty 1

## 2017-05-16 MED ORDER — POLYETHYLENE GLYCOL 3350 17 G PO PACK: 17.0000 g | PACK | Freq: Every day | ORAL | Status: DC | PRN

## 2017-05-16 MED ORDER — TRANEXAMIC ACID 1000 MG/10ML IV SOLN
INTRAVENOUS | Status: DC | PRN
  Administered 2017-05-16: 2000 mg via TOPICAL

## 2017-05-16 MED ORDER — METHOCARBAMOL 500 MG PO TABS
500.0000 mg | ORAL_TABLET | Freq: Four times a day (QID) | ORAL | Status: DC | PRN
  Administered 2017-05-18 – 2017-05-19 (×2): 500 mg via ORAL
  Filled 2017-05-16 (×2): qty 1

## 2017-05-16 MED ORDER — OXYCODONE HCL ER 10 MG PO T12A: 10.0000 mg | EXTENDED_RELEASE_TABLET | Freq: Two times a day (BID) | ORAL | 0 refills | Status: AC

## 2017-05-16 MED ORDER — PROMETHAZINE HCL 25 MG PO TABS: 25.0000 mg | ORAL_TABLET | Freq: Four times a day (QID) | ORAL | 1 refills | Status: DC | PRN

## 2017-05-16 MED ORDER — MEPERIDINE HCL 50 MG/ML IJ SOLN: 6.2500 mg | INTRAMUSCULAR | Status: DC | PRN

## 2017-05-16 MED ORDER — ONDANSETRON HCL 4 MG PO TABS
4.0000 mg | ORAL_TABLET | Freq: Four times a day (QID) | ORAL | Status: DC | PRN
  Administered 2017-05-18: 4 mg via ORAL
  Filled 2017-05-16: qty 1

## 2017-05-16 MED ORDER — PRAVASTATIN SODIUM 40 MG PO TABS
40.0000 mg | ORAL_TABLET | Freq: Every day | ORAL | Status: DC
  Administered 2017-05-16 – 2017-05-19 (×4): 40 mg via ORAL
  Filled 2017-05-16 (×4): qty 1

## 2017-05-16 MED ORDER — MENTHOL 3 MG MT LOZG: 1.0000 | LOZENGE | OROMUCOSAL | Status: DC | PRN

## 2017-05-16 MED ORDER — LACTATED RINGERS IV SOLN
INTRAVENOUS | Status: DC | PRN
  Administered 2017-05-16 (×2): via INTRAVENOUS

## 2017-05-16 MED ORDER — BUPIVACAINE IN DEXTROSE 0.75-8.25 % IT SOLN
INTRATHECAL | Status: DC | PRN
  Administered 2017-05-16: 12 mg via INTRATHECAL

## 2017-05-16 MED ORDER — METHOCARBAMOL 1000 MG/10ML IJ SOLN
500.0000 mg | Freq: Four times a day (QID) | INTRAVENOUS | Status: DC | PRN
  Filled 2017-05-16: qty 5

## 2017-05-16 MED ORDER — MAGNESIUM CITRATE PO SOLN: 1.0000 | Freq: Once | ORAL | Status: DC | PRN

## 2017-05-16 MED ORDER — BUPIVACAINE LIPOSOME 1.3 % IJ SUSP
INTRAMUSCULAR | Status: DC | PRN
  Administered 2017-05-16: 20 mL

## 2017-05-16 MED ORDER — DEXAMETHASONE SODIUM PHOSPHATE 10 MG/ML IJ SOLN
INTRAMUSCULAR | Status: DC | PRN
  Administered 2017-05-16: 5 mg via INTRAVENOUS

## 2017-05-16 MED ORDER — CELECOXIB 200 MG PO CAPS
200.0000 mg | ORAL_CAPSULE | Freq: Two times a day (BID) | ORAL | Status: DC
  Administered 2017-05-16 – 2017-05-17 (×3): 200 mg via ORAL
  Filled 2017-05-16 (×6): qty 1

## 2017-05-16 MED ORDER — FENTANYL CITRATE (PF) 100 MCG/2ML IJ SOLN
INTRAMUSCULAR | Status: DC | PRN
  Administered 2017-05-16: 50 ug via INTRAVENOUS

## 2017-05-16 MED ORDER — OXYCODONE HCL 5 MG PO TABS: 5.0000 mg | ORAL_TABLET | ORAL | 0 refills | Status: DC | PRN

## 2017-05-16 MED ORDER — PROPOFOL 500 MG/50ML IV EMUL
INTRAVENOUS | Status: AC
  Filled 2017-05-16: qty 100

## 2017-05-16 MED ORDER — DEXAMETHASONE SODIUM PHOSPHATE 10 MG/ML IJ SOLN
10.0000 mg | Freq: Once | INTRAMUSCULAR | Status: AC
  Administered 2017-05-17: 10 mg via INTRAVENOUS
  Filled 2017-05-16: qty 1

## 2017-05-16 MED ORDER — DIPHENHYDRAMINE HCL 12.5 MG/5ML PO ELIX
25.0000 mg | ORAL_SOLUTION | ORAL | Status: DC | PRN
  Filled 2017-05-16: qty 10

## 2017-05-16 MED ORDER — HYDROMORPHONE HCL 2 MG/ML IJ SOLN: 0.5000 mg | INTRAMUSCULAR | Status: DC | PRN

## 2017-05-16 MED ORDER — PROPOFOL 10 MG/ML IV BOLUS
INTRAVENOUS | Status: DC | PRN
  Administered 2017-05-16: 25 mg via INTRAVENOUS

## 2017-05-16 MED ORDER — VANCOMYCIN HCL 1000 MG IV SOLR
INTRAVENOUS | Status: AC
  Filled 2017-05-16: qty 1000

## 2017-05-16 MED ORDER — LIDOCAINE HCL (CARDIAC) PF 100 MG/5ML IV SOSY
PREFILLED_SYRINGE | INTRAVENOUS | Status: DC | PRN
  Administered 2017-05-16: 50 mg via INTRAVENOUS

## 2017-05-16 MED ORDER — SODIUM CHLORIDE 0.9% FLUSH
INTRAVENOUS | Status: DC | PRN
  Administered 2017-05-16: 40 mL

## 2017-05-16 MED ORDER — GLYCOPYRROLATE 0.2 MG/ML IJ SOLN
INTRAMUSCULAR | Status: DC | PRN
  Administered 2017-05-16: 0.2 mg via INTRAVENOUS

## 2017-05-16 MED ORDER — ONDANSETRON HCL 4 MG PO TABS: 4.0000 mg | ORAL_TABLET | Freq: Three times a day (TID) | ORAL | 0 refills | Status: DC | PRN

## 2017-05-16 MED ORDER — METOCLOPRAMIDE HCL 5 MG/ML IJ SOLN: 5.0000 mg | Freq: Three times a day (TID) | INTRAMUSCULAR | Status: DC | PRN

## 2017-05-16 MED ORDER — MIDAZOLAM HCL 2 MG/2ML IJ SOLN: 0.5000 mg | Freq: Once | INTRAMUSCULAR | Status: DC | PRN

## 2017-05-16 MED ORDER — ENSURE ENLIVE PO LIQD
237.0000 mL | Freq: Two times a day (BID) | ORAL | Status: DC
  Administered 2017-05-16 – 2017-05-18 (×4): 237 mL via ORAL

## 2017-05-16 MED ORDER — VANCOMYCIN HCL 1 G IV SOLR
INTRAVENOUS | Status: DC | PRN
  Administered 2017-05-16: 1000 mg via TOPICAL

## 2017-05-16 MED ORDER — HYDROMORPHONE HCL 2 MG/ML IJ SOLN: 0.2500 mg | INTRAMUSCULAR | Status: DC | PRN

## 2017-05-16 MED ORDER — PHENYLEPHRINE HCL 10 MG/ML IJ SOLN
INTRAVENOUS | Status: DC | PRN
  Administered 2017-05-16: 20 ug/min via INTRAVENOUS

## 2017-05-16 MED ORDER — 0.9 % SODIUM CHLORIDE (POUR BTL) OPTIME
TOPICAL | Status: DC | PRN
  Administered 2017-05-16: 1000 mL

## 2017-05-16 MED ORDER — OXYCODONE HCL ER 10 MG PO T12A
10.0000 mg | EXTENDED_RELEASE_TABLET | Freq: Two times a day (BID) | ORAL | Status: DC
  Administered 2017-05-16 – 2017-05-17 (×3): 10 mg via ORAL
  Filled 2017-05-16 (×4): qty 1

## 2017-05-16 MED ORDER — SODIUM CHLORIDE 0.9 % IR SOLN
Status: DC | PRN
  Administered 2017-05-16: 3000 mL

## 2017-05-16 MED ORDER — SENNOSIDES-DOCUSATE SODIUM 8.6-50 MG PO TABS: 1.0000 | ORAL_TABLET | Freq: Every evening | ORAL | 1 refills | Status: DC | PRN

## 2017-05-16 MED ORDER — ACETAMINOPHEN 325 MG PO TABS
325.0000 mg | ORAL_TABLET | Freq: Four times a day (QID) | ORAL | Status: DC | PRN
  Administered 2017-05-18 (×2): 650 mg via ORAL
  Filled 2017-05-16 (×2): qty 2

## 2017-05-16 MED ORDER — ACETAMINOPHEN 500 MG PO TABS
1000.0000 mg | ORAL_TABLET | Freq: Four times a day (QID) | ORAL | Status: AC
  Administered 2017-05-16: 1000 mg via ORAL
  Filled 2017-05-16: qty 2

## 2017-05-16 MED ORDER — SODIUM CHLORIDE 0.9 % IV SOLN
INTRAVENOUS | Status: DC
  Administered 2017-05-16: 16:00:00 via INTRAVENOUS

## 2017-05-16 MED ORDER — PROPOFOL 500 MG/50ML IV EMUL
INTRAVENOUS | Status: DC | PRN
  Administered 2017-05-16: 50 ug/kg/min via INTRAVENOUS

## 2017-05-16 MED ORDER — ASPIRIN EC 81 MG PO TBEC: 81.0000 mg | DELAYED_RELEASE_TABLET | Freq: Two times a day (BID) | ORAL | 0 refills | Status: DC

## 2017-05-16 MED ORDER — METHOCARBAMOL 750 MG PO TABS: 750.0000 mg | ORAL_TABLET | Freq: Two times a day (BID) | ORAL | 0 refills | Status: DC | PRN

## 2017-05-16 MED ORDER — ONDANSETRON HCL 4 MG/2ML IJ SOLN
4.0000 mg | Freq: Four times a day (QID) | INTRAMUSCULAR | Status: DC | PRN
  Administered 2017-05-16: 4 mg via INTRAVENOUS
  Filled 2017-05-16: qty 2

## 2017-05-16 MED ORDER — PHENOL 1.4 % MT LIQD: 1.0000 | OROMUCOSAL | Status: DC | PRN

## 2017-05-16 MED ORDER — KETOROLAC TROMETHAMINE 15 MG/ML IJ SOLN
30.0000 mg | Freq: Four times a day (QID) | INTRAMUSCULAR | Status: AC
  Administered 2017-05-16: 30 mg via INTRAVENOUS
  Filled 2017-05-16: qty 2

## 2017-05-16 MED ORDER — TRANEXAMIC ACID 1000 MG/10ML IV SOLN
1000.0000 mg | INTRAVENOUS | Status: AC
  Administered 2017-05-16: 1000 mg via INTRAVENOUS
  Filled 2017-05-16: qty 1100

## 2017-05-16 MED ORDER — OXYCODONE HCL 5 MG PO TABS: 10.0000 mg | ORAL_TABLET | ORAL | Status: DC | PRN

## 2017-05-16 MED ORDER — TRANEXAMIC ACID 1000 MG/10ML IV SOLN
2000.0000 mg | Freq: Once | INTRAVENOUS | Status: DC
  Filled 2017-05-16: qty 20

## 2017-05-16 MED ORDER — MIDAZOLAM HCL 5 MG/5ML IJ SOLN
INTRAMUSCULAR | Status: DC | PRN
  Administered 2017-05-16 (×2): 1 mg via INTRAVENOUS

## 2017-05-16 MED ORDER — SORBITOL 70 % SOLN: 30.0000 mL | Freq: Every day | Status: DC | PRN

## 2017-05-16 MED ORDER — ROPIVACAINE HCL 7.5 MG/ML IJ SOLN
INTRAMUSCULAR | Status: DC | PRN
  Administered 2017-05-16: 20 mL via PERINEURAL

## 2017-05-16 MED ORDER — METOPROLOL TARTRATE 25 MG PO TABS
25.0000 mg | ORAL_TABLET | Freq: Two times a day (BID) | ORAL | Status: DC
  Administered 2017-05-16 – 2017-05-19 (×6): 25 mg via ORAL
  Filled 2017-05-16 (×6): qty 1

## 2017-05-16 MED ORDER — ASPIRIN 81 MG PO CHEW
81.0000 mg | CHEWABLE_TABLET | Freq: Two times a day (BID) | ORAL | Status: DC
  Administered 2017-05-16 – 2017-05-19 (×6): 81 mg via ORAL
  Filled 2017-05-16 (×6): qty 1

## 2017-05-16 MED ORDER — OXYCODONE HCL 5 MG PO TABS
5.0000 mg | ORAL_TABLET | ORAL | Status: DC | PRN
  Administered 2017-05-18: 5 mg via ORAL
  Filled 2017-05-16: qty 1

## 2017-05-16 MED ORDER — CEFAZOLIN SODIUM-DEXTROSE 2-4 GM/100ML-% IV SOLN
2.0000 g | Freq: Four times a day (QID) | INTRAVENOUS | Status: AC
  Administered 2017-05-16 – 2017-05-17 (×3): 2 g via INTRAVENOUS
  Filled 2017-05-16 (×3): qty 100

## 2017-05-16 MED ORDER — METOCLOPRAMIDE HCL 5 MG PO TABS: 5.0000 mg | ORAL_TABLET | Freq: Three times a day (TID) | ORAL | Status: DC | PRN

## 2017-05-16 SURGICAL SUPPLY — 73 items
ALCOHOL ISOPROPYL (RUBBING) (MISCELLANEOUS) ×3 IMPLANT
BAG DECANTER FOR FLEXI CONT (MISCELLANEOUS) ×3 IMPLANT
BANDAGE ELASTIC 6 VELCRO ST LF (GAUZE/BANDAGES/DRESSINGS) ×3 IMPLANT
BANDAGE ESMARK 6X9 LF (GAUZE/BANDAGES/DRESSINGS) ×1 IMPLANT
BASEPLATE TIBIAL PRIMARY SZ4 (Plate) ×3 IMPLANT
BEARIN INSERT TIBIAL SZ4 9 (Orthopedic Implant) ×2 IMPLANT
BEARIN INSERT TIBIAL SZ4 9MM (Orthopedic Implant) ×1 IMPLANT
BEARING INSERT TIBIAL SZ4 9 (Orthopedic Implant) ×1 IMPLANT
BENZOIN TINCTURE PRP APPL 2/3 (GAUZE/BANDAGES/DRESSINGS) ×3 IMPLANT
BLADE SAW SAG 90X13X1.27 (BLADE) ×3 IMPLANT
BLADE SAW SGTL 13.0X1.19X90.0M (BLADE) ×3 IMPLANT
BNDG ELASTIC 6X10 VLCR STRL LF (GAUZE/BANDAGES/DRESSINGS) ×3 IMPLANT
BNDG ESMARK 6X9 LF (GAUZE/BANDAGES/DRESSINGS) ×3
BOWL SMART MIX CTS (DISPOSABLE) ×3 IMPLANT
CEMENT BONE REFOBACIN R1X40 US (Cement) ×6 IMPLANT
CLOSURE STERI-STRIP 1/2X4 (GAUZE/BANDAGES/DRESSINGS) ×1
CLSR STERI-STRIP ANTIMIC 1/2X4 (GAUZE/BANDAGES/DRESSINGS) ×2 IMPLANT
COVER SURGICAL LIGHT HANDLE (MISCELLANEOUS) ×3 IMPLANT
CUFF TOURNIQUET SINGLE 34IN LL (TOURNIQUET CUFF) ×3 IMPLANT
CUFF TOURNIQUET SINGLE 44IN (TOURNIQUET CUFF) IMPLANT
DRAPE EXTREMITY T 121X128X90 (DRAPE) ×3 IMPLANT
DRAPE HALF SHEET 40X57 (DRAPES) ×3 IMPLANT
DRAPE INCISE IOBAN 66X45 STRL (DRAPES) IMPLANT
DRAPE ORTHO SPLIT 77X108 STRL (DRAPES) ×4
DRAPE POUCH INSTRU U-SHP 10X18 (DRAPES) ×3 IMPLANT
DRAPE SURG 17X11 SM STRL (DRAPES) ×6 IMPLANT
DRAPE SURG ORHT 6 SPLT 77X108 (DRAPES) ×2 IMPLANT
DRSG AQUACEL AG ADV 3.5X14 (GAUZE/BANDAGES/DRESSINGS) ×3 IMPLANT
DURAPREP 26ML APPLICATOR (WOUND CARE) ×9 IMPLANT
ELECT CAUTERY BLADE 6.4 (BLADE) ×3 IMPLANT
ELECT REM PT RETURN 9FT ADLT (ELECTROSURGICAL) ×3
ELECTRODE REM PT RTRN 9FT ADLT (ELECTROSURGICAL) ×1 IMPLANT
FEMORAL PEG DISTAL FIXATION (Orthopedic Implant) ×3 IMPLANT
FEMORAL TRIATH POST STAB SZ 4 (Orthopedic Implant) ×3 IMPLANT
GLOVE BIOGEL PI IND STRL 7.0 (GLOVE) ×1 IMPLANT
GLOVE BIOGEL PI INDICATOR 7.0 (GLOVE) ×2
GLOVE ECLIPSE 7.0 STRL STRAW (GLOVE) ×3 IMPLANT
GLOVE SKINSENSE NS SZ7.5 (GLOVE) ×2
GLOVE SKINSENSE STRL SZ7.5 (GLOVE) ×1 IMPLANT
GLOVE SURG SYN 7.5  E (GLOVE) ×8
GLOVE SURG SYN 7.5 E (GLOVE) ×4 IMPLANT
GOWN STRL REIN XL XLG (GOWN DISPOSABLE) ×3 IMPLANT
GOWN STRL REUS W/ TWL LRG LVL3 (GOWN DISPOSABLE) ×1 IMPLANT
GOWN STRL REUS W/TWL LRG LVL3 (GOWN DISPOSABLE) ×2
HANDPIECE INTERPULSE COAX TIP (DISPOSABLE) ×2
HOOD PEEL AWAY FLYTE STAYCOOL (MISCELLANEOUS) ×6 IMPLANT
KIT BASIN OR (CUSTOM PROCEDURE TRAY) ×3 IMPLANT
KIT TURNOVER KIT B (KITS) ×3 IMPLANT
MANIFOLD NEPTUNE II (INSTRUMENTS) ×3 IMPLANT
MARKER SKIN DUAL TIP RULER LAB (MISCELLANEOUS) ×3 IMPLANT
NEEDLE SPNL 18GX3.5 QUINCKE PK (NEEDLE) ×3 IMPLANT
NS IRRIG 1000ML POUR BTL (IV SOLUTION) ×3 IMPLANT
PACK TOTAL JOINT (CUSTOM PROCEDURE TRAY) ×3 IMPLANT
PAD ARMBOARD 7.5X6 YLW CONV (MISCELLANEOUS) ×6 IMPLANT
PATELLA 32MMX10MM (Knees) ×3 IMPLANT
SAW OSC TIP CART 19.5X105X1.3 (SAW) ×3 IMPLANT
SET HNDPC FAN SPRY TIP SCT (DISPOSABLE) ×1 IMPLANT
STAPLER VISISTAT 35W (STAPLE) IMPLANT
SUCTION FRAZIER HANDLE 10FR (MISCELLANEOUS) ×2
SUCTION TUBE FRAZIER 10FR DISP (MISCELLANEOUS) ×1 IMPLANT
SUT ETHILON 2 0 FS 18 (SUTURE) IMPLANT
SUT MNCRL AB 4-0 PS2 18 (SUTURE) IMPLANT
SUT VIC AB 0 CT1 27 (SUTURE) ×4
SUT VIC AB 0 CT1 27XBRD ANBCTR (SUTURE) ×2 IMPLANT
SUT VIC AB 1 CTX 27 (SUTURE) ×9 IMPLANT
SUT VIC AB 2-0 CT1 27 (SUTURE) ×6
SUT VIC AB 2-0 CT1 TAPERPNT 27 (SUTURE) ×3 IMPLANT
SYR 50ML LL SCALE MARK (SYRINGE) ×3 IMPLANT
TOWEL OR 17X24 6PK STRL BLUE (TOWEL DISPOSABLE) ×3 IMPLANT
TOWEL OR 17X26 10 PK STRL BLUE (TOWEL DISPOSABLE) ×3 IMPLANT
TRAY CATH 16FR W/PLASTIC CATH (SET/KITS/TRAYS/PACK) ×3 IMPLANT
UNDERPAD 30X30 (UNDERPADS AND DIAPERS) ×3 IMPLANT
WRAP KNEE MAXI GEL POST OP (GAUZE/BANDAGES/DRESSINGS) ×3 IMPLANT

## 2017-05-16 NOTE — Evaluation (Signed)
Physical Therapy Evaluation Patient Details Name: Ariel Harris MRN: [id number] DOB: 02-27-1945 Today's Date: 05/16/2017   History of Present Illness  72 y.o. female s/p R TKA 05/16/17. PMH includes: SVT, PAC, HTN, L TKA 1996  Clinical Impression  Patient is s/p above surgery resulting in functional limitations due to the deficits listed below (see PT Problem List). PTA, pt reports she was living alone with 22 year old grandchild, independent with all mobility and driving. Upon eval, pt presents with post op pain and weakness. Currently min A for bed mobility and transfers, min guard for ambulating in hallway short distances. Pt presents as fall risk at this time, no family present to discuss home set up or level of assistance she will have, unclear at this time due to patients limited [place]. Next PT visit will utilize translator. Given current presentation feel patient wuold benefit from short term SNF, however with progression with PT and confirming family support/ability to assist home could be an attainable goal. Will re-assess next visit.  Patient will benefit from skilled PT to increase their independence and safety with mobility to allow discharge to the venue listed below.       Follow Up Recommendations Follow surgeon's recommendation for DC plan and follow-up therapies;Supervision/Assistance - 24 hour;SNF    Equipment Recommendations  (TBD at next venue)    Recommendations for Other Services OT consult     Precautions / Restrictions Precautions Precautions: Fall;Knee Precaution Booklet Issued: Yes (comment) Precaution Comments: reveiwed no pillow under knee, supine therex Restrictions Other Position/Activity Restrictions: WBAT      Mobility  Bed Mobility Overal bed mobility: Needs Assistance Bed Mobility: Sit to Supine;Supine to Sit     Supine to sit: Min assist Sit to supine: Min assist   General bed mobility comments: min A to assist surgical leg over EOB, power up  trunk with hand held assist  Transfers Overall transfer level: Needs assistance Equipment used: Rolling walker (2 wheeled) Transfers: Sit to/from Stand Sit to Stand: Min assist         General transfer comment: Min A to help power up , cues for hand placement.  Ambulation/Gait Ambulation/Gait assistance: Min guard Ambulation Distance (Feet): 60 Feet Assistive device: Rolling walker (2 wheeled) Gait Pattern/deviations: Step-to pattern Gait velocity: decreased   General Gait Details: Patient with slow and painful gait, short steps. min guard for safety.   Stairs            Wheelchair Mobility    Modified Rankin (Stroke Patients Only)       Balance Overall balance assessment: Needs assistance Sitting-balance support: Feet unsupported;No upper extremity supported Sitting balance-Leahy Scale: Good       Standing balance-Leahy Scale: Fair                               Pertinent Vitals/Pain Pain Assessment: Faces Faces Pain Scale: Hurts a little bit Pain Location: R Knee Pain Descriptors / Indicators: Discomfort Pain Intervention(s): Limited activity within patient's tolerance;Monitored during session    Home Living Family/patient expects to be discharged to:: Private residence Living Arrangements: (boyfriend and her daugther ) Available Help at Discharge: Available PRN/intermittently Type of Home: House Home Access: Stairs to enter Entrance Stairs-Rails: Can reach both Entrance Stairs-Number of Steps: 5 Home Layout: One level Home Equipment: None      Prior Function Level of Independence: Independent(Drives)  Hand Dominance   Dominant Hand: Right    Extremity/Trunk Assessment   Upper Extremity Assessment Upper Extremity Assessment: Overall WFL for tasks assessed    Lower Extremity Assessment Lower Extremity Assessment: (RLE 4/5, LLE 3/5)    Cervical / Trunk Assessment Cervical / Trunk Assessment: Normal   Communication   Communication: Prefers language other than English([place] )  Cognition Arousal/Alertness: Awake/alert Behavior During Therapy: WFL for tasks assessed/performed Overall Cognitive Status: Within Functional Limits for tasks assessed                                        General Comments      Exercises Total Joint Exercises Ankle Circles/Pumps: 20 reps Quad Sets: 10 reps   Assessment/Plan    PT Assessment Patient needs continued PT services  PT Problem List Decreased strength;Decreased range of motion;Decreased activity tolerance;Decreased balance;Decreased mobility;Pain       PT Treatment Interventions DME instruction;Gait training;Stair training;Functional mobility training;Therapeutic activities;Therapeutic exercise;Balance training    PT Goals (Current goals can be found in the Care Plan section)  Acute Rehab PT Goals Patient Stated Goal: go home PT Goal Formulation: With patient Potential to Achieve Goals: Fair    Frequency 7X/week   Barriers to discharge Decreased caregiver support(pt lives alone with 44 year old granddaughter )      Co-evaluation               AM-PAC PT "6 Clicks" Daily Activity  Outcome Measure Difficulty turning over in bed (including adjusting bedclothes, sheets and blankets)?: Unable Difficulty moving from lying on back to sitting on the side of the bed? : Unable Difficulty sitting down on and standing up from a chair with arms (e.g., wheelchair, bedside commode, etc,.)?: Unable Help needed moving to and from a bed to chair (including a wheelchair)?: A Little Help needed walking in hospital room?: A Little Help needed climbing 3-5 steps with a railing? : A Lot 6 Click Score: 11    End of Session Equipment Utilized During Treatment: Gait belt Activity Tolerance: Patient tolerated treatment well Patient left: in bed;with call bell/phone within reach Nurse Communication: Mobility status PT Visit  Diagnosis: Unsteadiness on feet (R26.81);Other abnormalities of gait and mobility (R26.89);Pain Pain - Right/Left: Left Pain - part of body: Knee    Time: 1750-1820 PT Time Calculation (min) (ACUTE ONLY): 30 min   Charges:   PT Evaluation $PT Eval Low Complexity: 1 Low PT Treatments $Gait Training: 8-22 mins   PT G Codes:        Reinaldo Berber, PT, DPT Acute Rehab Services Pager: [phone]    Reinaldo Berber 05/16/2017, 6:12 PM

## 2017-05-16 NOTE — Anesthesia Procedure Notes (Signed)
Spinal  Patient location during procedure: OR End time: 05/16/2017 7:39 AM Staffing Anesthesiologist: Annye Asa, MD Performed: anesthesiologist  Preanesthetic Checklist Completed: patient identified, site marked, surgical consent, pre-op evaluation, timeout performed, IV checked, risks and benefits discussed and monitors and equipment checked Spinal Block Patient position: sitting Prep: site prepped and draped and DuraPrep Patient monitoring: blood pressure, continuous pulse ox, cardiac monitor and heart rate Approach: midline Location: L3-4 Injection technique: single-shot Needle Needle type: Pencan  Needle gauge: 24 G Needle length: 9 cm Additional Notes Pt identified in Operating room.  Monitors applied. Working IV access confirmed. Sterile prep, drape lumbar spine.  1% lido local L 3,4.  #24ga Pencan into clear CSF L 3,4.  12mg  0.75% Bupivacaine with dextrose injected with asp CSF beginning and end of injection.  Patient asymptomatic, VSS, no heme aspirated, tolerated well.  Jenita Seashore, MD

## 2017-05-16 NOTE — Progress Notes (Signed)
Orthopedic Tech Progress Note Patient Details:  Ariel Harris 10/06/1945 [id number]  CPM Right Knee CPM Right Knee: On Right Knee Flexion (Degrees): 90 Right Knee Extension (Degrees): 0  Post Interventions Patient Tolerated: Well Instructions Provided: Care of device  Hildred Priest 05/16/2017, 10:46 AM

## 2017-05-16 NOTE — Anesthesia Procedure Notes (Signed)
Anesthesia Regional Block: Adductor canal block   Pre-Anesthetic Checklist: ,, timeout performed, Correct Patient, Correct Site, Correct Laterality, Correct Procedure, Correct Position, site marked, Risks and benefits discussed,  Surgical consent,  Pre-op evaluation,  At surgeon's request and post-op pain management  Laterality: Right and Lower  Prep: chloraprep       Needles:  Injection technique: Single-shot  Needle Type: Echogenic Needle     Needle Length: 9cm  Needle Gauge: 21     Additional Needles:   Procedures:,,,, ultrasound used (permanent image in chart),,,,  Narrative:  Start time: 05/16/2017 6:56 AM End time: 05/16/2017 7:03 AM Injection made incrementally with aspirations every 5 mL.  Performed by: Personally  Anesthesiologist: Annye Asa, MD  Additional Notes: Pt identified in Holding room.  Monitors applied. Working IV access confirmed. Sterile prep, drape R thigh.  #2iga ECHOgenic needle into adductor canal with US guidance.  20cc 0.75% Ropivacaine injected incrementally after negative test dose.  Patient asymptomatic, VSS, no heme aspirated, tolerated well.  Jenita Seashore, MD

## 2017-05-16 NOTE — H&P (Signed)
PREOPERATIVE H&P  Chief Complaint: RIGHT KNEE DEGENERATIVE JOINT DISEASE  HPI: Ariel Harris is a 72 y.o. female who presents for surgical treatment of RIGHT KNEE DEGENERATIVE JOINT DISEASE.  She denies any changes in medical history.  Past Medical History:  Diagnosis Date  . Chest pain   . Hyperlipidemia   . Hypertension   . PAC (premature atrial contraction)   . SVT (supraventricular tachycardia) (HCC)    Past Surgical History:  Procedure Laterality Date  . KNEE SURGERY     BIL  . None     Social History   Socioeconomic History  . Marital status: Divorced    Spouse name: Not on file  . Number of children: Not on file  . Years of education: Not on file  . Highest education level: Not on file  Occupational History  . Not on file  Social Needs  . Financial resource strain: Not on file  . Food insecurity:    Worry: Not on file    Inability: Not on file  . Transportation needs:    Medical: Not on file    Non-medical: Not on file  Tobacco Use  . Smoking status: Never Smoker  . Smokeless tobacco: Never Used  Substance and Sexual Activity  . Alcohol use: No  . Drug use: No  . Sexual activity: Not on file  Lifestyle  . Physical activity:    Days per week: Not on file    Minutes per session: Not on file  . Stress: Not on file  Relationships  . Social connections:    Talks on phone: Not on file    Gets together: Not on file    Attends religious service: Not on file    Active member of club or organization: Not on file    Attends meetings of clubs or organizations: Not on file    Relationship status: Not on file  Other Topics Concern  . Not on file  Social History Narrative   Negative family `history--Specifically artery disease or MI. The patient has two siblings died at a young age from fevers.   The patient is separated and single. She is retired but used to work at a [employer] and a [employer]. She is independent of daily living. She has never drink or  smoke or done drugs   No family history on file. Allergies  Allergen Reactions  . Indomethacin Swelling  . Tramadol     Sweaty, made feel bad   Prior to Admission medications   Medication Sig Start Date End Date Taking? Authorizing Provider  Cholecalciferol (VITAMIN D PO) Take 1 tablet by mouth daily.   Yes [provider]  metoprolol tartrate (LOPRESSOR) 25 MG tablet Take 1 tablet (25 mg total) by mouth 2 (two) times daily. 04/09/17 07/08/17 Yes McVey, [name] Mink, PA-C  pravastatin (PRAVACHOL) 40 MG tablet Take 1 tablet (40 mg total) by mouth daily. 04/09/17  Yes McVey, [name] Mink, PA-C  traMADol (ULTRAM) 50 MG tablet Take 1-2 tablets (50-100 mg total) by mouth 3 (three) times daily as needed. 04/24/17  Yes Leandrew Koyanagi, MD  alendronate (FOSAMAX) 70 MG tablet Take 70 mg by mouth once a week. Take with a full glass of water on an empty stomach. (Every Tuesday)    [provider]  meloxicam (MOBIC) 7.5 MG tablet TAKE 1 TABLET BY MOUTH 2 TIMES DAILY AS NEEDED FOR PAIN. Patient not taking: Reported on 05/06/2017 04/09/17   McVey, [name] Mink, PA-C  trolamine salicylate (ASPERCREME/ALOE) 10 % cream Apply 1 application topically as needed (knee pain). Patient not taking: Reported on 05/06/2017 11/01/16   McVey, [name] Mink, PA-C     Positive ROS: All other systems have been reviewed and were otherwise negative with the exception of those mentioned in the HPI and as above.  Physical Exam: General: Alert, no acute distress Cardiovascular: No pedal edema Respiratory: No cyanosis, no use of accessory musculature GI: abdomen soft Skin: No lesions in the area of chief complaint Neurologic: Sensation intact distally Psychiatric: Patient is competent for consent with normal mood and affect Lymphatic: no lymphedema  MUSCULOSKELETAL: exam stable  Assessment: RIGHT KNEE DEGENERATIVE JOINT DISEASE  Plan: Plan for Procedure(s): RIGHT TOTAL KNEE  ARTHROPLASTY  The risks benefits and alternatives were discussed with the patient including but not limited to the risks of nonoperative treatment, versus surgical intervention including infection, bleeding, nerve injury,  blood clots, cardiopulmonary complications, morbidity, mortality, among others, and they were willing to proceed.   Anticipated LOS equal to or greater than 2 midnights due to - Age 70 and older with one or more of the following:  - Obesity  - Expected need for hospital services (PT, OT, Nursing) required for safe  discharge  - Anticipated need for postoperative skilled nursing care or inpatient rehab  - Active co-morbidities: None  Eduard Roux, MD   05/16/2017 7:12 AM

## 2017-05-16 NOTE — Op Note (Signed)
Total Knee Arthroplasty Procedure Note  Preoperative diagnosis: Right knee osteoarthritis  Postoperative diagnosis:same  Operative procedure: Right total knee arthroplasty. CPT [phone]  Surgeon: N. Eduard Roux, MD  Assist: Madalyn Rob, PA-C; necessary for the timely completion of procedure and due to complexity of procedure.  Anesthesia: Spinal, regional  Tourniquet time: 60 mins  Implants used: St[employer]ker Triatholon Femur: PS 4 Tibia: 4 Patella: 32 mm Polyethylene: 9 mm  Indication: Ariel Harris is a 72 y.o. year old female with a history of knee pain. Having failed conservative management, the patient elected to proceed with a total knee arthroplasty.  We have reviewed the risk and benefits of the surgery and they elected to proceed after voicing understanding.  Procedure:  After informed consent was obtained and understanding of the risk were voiced including but not limited to bleeding, infection, damage to surrounding structures including nerves and vessels, blood clots, leg length inequality and the failure to achieve desired results, the operative extremity was marked with verbal confirmation of the patient in the holding area.   The patient was then brought to the operating room and transported to the operating room table in the supine position.  A tourniquet was applied to the operative extremity around the upper thigh. The operative limb was then prepped and draped in the usual sterile fashion and preoperative antibiotics were administered.  A time out was performed prior to the start of surgery confirming the correct extremity, preoperative antibiotic administration, as well as team members, implants and instruments available for the case. Correct surgical site was also confirmed with preoperative radiographs. The limb was then elevated for exsanguination and the tourniquet was inflated. A midline incision was made and a standard medial parapatellar approach was  performed.  The patella was prepared and sized to a 32 mm.  A cover was placed on the patella for protection from retractors.  We then turned our attention to the femur. Posterior cruciate ligament was sacrificed. Start site was drilled in the femur and the intramedullary distal femoral cutting guide was placed, set at 5 degrees valgus, taking 11 mm of distal resection due to flexion contracture. The distal cut was made. Osteophytes were then removed. Next, the proximal tibial cutting guide was placed with appropriate slope, varus/valgus alignment and depth of resection. The proximal tibial cut was made. Gap blocks were then used to assess the extension gap and alignment, and appropriate soft tissue releases were performed. Attention was turned back to the femur, which was sized using the sizing guide to a size 4. Appropriate rotation of the femoral component was determined using epicondylar axis, Whiteside's line, and assessing the flexion gap under ligament tension. The appropriate size 4-in-1 cutting block was placed and cuts were made. Posterior femoral osteophytes and uncapped bone were then removed with the curved osteotome. The tibia was sized for a size 4 component. The femoral box-cutting guide was placed and prepared for a PS femoral component. Trial components were placed, and stability was checked in full extension, mid-flexion, and deep flexion. Proper tibial rotation was determined and marked.  The patella tracked well without a lateral release. Trial components were then removed and tibial preparation performed. A posterior capsular injection comprising of 20 cc of 1.3% exparel and 40 cc of normal saline was performed for postoperative pain control. The bony surfaces were irrigated with a pulse lavage and then dried. Bone cement was vacuum mixed on the back table, and the final components sized above were cemented into  place. After cement had finished curing, excess cement was removed. The stability  of the construct was re-evaluated throughout a range of motion and found to be acceptable. The trial liner was removed, the knee was copiously irrigated, and the knee was re-evaluated for any excess bone debris. The real polyethylene liner, 9 mm thick, was inserted and checked to ensure the locking mechanism had engaged appropriately. The tourniquet was deflated and hemostasis was achieved. The wound was irrigated with normal saline.  One gram of vancomycin powder was placed in the surgical bed. A drain was not placed. Capsular closure was performed with a #1 vicryl, subcutaneous fat closed with a 0 vicryl suture, then subcutaneous tissue closed with interrupted 2.0 vicryl suture. The skin was then closed with a 3.0 monocryl. A sterile dressing was applied.  The patient was awakened in the operating room and taken to recovery in stable condition. All sponge, needle, and instrument counts were correct at the end of the case.  Position: supine  Complications: none.  Time Out: performed   Drains/Packing: none  Estimated blood loss: minimal  Returned to Recovery Room: in good condition.   Antibiotics: yes   Mechanical VTE (DVT) Prophylaxis: sequential compression devices, TED thigh-high  Chemical VTE (DVT) Prophylaxis: aspirin  Fluid Replacement  Crystalloid: see anesthesia record Blood: none  FFP: none   Specimens Removed: 1 to pathology   Sponge and Instrument Count Correct? yes   PACU: portable radiograph - knee AP and Lateral   Admission: inpatient status  Plan/RTC: Return in 2 weeks for wound check.   Weight Bearing/Load Lower Extremity: full   N. Eduard Roux, MD [place] 9:22 AM

## 2017-05-16 NOTE — Discharge Instructions (Signed)

## 2017-05-16 NOTE — Anesthesia Procedure Notes (Signed)
Procedure Name: MAC Date/Time: 05/16/2017 7:50 AM Performed by: Izora Gala, CRNA Pre-anesthesia Checklist: Patient identified, Emergency Drugs available, Suction available and Patient being monitored Patient Re-evaluated:Patient Re-evaluated prior to induction Oxygen Delivery Method: Simple face mask Ventilation: Oral airway inserted - appropriate to patient size Placement Confirmation: positive ETCO2 Dental Injury: Teeth and Oropharynx as per pre-operative assessment

## 2017-05-16 NOTE — Anesthesia Postprocedure Evaluation (Signed)
Anesthesia Post Note  Patient: Ariel Harris T Snook  Procedure(s) Performed: RIGHT TOTAL KNEE ARTHROPLASTY (Right )     Patient location during evaluation: PACU Anesthesia Type: Spinal and Regional Level of consciousness: awake and alert, patient cooperative and oriented Pain management: pain level controlled Vital Signs Assessment: post-procedure vital signs reviewed and stable Respiratory status: spontaneous breathing, nonlabored ventilation and respiratory function stable Cardiovascular status: blood pressure returned to baseline and stable Postop Assessment: spinal receding, patient able to bend at knees and no apparent nausea or vomiting Anesthetic complications: no    Last Vitals:  Vitals:   05/16/17 1134 05/16/17 1135  BP: (!) 115/101   Pulse: 61 62  Resp: 18 14  Temp: 36.5 C   SpO2: 96% 99%    Last Pain:  Vitals:   05/16/17 1135  TempSrc:   PainSc: 0-No pain                 Makiyla Linch,E. Nitin Mckowen

## 2017-05-16 NOTE — Transfer of Care (Signed)
Immediate Anesthesia Transfer of Care Note  Patient: Ariel Harris  Procedure(s) Performed: RIGHT TOTAL KNEE ARTHROPLASTY (Right )  Patient Location: PACU  Anesthesia Type:Spinal  Level of Consciousness: awake and alert   Airway & Oxygen Therapy: Patient Spontanous Breathing  Post-op Assessment: Report given to RN and Post -op Vital signs reviewed and stable  Post vital signs: Reviewed and stable  Last Vitals:  Vitals Value Taken Time  BP 125/61 05/16/2017 10:05 AM  Temp 36.4 C 05/16/2017 10:05 AM  Pulse 65 05/16/2017 10:11 AM  Resp 16 05/16/2017 10:11 AM  SpO2 97 % 05/16/2017 10:11 AM  Vitals shown include unvalidated device data.  Last Pain:  Vitals:   05/16/17 0620  TempSrc:   PainSc: 8       Patients Stated Pain Goal: 3 (85/92/92 4462)  Complications: No apparent anesthesia complications

## 2017-05-16 NOTE — Progress Notes (Signed)
There were no orders on this Patient in PAT nor DOS.   I called Dr. Smith Robert so we can add more lab work, with his permission.

## 2017-05-17 LAB — BASIC METABOLIC PANEL
Anion gap: 7 (ref 5–15)
BUN: 18 mg/dL (ref 6–20)
CALCIUM: 8.1 mg/dL — AB (ref 8.9–10.3)
CHLORIDE: 107 mmol/L (ref 101–111)
CO2: 23 mmol/L (ref 22–32)
CREATININE: 0.79 mg/dL (ref 0.44–1.00)
GFR calc non Af Amer: >60 mL/min (ref >60)
Glucose, Bld: 113 mg/dL — ABNORMAL HIGH (ref 65–99)
Potassium: 3.9 mmol/L (ref 3.5–5.1)
Sodium: 137 mmol/L (ref 135–145)

## 2017-05-17 LAB — CBC
HCT: 38.8 % (ref 36.0–46.0)
Hemoglobin: 12.5 g/dL (ref 12.0–15.0)
MCH: 28.6 pg (ref 26.0–34.0)
MCHC: 32.2 g/dL (ref 30.0–36.0)
MCV: 88.8 fL (ref 78.0–100.0)
PLATELETS: 170 10*3/uL (ref 150–400)
RBC: 4.37 MIL/uL (ref 3.87–5.11)
RDW: 12.9 % (ref 11.5–15.5)
WBC: 7.5 10*3/uL (ref 4.0–10.5)

## 2017-05-17 NOTE — Care Management Note (Signed)
Case Management Note  Patient Details  Name: Jatasia Gundrum MRN: [id number] Date of Birth: 09-May-1945  Subjective/Objective:    72 yr old female s/p right total knee arthroplasty.                Action/Plan: Patient was preoperatively setup with [facility], no changes. Case manager via interpreter discussed discharge plan. Patient lives with her daughter who attends school, she is home alone during day hrs. CM will continue to monitor.      Expected Discharge Date:    pending              Expected Discharge Plan:  [place]  In-House Referral:  Clinical Social Work  Discharge planning Services  CM Consult  Post Acute Care Choice:  Durable Medical Equipment, Home Health Choice offered to:  Patient  DME Arranged:  3-N-1, Walker rolling DME Agency:  [place]:  [facility] [place]:  [facility] (formerly [employer])  Status of Service:  In process, will continue to follow  If discussed at Long Length of Stay Meetings, dates discussed:    Additional Comments:  Ninfa Meeker, RN 05/17/2017, 10:13 AM

## 2017-05-17 NOTE — Evaluation (Signed)
Occupational Therapy Evaluation Patient Details Name: Ariel Harris MRN: [id number] DOB: August 11, 1945 Today's Date: 05/17/2017    History of Present Illness 72 y.o. female s/p R TKA 05/16/17. PMH includes: SVT, PAC, HTN, L TKA 1996   Clinical Impression   Patient is s/p R TKA surgery resulting in functional limitations due to the deficits listed below (see OT problem list). Pt prior to admission caregiver for 6 yo granddaughter. Question (A) available currently for 16 yo granddaughter. Pt reports that she can take care of herself and she has "children" that can drive her to the hospital to see her but reports she does not have support for herself. Pt does state "children" work. Uncertain why the granddaughter must remain with patient and not with extended family or her parents. Pt is not very forth coming with information and becomes tearful with questioning in regards to granddaughter. Pt asking for granddaughter to stay with her at the SNF at night.  Patient will benefit from skilled OT acutely to increase independence and safety with ADLS to allow discharge SNF. Pt could decide to refuse SNF in regards to granddaughter ability to stay with her at setting based on questions to therapist this session. Pt does not have (A) during the day and would be alone all day.     Follow Up Recommendations  SNF    Equipment Recommendations  Other (comment)(RW)    Recommendations for Other Services       Precautions / Restrictions Precautions Precautions: Fall;Knee Precaution Booklet Issued: Yes (comment) Precaution Comments: reviewed knee extension  Restrictions Weight Bearing Restrictions: No RLE Weight Bearing: Weight bearing as tolerated Other Position/Activity Restrictions: WBAT      Mobility Bed Mobility Overal bed mobility: Needs Assistance Bed Mobility: Supine to Sit     Supine to sit: Min guard     General bed mobility comments: in chair on arrival  Transfers Overall transfer  level: Needs assistance Equipment used: Rolling walker (2 wheeled) Transfers: Sit to/from Stand Sit to Stand: Min guard         General transfer comment: cues for hand placement    Balance Overall balance assessment: Needs assistance Sitting-balance support: Feet supported Sitting balance-Leahy Scale: Good     Standing balance support: Single extremity supported;During functional activity Standing balance-Leahy Scale: Fair Standing balance comment: pt able to use one hand ot wash face and holding onto the sink with other hand                           ADL either performed or assessed with clinical judgement   ADL Overall ADL's : Needs assistance/impaired Eating/Feeding: Modified independent   Grooming: Wash/dry face;Wash/dry hands;Supervision/safety;Standing Grooming Details (indicate cue type and reason): pt completed adl at sink level  Upper Body Bathing: Supervision/ safety   Lower Body Bathing: Moderate assistance           Toilet Transfer: Min guard;Regular Toilet;Grab bars Toilet Transfer Details (indicate cue type and reason): pt requires L UE to help lower to toilet surface. cues to extend R LE out prior to sitting Toileting- Clothing Manipulation and Hygiene: Min Tax inspector Details (indicate cue type and reason): educated on use of clean fresh linen for each shower and to avoid getting R LE under wear but showers are allowed. pt asking if she could sit on teh edge of her bathtub Functional mobility during ADLs: Min guard;Rolling walker  Vision Baseline Vision/History: No visual deficits       Perception     Praxis      Pertinent Vitals/Pain Pain Assessment: No/denies pain Pain Score: 2  Pain Location: R Knee Pain Descriptors / Indicators: Discomfort Pain Intervention(s): Monitored during session;Premedicated before session;Repositioned;Ice applied     Hand Dominance Right    Extremity/Trunk Assessment Upper Extremity Assessment Upper Extremity Assessment: Overall WFL for tasks assessed   Lower Extremity Assessment Lower Extremity Assessment: Defer to PT evaluation   Cervical / Trunk Assessment Cervical / Trunk Assessment: Normal   Communication Communication Communication: Prefers language other than English(Vietnamese)   Cognition Arousal/Alertness: Awake/alert Behavior During Therapy: WFL for tasks assessed/performed Overall Cognitive Status: Within Functional Limits for tasks assessed                                 General Comments: no family present to confirm but appropriate responses with responses to questions   General Comments  dressing with ace wrap in place. educated on wet to dry dressing    Exercises Total Joint Exercises Ankle Circles/Pumps: 10 reps;Right;AROM;Seated Quad Sets: 10 reps;Right;Strengthening;Seated Heel Slides: PROM;10 reps;Right;Seated Long Arc Quad: AROM;10 reps;Right;Seated Goniometric ROM: 8-100   Shoulder Instructions      Home Living Family/patient expects to be discharged to:: Private residence Living Arrangements: Other relatives(lives with 34 yo granddaughter) Available Help at Discharge: Available PRN/intermittently Type of Home: House Home Access: Stairs to enter Technical brewer of Steps: 5 Entrance Stairs-Rails: Can reach both Home Layout: One level     Bathroom Shower/Tub: Teacher, early years/pre: [place]: None   Additional Comments: pt reports that granddaughter is at school all day and will not return home until 4-5 pm. she has arranged for someone to drive her to school and to the hospital at night so she does not sleep there alone. pt tearful with questions regarding granddaughter's parents and ability to have someone (A) her while patient has rehab      Prior Functioning/Environment Level of Independence: Independent         Comments: drives and caregiver for 70 yo         OT Problem List: Decreased activity tolerance;Decreased knowledge of precautions;Decreased knowledge of use of DME or AE;Decreased range of motion;Decreased safety awareness;Decreased strength;Impaired balance (sitting and/or standing);Pain      OT Treatment/Interventions: Self-care/ADL training;Therapeutic exercise;Therapeutic activities;DME and/or AE instruction;Patient/family education;Balance training    OT Goals(Current goals can be found in the care plan section) Acute Rehab OT Goals Patient Stated Goal: to go to therapy and have granddaughter stay with her OT Goal Formulation: With patient Time For Goal Achievement: 05/31/17 Potential to Achieve Goals: Good  OT Frequency: Min 2X/week   Barriers to D/C: Decreased caregiver support  lives with 45 yo granddaughter and reports "no one else to help" but reports she has arranged for someone to drive 72 yo        Co-evaluation              AM-PAC PT "6 Clicks" Daily Activity     Outcome Measure Help from another person eating meals?: None Help from another person taking care of personal grooming?: A Little Help from another person toileting, which includes using toliet, bedpan, or urinal?: A Little Help from another person bathing (including washing, rinsing, drying)?: A Little Help from another person to put on and taking off  regular upper body clothing?: A Little Help from another person to put on and taking off regular lower body clothing?: A Lot 6 Click Score: 18   End of Session Equipment Utilized During Treatment: Rolling walker CPM Right Knee CPM Right Knee: Off Nurse Communication: Mobility status;Precautions  Activity Tolerance: Patient tolerated treatment well Patient left: in chair;with call bell/phone within reach  OT Visit Diagnosis: Unsteadiness on feet (R26.81)                Time: 1552-0802 OT Time Calculation (min): 36 min Charges:  OT General  Charges $OT Visit: 1 Visit OT Evaluation $OT Eval Moderate Complexity: 1 Mod OT Treatments $Self Care/Home Management : 8-22 mins G-Codes:      Jeri Modena   OTR/L Pager: [phone] Office: [phone] .   Parke Poisson B 05/17/2017, 12:39 PM

## 2017-05-17 NOTE — Progress Notes (Signed)
Nutrition Brief Note  Patient identified on the Malnutrition Screening Tool (MST) Report.  Wt Readings from Last 15 Encounters:  05/17/17 161 lb (73 kg)  05/06/17 161 lb 12.8 oz (73.4 kg)  04/09/17 162 lb (73.5 kg)  11/01/16 160 lb 9.6 oz (72.8 kg)  10/24/16 165 lb 6.4 oz (75 kg)  08/23/16 161 lb 12.8 oz (73.4 kg)  03/08/16 150 lb (68 kg)  01/08/16 150 lb (68 kg)  01/01/16 150 lb (68 kg)  08/10/15 156 lb (70.8 kg)  07/01/14 153 lb (69.4 kg)  01/24/14 154 lb (69.9 kg)  01/19/14 150 lb (68 kg)  01/13/14 156 lb (70.8 kg)  06/22/13 149 lb (67.6 kg)   Body mass index is 29.45 kg/m. Patient meets criteria for Overweight. based on current BMI.   Current diet order is Regula. Labs and medications reviewed. Weight has been stable.  No nutrition interventions warranted at this time. If nutrition issues arise, please consult RD.   Arthur Holms, RD, LDN Pager #: [phone] After-Hours Pager #: [phone]

## 2017-05-17 NOTE — Progress Notes (Signed)
Subjective: 1 Day Post-Op Procedure(s) (LRB): RIGHT TOTAL KNEE ARTHROPLASTY (Right) Patient reports pain as moderate.  Family at the bedside.  Does not appear uncomfortable.  Labs not back yet.  Motivated to get mobilizing.  Objective: Vital signs in last 24 hours: Temp:  [97.6 F (36.4 C)-98.3 F (36.8 C)] 98.3 F (36.8 C) (04/20 0446) Pulse Rate:  [48-67] 48 (04/20 0446) Resp:  [12-18] 17 (04/20 0446) BP: (102-135)/(56-101) 102/56 (04/20 0446) SpO2:  [93 %-100 %] 94 % (04/20 0446)  Intake/Output from previous day: 04/19 0701 - 04/20 0700 In: 1916.7 [I.V.:1716.7; IV Piggyback:200] Out: 1157 [Urine:3890; Blood:5] Intake/Output this shift: No intake/output data recorded.  No results for input(s): HGB in the last 72 hours. No results for input(s): WBC, RBC, HCT, PLT in the last 72 hours. No results for input(s): NA, K, CL, CO2, BUN, CREATININE, GLUCOSE, CALCIUM in the last 72 hours. Recent Labs    05/16/17 0633  INR 1.06    Sensation intact distally Intact pulses distally Dorsiflexion/Plantar flexion intact Incision: dressing C/D/I Compartment soft   Assessment/Plan: 1 Day Post-Op Procedure(s) (LRB): RIGHT TOTAL KNEE ARTHROPLASTY (Right) Up with therapy  Discharge to home hopefully next 1-2 days pending progress with therapy    Mcarthur Rossetti 05/17/2017, 7:40 AM

## 2017-05-17 NOTE — Care Management Note (Addendum)
Case Management Note  Patient Details  Name: Ariel Harris MRN: [id number] Date of Birth: 1945/08/19  Subjective/Objective:    72 yr old female s/p right total knee arthroplasty.                Action/Plan: Patient was preoperatively setup with [facility], no changes. Case manager via interpreter discussed discharge plan. Patient lives with her daughter who attends school, she is home alone during day hrs. CM will continue to monitor.    Expected Discharge Date:   pending               Expected Discharge Plan:     In-House Referral:  Clinical Social Work  Discharge planning Services  CM Consult  Post Acute Care Choice:    Choice offered to:     DME Arranged:    DME Agency:     HH Arranged:    HH Agency:     Status of Service:  In process, will continue to follow  If discussed at Long Length of Stay Meetings, dates discussed:    Additional Comments:  Ninfa Meeker, RN 05/17/2017, 9:19 AM

## 2017-05-17 NOTE — Progress Notes (Signed)
Orthopedic Tech Progress Note Patient Details:  Ariel Harris 11-Apr-1945 [id number]  CPM Right Knee CPM Right Knee: On Right Knee Flexion (Degrees): 90 Right Knee Extension (Degrees): 0  Post Interventions Patient Tolerated: Well Instructions Provided: Care of device  [place] Pink 05/17/2017, 4:25 PM

## 2017-05-17 NOTE — Progress Notes (Signed)
Physical Therapy Treatment Patient Details Name: Ariel Harris MRN: [id number] DOB: 1945/05/04 Today's Date: 05/17/2017    History of Present Illness 72 y.o. female s/p R TKA 05/16/17. PMH includes: SVT, PAC, HTN, L TKA 1996    PT Comments    Pt received in bed ready to participate in PT.  A translator was utilized for this session.  The translators name was Lockie Pares and her ID# is [phone].  Reviewed knee precautions with pt including explanation as why they are needed.  Pt repeated her knee precautions at end of session.  She was [employer] for safety during all ambulation, mobility and transfers.  She completed sit<->stand trials x3 only needing VC's for first attempt.  She ambulated 47ftx2 with a standing rest break halfway during which we discussed her d/c options.  Discussed SNFvsHome Health PT.  Pt only has 55 year old granddaughter living at home and is alone each day when granddaughter goes to school.  Pt remains a good candidate for SNF but has not made a decision on SNFvsHome Health PT at this time.     Follow Up Recommendations  Follow surgeon's recommendation for DC plan and follow-up therapies;Supervision/Assistance - 24 hour;SNF     Equipment Recommendations       Recommendations for Other Services OT consult     Precautions / Restrictions Precautions Precautions: Fall;Knee Precaution Booklet Issued: Yes (comment) Precaution Comments: reveiwed no pillow under knee, supine therex Restrictions Weight Bearing Restrictions: Yes RLE Weight Bearing: Weight bearing as tolerated Other Position/Activity Restrictions: WBAT    Mobility  Bed Mobility Overal bed mobility: Needs Assistance Bed Mobility: Supine to Sit     Supine to sit: Min guard     General bed mobility comments: VC's for hand placement ad sequencing.  Transfers Overall transfer level: Needs assistance Equipment used: Rolling walker (2 wheeled) Transfers: Sit to/from Stand Sit to Stand: Min guard          General transfer comment: Pt powered up into standing w/o physical assist.  Pt completed sit<->stand trials x3 needing VC's for sequencing/hand placement on first attempt and demonstrating proper technique on subsequent attempts.    Ambulation/Gait Ambulation/Gait assistance: Min guard Ambulation Distance (Feet): 75 Feet(75x2 with 3 minute standing rest break in between.) Assistive device: Rolling walker (2 wheeled) Gait Pattern/deviations: Step-to pattern Gait velocity: decreased   General Gait Details: Patient with slow and painful gait, short steps. min guard for safety.  VC's to keep neck straight.   Stairs             Wheelchair Mobility    Modified Rankin (Stroke Patients Only)       Balance Overall balance assessment: Needs assistance Sitting-balance support: Feet unsupported;No upper extremity supported                                        Cognition Arousal/Alertness: Awake/alert Behavior During Therapy: WFL for tasks assessed/performed Overall Cognitive Status: Within Functional Limits for tasks assessed                                        Exercises Total Joint Exercises Quad Sets: 10 reps;Right;Strengthening;Seated Heel Slides: PROM;10 reps;Right;Seated Long Arc Quad: AROM;10 reps;Right;Seated Goniometric ROM: 8-100    General Comments        Pertinent Vitals/Pain  Pain Assessment: 0-10 Pain Score: 2  Pain Location: R Knee Pain Descriptors / Indicators: Discomfort Pain Intervention(s): Limited activity within patient's tolerance;Monitored during session;Repositioned    Home Living                      Prior Function            PT Goals (current goals can now be found in the care plan section) Acute Rehab PT Goals Patient Stated Goal: go home PT Goal Formulation: With patient Potential to Achieve Goals: Fair Progress towards PT goals: Progressing toward goals    Frequency     7X/week      PT Plan      Co-evaluation              AM-PAC PT "6 Clicks" Daily Activity  Outcome Measure  Difficulty turning over in bed (including adjusting bedclothes, sheets and blankets)?: Unable Difficulty moving from lying on back to sitting on the side of the bed? : A Lot Difficulty sitting down on and standing up from a chair with arms (e.g., wheelchair, bedside commode, etc,.)?: A Little Help needed moving to and from a bed to chair (including a wheelchair)?: A Little Help needed walking in hospital room?: A Little Help needed climbing 3-5 steps with a railing? : A Lot 6 Click Score: 14    End of Session Equipment Utilized During Treatment: Gait belt Activity Tolerance: Patient tolerated treatment well Patient left: with call bell/phone within reach;in chair Nurse Communication: Mobility status PT Visit Diagnosis: Unsteadiness on feet (R26.81);Other abnormalities of gait and mobility (R26.89);Pain Pain - Right/Left: Left Pain - part of body: Knee     Time: 0922-1009 PT Time Calculation (min) (ACUTE ONLY): 47 min  Charges:  $Gait Training: 8-22 mins $Therapeutic Exercise: 8-22 mins $Therapeutic Activity: 8-22 mins                    G Codes:       Terri Skains, SPTA [phone]    Terri Skains 05/17/2017, 10:58 AM

## 2017-05-18 NOTE — Progress Notes (Addendum)
Physical Therapy Treatment Patient Details Name: Ariel Harris MRN: [id number] DOB: [birth year] Today's Date: 05/18/2017    History of Present Illness 72 y.o. female s/p R TKA 05/16/17. PMH includes: SVT, PAC, HTN, L TKA 1996    PT Comments    Pt seen second session for LE therex and gait training. Pt tolerated session well but does present with limited R knee flexion. AAROM required for some therex for attempts to increase flexion. Pt continues to report she has no one to assist during the day. Current plan remains appropriate.     Follow Up Recommendations  Follow surgeon's recommendation for DC plan and follow-up therapies;Supervision/Assistance - 24 hour;SNF     Equipment Recommendations  (TBD at next venue)    Recommendations for Other Services OT consult     Precautions / Restrictions Precautions Precautions: Fall;Knee Precaution Comments: knee precautions reviewed Restrictions Weight Bearing Restrictions: Yes RLE Weight Bearing: Weight bearing as tolerated    Mobility  Bed Mobility Overal bed mobility: Needs Assistance Bed Mobility: Supine to Sit;Sit to Supine     Supine to sit: Supervision Sit to supine: Supervision   General bed mobility comments: increased time and effort; no physical assist needed  Transfers Overall transfer level: Needs assistance Equipment used: Rolling walker (2 wheeled) Transfers: Sit to/from Stand Sit to Stand: Min guard         General transfer comment: cues for safe hand placement  Ambulation/Gait Ambulation/Gait assistance: Min guard Ambulation Distance (Feet): 80 Feet Assistive device: Rolling walker (2 wheeled) Gait Pattern/deviations: Step-to pattern;Decreased stance time - right;Decreased step length - left;Decreased weight shift to right;Antalgic Gait velocity: decreased   General Gait Details: verbal and visual cues for increased R knee flexion during swing phase and increased bilat step lengths; pt tends to begin  shuffling L foot when fatigued   Stairs             Wheelchair Mobility    Modified Rankin (Stroke Patients Only)       Balance Overall balance assessment: Needs assistance Sitting-balance support: Feet supported Sitting balance-Leahy Scale: Good     Standing balance support: Single extremity supported;During functional activity Standing balance-Leahy Scale: Fair                              Cognition Arousal/Alertness: Awake/alert Behavior During Therapy: WFL for tasks assessed/performed Overall Cognitive Status: Within Functional Limits for tasks assessed                                        Exercises Total Joint Exercises Ankle Circles/Pumps: AROM;Both;10 reps Quad Sets: AROM;Both;10 reps Short Arc Quad: AROM;10 reps;Right Heel Slides: AAROM;Right;10 reps Hip ABduction/ADduction: AROM;Right;10 reps Long Arc Quad: Right;10 reps;Seated;AAROM Knee Flexion: Right;AAROM;5 reps;Seated;Other (comment)(10 sec holds)    General Comments        Pertinent Vitals/Pain Pain Assessment: Faces Faces Pain Scale: Hurts little more Pain Location: R Knee with therex Pain Descriptors / Indicators: Discomfort;Guarding Pain Intervention(s): Limited activity within patient's tolerance;Monitored during session;Repositioned    Home Living                      Prior Function            PT Goals (current goals can now be found in the care plan section) Acute Rehab PT Goals Patient Stated  Goal: to go to therapy and have granddaughter stay with her PT Goal Formulation: With patient Potential to Achieve Goals: Fair Progress towards PT goals: Progressing toward goals    Frequency    7X/week      PT Plan Current plan remains appropriate    Co-evaluation              AM-PAC PT "6 Clicks" Daily Activity  Outcome Measure  Difficulty turning over in bed (including adjusting bedclothes, sheets and blankets)?: A  Lot Difficulty moving from lying on back to sitting on the side of the bed? : A Lot Difficulty sitting down on and standing up from a chair with arms (e.g., wheelchair, bedside commode, etc,.)?: Unable Help needed moving to and from a bed to chair (including a wheelchair)?: A Little Help needed walking in hospital room?: A Little Help needed climbing 3-5 steps with a railing? : A Lot 6 Click Score: 13    End of Session Equipment Utilized During Treatment: Gait belt Activity Tolerance: Patient tolerated treatment well Patient left: with call bell/phone within reach;in bed Nurse Communication: Mobility status PT Visit Diagnosis: Unsteadiness on feet (R26.81);Other abnormalities of gait and mobility (R26.89);Pain Pain - Right/Left: Left Pain - part of body: Knee     Time: 1535-1600 PT Time Calculation (min) (ACUTE ONLY): 25 min  Charges:  $Gait Training: 8-22 mins $Therapeutic Exercise: 8-22 mins                    G Codes:       Earney Navy, PTA Pager: [phone]     Darliss Cheney 05/18/2017, 4:07 PM

## 2017-05-18 NOTE — Progress Notes (Signed)
Subjective: 2 Days Post-Op Procedure(s) (LRB): RIGHT TOTAL KNEE ARTHROPLASTY (Right) Patient reports pain as moderate.  Working well with therapy.  SNF being recommended given that she will be home alone all day without assistance/family support.  Objective: Vital signs in last 24 hours: Temp:  [97.8 F (36.6 C)-98.2 F (36.8 C)] 97.9 F (36.6 C) (04/21 0522) Pulse Rate:  [50-51] 51 (04/21 0522) Resp:  [18] 18 (04/20 0827) BP: (108-136)/(56-75) 136/75 (04/21 0522) SpO2:  [95 %-99 %] 99 % (04/21 0522) Weight:  [161 lb (73 kg)] 161 lb (73 kg) (04/20 1418)  Intake/Output from previous day: 04/20 0701 - 04/21 0700 In: 360 [P.O.:360] Out: -  Intake/Output this shift: No intake/output data recorded.  Recent Labs    05/17/17 0712  HGB 12.5   Recent Labs    05/17/17 0712  WBC 7.5  RBC 4.37  HCT 38.8  PLT 170   Recent Labs    05/17/17 0712  NA 137  K 3.9  CL 107  CO2 23  BUN 18  CREATININE 0.79  GLUCOSE 113*  CALCIUM 8.1*   Recent Labs    05/16/17 0633  INR 1.06    Sensation intact distally Intact pulses distally Dorsiflexion/Plantar flexion intact Incision: scant drainage No cellulitis present Compartment soft   Assessment/Plan: 2 Days Post-Op Procedure(s) (LRB): RIGHT TOTAL KNEE ARTHROPLASTY (Right) Up with therapy Discharge to SNF hopefully tomorrow    Mcarthur Rossetti 05/18/2017, 7:51 AM

## 2017-05-18 NOTE — Progress Notes (Signed)
Patient developed red swollen cheeks and believes it was an allergic reaction to medication given. OxyContin was held due to it being a new medication for her and benadryl was given. Cheek's still puffy and less red. Will pass along in shift report to hold medication. MD please consider taking this mediation off.

## 2017-05-18 NOTE — Progress Notes (Signed)
Physical Therapy Treatment Patient Details Name: Ariel Harris MRN: [id number] DOB: 03-20-1945 Today's Date: 05/18/2017    History of Present Illness 72 y.o. female s/p R TKA 05/16/17. PMH includes: SVT, PAC, HTN, L TKA 1996    PT Comments    Patient is making progress toward mobility goals and reported less pain today vs yesterday. Pt required min guard assist for functional transfers and gait training. Family present. Continue to progress as tolerated.    Follow Up Recommendations  Follow surgeon's recommendation for DC plan and follow-up therapies;Supervision/Assistance - 24 hour;SNF     Equipment Recommendations  (TBD at next venue)    Recommendations for Other Services OT consult     Precautions / Restrictions Precautions Precautions: Fall;Knee Precaution Comments: knee precautions reviewed Restrictions Weight Bearing Restrictions: No RLE Weight Bearing: Weight bearing as tolerated    Mobility  Bed Mobility Overal bed mobility: Needs Assistance Bed Mobility: Supine to Sit     Supine to sit: Min guard     General bed mobility comments: min guard for safety; increased time and effort  Transfers Overall transfer level: Needs assistance Equipment used: Rolling walker (2 wheeled) Transfers: Sit to/from Stand Sit to Stand: Min guard         General transfer comment: cues for safe hand placement  Ambulation/Gait Ambulation/Gait assistance: Min guard Ambulation Distance (Feet): 80 Feet Assistive device: Rolling walker (2 wheeled) Gait Pattern/deviations: Step-to pattern;Decreased stance time - right;Decreased step length - left;Decreased weight shift to right;Antalgic Gait velocity: decreased   General Gait Details: cues for posture, sequencing, and R heel strike/toe off; pt with limited R knee flexion during swing phase    Stairs             Wheelchair Mobility    Modified Rankin (Stroke Patients Only)       Balance Overall balance  assessment: Needs assistance Sitting-balance support: Feet supported Sitting balance-Leahy Scale: Good     Standing balance support: Single extremity supported;During functional activity Standing balance-Leahy Scale: Fair                              Cognition Arousal/Alertness: Awake/alert Behavior During Therapy: WFL for tasks assessed/performed Overall Cognitive Status: Within Functional Limits for tasks assessed                                        Exercises      General Comments General comments (skin integrity, edema, etc.): family present in room      Pertinent Vitals/Pain Pain Assessment: Faces Faces Pain Scale: Hurts little more Pain Location: R Knee Pain Descriptors / Indicators: Discomfort;Guarding Pain Intervention(s): Limited activity within patient's tolerance;Monitored during session;Repositioned    Home Living                      Prior Function            PT Goals (current goals can now be found in the care plan section) Acute Rehab PT Goals Patient Stated Goal: to go to therapy and have granddaughter stay with her PT Goal Formulation: With patient Potential to Achieve Goals: Fair Progress towards PT goals: Progressing toward goals    Frequency    7X/week      PT Plan Current plan remains appropriate    Co-evaluation  AM-PAC PT "6 Clicks" Daily Activity  Outcome Measure  Difficulty turning over in bed (including adjusting bedclothes, sheets and blankets)?: A Lot Difficulty moving from lying on back to sitting on the side of the bed? : A Lot Difficulty sitting down on and standing up from a chair with arms (e.g., wheelchair, bedside commode, etc,.)?: Unable Help needed moving to and from a bed to chair (including a wheelchair)?: A Little Help needed walking in hospital room?: A Little Help needed climbing 3-5 steps with a railing? : A Lot 6 Click Score: 13    End of Session  Equipment Utilized During Treatment: Gait belt Activity Tolerance: Patient tolerated treatment well Patient left: with call bell/phone within reach;in chair;with family/visitor present Nurse Communication: Mobility status PT Visit Diagnosis: Unsteadiness on feet (R26.81);Other abnormalities of gait and mobility (R26.89);Pain Pain - Right/Left: Left Pain - part of body: Knee     Time: 1100-1118 PT Time Calculation (min) (ACUTE ONLY): 18 min  Charges:  $Gait Training: 8-22 mins                    G Codes:       Earney Navy, PTA Pager: [phone]     Darliss Cheney 05/18/2017, 11:24 AM

## 2017-05-19 ENCOUNTER — Encounter (HOSPITAL_COMMUNITY): Payer: Self-pay | Admitting: Orthopaedic Surgery

## 2017-05-19 DIAGNOSIS — R262 Difficulty in walking, not elsewhere classified: Secondary | ICD-10-CM | POA: Diagnosis not present

## 2017-05-19 DIAGNOSIS — I471 Supraventricular tachycardia: Secondary | ICD-10-CM | POA: Diagnosis not present

## 2017-05-19 DIAGNOSIS — Z96651 Presence of right artificial knee joint: Secondary | ICD-10-CM | POA: Diagnosis not present

## 2017-05-19 DIAGNOSIS — E785 Hyperlipidemia, unspecified: Secondary | ICD-10-CM | POA: Diagnosis not present

## 2017-05-19 DIAGNOSIS — Z471 Aftercare following joint replacement surgery: Secondary | ICD-10-CM | POA: Diagnosis not present

## 2017-05-19 DIAGNOSIS — M1711 Unilateral primary osteoarthritis, right knee: Secondary | ICD-10-CM | POA: Diagnosis not present

## 2017-05-19 DIAGNOSIS — R2681 Unsteadiness on feet: Secondary | ICD-10-CM | POA: Diagnosis not present

## 2017-05-19 DIAGNOSIS — M6281 Muscle weakness (generalized): Secondary | ICD-10-CM | POA: Diagnosis not present

## 2017-05-19 DIAGNOSIS — R488 Other symbolic dysfunctions: Secondary | ICD-10-CM | POA: Diagnosis not present

## 2017-05-19 DIAGNOSIS — S8990XA Unspecified injury of unspecified lower leg, initial encounter: Secondary | ICD-10-CM | POA: Diagnosis not present

## 2017-05-19 DIAGNOSIS — G8911 Acute pain due to trauma: Secondary | ICD-10-CM | POA: Diagnosis not present

## 2017-05-19 DIAGNOSIS — I10 Essential (primary) hypertension: Secondary | ICD-10-CM | POA: Diagnosis not present

## 2017-05-19 DIAGNOSIS — M810 Age-related osteoporosis without current pathological fracture: Secondary | ICD-10-CM | POA: Diagnosis not present

## 2017-05-19 NOTE — Progress Notes (Signed)
Physical Therapy Treatment Patient Details Name: Ariel Harris MRN: [id number] DOB: Aug 26, 1945 Today's Date: 05/19/2017    History of Present Illness 72 y.o. female s/p R TKA 05/16/17. PMH includes: SVT, PAC, HTN, L TKA 1996    PT Comments    Pt received in bed ready to participate in PT.  A translator was utilized for this session Mardene Celeste, [id number]).  This session focused on gait and therex.  Pt is increasing activity tolerance but remains to have limited R knee flexion.  VC's needed during most therex to prevent substitutions and get proper technique.  Emphasize AROM/AAROM exercises for future sessions to increase ROM of R knee.  Current plan remains appropriate.       Follow Up Recommendations  Follow surgeon's recommendation for DC plan and follow-up therapies;Supervision/Assistance - 24 hour;SNF     Equipment Recommendations       Recommendations for Other Services OT consult     Precautions / Restrictions Precautions Precautions: Fall;Knee Precaution Booklet Issued: Yes (comment) Precaution Comments: knee precautions reviewed Restrictions Weight Bearing Restrictions: Yes RLE Weight Bearing: Weight bearing as tolerated Other Position/Activity Restrictions: WBAT    Mobility  Bed Mobility Overal bed mobility: Needs Assistance Bed Mobility: Supine to Sit     Supine to sit: Supervision Sit to supine: Modified independent (Device/Increase time)   General bed mobility comments: increased time and effort; no physical assist needed  Transfers Overall transfer level: Needs assistance Equipment used: Rolling walker (2 wheeled) Transfers: Sit to/from Stand Sit to Stand: Min guard         General transfer comment: No cues needed, pt getting impulsive and trying things before interpreter can translate. Min guard for safety.    Ambulation/Gait Ambulation/Gait assistance: Min guard Ambulation Distance (Feet): 120 Feet Assistive device: Rolling walker (2  wheeled) Gait Pattern/deviations: Step-to pattern;Decreased step length - left(Increased step length R) Gait velocity: decreased   General Gait Details: VC to step past R foot with L and to not take such a long step with R foot.     Stairs             Wheelchair Mobility    Modified Rankin (Stroke Patients Only)       Balance Overall balance assessment: Needs assistance Sitting-balance support: Feet supported       Standing balance support: Single extremity supported;During functional activity                                Cognition Arousal/Alertness: Awake/alert Behavior During Therapy: WFL for tasks assessed/performed Overall Cognitive Status: Within Functional Limits for tasks assessed                                 General Comments: no family present to confirm but appropriate responses with responses to questions      Exercises Total Joint Exercises Ankle Circles/Pumps: AROM;Both;20 reps;Seated Quad Sets: AROM;10 reps;Right;Seated Short Arc Quad: (VC needed to prevent substitutions.  ) Heel Slides: Right;10 reps;AROM;Seated Hip ABduction/ADduction: AROM;Right;10 reps;Seated Long Arc Quad: Right;10 reps;Seated;AROM;Other (comment)(Eccentric ) Goniometric ROM: 10-95    General Comments        Pertinent Vitals/Pain Pain Assessment: 0-10 Pain Score: 2  Pain Location: R Knee with therex Pain Descriptors / Indicators: Discomfort;Guarding Pain Intervention(s): Limited activity within patient's tolerance;Monitored during session;Repositioned    Home Living  Prior Function            PT Goals (current goals can now be found in the care plan section) Acute Rehab PT Goals Patient Stated Goal: to go to therapy and have granddaughter stay with her PT Goal Formulation: With patient Potential to Achieve Goals: Fair Progress towards PT goals: Progressing toward goals    Frequency     7X/week      PT Plan Current plan remains appropriate    Co-evaluation              AM-PAC PT "6 Clicks" Daily Activity  Outcome Measure  Difficulty turning over in bed (including adjusting bedclothes, sheets and blankets)?: A Lot Difficulty moving from lying on back to sitting on the side of the bed? : A Lot Difficulty sitting down on and standing up from a chair with arms (e.g., wheelchair, bedside commode, etc,.)?: A Little Help needed moving to and from a bed to chair (including a wheelchair)?: A Little Help needed walking in hospital room?: A Little   6 Click Score: 13    End of Session Equipment Utilized During Treatment: Gait belt Activity Tolerance: Patient tolerated treatment well Patient left: with call bell/phone within reach;in chair Nurse Communication: Mobility status PT Visit Diagnosis: Unsteadiness on feet (R26.81);Other abnormalities of gait and mobility (R26.89);Pain Pain - Right/Left: Right Pain - part of body: Knee     Time: 1002-1029 PT Time Calculation (min) (ACUTE ONLY): 27 min  Charges:  $Gait Training: 8-22 mins $Therapeutic Exercise: 8-22 mins                    G Codes:       Terri Skains, SPTA [phone]    Terri Skains 05/19/2017, 1:34 PM

## 2017-05-19 NOTE — Plan of Care (Signed)
  Problem: Clinical Measurements: Goal: Ability to maintain clinical measurements within normal limits will improve Outcome: Adequate for Discharge Goal: Will remain free from infection Outcome: Adequate for Discharge Goal: Diagnostic test results will improve Outcome: Adequate for Discharge Goal: Respiratory complications will improve Outcome: Adequate for Discharge Goal: Cardiovascular complication will be avoided Outcome: Adequate for Discharge   Problem: Activity: Goal: Risk for activity intolerance will decrease Outcome: Adequate for Discharge   

## 2017-05-19 NOTE — Clinical Social Work Placement (Signed)
   CLINICAL SOCIAL WORK PLACEMENT  NOTE  Date:  05/19/2017  Patient Details  Name: Ariel Harris MRN: [id number] Date of Birth: 1945/05/05  Clinical Social Work is seeking post-discharge placement for this patient at the [place] level of care (*CSW will initial, date and re-position this form in  chart as items are completed):  Yes   Patient/family provided with [place] Work Department's list of facilities offering this level of care within the geographic area requested by the patient (or if unable, by the patient's family).  Yes   Patient/family informed of their freedom to choose among providers that offer the needed level of care, that participate in Medicare, Medicaid or managed care program needed by the patient, have an available bed and are willing to accept the patient.  Yes   Patient/family informed of [facility]'s ownership interest in [facility] and [facility], as well as of the fact that they are under no obligation to receive care at these facilities.  PASRR submitted to EDS on       PASRR number received on 05/19/17     Existing PASRR number confirmed on       FL2 transmitted to all facilities in geographic area requested by pt/family on 05/19/17     FL2 transmitted to all facilities within larger geographic area on       Patient informed that his/her managed care company has contracts with or will negotiate with certain facilities, including the following:        Yes   Patient/family informed of bed offers received.  Patient chooses bed at [facility]     Physician recommends and patient chooses bed at      Patient to be transferred to [facility] on 05/19/17.  Patient to be transferred to facility by [facility]     Patient family notified on 05/19/17 of transfer.  Name of family member notified:  granddaughter at bedside     PHYSICIAN       Additional Comment:     _______________________________________________ [name], LCSW 05/19/2017, 1:38 PM

## 2017-05-19 NOTE — Clinical Social Work Note (Signed)
Clinical Social Work Assessment  Patient Details  Name: Ariel Harris MRN: [id number] Date of Birth: 01/26/1946  Date of referral:  05/19/17               Reason for consult:  Facility Placement                Permission sought to share information with:  Chartered certified accountant granted to share information::  Yes, Verbal Permission Granted  Name::     Addilyne Backs  Agency::  SNF  Relationship::  granddaughter  Contact Information:     Housing/Transportation Living arrangements for the past 2 months:  Single Family Home Source of Information:  Patient, Other (Comment Required)(granddaughter) Patient Interpreter Needed:  [place] Criminal Activity/Legal Involvement Pertinent to Current Situation/Hospitalization:  No - Comment as needed Significant Relationships:  Adult Children, Other Family Members Lives with:  Adult Children, Relatives Do you feel safe going back to the place where you live?  No Need for family participation in patient care:  Yes (Comment)  Care giving concerns:  Pt from home with family with new impairment and will need short term rehab at dc.  Social Worker assessment / plan:  CSW met with patient and granddaughter at bedside. Pt from home and was independent with ADL's prior to hospitalization. Pt agreeable to SNF at discharge. Pt speaks vietnamese and very little english. Granddaughter was available for support.CSW explained SNF process/placement. CSW obtained permission to send to SNF's. CSW will f/u with disposition.  Employment status:  Retired Forensic scientist:  Medicare PT Recommendations:  [place] / Referral to community resources:  [place]  Patient/Family's Response to care:  Patient/granddaughter thanked CSW for assistance with disposition and is agreeable to SNF placement. CSW will provide list of SNF's offers.  Patient/Family's Understanding of and Emotional Response to Diagnosis,  Current Treatment, and Prognosis:  Pt/family has good understanding of impairment and are agreeable to SNF at discharge. Pt was independent pre hospitalization and hopes to return to baseline. CSW will assist with disposition.  Emotional Assessment Appearance:  Appears stated age Attitude/Demeanor/Rapport:  (Cooperative) Affect (typically observed):  Accepting, Appropriate Orientation:  Oriented to Self, Oriented to Place, Oriented to  Time Alcohol / Substance use:  Not Applicable Psych involvement (Current and /or in the community):  No (Comment)  Discharge Needs  Concerns to be addressed:  Discharge Planning Concerns Readmission within the last 30 days:  No Current discharge risk:  Dependent with Mobility, Lives alone Barriers to Discharge:  No Barriers Identified   [name], LCSW 05/19/2017, 10:59 AM

## 2017-05-19 NOTE — NC FL2 (Signed)
[place] LEVEL OF CARE SCREENING TOOL     IDENTIFICATION  Patient Name: Ariel Harris Birthdate: [birth year] Sex: female Admission Date (Current Location): 05/16/2017  [facility] and [place] Number:  Herbalist and Address:  [place]. [facility], [place] [address], [place], [place] [address]      Provider Number: [id number]  Attending Physician Name and Address:  Leandrew Koyanagi, MD  Relative Name and Phone Number:  Arnecia Ector, granddaughter, [phone]    Current Level of Care: SNF Recommended Level of Care: [place] Prior Approval Number:    Date Approved/Denied:   PASRR Number: [id number]  Discharge Plan: SNF    Current Diagnoses: Patient Active Problem List   Diagnosis Date Noted  . Total knee replacement status 05/16/2017  . Unilateral primary osteoarthritis, right knee 11/01/2016  . History of PSVT 01/13/2014  . Chest pain 03/28/2011  . Palpitations 03/12/2011  . Dyslipidemia 01/12/2010  . HYPERTENSION 01/12/2010  . CHEST PAIN, ATYPICAL 01/12/2010    Orientation RESPIRATION BLADDER Height & Weight     Self, Time, Situation, Place  Normal Continent Weight: 161 lb (73 kg) Height:  5\' 2"  (157.5 cm)  BEHAVIORAL SYMPTOMS/MOOD NEUROLOGICAL BOWEL NUTRITION STATUS      Continent Diet(See DC Summary)  AMBULATORY STATUS COMMUNICATION OF NEEDS Skin   Limited Assist Verbally Surgical wounds                       Personal Care Assistance Level of Assistance  Bathing, Feeding, Dressing Bathing Assistance: Limited assistance Feeding assistance: Independent Dressing Assistance: Limited assistance     Functional Limitations Info  Sight, Hearing, Speech Sight Info: Adequate Hearing Info: Adequate Speech Info: Adequate(Speaks Vietnamese)    SPECIAL CARE FACTORS FREQUENCY  PT (By licensed PT), OT (By licensed OT)     PT Frequency: 7x week OT Frequency: 7x week            Contractures       Additional Factors Info  Code Status, Allergies Code Status Info: Full Allergies Info: INDOMETHACIN, TRAMADOL            Current Medications (05/19/2017):  This is the current hospital active medication list Current Facility-Administered Medications  Medication Dose Route Frequency Provider Last Rate Last Dose  . 0.9 %  sodium chloride infusion   Intravenous Continuous Leandrew Koyanagi, MD 125 mL/hr at 05/16/17 1558    . acetaminophen (TYLENOL) tablet 325-650 mg  325-650 mg Oral Q6H PRN Leandrew Koyanagi, MD   650 mg at 05/18/17 2108  . alum & mag hydroxide-simeth (MAALOX/MYLANTA) 200-200-20 MG/5ML suspension 30 mL  30 mL Oral Q4H PRN Leandrew Koyanagi, MD      . aspirin chewable tablet 81 mg  81 mg Oral BID Leandrew Koyanagi, MD   81 mg at 05/19/17 0936  . celecoxib (CELEBREX) capsule 200 mg  200 mg Oral BID Leandrew Koyanagi, MD   200 mg at 05/17/17 2116  . diphenhydrAMINE (BENADRYL) 12.5 MG/5ML elixir 25 mg  25 mg Oral Q4H PRN Leandrew Koyanagi, MD      . docusate sodium (COLACE) capsule 100 mg  100 mg Oral BID Leandrew Koyanagi, MD   100 mg at 05/19/17 0936  . feeding supplement (ENSURE ENLIVE) (ENSURE ENLIVE) liquid 237 mL  237 mL Oral BID BM Leandrew Koyanagi, MD   237 mL at 05/18/17 1003  . gabapentin (NEURONTIN) capsule 300 mg  300 mg Oral TID Leandrew Koyanagi, MD   300 mg at 05/19/17 0935  . HYDROmorphone (DILAUDID) injection 0.5-1 mg  0.5-1 mg Intravenous Q4H PRN Leandrew Koyanagi, MD      . magnesium citrate solution 1 Bottle  1 Bottle Oral Once PRN Leandrew Koyanagi, MD      . menthol-cetylpyridinium (CEPACOL) lozenge 3 mg  1 lozenge Oral PRN Leandrew Koyanagi, MD       Or  . phenol (CHLORASEPTIC) mouth spray 1 spray  1 spray Mouth/Throat PRN Leandrew Koyanagi, MD      . methocarbamol (ROBAXIN) tablet 500 mg  500 mg Oral Q6H PRN Leandrew Koyanagi, MD   500 mg at 05/18/17 1503   Or  . methocarbamol (ROBAXIN) 500 mg in dextrose 5 % 50 mL IVPB  500 mg Intravenous Q6H PRN Leandrew Koyanagi, MD      . metoCLOPramide (REGLAN) tablet  5-10 mg  5-10 mg Oral Q8H PRN Leandrew Koyanagi, MD       Or  . metoCLOPramide (REGLAN) injection 5-10 mg  5-10 mg Intravenous Q8H PRN Leandrew Koyanagi, MD      . metoprolol tartrate (LOPRESSOR) tablet 25 mg  25 mg Oral BID Leandrew Koyanagi, MD   25 mg at 05/19/17 0935  . ondansetron (ZOFRAN) tablet 4 mg  4 mg Oral Q6H PRN Leandrew Koyanagi, MD   4 mg at 05/18/17 2108   Or  . ondansetron [facility]) injection 4 mg  4 mg Intravenous Q6H PRN Leandrew Koyanagi, MD   4 mg at 05/16/17 2236  . oxyCODONE (Oxy IR/ROXICODONE) immediate release tablet 10-15 mg  10-15 mg Oral Q4H PRN Leandrew Koyanagi, MD      . oxyCODONE (Oxy IR/ROXICODONE) immediate release tablet 5-10 mg  5-10 mg Oral Q4H PRN Leandrew Koyanagi, MD   5 mg at 05/18/17 1503  . oxyCODONE (OXYCONTIN) 12 hr tablet 10 mg  10 mg Oral Q12H Leandrew Koyanagi, MD   10 mg at 05/17/17 2116  . polyethylene glycol (MIRALAX / GLYCOLAX) packet 17 g  17 g Oral Daily PRN Leandrew Koyanagi, MD      . pravastatin (PRAVACHOL) tablet 40 mg  40 mg Oral Daily Leandrew Koyanagi, MD   40 mg at 05/19/17 0936  . sorbitol 70 % solution 30 mL  30 mL Oral Daily PRN Leandrew Koyanagi, MD         Discharge Medications: Please see discharge summary for a list of discharge medications.  Relevant Imaging Results:  Relevant Lab Results:   Additional Information SS#: [id number]  [name], LCSW

## 2017-05-19 NOTE — Progress Notes (Signed)
Subjective: 3 Days Post-Op Procedure(s) (LRB): RIGHT TOTAL KNEE ARTHROPLASTY (Right) Patient reports pain as mild.  Doing well this am.  Objective: Vital signs in last 24 hours: Temp:  [98.1 F (36.7 C)-98.4 F (36.9 C)] 98.3 F (36.8 C) (04/22 0344) Pulse Rate:  [55-74] 56 (04/22 0344) Resp:  [16] 16 (04/22 0344) BP: (112-123)/(60-69) 118/69 (04/22 0344) SpO2:  [96 %-99 %] 96 % (04/22 0344)  Intake/Output from previous day: 04/21 0701 - 04/22 0700 In: 720 [P.O.:720] Out: 3 [Urine:3] Intake/Output this shift: No intake/output data recorded.  Recent Labs    05/17/17 0712  HGB 12.5   Recent Labs    05/17/17 0712  WBC 7.5  RBC 4.37  HCT 38.8  PLT 170   Recent Labs    05/17/17 0712  NA 137  K 3.9  CL 107  CO2 23  BUN 18  CREATININE 0.79  GLUCOSE 113*  CALCIUM 8.1*   No results for input(s): LABPT, INR in the last 72 hours.  Neurologically intact Neurovascular intact Sensation intact distally Intact pulses distally Dorsiflexion/Plantar flexion intact Incision: scant drainage No cellulitis present Compartment soft  Anticipated LOS equal to or greater than 2 midnights due to - Age 80 and older with one or more of the following:  - Obesity  - Expected need for hospital services (PT, OT, Nursing) required for safe  discharge  - Anticipated need for postoperative skilled nursing care or inpatient rehab  - Active co-morbidities: None OR   - Unanticipated findings during/Post Surgery: Slow post-op progression: GI, pain control, mobility  - Patient is a high risk of re-admission due to: None   Assessment/Plan: 3 Days Post-Op Procedure(s) (LRB): RIGHT TOTAL KNEE ARTHROPLASTY (Right) Advance diet Up with therapy Discharge to SNF today WBAT RLE    Aundra Dubin 05/19/2017, 7:52 AM

## 2017-05-19 NOTE — Discharge Summary (Signed)
Patient ID: Ariel Harris MRN: [id number] DOB/AGE: 70/27/1947 70 y.o.  Admit date: 05/16/2017 Discharge date: 05/19/2017  Admission Diagnoses:  Active Problems:   Total knee replacement status   Discharge Diagnoses:  Same  Past Medical History:  Diagnosis Date  . Arthritis    "knees" (05/16/2017)  . Chest pain   . Hyperlipidemia   . Hypertension   . PAC (premature atrial contraction)   . SVT (supraventricular tachycardia) (HCC)     Surgeries: Procedure(s): RIGHT TOTAL KNEE ARTHROPLASTY on 05/16/2017   Consultants:   Discharged Condition: Improved  Hospital Course: Ariel Harris is an 72 y.o. female who was admitted 05/16/2017 for operative treatment of<principal problem not specified>. Patient has severe unremitting pain that affects sleep, daily activities, and work/hobbies. After pre-op clearance the patient was taken to the operating room on 05/16/2017 and underwent  Procedure(s): RIGHT TOTAL KNEE ARTHROPLASTY.    Patient was given perioperative antibiotics:  Anti-infectives (From admission, onward)   Start     Dose/Rate Route Frequency Ordered Stop   05/16/17 1500  ceFAZolin (ANCEF) IVPB 2g/100 mL premix     2 g 200 mL/hr over 30 Minutes Intravenous Every 6 hours 05/16/17 1437 05/17/17 0242   05/16/17 0831  vancomycin (VANCOCIN) powder  Status:  Discontinued       As needed 05/16/17 0831 05/16/17 1001       Patient was given sequential compression devices, early ambulation, and chemoprophylaxis to prevent DVT.  Patient benefited maximally from hospital stay and there were no complications.    Recent vital signs:  Patient Vitals for the past 24 hrs:  BP Temp Temp src Pulse Resp SpO2  05/19/17 0344 118/69 98.3 F (36.8 C) Oral (!) 56 16 96 %  05/18/17 2121 - - - 74 - -  05/18/17 2000 123/65 98.4 F (36.9 C) Oral (!) 59 - 96 %  05/18/17 1300 112/60 98.1 F (36.7 C) Oral (!) 55 - 99 %     Recent laboratory studies:  Recent Labs    05/17/17 0712  WBC 7.5   HGB 12.5  HCT 38.8  PLT 170  NA 137  K 3.9  CL 107  CO2 23  BUN 18  CREATININE 0.79  GLUCOSE 113*  CALCIUM 8.1*     Discharge Medications:   Allergies as of 05/19/2017      Reactions   Indomethacin Swelling   Tramadol    Sweaty, made feel bad      Medication List    TAKE these medications   alendronate 70 MG tablet Commonly known as:  FOSAMAX Take 70 mg by mouth once a week. Take with a full glass of water on an empty stomach. (Every Tuesday)   aspirin EC 81 MG tablet Take 1 tablet (81 mg total) by mouth 2 (two) times daily.   meloxicam 7.5 MG tablet Commonly known as:  MOBIC TAKE 1 TABLET BY MOUTH 2 TIMES DAILY AS NEEDED FOR PAIN.   methocarbamol 750 MG tablet Commonly known as:  ROBAXIN Take 1 tablet (750 mg total) by mouth 2 (two) times daily as needed for muscle spasms.   metoprolol tartrate 25 MG tablet Commonly known as:  LOPRESSOR Take 1 tablet (25 mg total) by mouth 2 (two) times daily.   ondansetron 4 MG tablet Commonly known as:  ZOFRAN Take 1-2 tablets (4-8 mg total) by mouth every 8 (eight) hours as needed for nausea or vomiting.   oxyCODONE 10 mg 12 hr tablet Commonly known as:  OXYCONTIN Take 1 tablet (10 mg total) by mouth every 12 (twelve) hours for 3 days.   oxyCODONE 5 MG immediate release tablet Commonly known as:  Oxy IR/ROXICODONE Take 1-3 tablets (5-15 mg total) by mouth every 4 (four) hours as needed.   pravastatin 40 MG tablet Commonly known as:  PRAVACHOL Take 1 tablet (40 mg total) by mouth daily.   promethazine 25 MG tablet Commonly known as:  PHENERGAN Take 1 tablet (25 mg total) by mouth every 6 (six) hours as needed for nausea.   senna-docusate 8.6-50 MG tablet Commonly known as:  SENOKOT S Take 1 tablet by mouth at bedtime as needed.   traMADol 50 MG tablet Commonly known as:  ULTRAM Take 1-2 tablets (50-100 mg total) by mouth 3 (three) times daily as needed.   trolamine salicylate 10 % cream Commonly known as:   ASPERCREME/ALOE Apply 1 application topically as needed (knee pain).   VITAMIN D PO Take 1 tablet by mouth daily.            Durable Medical Equipment  (From admission, onward)        Start     Ordered   05/17/17 1009  DME Walker rolling  Once    Comments:  Needs youth walker  Question:  Patient needs a walker to treat with the following condition  Answer:  Total knee replacement status   05/17/17 1010   05/16/17 1438  DME 3 n 1  Once     05/16/17 1437   05/16/17 1438  DME Bedside commode  Once    Question:  Patient needs a bedside commode to treat with the following condition  Answer:  Total knee replacement status   05/16/17 1437      Diagnostic Studies: Dg Knee Right Port  Result Date: 05/16/2017 CLINICAL DATA:  Total knee replacement. EXAM: PORTABLE RIGHT KNEE - 1-2 VIEW COMPARISON:  Right knee x-rays dated November 01, 2016. FINDINGS: The right knee demonstrates a total knee arthroplasty without evidence of hardware failure or complication. There is expected intra-articular air. There is no fracture or dislocation. The alignment is anatomic. Post-surgical changes noted in the surrounding soft tissues. IMPRESSION: 1. Interval right total knee arthroplasty without evidence of acute postoperative complication. Electronically Signed   By: Titus Dubin M.D.   On: 05/16/2017 10:45    Disposition: Discharge disposition: 03-Skilled [place]    Leandrew Koyanagi, MD In 2 weeks.   Specialty:  Orthopedic Surgery Why:  For suture removal, For wound re-check Contact information: [place] [phone]            Signed: Aundra Dubin 05/19/2017, 7:53 AM

## 2017-05-19 NOTE — Social Work (Signed)
Clinical Social Worker facilitated patient discharge including contacting patient family and facility to confirm patient discharge plans.  Clinical information faxed to facility and family agreeable with plan.    CSW arranged ambulance transport via [facility] to [place] [facility].    RN to call [phone] to give report prior to discharge.  Clinical Social Worker will sign off for now as social work intervention is no longer needed. Please consult us again if new need arises.  Coye Dawood, LCSW Clinical Social Worker [phone]    

## 2017-05-19 NOTE — Social Work (Addendum)
CSW met with family and discussed SNF offers and they accepted bed from [facility].   CSW f/u with SNF to confirm bed offer. SNF can offer semi private and they will accept until private room becomes available.  CSW will f/u.  Elissa Hefty, LCSW Clinical Social Worker [phone]

## 2017-05-19 NOTE — Progress Notes (Signed)
Report given to Caren Griffins, RN at [facility]F.

## 2017-05-20 ENCOUNTER — Non-Acute Institutional Stay (SKILLED_NURSING_FACILITY): Payer: Medicare Other | Admitting: Internal Medicine

## 2017-05-20 ENCOUNTER — Encounter: Payer: Self-pay | Admitting: Internal Medicine

## 2017-05-20 DIAGNOSIS — E785 Hyperlipidemia, unspecified: Secondary | ICD-10-CM

## 2017-05-20 DIAGNOSIS — I10 Essential (primary) hypertension: Secondary | ICD-10-CM

## 2017-05-20 DIAGNOSIS — M1711 Unilateral primary osteoarthritis, right knee: Secondary | ICD-10-CM | POA: Diagnosis not present

## 2017-05-20 DIAGNOSIS — M810 Age-related osteoporosis without current pathological fracture: Secondary | ICD-10-CM | POA: Diagnosis not present

## 2017-05-20 DIAGNOSIS — Z96651 Presence of right artificial knee joint: Secondary | ICD-10-CM | POA: Diagnosis not present

## 2017-05-20 NOTE — Progress Notes (Signed)
: Provider:  Noah Delaine. Sheppard Coil, MD Location:  [facility] Room Number: 425D Place of Service:  SNF ([phone])  PCP: Antonietta Jewel, MD Patient Care Team: Antonietta Jewel, MD as PCP - General (Internal Medicine)  Extended Emergency Contact Information Primary Emergency Contact: Black,Nghia Address: [place], [place] Phone: [phone] Relation: Son Secondary Emergency Contact: Parveen,Emily Address: [address]          [place], [place] [address] Johnnette Litter of [place] Phone: [phone] Mobile Phone: [phone] Relation: Grandaughter     Allergies: Indomethacin and Tramadol  Chief Complaint  Patient presents with  . New Admit To SNF    Admit to Facility    HPI: Patient is 72 y.o. female SVT, hypertension, hyperlipidemia, and right knee arthritis who was admitted to [facility] from 4/19-22 for a plan right knee replacement.  There were no complications noted.  Patient is admitted to skilled nursing facility for OT/PT.  At skilled nursing facility patient will be followed for hypertension treated with Lopressor 25 mg twice daily, hyperlipidemia treated with Pravachol and and osteoporosis treated with Fosamax.  Past Medical History:  Diagnosis Date  . Arthritis    "knees" (05/16/2017)  . Chest pain   . Hyperlipidemia   . Hypertension   . PAC (premature atrial contraction)   . SVT (supraventricular tachycardia) (HCC)     Past Surgical History:  Procedure Laterality Date  . JOINT REPLACEMENT    . KNEE ARTHROSCOPY Left 1996  . KNEE SURGERY Right [phone]   "open surgery; because of the pain"  . 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 TOTAL KNEE ARTHROPLASTY Right 05/16/2017   Procedure: RIGHT TOTAL KNEE ARTHROPLASTY;  Surgeon: Leandrew Koyanagi, MD;  Location: [place];  Service: Orthopedics;  Laterality: Right;    Allergies as of 05/20/2017      Reactions   Indomethacin Swelling   Tramadol    Sweaty, made feel bad      Medication List        Accurate as of 05/20/17  8:45 AM. Always use your most recent med list.          alendronate 70 MG tablet Commonly known as:  FOSAMAX Take 70 mg by mouth once a week. Take with a full glass of water on an empty stomach. (Every Tuesday)   aspirin EC 81 MG tablet Take 1 tablet (81 mg total) by mouth 2 (two) times daily.   meloxicam 7.5 MG tablet Commonly known as:  MOBIC TAKE 1 TABLET BY MOUTH 2 TIMES DAILY AS NEEDED FOR PAIN.   methocarbamol 750 MG tablet Commonly known as:  ROBAXIN Take 1 tablet (750 mg total) by mouth 2 (two) times daily as needed for muscle spasms.   metoprolol tartrate 25 MG tablet Commonly known as:  LOPRESSOR Take 1 tablet (25 mg total) by mouth 2 (two) times daily.   ondansetron 4 MG tablet Commonly known as:  ZOFRAN Take 1-2 tablets (4-8 mg total) by mouth every 8 (eight) hours as needed for nausea or vomiting.   oxyCODONE 5 MG immediate release tablet Commonly known as:  Oxy IR/ROXICODONE Take 1-3 tablets (5-15 mg total) by mouth every 4 (four) hours as needed.   pravastatin 40 MG tablet Commonly known as:  PRAVACHOL Take 1 tablet (40 mg total) by mouth daily.   promethazine 25 MG tablet Commonly known as:  PHENERGAN  Take 1 tablet (25 mg total) by mouth every 6 (six) hours as needed for nausea.   senna-docusate 8.6-50 MG tablet Commonly known as:  SENOKOT S Take 1 tablet by mouth at bedtime as needed.   traMADol 50 MG tablet Commonly known as:  ULTRAM Take 1-2 tablets (50-100 mg total) by mouth 3 (three) times daily as needed.   trolamine salicylate 10 % cream Commonly known as:  ASPERCREME/ALOE Apply 1 application topically as needed (knee pain).   VITAMIN D PO Take 1 tablet by mouth daily.       No orders of the defined types were placed in this encounter.   Immunization History  Administered Date(s) Administered  . Influenza, High Dose Seasonal PF 10/20/2016    . Influenza-Unspecified 10/25/2016  . Pneumococcal Conjugate-13 12/22/2016    Social History   Tobacco Use  . Smoking status: Never Smoker  . Smokeless tobacco: Never Used  Substance Use Topics  . Alcohol use: No    Family history is   Family History  Problem Relation Age of Onset  . Coronary artery disease Neg Hx   . Heart attack Neg Hx       Review of Systems  DATA OBTAINED: from patient, nurse GENERAL:  no fevers, fatigue, appetite changes SKIN: No itching, or rash EYES: No eye pain, redness, discharge EARS: No earache, tinnitus, change in hearing NOSE: No congestion, drainage or bleeding  MOUTH/THROAT: No mouth or tooth pain, No sore throat RESPIRATORY: No cough, wheezing, SOB CARDIAC: No chest pain, palpitations, lower extremity edema  GI: No abdominal pain, No N/V/D or constipation, No heartburn or reflux  GU: No dysuria, frequency or urgency, or incontinence  MUSCULOSKELETAL: No unrelieved bone/joint pain NEUROLOGIC: No headache, dizziness or focal weakness PSYCHIATRIC: No c/o anxiety or sadness   Vitals:   05/20/17 0834  BP: 114/74  Pulse: 76  Resp: 18  Temp: 98.5 F (36.9 C)  SpO2: 93%    SpO2 Readings from Last 1 Encounters:  05/20/17 93%   Body mass index is 29.45 kg/m.     Physical Exam  GENERAL APPEARANCE: Alert, conversant,  No acute distress.  SKIN: No diaphoresis rash HEAD: Normocephalic, atraumatic  EYES: Conjunctiva/lids clear. Pupils round, reactive. EOMs intact.  EARS: External exam WNL, canals clear. Hearing grossly normal.  NOSE: No deformity or discharge.  MOUTH/THROAT: Lips w/o lesions  RESPIRATORY: Breathing is even, unlabored. Lung sounds are clear   CARDIOVASCULAR: Heart RRR no murmurs, rubs or gallops. No peripheral edema.   GASTROINTESTINAL: Abdomen is soft, non-tender, not distended w/ normal bowel sounds. GENITOURINARY: Bladder non tender, not distended  MUSCULOSKELETAL: Minimal heat and swelling to right  knee NEUROLOGIC:  Cranial nerves 2-12 grossly intact. Moves all extremities  PSYCHIATRIC: Mood and affect appropriate to situation, no behavioral issues  Patient Active Problem List   Diagnosis Date Noted  . Total knee replacement status 05/16/2017  . Unilateral primary osteoarthritis, right knee 11/01/2016  . History of PSVT 01/13/2014  . Chest pain 03/28/2011  . Palpitations 03/12/2011  . Dyslipidemia 01/12/2010  . HYPERTENSION 01/12/2010  . CHEST PAIN, ATYPICAL 01/12/2010      Labs reviewed: Basic Metabolic Panel:    Component Value Date/Time   NA 137 05/17/2017 0712   NA 142 04/09/2017 1229   K 3.9 05/17/2017 0712   CL 107 05/17/2017 0712   CO2 23 05/17/2017 0712   GLUCOSE 113 (H) 05/17/2017 0712   BUN 18 05/17/2017 0712   BUN 22 04/09/2017 1229   CREATININE  0.79 05/17/2017 0712   CREATININE 0.80 03/03/2013 1025   CALCIUM 8.1 (L) 05/17/2017 0712   PROT 7.2 04/09/2017 1229   ALBUMIN 4.0 04/09/2017 1229   AST 23 04/09/2017 1229   ALT 17 04/09/2017 1229   ALKPHOS 34 (L) 04/09/2017 1229   BILITOT 0.8 04/09/2017 1229   GFRNONAA >60 05/17/2017 0712   GFRAA >60 05/17/2017 0712    Recent Labs    04/09/17 1229 05/06/17 0949 05/17/17 0712  NA 142 140 137  K 4.1 4.2 3.9  CL 107* 108 107  CO2 23 24 23   GLUCOSE 108* 109* 113*  BUN 22 15 18   CREATININE 0.84 0.83 0.79  CALCIUM 9.0 9.4 8.1*   Liver Function Tests: Recent Labs    10/24/16 1226 04/09/17 1229  AST 32 23  ALT 27 17  ALKPHOS 31* 34*  BILITOT 0.7 0.8  PROT 6.9 7.2  ALBUMIN 3.9 4.0   No results for input(s): LIPASE, AMYLASE in the last 8760 hours. No results for input(s): AMMONIA in the last 8760 hours. CBC: Recent Labs    10/24/16 1226 04/09/17 1229 05/06/17 0949 05/16/17 0633 05/17/17 0712  WBC 4.6 4.9 5.3  --  7.5  NEUTROABS 2.3 2.8  --  2.2  --   HGB 13.9 13.7 14.6  --  12.5  HCT 40.7 41.5 44.7  --  38.8  MCV 89 88 89.2  --  88.8  PLT 178 206 183  --  170   Lipid Recent Labs     04/09/17 1229  CHOL 202*  HDL 70  LDLCALC 118*  TRIG 69    Cardiac Enzymes: No results for input(s): CKTOTAL, CKMB, CKMBINDEX, TROPONINI in the last 8760 hours. BNP: Recent Labs    10/24/16 1226  BNP 135.6*   No results found for: [facility] Lab Results  Component Value Date   HGBA1C (H) 11/23/2009    6.0 (NOTE)                                                                       According to the ADA Clinical Practice Recommendations for 2011, when HbA1c is used as a screening test:   >=6.5%   Diagnostic of Diabetes Mellitus           (if abnormal result  is confirmed)  5.7-6.4%   Increased risk of developing Diabetes Mellitus  References:Diagnosis and Classification of Diabetes Mellitus,Diabetes AYTK,1601,09(NATFT 1):S62-S69 and Standards of Medical Care in         Diabetes - 2011,Diabetes DDUK,0254,27  (Suppl 1):S11-S61.   Lab Results  Component Value Date   TSH 0.65 12/09/2012   No results found for: VITAMINB12 No results found for: FOLATE No results found for: IRON, TIBC, FERRITIN  Imaging and Procedures obtained prior to SNF admission: Dg Knee Right Port  Result Date: 05/16/2017 CLINICAL DATA:  Total knee replacement. EXAM: PORTABLE RIGHT KNEE - 1-2 VIEW COMPARISON:  Right knee x-rays dated November 01, 2016. FINDINGS: The right knee demonstrates a total knee arthroplasty without evidence of hardware failure or complication. There is expected intra-articular air. There is no fracture or dislocation. The alignment is anatomic. Post-surgical changes noted in the surrounding soft tissues. IMPRESSION: 1. Interval right total knee arthroplasty without evidence of acute  postoperative complication. Electronically Signed   By: Titus Dubin M.D.   On: 05/16/2017 10:45     Not all labs, radiology exams or other studies done during hospitalization come through on my EPIC note; however they are reviewed by me.    Assessment and Plan  Right knee osteoarthritis/right knee  arthroplasty- no apparent complications SNF -admitted for OT/PT; prophylaxis with aspirin 81 mg twice daily  Hypertension SNF _control; continue Lopressor 25 mg twice daily  Hyperlipidemia SNF -not stated as uncontrolled; continue Pravachol 40 mill grams daily  Osteoporosis SNF -continue Fosamax 70 mg weekly   Time spent greater than 35 minutes;> 50% of time with patient was spent reviewing records, labs, tests and studies, counseling and developing plan of care  Webb Silversmith D. Sheppard Coil, MD

## 2017-05-25 ENCOUNTER — Encounter: Payer: Self-pay | Admitting: Internal Medicine

## 2017-05-25 DIAGNOSIS — E785 Hyperlipidemia, unspecified: Secondary | ICD-10-CM | POA: Insufficient documentation

## 2017-05-25 DIAGNOSIS — M810 Age-related osteoporosis without current pathological fracture: Secondary | ICD-10-CM | POA: Insufficient documentation

## 2017-05-26 ENCOUNTER — Ambulatory Visit (INDEPENDENT_AMBULATORY_CARE_PROVIDER_SITE_OTHER): Payer: Medicare Other | Admitting: Orthopaedic Surgery

## 2017-05-30 ENCOUNTER — Ambulatory Visit (INDEPENDENT_AMBULATORY_CARE_PROVIDER_SITE_OTHER): Payer: Medicare Other | Admitting: Orthopaedic Surgery

## 2017-05-30 ENCOUNTER — Encounter (INDEPENDENT_AMBULATORY_CARE_PROVIDER_SITE_OTHER): Payer: Self-pay | Admitting: Orthopaedic Surgery

## 2017-05-30 DIAGNOSIS — M1711 Unilateral primary osteoarthritis, right knee: Secondary | ICD-10-CM

## 2017-05-30 NOTE — Progress Notes (Signed)
   Post-Op Visit Note   Patient: Ariel Harris           Date of Birth: Jul 28, 1945           MRN: [id number] Visit Date: 05/30/2017 PCP: Antonietta Jewel, MD   Assessment & Plan:  Chief Complaint:  Chief Complaint  Patient presents with  . Right Knee - Pain, Routine Post Op   Visit Diagnoses:  1. Unilateral primary osteoarthritis, right knee     Plan: 2 week TKA follow up plan  Patient presents for 2 week follow up after right total knee replacement.  The incision is clean, dry, and intact and healing very well. There is no drainage, erythema, or signs of infection. Motion is progressing nicely.  We have encouraged continued use of TED hose as well as aspirin for DVT prophylaxis, and to continue with physical therapy exercises to work on strength and endurance. Patient is progressing well. Reminders were given about signs to be aware of including redness, drainage, increased pain, fevers, calf pain, shortness of breath, or any concern should generate a phone call or a return to see [place] immediately. Will plan to follow up at 6 weeks post op for next evaluation with radiographs at that time including 3 view xrays of the operative knee.   Follow-Up Instructions: Return in about 1 month (around 06/27/2017).   Orders:  No orders of the defined types were placed in this encounter.  No orders of the defined types were placed in this encounter.   Imaging: No results found.  PMFS History: Patient Active Problem List   Diagnosis Date Noted  . Hyperlipidemia 05/25/2017  . Osteoporosis 05/25/2017  . Total knee replacement status 05/16/2017  . Unilateral primary osteoarthritis, right knee 11/01/2016  . History of PSVT 01/13/2014  . Chest pain 03/28/2011  . Palpitations 03/12/2011  . Dyslipidemia 01/12/2010  . Hypertension 01/12/2010  . CHEST PAIN, ATYPICAL 01/12/2010   Past Medical History:  Diagnosis Date  . Arthritis    "knees" (05/16/2017)  . Chest pain   . Hyperlipidemia   .  Hypertension   . PAC (premature atrial contraction)   . SVT (supraventricular tachycardia) (HCC)     Family History  Problem Relation Age of Onset  . Coronary artery disease Neg Hx   . Heart attack Neg Hx     Past Surgical History:  Procedure Laterality Date  . JOINT REPLACEMENT    . KNEE ARTHROSCOPY Left 1996  . KNEE SURGERY Right [phone]   "open surgery; because of the pain"  . 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 TOTAL KNEE ARTHROPLASTY Right 05/16/2017   Procedure: RIGHT TOTAL KNEE ARTHROPLASTY;  Surgeon: Leandrew Koyanagi, MD;  Location: [place];  Service: Orthopedics;  Laterality: Right;   Social History   Occupational History  . Not on file  Tobacco Use  . Smoking status: Never Smoker  . Smokeless tobacco: Never Used  Substance and Sexual Activity  . Alcohol use: No  . Drug use: No  . Sexual activity: Not Currently

## 2017-06-04 DIAGNOSIS — M810 Age-related osteoporosis without current pathological fracture: Secondary | ICD-10-CM | POA: Diagnosis not present

## 2017-06-04 DIAGNOSIS — I10 Essential (primary) hypertension: Secondary | ICD-10-CM | POA: Diagnosis not present

## 2017-06-04 DIAGNOSIS — Z7982 Long term (current) use of aspirin: Secondary | ICD-10-CM | POA: Diagnosis not present

## 2017-06-04 DIAGNOSIS — I471 Supraventricular tachycardia: Secondary | ICD-10-CM | POA: Diagnosis not present

## 2017-06-04 DIAGNOSIS — Z96651 Presence of right artificial knee joint: Secondary | ICD-10-CM | POA: Diagnosis not present

## 2017-06-04 DIAGNOSIS — Z471 Aftercare following joint replacement surgery: Secondary | ICD-10-CM | POA: Diagnosis not present

## 2017-06-06 ENCOUNTER — Telehealth (INDEPENDENT_AMBULATORY_CARE_PROVIDER_SITE_OTHER): Payer: Self-pay | Admitting: Orthopaedic Surgery

## 2017-06-06 DIAGNOSIS — Z7982 Long term (current) use of aspirin: Secondary | ICD-10-CM | POA: Diagnosis not present

## 2017-06-06 DIAGNOSIS — I471 Supraventricular tachycardia: Secondary | ICD-10-CM | POA: Diagnosis not present

## 2017-06-06 DIAGNOSIS — Z96651 Presence of right artificial knee joint: Secondary | ICD-10-CM | POA: Diagnosis not present

## 2017-06-06 DIAGNOSIS — I10 Essential (primary) hypertension: Secondary | ICD-10-CM | POA: Diagnosis not present

## 2017-06-06 DIAGNOSIS — Z471 Aftercare following joint replacement surgery: Secondary | ICD-10-CM | POA: Diagnosis not present

## 2017-06-06 DIAGNOSIS — M810 Age-related osteoporosis without current pathological fracture: Secondary | ICD-10-CM | POA: Diagnosis not present

## 2017-06-06 NOTE — Telephone Encounter (Signed)
Gracee (PT) with [facility] called needing verbal orders for  [facility] PT  2 wk 1 and 3 wk 2. The number to contact Annabelle Harman is [phone]

## 2017-06-06 NOTE — Telephone Encounter (Signed)
yes

## 2017-06-06 NOTE — Telephone Encounter (Signed)
Ok for orders? 

## 2017-06-09 DIAGNOSIS — Z471 Aftercare following joint replacement surgery: Secondary | ICD-10-CM | POA: Diagnosis not present

## 2017-06-09 DIAGNOSIS — Z7982 Long term (current) use of aspirin: Secondary | ICD-10-CM | POA: Diagnosis not present

## 2017-06-09 DIAGNOSIS — Z96651 Presence of right artificial knee joint: Secondary | ICD-10-CM | POA: Diagnosis not present

## 2017-06-09 DIAGNOSIS — M810 Age-related osteoporosis without current pathological fracture: Secondary | ICD-10-CM | POA: Diagnosis not present

## 2017-06-09 DIAGNOSIS — I10 Essential (primary) hypertension: Secondary | ICD-10-CM | POA: Diagnosis not present

## 2017-06-09 DIAGNOSIS — I471 Supraventricular tachycardia: Secondary | ICD-10-CM | POA: Diagnosis not present

## 2017-06-09 NOTE — Telephone Encounter (Signed)
IC verbal given.  

## 2017-06-11 DIAGNOSIS — I10 Essential (primary) hypertension: Secondary | ICD-10-CM | POA: Diagnosis not present

## 2017-06-11 DIAGNOSIS — Z471 Aftercare following joint replacement surgery: Secondary | ICD-10-CM | POA: Diagnosis not present

## 2017-06-11 DIAGNOSIS — Z96651 Presence of right artificial knee joint: Secondary | ICD-10-CM | POA: Diagnosis not present

## 2017-06-11 DIAGNOSIS — M810 Age-related osteoporosis without current pathological fracture: Secondary | ICD-10-CM | POA: Diagnosis not present

## 2017-06-11 DIAGNOSIS — Z7982 Long term (current) use of aspirin: Secondary | ICD-10-CM | POA: Diagnosis not present

## 2017-06-11 DIAGNOSIS — I471 Supraventricular tachycardia: Secondary | ICD-10-CM | POA: Diagnosis not present

## 2017-06-12 DIAGNOSIS — I471 Supraventricular tachycardia: Secondary | ICD-10-CM | POA: Diagnosis not present

## 2017-06-12 DIAGNOSIS — M810 Age-related osteoporosis without current pathological fracture: Secondary | ICD-10-CM | POA: Diagnosis not present

## 2017-06-12 DIAGNOSIS — I10 Essential (primary) hypertension: Secondary | ICD-10-CM | POA: Diagnosis not present

## 2017-06-12 DIAGNOSIS — Z96651 Presence of right artificial knee joint: Secondary | ICD-10-CM | POA: Diagnosis not present

## 2017-06-12 DIAGNOSIS — Z471 Aftercare following joint replacement surgery: Secondary | ICD-10-CM | POA: Diagnosis not present

## 2017-06-12 DIAGNOSIS — Z7982 Long term (current) use of aspirin: Secondary | ICD-10-CM | POA: Diagnosis not present

## 2017-06-16 DIAGNOSIS — Z7982 Long term (current) use of aspirin: Secondary | ICD-10-CM | POA: Diagnosis not present

## 2017-06-16 DIAGNOSIS — M810 Age-related osteoporosis without current pathological fracture: Secondary | ICD-10-CM | POA: Diagnosis not present

## 2017-06-16 DIAGNOSIS — Z96651 Presence of right artificial knee joint: Secondary | ICD-10-CM | POA: Diagnosis not present

## 2017-06-16 DIAGNOSIS — I471 Supraventricular tachycardia: Secondary | ICD-10-CM | POA: Diagnosis not present

## 2017-06-16 DIAGNOSIS — I10 Essential (primary) hypertension: Secondary | ICD-10-CM | POA: Diagnosis not present

## 2017-06-16 DIAGNOSIS — Z471 Aftercare following joint replacement surgery: Secondary | ICD-10-CM | POA: Diagnosis not present

## 2017-06-18 DIAGNOSIS — M810 Age-related osteoporosis without current pathological fracture: Secondary | ICD-10-CM | POA: Diagnosis not present

## 2017-06-18 DIAGNOSIS — I10 Essential (primary) hypertension: Secondary | ICD-10-CM | POA: Diagnosis not present

## 2017-06-18 DIAGNOSIS — Z96651 Presence of right artificial knee joint: Secondary | ICD-10-CM | POA: Diagnosis not present

## 2017-06-18 DIAGNOSIS — Z7982 Long term (current) use of aspirin: Secondary | ICD-10-CM | POA: Diagnosis not present

## 2017-06-18 DIAGNOSIS — Z471 Aftercare following joint replacement surgery: Secondary | ICD-10-CM | POA: Diagnosis not present

## 2017-06-18 DIAGNOSIS — I471 Supraventricular tachycardia: Secondary | ICD-10-CM | POA: Diagnosis not present

## 2017-06-19 ENCOUNTER — Other Ambulatory Visit: Payer: Self-pay | Admitting: Internal Medicine

## 2017-06-20 DIAGNOSIS — Z96651 Presence of right artificial knee joint: Secondary | ICD-10-CM | POA: Diagnosis not present

## 2017-06-20 DIAGNOSIS — I10 Essential (primary) hypertension: Secondary | ICD-10-CM | POA: Diagnosis not present

## 2017-06-20 DIAGNOSIS — I471 Supraventricular tachycardia: Secondary | ICD-10-CM | POA: Diagnosis not present

## 2017-06-20 DIAGNOSIS — Z471 Aftercare following joint replacement surgery: Secondary | ICD-10-CM | POA: Diagnosis not present

## 2017-06-20 DIAGNOSIS — Z7982 Long term (current) use of aspirin: Secondary | ICD-10-CM | POA: Diagnosis not present

## 2017-06-20 DIAGNOSIS — M810 Age-related osteoporosis without current pathological fracture: Secondary | ICD-10-CM | POA: Diagnosis not present

## 2017-06-25 ENCOUNTER — Other Ambulatory Visit: Payer: Self-pay | Admitting: Internal Medicine

## 2017-06-30 ENCOUNTER — Other Ambulatory Visit: Payer: Self-pay | Admitting: Internal Medicine

## 2017-06-30 DIAGNOSIS — M170 Bilateral primary osteoarthritis of knee: Secondary | ICD-10-CM

## 2017-07-01 ENCOUNTER — Ambulatory Visit (INDEPENDENT_AMBULATORY_CARE_PROVIDER_SITE_OTHER): Payer: Medicare Other | Admitting: Orthopaedic Surgery

## 2017-07-01 ENCOUNTER — Ambulatory Visit (INDEPENDENT_AMBULATORY_CARE_PROVIDER_SITE_OTHER): Payer: Medicare Other

## 2017-07-01 ENCOUNTER — Encounter (INDEPENDENT_AMBULATORY_CARE_PROVIDER_SITE_OTHER): Payer: Self-pay | Admitting: Orthopaedic Surgery

## 2017-07-01 DIAGNOSIS — M1711 Unilateral primary osteoarthritis, right knee: Secondary | ICD-10-CM

## 2017-07-01 NOTE — Progress Notes (Signed)
   Post-Op Visit Note   Patient: Ariel Harris           Date of Birth: 30-Aug-1945           MRN: [id number] Visit Date: 07/01/2017 PCP: Antonietta Jewel, MD   Assessment & Plan:  Chief Complaint:  Chief Complaint  Patient presents with  . Right Knee - Pain   Visit Diagnoses:  1. Unilateral primary osteoarthritis, right knee     Plan: Patient is 6 weeks status post right total knee replacement.  She is overall doing well.  She has occasional pain.  She finished home physical therapy last Friday.  Her range of motion is progressing.  She lacks about 10 degrees of full extension and she is able to achieve about 95 degrees of flexion.  At this point I would like to get her referred to outpatient physical therapy.  She may discontinue DVT prophylaxis.  Dental prophylaxis reinforced.  This was all performed through an interpreter today.  Follow-up in 4 to 6 weeks for recheck.  She is traveling to [place] soon.  Follow-Up Instructions: Return in about 1 month (around 07/29/2017).   Orders:  Orders Placed This Encounter  Procedures  . XR KNEE 3 VIEW RIGHT   No orders of the defined types were placed in this encounter.   Imaging: Xr Knee 3 View Right  Result Date: 07/01/2017 Stable total knee replacement   PMFS History: Patient Active Problem List   Diagnosis Date Noted  . Hyperlipidemia 05/25/2017  . Osteoporosis 05/25/2017  . Total knee replacement status 05/16/2017  . Unilateral primary osteoarthritis, right knee 11/01/2016  . History of PSVT 01/13/2014  . Chest pain 03/28/2011  . Palpitations 03/12/2011  . Dyslipidemia 01/12/2010  . Hypertension 01/12/2010  . CHEST PAIN, ATYPICAL 01/12/2010   Past Medical History:  Diagnosis Date  . Arthritis    "knees" (05/16/2017)  . Chest pain   . Hyperlipidemia   . Hypertension   . PAC (premature atrial contraction)   . SVT (supraventricular tachycardia) (HCC)     Family History  Problem Relation Age of Onset  . Coronary artery  disease Neg Hx   . Heart attack Neg Hx     Past Surgical History:  Procedure Laterality Date  . JOINT REPLACEMENT    . KNEE ARTHROSCOPY Left 1996  . KNEE SURGERY Right [phone]   "open surgery; because of the pain"  . 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 TOTAL KNEE ARTHROPLASTY Right 05/16/2017   Procedure: RIGHT TOTAL KNEE ARTHROPLASTY;  Surgeon: Leandrew Koyanagi, MD;  Location: [place];  Service: Orthopedics;  Laterality: Right;   Social History   Occupational History  . Not on file  Tobacco Use  . Smoking status: Never Smoker  . Smokeless tobacco: Never Used  Substance and Sexual Activity  . Alcohol use: No  . Drug use: No  . Sexual activity: Not Currently

## 2017-07-08 ENCOUNTER — Ambulatory Visit (INDEPENDENT_AMBULATORY_CARE_PROVIDER_SITE_OTHER): Payer: Medicare Other | Admitting: Physician Assistant

## 2017-07-08 ENCOUNTER — Encounter: Payer: Self-pay | Admitting: Physician Assistant

## 2017-07-08 ENCOUNTER — Other Ambulatory Visit: Payer: Self-pay

## 2017-07-08 DIAGNOSIS — I10 Essential (primary) hypertension: Secondary | ICD-10-CM

## 2017-07-08 DIAGNOSIS — E785 Hyperlipidemia, unspecified: Secondary | ICD-10-CM | POA: Diagnosis not present

## 2017-07-08 MED ORDER — METOPROLOL TARTRATE 25 MG PO TABS
12.5000 mg | ORAL_TABLET | Freq: Two times a day (BID) | ORAL | 11 refills | Status: DC
Start: 2017-07-08 — End: 2017-11-06

## 2017-07-08 MED ORDER — PRAVASTATIN SODIUM 40 MG PO TABS: 40.0000 mg | ORAL_TABLET | Freq: Every day | ORAL | 3 refills | Status: DC

## 2017-07-08 NOTE — Patient Instructions (Addendum)
  Start taking 1/2 tablet of metoprolol in the morning and a 1/2 tablet at night.   Try Melatonin for sleep. You can buy this at your pharmacy. Come back and see me in 1 month if this is not working to help you sleep.   Come back and see me in 6 months.   Keep moving your body to the best of your abilities. You can do this at home, inside or outside, the park, community center, gym or anywhere you like. Consider a physical therapist or personal trainer to get started. Consider the app Sworkit. Fitness trackers such as smart-watches, smart-phones or Fitbits can help as well.   NUTRITION Eat more plants: colorful vegetables, nuts, seeds and berries.  Eat less sugar, salt, preservatives and processed foods.  Avoid toxins such as cigarettes and alcohol.  Drink water when you are thirsty. Warm water with a slice of lemon is an excellent morning drink to start the day.  Consider these websites for more information The Nutrition Source (https://www.henry-hernandez.biz/) Precision Nutrition (WindowBlog.ch)   RELAXATION Consider practicing mindfulness meditation or other relaxation techniques such as deep breathing, prayer, yoga, tai chi, massage. See website mindful.org or the apps Headspace or Calm to help get started.   SLEEP Try to get at least 7-8+ hours sleep per day. Regular exercise and reduced caffeine will help you sleep better. Practice good sleep hygeine techniques. See website sleep.org for more information.  Thank you for coming in today. I hope you feel we met your needs.  Feel free to call PCP if you have any questions or further requests.  Please consider signing up for MyChart if you do not already have it, as this is a great way to communicate with me.  Best,  Whitney McVey, PA-C  IF you received an x-ray today, you will receive an invoice from [facility]. Please contact [facility] at  [phone] with questions or concerns regarding your invoice.   IF you received labwork today, you will receive an invoice from [facility]. Please contact [facility] at [phone] with questions or concerns regarding your invoice.   Our billing staff will not be able to assist you with questions regarding bills from these companies.  You will be contacted with the lab results as soon as they are available. The fastest way to get your results is to activate your My Chart account. Instructions are located on the last page of this paperwork. If you have not heard from [place] regarding the results in 2 weeks, please contact this office.

## 2017-07-08 NOTE — Progress Notes (Signed)
Ariel Harris  MRN: [id number] DOB: [birth year]  PCP: Antonietta Jewel, MD  Subjective:  Pt is a 72 year old female who presents to clinic for HTN f/u. She speaks [place] and is here today with her daughter who is interpreting for her.   HTN - blood pressure today is 102/65. She is taking metoprolol 25mg  bid. Does not check home blood pressures. She is not exercising due to right knee pain - recent total knee replacement surgery 2 months ago.  H/o SVT-She sees cardiology Dr. Larna Daughters yearly. She does not smoke. Denies chest pain, palpitations, LES swelling, doe, shob.   Endorses difficulty sleeping since her knee surgery. She wakes up an cannot get back to sleep. Pt sleeps 3-5 hours/night. She has not taken anything to help.    Review of Systems  Constitutional: Negative for chills, diaphoresis, fatigue and fever.  Cardiovascular: Negative for chest pain, palpitations and leg swelling.  Musculoskeletal: Positive for arthralgias (right knee). Negative for gait problem and joint swelling.  Skin: Negative.   Psychiatric/Behavioral: Positive for sleep disturbance.    Patient Active Problem List   Diagnosis Date Noted  . Hyperlipidemia 05/25/2017  . Osteoporosis 05/25/2017  . Total knee replacement status 05/16/2017  . Unilateral primary osteoarthritis, right knee 11/01/2016  . History of PSVT 01/13/2014  . Chest pain 03/28/2011  . Palpitations 03/12/2011  . Dyslipidemia 01/12/2010  . Hypertension 01/12/2010  . CHEST PAIN, ATYPICAL 01/12/2010    Current Outpatient Medications on File Prior to Visit  Medication Sig Dispense Refill  . alendronate (FOSAMAX) 70 MG tablet Take 70 mg by mouth once a week. Take with a full glass of water on an empty stomach. (Every Tuesday)    . aspirin EC 81 MG tablet Take 1 tablet (81 mg total) by mouth 2 (two) times daily. 84 tablet 0  . Cholecalciferol (VITAMIN D PO) Take 1 tablet by mouth daily.    . metoprolol tartrate (LOPRESSOR) 25 MG  tablet Take 1 tablet (25 mg total) by mouth 2 (two) times daily. 60 tablet 11  . pravastatin (PRAVACHOL) 40 MG tablet Take 1 tablet (40 mg total) by mouth daily. 90 tablet 3  . promethazine (PHENERGAN) 25 MG tablet Take 1 tablet (25 mg total) by mouth every 6 (six) hours as needed for nausea. 30 tablet 1  . meloxicam (MOBIC) 7.5 MG tablet TAKE 1 TABLET BY MOUTH 2 TIMES DAILY AS NEEDED FOR PAIN. (Patient not taking: Reported on 07/08/2017) 30 tablet 0  . methocarbamol (ROBAXIN) 750 MG tablet Take 1 tablet (750 mg total) by mouth 2 (two) times daily as needed for muscle spasms. (Patient not taking: Reported on 07/08/2017) 60 tablet 0  . ondansetron (ZOFRAN) 4 MG tablet Take 1-2 tablets (4-8 mg total) by mouth every 8 (eight) hours as needed for nausea or vomiting. (Patient not taking: Reported on 07/08/2017) 40 tablet 0  . oxyCODONE (OXY IR/ROXICODONE) 5 MG immediate release tablet Take 1-3 tablets (5-15 mg total) by mouth every 4 (four) hours as needed. (Patient not taking: Reported on 07/08/2017) 30 tablet 0  . senna-docusate (SENOKOT S) 8.6-50 MG tablet Take 1 tablet by mouth at bedtime as needed. (Patient not taking: Reported on 07/08/2017) 30 tablet 1  . traMADol (ULTRAM) 50 MG tablet Take 1-2 tablets (50-100 mg total) by mouth 3 (three) times daily as needed. (Patient not taking: Reported on 07/08/2017) 30 tablet 2  . trolamine salicylate (ASPERCREME/ALOE) 10 % cream Apply 1 application topically as needed (knee pain). (Patient  not taking: Reported on 07/08/2017) 85 g 0   No current facility-administered medications on file prior to visit.     Allergies  Allergen Reactions  . Indomethacin Swelling  . Tramadol     Sweaty, made feel bad     Objective:  BP 102/65 (BP Location: Right Arm, Patient Position: Sitting, Cuff Size: Normal)   Pulse 60   Temp 98.2 F (36.8 C) (Oral)   Resp 18   Ht 5' 2.13" (1.578 m)   Wt 159 lb 9.6 oz (72.4 kg)   SpO2 97%   BMI 29.07 kg/m   Physical Exam    Constitutional: She is oriented to person, place, and time. No distress.  Cardiovascular: Normal rate, regular rhythm and normal heart sounds.  Musculoskeletal:       Legs: Well healed incisional scar anterior right knee. No swelling, erythema, warmth or drainage.   Neurological: She is alert and oriented to person, place, and time.  Skin: Skin is warm and dry.  Psychiatric: Judgment normal.  Vitals reviewed.   Lab Results  Component Value Date   CHOL 202 (H) 04/09/2017   CHOL 208 (H) 03/03/2013   CHOL (H) 11/24/2009    265        ATP III CLASSIFICATION:  <200     mg/dL   Desirable  200-239  mg/dL   Borderline High  >=240    mg/dL   High          Lab Results  Component Value Date   HDL 70 04/09/2017   HDL 70 03/03/2013   HDL 69 11/24/2009   Lab Results  Component Value Date   LDLCALC 118 (H) 04/09/2017   LDLCALC 123 (H) 03/03/2013   LDLCALC (H) 11/24/2009    183        Total Cholesterol/HDL:CHD Risk Coronary Heart Disease Risk Table                     Men   Women  1/2 Average Risk   3.4   3.3  Average Risk       5.0   4.4  2 X Average Risk   9.6   7.1  3 X Average Risk  23.4   11.0        Use the calculated Patient Ratio above and the CHD Risk Table to determine the patient's CHD Risk.        ATP III CLASSIFICATION (LDL):  <100     mg/dL   Optimal  100-129  mg/dL   Near or Above                    Optimal  130-159  mg/dL   Borderline  160-189  mg/dL   High  >190     mg/dL   Very High   Lab Results  Component Value Date   TRIG 69 04/09/2017   TRIG 75 03/03/2013   TRIG 67 11/24/2009   Lab Results  Component Value Date   CHOLHDL 2.9 04/09/2017   CHOLHDL 3.0 03/03/2013   CHOLHDL 3.8 11/24/2009   No results found for: LDLDIRECT  Assessment and Plan :  1. Essential hypertension - metoprolol tartrate (LOPRESSOR) 25 MG tablet; Take 0.5 tablets (12.5 mg total) by mouth 2 (two) times daily.  Dispense: 30 tablet; Refill: 11 -  Blood pressure today is  102/65. Pt has had several low blood pressure readings over the past 6 months. She is asymptomatic. Plan to reduce the  dose of Lopressor to 12.5mg  bid. She understands and agrees.  Encouraged DASH diet. RTC in 3 months for recheck.  2. Hyperlipidemia, unspecified hyperlipidemia type - pravastatin (PRAVACHOL) 40 MG tablet; Take 1 tablet (40 mg total) by mouth daily.  Dispense: 90 tablet; Refill: 3 - last lipid panel done 3 months ago. Plan to recheck in 3-6 months.   Mercer Pod, PA-C  [facility] [place] 07/08/2017 9:41 AM

## 2017-07-10 DIAGNOSIS — M25561 Pain in right knee: Secondary | ICD-10-CM | POA: Diagnosis not present

## 2017-07-10 DIAGNOSIS — R262 Difficulty in walking, not elsewhere classified: Secondary | ICD-10-CM | POA: Diagnosis not present

## 2017-07-10 DIAGNOSIS — M25461 Effusion, right knee: Secondary | ICD-10-CM | POA: Diagnosis not present

## 2017-07-10 DIAGNOSIS — M25661 Stiffness of right knee, not elsewhere classified: Secondary | ICD-10-CM | POA: Diagnosis not present

## 2017-07-14 DIAGNOSIS — R262 Difficulty in walking, not elsewhere classified: Secondary | ICD-10-CM | POA: Diagnosis not present

## 2017-07-14 DIAGNOSIS — M25561 Pain in right knee: Secondary | ICD-10-CM | POA: Diagnosis not present

## 2017-07-14 DIAGNOSIS — M25661 Stiffness of right knee, not elsewhere classified: Secondary | ICD-10-CM | POA: Diagnosis not present

## 2017-07-14 DIAGNOSIS — M25461 Effusion, right knee: Secondary | ICD-10-CM | POA: Diagnosis not present

## 2017-07-15 ENCOUNTER — Encounter: Payer: Self-pay | Admitting: Physician Assistant

## 2017-07-15 ENCOUNTER — Ambulatory Visit (INDEPENDENT_AMBULATORY_CARE_PROVIDER_SITE_OTHER): Payer: Medicare Other | Admitting: Physician Assistant

## 2017-07-15 VITALS — BP 110/67 | HR 62 | Temp 98.3°F | Resp 17 | Ht 62.5 in | Wt 159.0 lb

## 2017-07-15 DIAGNOSIS — L299 Pruritus, unspecified: Secondary | ICD-10-CM

## 2017-07-15 DIAGNOSIS — R21 Rash and other nonspecific skin eruption: Secondary | ICD-10-CM

## 2017-07-15 LAB — POCT CBC
Granulocyte percent: 50.3 % (ref 37–80)
HCT, POC: 42.6 % (ref 37.7–47.9)
Hemoglobin: 13.7 g/dL (ref 12.2–16.2)
Lymph, poc: 1.7 (ref 0.6–3.4)
MCH, POC: 28.3 pg (ref 27–31.2)
MCHC: 32.2 g/dL (ref 31.8–35.4)
MCV: 87.9 fL (ref 80–97)
MID (cbc): 0.5 (ref 0–0.9)
MPV: 8.1 fL (ref 0–99.8)
POC Granulocyte: 2.2 (ref 2–6.9)
POC LYMPH PERCENT: 39.1 % (ref 10–50)
POC MID %: 10.6 %M (ref 0–12)
Platelet Count, POC: 185 10*3/uL (ref 142–424)
RBC: 4.84 M/uL (ref 4.04–5.48)
RDW, POC: 14.1 %
WBC: 4.3 10*3/uL — AB (ref 4.6–10.2)

## 2017-07-15 LAB — CMP14+EGFR
ALT: 17 IU/L (ref 0–32)
AST: 21 IU/L (ref 0–40)
Albumin/Globulin Ratio: 1.2 (ref 1.2–2.2)
Albumin: 4.1 g/dL (ref 3.5–4.8)
Alkaline Phosphatase: 35 IU/L — ABNORMAL LOW (ref 39–117)
BUN/Creatinine Ratio: 18 (ref 12–28)
BUN: 14 mg/dL (ref 8–27)
Bilirubin Total: 1 mg/dL (ref 0.0–1.2)
CO2: 24 mmol/L (ref 20–29)
Calcium: 9.6 mg/dL (ref 8.7–10.3)
Chloride: 103 mmol/L (ref 96–106)
Creatinine, Ser: 0.8 mg/dL (ref 0.57–1.00)
GFR calc Af Amer: 86 mL/min/{1.73_m2} (ref >59)
GFR calc non Af Amer: 75 mL/min/{1.73_m2} (ref >59)
Globulin, Total: 3.3 g/dL (ref 1.5–4.5)
Glucose: 94 mg/dL (ref 65–99)
Potassium: 4 mmol/L (ref 3.5–5.2)
Sodium: 140 mmol/L (ref 134–144)
Total Protein: 7.4 g/dL (ref 6.0–8.5)

## 2017-07-15 MED ORDER — TRIAMCINOLONE ACETONIDE 0.5 % EX OINT: 1.0000 "application " | TOPICAL_OINTMENT | Freq: Two times a day (BID) | CUTANEOUS | 0 refills | Status: DC

## 2017-07-15 MED ORDER — HYDROXYZINE HCL 50 MG PO TABS
50.0000 mg | ORAL_TABLET | Freq: Every day | ORAL | 1 refills | Status: DC
Start: 2017-07-15 — End: 2017-11-06

## 2017-07-15 NOTE — Progress Notes (Signed)
   Ariel Harris  MRN: [id number] DOB: 06/16/1945  PCP: Antonietta Jewel, MD  Subjective:  Pt is a 72 year old female who presents to clinic for itching.  Itching happens on her torso and upper extremities at nighttime.  Also c/o rash. She has not tried anything to make it better.    Review of Systems  Constitutional: Negative for chills, diaphoresis, fatigue and fever.  HENT: Negative for congestion, postnasal drip, rhinorrhea, sinus pressure, sinus pain and sore throat.   Respiratory: Negative for cough, shortness of breath and wheezing.   Psychiatric/Behavioral: Negative for sleep disturbance.    Patient Active Problem List   Diagnosis Date Noted  . Hyperlipidemia 05/25/2017  . Osteoporosis 05/25/2017  . Total knee replacement status 05/16/2017  . Unilateral primary osteoarthritis, right knee 11/01/2016  . History of PSVT 01/13/2014  . Chest pain 03/28/2011  . Palpitations 03/12/2011  . Dyslipidemia 01/12/2010  . Hypertension 01/12/2010  . CHEST PAIN, ATYPICAL 01/12/2010    Current Outpatient Medications on File Prior to Visit  Medication Sig Dispense Refill  . alendronate (FOSAMAX) 70 MG tablet Take 70 mg by mouth once a week. Take with a full glass of water on an empty stomach. (Every Tuesday)    . aspirin EC 81 MG tablet Take 1 tablet (81 mg total) by mouth 2 (two) times daily. 84 tablet 0  . Cholecalciferol (VITAMIN D PO) Take 1 tablet by mouth daily.    . metoprolol tartrate (LOPRESSOR) 25 MG tablet Take 0.5 tablets (12.5 mg total) by mouth 2 (two) times daily. 30 tablet 11  . pravastatin (PRAVACHOL) 40 MG tablet Take 1 tablet (40 mg total) by mouth daily. 90 tablet 3  . promethazine (PHENERGAN) 25 MG tablet Take 1 tablet (25 mg total) by mouth every 6 (six) hours as needed for nausea. 30 tablet 1   No current facility-administered medications on file prior to visit.     Allergies  Allergen Reactions  . Indomethacin Swelling  . Tramadol     Sweaty, made feel bad       Objective:  BP 110/67   Pulse 62   Temp 98.3 F (36.8 C) (Oral)   Resp 17   Ht 5' 2.5" (1.588 m)   Wt 159 lb (72.1 kg)   SpO2 98%   BMI 28.62 kg/m   Physical Exam  Constitutional: She is oriented to person, place, and time. No distress.  Cardiovascular: Normal rate, regular rhythm and normal heart sounds.  Neurological: She is alert and oriented to person, place, and time.  Skin: Skin is warm and dry. Rash noted.  Psychiatric: Judgment normal.  Vitals reviewed.  Lab Results  Component Value Date   WBC 4.3 (A) 07/15/2017   HGB 13.7 07/15/2017   HCT 42.6 07/15/2017   MCV 87.9 07/15/2017   PLT 170 05/17/2017    Assessment and Plan :  1. Itching - CMP14+EGFR - hydrOXYzine (ATARAX/VISTARIL) 50 MG tablet; Take 1 tablet (50 mg total) by mouth at bedtime.  Dispense: 30 tablet; Refill: 1 - POCT CBC - Sedimentation rate  2. Rash and nonspecific skin eruption - triamcinolone ointment (KENALOG) 0.5 %; Apply 1 application topically 2 (two) times daily. Apply to leg  Dispense: 30 g; Refill: 0 - POCT CBC -RTC in one week to recheck rash and discuss labs.   Mercer Pod, PA-C  [facility] [place] 07/15/2017 12:00 PM

## 2017-07-15 NOTE — Patient Instructions (Addendum)
  Start taking Hydroxyzine '50mg'$  about 30 minutes before bedtime.  Put cream on your leg 1-2 times daily. Do not put this on your face or neck.  Come back and see me in 1 week  Thank you for coming in today. I hope you feel we met your needs.  Feel free to call PCP if you have any questions or further requests.  Please consider signing up for MyChart if you do not already have it, as this is a great way to communicate with me.  Best,  Whitney McVey, PA-C   IF you received an x-ray today, you will receive an invoice from [facility]. Please contact [facility] at [phone] with questions or concerns regarding your invoice.   IF you received labwork today, you will receive an invoice from [facility]. Please contact [facility] at [phone] with questions or concerns regarding your invoice.   Our billing staff will not be able to assist you with questions regarding bills from these companies.  You will be contacted with the lab results as soon as they are available. The fastest way to get your results is to activate your My Chart account. Instructions are located on the last page of this paperwork. If you have not heard from [place] regarding the results in 2 weeks, please contact this office.

## 2017-07-16 DIAGNOSIS — M25461 Effusion, right knee: Secondary | ICD-10-CM | POA: Diagnosis not present

## 2017-07-16 DIAGNOSIS — M25561 Pain in right knee: Secondary | ICD-10-CM | POA: Diagnosis not present

## 2017-07-16 DIAGNOSIS — M25661 Stiffness of right knee, not elsewhere classified: Secondary | ICD-10-CM | POA: Diagnosis not present

## 2017-07-16 DIAGNOSIS — R262 Difficulty in walking, not elsewhere classified: Secondary | ICD-10-CM | POA: Diagnosis not present

## 2017-07-16 LAB — SEDIMENTATION RATE: Sed Rate: 42 mm/h — ABNORMAL HIGH (ref 0–40)

## 2017-07-18 DIAGNOSIS — M25561 Pain in right knee: Secondary | ICD-10-CM | POA: Diagnosis not present

## 2017-07-18 DIAGNOSIS — M25661 Stiffness of right knee, not elsewhere classified: Secondary | ICD-10-CM | POA: Diagnosis not present

## 2017-07-18 DIAGNOSIS — R262 Difficulty in walking, not elsewhere classified: Secondary | ICD-10-CM | POA: Diagnosis not present

## 2017-07-18 DIAGNOSIS — M25461 Effusion, right knee: Secondary | ICD-10-CM | POA: Diagnosis not present

## 2017-07-21 DIAGNOSIS — M25561 Pain in right knee: Secondary | ICD-10-CM | POA: Diagnosis not present

## 2017-07-21 DIAGNOSIS — M25461 Effusion, right knee: Secondary | ICD-10-CM | POA: Diagnosis not present

## 2017-07-21 DIAGNOSIS — R262 Difficulty in walking, not elsewhere classified: Secondary | ICD-10-CM | POA: Diagnosis not present

## 2017-07-21 DIAGNOSIS — M25661 Stiffness of right knee, not elsewhere classified: Secondary | ICD-10-CM | POA: Diagnosis not present

## 2017-07-22 ENCOUNTER — Ambulatory Visit: Payer: Medicare Other | Admitting: Physician Assistant

## 2017-07-23 DIAGNOSIS — M25661 Stiffness of right knee, not elsewhere classified: Secondary | ICD-10-CM | POA: Diagnosis not present

## 2017-07-23 DIAGNOSIS — M25561 Pain in right knee: Secondary | ICD-10-CM | POA: Diagnosis not present

## 2017-07-23 DIAGNOSIS — R262 Difficulty in walking, not elsewhere classified: Secondary | ICD-10-CM | POA: Diagnosis not present

## 2017-07-23 DIAGNOSIS — M25461 Effusion, right knee: Secondary | ICD-10-CM | POA: Diagnosis not present

## 2017-07-25 DIAGNOSIS — R262 Difficulty in walking, not elsewhere classified: Secondary | ICD-10-CM | POA: Diagnosis not present

## 2017-07-25 DIAGNOSIS — M25561 Pain in right knee: Secondary | ICD-10-CM | POA: Diagnosis not present

## 2017-07-25 DIAGNOSIS — M25661 Stiffness of right knee, not elsewhere classified: Secondary | ICD-10-CM | POA: Diagnosis not present

## 2017-07-25 DIAGNOSIS — M25461 Effusion, right knee: Secondary | ICD-10-CM | POA: Diagnosis not present

## 2017-07-28 ENCOUNTER — Other Ambulatory Visit: Payer: Self-pay | Admitting: Internal Medicine

## 2017-07-28 DIAGNOSIS — M25561 Pain in right knee: Secondary | ICD-10-CM | POA: Diagnosis not present

## 2017-07-28 DIAGNOSIS — Z1231 Encounter for screening mammogram for malignant neoplasm of breast: Secondary | ICD-10-CM

## 2017-07-28 DIAGNOSIS — M25461 Effusion, right knee: Secondary | ICD-10-CM | POA: Diagnosis not present

## 2017-07-28 DIAGNOSIS — M25661 Stiffness of right knee, not elsewhere classified: Secondary | ICD-10-CM | POA: Diagnosis not present

## 2017-07-28 DIAGNOSIS — R262 Difficulty in walking, not elsewhere classified: Secondary | ICD-10-CM | POA: Diagnosis not present

## 2017-07-29 ENCOUNTER — Ambulatory Visit (INDEPENDENT_AMBULATORY_CARE_PROVIDER_SITE_OTHER): Payer: Medicare Other | Admitting: Orthopaedic Surgery

## 2017-07-29 ENCOUNTER — Encounter (INDEPENDENT_AMBULATORY_CARE_PROVIDER_SITE_OTHER): Payer: Self-pay | Admitting: Orthopaedic Surgery

## 2017-07-29 VITALS — Ht 62.0 in | Wt 159.0 lb

## 2017-07-29 DIAGNOSIS — M1711 Unilateral primary osteoarthritis, right knee: Secondary | ICD-10-CM

## 2017-07-29 NOTE — Progress Notes (Signed)
Patient is 74 days status post right total knee replacement.  She is scheduled to return back to [place] in a week.  She is doing well overall.  She is happy.  Surgical scar is fully healed.  Range of motion is excellent.  At this point I think is fine for her to visit [place] for the next month.  I would like to recheck her back.  Questions encouraged and answered.

## 2017-07-30 ENCOUNTER — Ambulatory Visit: Payer: Medicare Other | Admitting: Physician Assistant

## 2017-07-30 DIAGNOSIS — M25461 Effusion, right knee: Secondary | ICD-10-CM | POA: Diagnosis not present

## 2017-07-30 DIAGNOSIS — R262 Difficulty in walking, not elsewhere classified: Secondary | ICD-10-CM | POA: Diagnosis not present

## 2017-07-30 DIAGNOSIS — M25561 Pain in right knee: Secondary | ICD-10-CM | POA: Diagnosis not present

## 2017-07-30 DIAGNOSIS — M25661 Stiffness of right knee, not elsewhere classified: Secondary | ICD-10-CM | POA: Diagnosis not present

## 2017-08-01 DIAGNOSIS — M25561 Pain in right knee: Secondary | ICD-10-CM | POA: Diagnosis not present

## 2017-08-01 DIAGNOSIS — M25661 Stiffness of right knee, not elsewhere classified: Secondary | ICD-10-CM | POA: Diagnosis not present

## 2017-08-01 DIAGNOSIS — M25461 Effusion, right knee: Secondary | ICD-10-CM | POA: Diagnosis not present

## 2017-08-01 DIAGNOSIS — R262 Difficulty in walking, not elsewhere classified: Secondary | ICD-10-CM | POA: Diagnosis not present

## 2017-08-04 DIAGNOSIS — M25661 Stiffness of right knee, not elsewhere classified: Secondary | ICD-10-CM | POA: Diagnosis not present

## 2017-08-04 DIAGNOSIS — R262 Difficulty in walking, not elsewhere classified: Secondary | ICD-10-CM | POA: Diagnosis not present

## 2017-08-04 DIAGNOSIS — M25461 Effusion, right knee: Secondary | ICD-10-CM | POA: Diagnosis not present

## 2017-08-04 DIAGNOSIS — M25561 Pain in right knee: Secondary | ICD-10-CM | POA: Diagnosis not present

## 2017-08-06 ENCOUNTER — Telehealth: Payer: Self-pay | Admitting: *Deleted

## 2017-08-06 ENCOUNTER — Other Ambulatory Visit: Payer: Self-pay | Admitting: Physician Assistant

## 2017-08-06 DIAGNOSIS — L299 Pruritus, unspecified: Secondary | ICD-10-CM

## 2017-08-06 NOTE — Telephone Encounter (Signed)
Per request for refill on Hydroxyzine, called pt unable to contact her, 2 attempts made phone did not ring.

## 2017-08-06 NOTE — Telephone Encounter (Signed)
Pharmacy is requesting changes to Rx- for provider review.

## 2017-08-08 ENCOUNTER — Other Ambulatory Visit (INDEPENDENT_AMBULATORY_CARE_PROVIDER_SITE_OTHER): Payer: Self-pay | Admitting: Orthopaedic Surgery

## 2017-09-23 ENCOUNTER — Ambulatory Visit: Payer: Medicare Other

## 2017-09-30 ENCOUNTER — Encounter (INDEPENDENT_AMBULATORY_CARE_PROVIDER_SITE_OTHER): Payer: Self-pay | Admitting: Orthopaedic Surgery

## 2017-09-30 ENCOUNTER — Ambulatory Visit (INDEPENDENT_AMBULATORY_CARE_PROVIDER_SITE_OTHER): Payer: Medicare Other | Admitting: Orthopaedic Surgery

## 2017-09-30 DIAGNOSIS — M1711 Unilateral primary osteoarthritis, right knee: Secondary | ICD-10-CM

## 2017-09-30 DIAGNOSIS — S81801A Unspecified open wound, right lower leg, initial encounter: Secondary | ICD-10-CM | POA: Diagnosis not present

## 2017-09-30 MED ORDER — MUPIROCIN 2 % EX OINT: 1.0000 "application " | TOPICAL_OINTMENT | Freq: Two times a day (BID) | CUTANEOUS | 5 refills | Status: DC
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
## 2017-10-01 ENCOUNTER — Ambulatory Visit
Admission: RE | Admit: 2017-10-01 | Discharge: 2017-10-01 | Disposition: A | Discharge status: Home / Self Care | Payer: Medicare Other | Source: Ambulatory Visit | Attending: Internal Medicine | Admitting: Internal Medicine

## 2017-10-01 DIAGNOSIS — Z23 Encounter for immunization: Secondary | ICD-10-CM | POA: Diagnosis not present

## 2017-10-01 DIAGNOSIS — Z1231 Encounter for screening mammogram for malignant neoplasm of breast: Secondary | ICD-10-CM | POA: Diagnosis not present

## 2017-10-02 DIAGNOSIS — M25562 Pain in left knee: Secondary | ICD-10-CM | POA: Diagnosis not present

## 2017-10-02 DIAGNOSIS — M25661 Stiffness of right knee, not elsewhere classified: Secondary | ICD-10-CM | POA: Diagnosis not present

## 2017-10-02 DIAGNOSIS — M25662 Stiffness of left knee, not elsewhere classified: Secondary | ICD-10-CM | POA: Diagnosis not present

## 2017-10-02 DIAGNOSIS — M25561 Pain in right knee: Secondary | ICD-10-CM | POA: Diagnosis not present

## 2017-10-06 DIAGNOSIS — M25561 Pain in right knee: Secondary | ICD-10-CM | POA: Diagnosis not present

## 2017-10-06 DIAGNOSIS — M25661 Stiffness of right knee, not elsewhere classified: Secondary | ICD-10-CM | POA: Diagnosis not present

## 2017-10-06 DIAGNOSIS — M25662 Stiffness of left knee, not elsewhere classified: Secondary | ICD-10-CM | POA: Diagnosis not present

## 2017-10-06 DIAGNOSIS — M25562 Pain in left knee: Secondary | ICD-10-CM | POA: Diagnosis not present

## 2017-10-08 ENCOUNTER — Ambulatory Visit (INDEPENDENT_AMBULATORY_CARE_PROVIDER_SITE_OTHER): Payer: Medicare Other | Admitting: Family Medicine

## 2017-10-08 ENCOUNTER — Encounter (INDEPENDENT_AMBULATORY_CARE_PROVIDER_SITE_OTHER): Payer: Self-pay | Admitting: Family Medicine

## 2017-10-08 DIAGNOSIS — M25662 Stiffness of left knee, not elsewhere classified: Secondary | ICD-10-CM | POA: Diagnosis not present

## 2017-10-08 DIAGNOSIS — M79604 Pain in right leg: Secondary | ICD-10-CM

## 2017-10-08 DIAGNOSIS — M25661 Stiffness of right knee, not elsewhere classified: Secondary | ICD-10-CM | POA: Diagnosis not present

## 2017-10-08 DIAGNOSIS — M1712 Unilateral primary osteoarthritis, left knee: Secondary | ICD-10-CM | POA: Diagnosis not present

## 2017-10-08 DIAGNOSIS — M25562 Pain in left knee: Secondary | ICD-10-CM | POA: Diagnosis not present

## 2017-10-08 DIAGNOSIS — M25561 Pain in right knee: Secondary | ICD-10-CM | POA: Diagnosis not present

## 2017-10-08 MED ORDER — METHYLPREDNISOLONE ACETATE 40 MG/ML IJ SUSP: 40.0000 mg | Freq: Once | INTRAMUSCULAR | Status: DC

## 2017-10-08 MED ORDER — DOXYCYCLINE HYCLATE 100 MG PO TABS: 100.0000 mg | ORAL_TABLET | Freq: Two times a day (BID) | ORAL | 1 refills | Status: DC

## 2017-10-08 NOTE — Progress Notes (Signed)
Office Visit Note   Patient: Ariel Harris           Date of Birth: 12/03/1945           MRN: [id number] Visit Date: 10/08/2017 Requested by: Antonietta Jewel, MD [address] [place], [place] [address] PCP: Antonietta Jewel, MD  Subjective: Chief Complaint  Patient presents with  . Left Knee - Pain  . Right Leg - Pain, Follow-up    Lower leg     HPI: She is here with 2 concerns.  She has a right lower leg wound that is being treated with Bactroban.  She feels like it is getting worse, developing some redness around it and tenderness.  There is clear yellow fluid on her bandage when she changes it.  No pus, no fever or chills.  She also has osteoarthritis in her left knee.  She had right knee replacement about 5 months ago and is recovering in physical therapy.  Her left one is now bothering her quite a bit and she wonders whether she can have another injection in it and a referral to therapy for her left knee.  She does not feel like she is ready for surgery yet.                ROS: Noncontributory  Objective: Vital Signs: There were no vitals taken for this visit.  Physical Exam:  Right leg: She has an open wound on the anterior mid to distal tibia measuring about 5 mm.  There is clear fluid around it, no pus expressible.  There is surrounding erythema and induration measuring about 6 cm. Left knee: She has a flexion contracture of about 10 to 15 degrees.  Trace effusion, no warmth or erythema.  Tender around the medial and lateral joint lines.  Imaging: None today.  Assessment & Plan: #1.  Right lower leg wound with early cellulitis -She will continue with Bactroban topically and we will add doxycycline by mouth.  Follow-up with Dr. Erlinda Hong or myself if worsens.  #2.  Left knee osteoarthritis -Steroid injection today followed by physical therapy.   Follow-Up Instructions: No follow-ups on file.     Procedures: Left knee steroid injection: After sterile prep with  Betadine injected 3 cc 1% lidocaine without epinephrine and 40 mg methylprednisolone from lateral midpatellar approach.   PMFS History: Patient Active Problem List   Diagnosis Date Noted  . Hyperlipidemia 05/25/2017  . Osteoporosis 05/25/2017  . Total knee replacement status 05/16/2017  . Unilateral primary osteoarthritis, right knee 11/01/2016  . History of PSVT 01/13/2014  . Chest pain 03/28/2011  . Palpitations 03/12/2011  . Dyslipidemia 01/12/2010  . Hypertension 01/12/2010  . CHEST PAIN, ATYPICAL 01/12/2010   Past Medical History:  Diagnosis Date  . Arthritis    "knees" (05/16/2017)  . Chest pain   . Hyperlipidemia   . Hypertension   . PAC (premature atrial contraction)   . SVT (supraventricular tachycardia) (HCC)     Family History  Problem Relation Age of Onset  . Coronary artery disease Neg Hx   . Heart attack Neg Hx   . Breast cancer Neg Hx     Past Surgical History:  Procedure Laterality Date  . JOINT REPLACEMENT    . KNEE ARTHROSCOPY Left 1996  . KNEE SURGERY Right [phone]   "open surgery; because of the pain"  . 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 TOTAL KNEE ARTHROPLASTY Right 05/16/2017   Procedure: RIGHT TOTAL KNEE ARTHROPLASTY;  Surgeon: Leandrew Koyanagi, MD;  Location: [facility];  Service: Orthopedics;  Laterality: Right;   Social History   Occupational History  . Not on file  Tobacco Use  . Smoking status: Never Smoker  . Smokeless tobacco: Never Used  Substance and Sexual Activity  . Alcohol use: No  . Drug use: No  . Sexual activity: Not Currently

## 2017-10-10 DIAGNOSIS — M25661 Stiffness of right knee, not elsewhere classified: Secondary | ICD-10-CM | POA: Diagnosis not present

## 2017-10-10 DIAGNOSIS — M25562 Pain in left knee: Secondary | ICD-10-CM | POA: Diagnosis not present

## 2017-10-10 DIAGNOSIS — M25561 Pain in right knee: Secondary | ICD-10-CM | POA: Diagnosis not present

## 2017-10-10 DIAGNOSIS — M25662 Stiffness of left knee, not elsewhere classified: Secondary | ICD-10-CM | POA: Diagnosis not present

## 2017-10-13 DIAGNOSIS — M25562 Pain in left knee: Secondary | ICD-10-CM | POA: Diagnosis not present

## 2017-10-13 DIAGNOSIS — M25661 Stiffness of right knee, not elsewhere classified: Secondary | ICD-10-CM | POA: Diagnosis not present

## 2017-10-13 DIAGNOSIS — M25561 Pain in right knee: Secondary | ICD-10-CM | POA: Diagnosis not present

## 2017-10-13 DIAGNOSIS — M25662 Stiffness of left knee, not elsewhere classified: Secondary | ICD-10-CM | POA: Diagnosis not present

## 2017-10-15 DIAGNOSIS — M25562 Pain in left knee: Secondary | ICD-10-CM | POA: Diagnosis not present

## 2017-10-15 DIAGNOSIS — M25561 Pain in right knee: Secondary | ICD-10-CM | POA: Diagnosis not present

## 2017-10-15 DIAGNOSIS — M25661 Stiffness of right knee, not elsewhere classified: Secondary | ICD-10-CM | POA: Diagnosis not present

## 2017-10-15 DIAGNOSIS — M25662 Stiffness of left knee, not elsewhere classified: Secondary | ICD-10-CM | POA: Diagnosis not present

## 2017-10-17 DIAGNOSIS — M25562 Pain in left knee: Secondary | ICD-10-CM | POA: Diagnosis not present

## 2017-10-17 DIAGNOSIS — M25661 Stiffness of right knee, not elsewhere classified: Secondary | ICD-10-CM | POA: Diagnosis not present

## 2017-10-17 DIAGNOSIS — M25662 Stiffness of left knee, not elsewhere classified: Secondary | ICD-10-CM | POA: Diagnosis not present

## 2017-10-17 DIAGNOSIS — M25561 Pain in right knee: Secondary | ICD-10-CM | POA: Diagnosis not present

## 2017-10-21 DIAGNOSIS — M25661 Stiffness of right knee, not elsewhere classified: Secondary | ICD-10-CM | POA: Diagnosis not present

## 2017-10-21 DIAGNOSIS — M25662 Stiffness of left knee, not elsewhere classified: Secondary | ICD-10-CM | POA: Diagnosis not present

## 2017-10-21 DIAGNOSIS — M25561 Pain in right knee: Secondary | ICD-10-CM | POA: Diagnosis not present

## 2017-10-21 DIAGNOSIS — M25562 Pain in left knee: Secondary | ICD-10-CM | POA: Diagnosis not present

## 2017-10-23 ENCOUNTER — Ambulatory Visit: Payer: Medicare Other | Attending: Family Medicine | Admitting: Physical Therapy

## 2017-10-23 ENCOUNTER — Telehealth: Payer: Self-pay | Admitting: Physical Therapy

## 2017-10-23 DIAGNOSIS — M25662 Stiffness of left knee, not elsewhere classified: Secondary | ICD-10-CM | POA: Diagnosis not present

## 2017-10-23 DIAGNOSIS — M25562 Pain in left knee: Secondary | ICD-10-CM | POA: Diagnosis not present

## 2017-10-23 DIAGNOSIS — M25561 Pain in right knee: Secondary | ICD-10-CM | POA: Diagnosis not present

## 2017-10-23 DIAGNOSIS — M25661 Stiffness of right knee, not elsewhere classified: Secondary | ICD-10-CM | POA: Diagnosis not present

## 2017-10-23 NOTE — Telephone Encounter (Signed)
Pt no show for PT appointment today. They where contacted and informed of this. They where given number to call front office to reschedule although she seemed very confused, she said she did not have appointment today and then said she did not need to reschedule because she already had appointment.  Elsie Ra, PT, DPT 10/23/17 2:15 PM

## 2017-10-27 DIAGNOSIS — M25562 Pain in left knee: Secondary | ICD-10-CM | POA: Diagnosis not present

## 2017-10-27 DIAGNOSIS — M25561 Pain in right knee: Secondary | ICD-10-CM | POA: Diagnosis not present

## 2017-10-27 DIAGNOSIS — M25662 Stiffness of left knee, not elsewhere classified: Secondary | ICD-10-CM | POA: Diagnosis not present

## 2017-10-27 DIAGNOSIS — M25661 Stiffness of right knee, not elsewhere classified: Secondary | ICD-10-CM | POA: Diagnosis not present

## 2017-10-28 ENCOUNTER — Ambulatory Visit (INDEPENDENT_AMBULATORY_CARE_PROVIDER_SITE_OTHER): Payer: Medicare Other

## 2017-10-28 ENCOUNTER — Ambulatory Visit (INDEPENDENT_AMBULATORY_CARE_PROVIDER_SITE_OTHER): Payer: Medicare Other | Admitting: Orthopaedic Surgery

## 2017-10-28 ENCOUNTER — Encounter (INDEPENDENT_AMBULATORY_CARE_PROVIDER_SITE_OTHER): Payer: Self-pay | Admitting: Orthopaedic Surgery

## 2017-10-28 DIAGNOSIS — M1712 Unilateral primary osteoarthritis, left knee: Secondary | ICD-10-CM

## 2017-10-28 DIAGNOSIS — Z96651 Presence of right artificial knee joint: Secondary | ICD-10-CM | POA: Diagnosis not present

## 2017-10-28 NOTE — Progress Notes (Signed)
Office Visit Note   Patient: Ariel Harris           Date of Birth: 04-15-1945           MRN: [id number] Visit Date: 10/28/2017              Requested by: Antonietta Jewel, MD [address] [place], [place] [address] PCP: Antonietta Jewel, MD   Assessment & Plan: Visit Diagnoses:  1. S/P TKR (total knee replacement), right   2. Unilateral primary osteoarthritis, left knee     Plan: Impression is status post right total knee replacement doing well.  #2 right lower leg wound.  #3 left knee primary localized osteoarthritis.  In regards to the right knee, I have extended her physical therapy for another 6 weeks.  A new prescription was given to the patient for this.  In regards to the right lower leg wound, she will continue with mupirocin dressing changes daily.  Follow-up with [place] in 6 weeks for recheck.  In regards to the left knee, she would like to try range of motion and strengthening exercises to see if this would help over the next few weeks, if she has no relief she would like to proceed with total knee replacement.  She will follow-up with [place] in 6 weeks time for recheck. Total face to face encounter time was greater than 25 minutes and over half of this time was spent in counseling and/or coordination of care.   Follow-Up Instructions: Return in about 6 weeks (around 12/09/2017).   Orders:  Orders Placed This Encounter  Procedures  . XR Knee 1-2 Views Right  . XR Knee Complete 4 Views Left   No orders of the defined types were placed in this encounter.     Procedures: No procedures performed   Clinical Data: No additional findings.   Subjective: Chief Complaint  Patient presents with  . Right Knee - Pain  . Left Knee - Pain    HPI patient is a pleasant 72 year old [place] speaking female who presents to our clinic today for follow-up of her right knee, right lower leg wound and continued left knee pain.  In regards to the right knee, she is 6 months status post  right total knee replacement.  Doing fairly well but did not progress in physical therapy as she went to [place] for an extended period of time.  She has since returned to physical therapy where she is starting to make some progress.  Minimal pain.  In regards to her right lower leg wound, she has been applying mupirocin nightly for this.  Still admits to slight serosanguineous drainage but the size and amount of drainage has decreased over the past few weeks.  In regards to the left knee, history of osteoarthritis there.  Has become more and more symptomatic over the past several weeks.  She had a cortisone injection a few weeks back which was of no relief.  At this point, she would like to proceed with definitive treatment of a total knee replacement in the near future.  She does admit that she would like to try an exercise her left leg prior to this.  Review of Systems as detailed in HPI.  All others reviewed and are negative.   Objective: Vital Signs: There were no vitals taken for this visit.  Physical Exam well-developed well-nourished female in no acute distress.  Alert and oriented x3.  Ortho Exam examination of her right knee reveals a  well-healed surgical incision without evidence of infection.  She does have about a 5 degree flexion contracture.  She can flex the knee to about 95 degrees.  She is stable to valgus varus stress.  Her right lower extremity wound is to the distal third of the tibia and is approximately 0.5 x 0.5 cm.  There is mild serosanguineous drainage on her bandage.  No signs of infection.  Left knee shows a varus deformity.  Range of motion 0 to 100 degrees.  Moderate medial joint line tenderness.  Moderate patellofemoral crepitus.  She is neurovascularly intact distally.  Specialty Comments:  No specialty comments available.  Imaging: Xr Knee Complete 4 Views Left  Result Date: 10/28/2017 X-rays of the left knee reveal marked tract departmental degenerative  changes  Xr Knee 1-2 Views Right  Result Date: 10/28/2017 X-rays of the right knee reveal well-seated prosthesis without evidence of subsidence or ostial lysis    PMFS History: Patient Active Problem List   Diagnosis Date Noted  . Unilateral primary osteoarthritis, left knee 10/28/2017  . Hyperlipidemia 05/25/2017  . Osteoporosis 05/25/2017  . S/P TKR (total knee replacement), right 05/16/2017  . Unilateral primary osteoarthritis, right knee 11/01/2016  . History of PSVT 01/13/2014  . Chest pain 03/28/2011  . Palpitations 03/12/2011  . Dyslipidemia 01/12/2010  . Hypertension 01/12/2010  . CHEST PAIN, ATYPICAL 01/12/2010   Past Medical History:  Diagnosis Date  . Arthritis    "knees" (05/16/2017)  . Chest pain   . Hyperlipidemia   . Hypertension   . PAC (premature atrial contraction)   . SVT (supraventricular tachycardia) (HCC)     Family History  Problem Relation Age of Onset  . Coronary artery disease Neg Hx   . Heart attack Neg Hx   . Breast cancer Neg Hx     Past Surgical History:  Procedure Laterality Date  . JOINT REPLACEMENT    . KNEE ARTHROSCOPY Left 1996  . KNEE SURGERY Right [phone]   "open surgery; because of the pain"  . 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 TOTAL KNEE ARTHROPLASTY Right 05/16/2017   Procedure: RIGHT TOTAL KNEE ARTHROPLASTY;  Surgeon: Leandrew Koyanagi, MD;  Location: [facility];  Service: Orthopedics;  Laterality: Right;   Social History   Occupational History  . Not on file  Tobacco Use  . Smoking status: Never Smoker  . Smokeless tobacco: Never Used  Substance and Sexual Activity  . Alcohol use: No  . Drug use: No  . Sexual activity: Not Currently

## 2017-10-30 ENCOUNTER — Telehealth (INDEPENDENT_AMBULATORY_CARE_PROVIDER_SITE_OTHER): Payer: Self-pay | Admitting: Orthopaedic Surgery

## 2017-10-30 NOTE — Telephone Encounter (Signed)
Patient's granddaughter Raquel Sarna) called asking if the Rx for the cream or ointment was sent to the pharmacy. Raquel Sarna said patient did not get the cream or ointment after her appointment. The number to contact Raquel Sarna is [phone]

## 2017-10-31 DIAGNOSIS — M25562 Pain in left knee: Secondary | ICD-10-CM | POA: Diagnosis not present

## 2017-10-31 DIAGNOSIS — M25662 Stiffness of left knee, not elsewhere classified: Secondary | ICD-10-CM | POA: Diagnosis not present

## 2017-10-31 DIAGNOSIS — M25661 Stiffness of right knee, not elsewhere classified: Secondary | ICD-10-CM | POA: Diagnosis not present

## 2017-10-31 DIAGNOSIS — M25561 Pain in right knee: Secondary | ICD-10-CM | POA: Diagnosis not present

## 2017-10-31 MED ORDER — DICLOFENAC SODIUM 1 % TD GEL: 2.0000 g | Freq: Four times a day (QID) | TRANSDERMAL | 2 refills | Status: DC

## 2017-10-31 NOTE — Telephone Encounter (Signed)
I just resent voltaren.  I had told her to continue mupirocin.  Maybe she is out and this is what she is talking about?  If this is what she is talking about, can you resend?

## 2017-10-31 NOTE — Telephone Encounter (Signed)
What ointment/cream? Or was it for the Voltaren gel ?

## 2017-10-31 NOTE — Telephone Encounter (Signed)
Called to advise.  

## 2017-11-05 ENCOUNTER — Encounter: Payer: Self-pay | Admitting: Physician Assistant

## 2017-11-05 ENCOUNTER — Ambulatory Visit (INDEPENDENT_AMBULATORY_CARE_PROVIDER_SITE_OTHER): Payer: Medicare Other | Admitting: Physician Assistant

## 2017-11-05 ENCOUNTER — Other Ambulatory Visit: Payer: Self-pay

## 2017-11-05 VITALS — BP 113/71 | HR 64 | Temp 98.0°F | Resp 16 | Ht 62.0 in | Wt 162.6 lb

## 2017-11-05 DIAGNOSIS — L03119 Cellulitis of unspecified part of limb: Secondary | ICD-10-CM

## 2017-11-05 DIAGNOSIS — R21 Rash and other nonspecific skin eruption: Secondary | ICD-10-CM | POA: Diagnosis not present

## 2017-11-05 DIAGNOSIS — L02419 Cutaneous abscess of limb, unspecified: Secondary | ICD-10-CM | POA: Diagnosis not present

## 2017-11-05 MED ORDER — AMOXICILLIN-POT CLAVULANATE 875-125 MG PO TABS: 1.0000 | ORAL_TABLET | Freq: Two times a day (BID) | ORAL | 0 refills | Status: DC

## 2017-11-05 MED ORDER — PERMETHRIN 5 % EX CREA: 1.0000 "application " | TOPICAL_CREAM | Freq: Once | CUTANEOUS | 0 refills | Status: AC
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
## 2017-11-05 NOTE — Patient Instructions (Addendum)
1) B?n ph?i dng thu?c ny chnh xc nh? h??ng d?n ni Perkin?c nhi?m trng c?a b?n s? khng tr? nn t?t h?n. ??ng ng?ng u?ng thu?c tr? khi ti ni v?i b?n Rojero?c tr? khi b?n h?t. B?t ??u dng Augmentin: u?ng m?t vin hai l?n m?i ngy trong 10 ngy ti?p theo.  2) ??i v?i pht ban trn d? dy c?a b?n v tr? l?i: tri?t ?? massage Permethrin 5% kem t? ??u ??n ?? chn vo ban ?m tr??c khi ?i ng?; ?? l?i cho 8-14 gi? tr??c khi tho (vi hoa sen [place]?c t?m); m?t ?ng d?ng ni chung l ch?a b?nh   1) You must take this medication exactly as the directions say or your infection will not get better. Do not stop taking the pills unless I tell you to or unless you run out.     Start taking Augmentin: take one pill twice daily for the next 10 days.  2) For the rash on your stomach and back: Thoroughly massage Permethrin 5% cream from head to soles of feet at night before bed; leave on for 8 - 14 hours before removing (shower or bath); one application is generally curative   Thank you for coming in today. I hope you feel we met your needs.  Feel free to call PCP if you have any questions or further requests.  Please consider signing up for MyChart if you do not already have it, as this is a great way to communicate with me.  Best,  Whitney McVey, PA-C  IF you received an x-ray today, you will receive an invoice from [facility]. Please contact [facility] at [phone] with questions or concerns regarding your invoice.   IF you received labwork today, you will receive an invoice from [facility]. Please contact [facility] at [phone] with questions or concerns regarding your invoice.   Our billing staff will not be able to assist you with questions regarding bills from these companies.  You will be contacted with the lab results as soon as they are available. The fastest way to get your results is to activate your My Chart account. Instructions are located on the last page of this paperwork.  If you have not heard from [place] regarding the results in 2 weeks, please contact this office.
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
## 2017-11-06 ENCOUNTER — Ambulatory Visit (INDEPENDENT_AMBULATORY_CARE_PROVIDER_SITE_OTHER): Payer: Medicare Other | Admitting: Physician Assistant

## 2017-11-06 ENCOUNTER — Encounter: Payer: Self-pay | Admitting: Physician Assistant

## 2017-11-06 VITALS — BP 114/72 | HR 58 | Ht 62.0 in | Wt 163.0 lb

## 2017-11-06 DIAGNOSIS — Z87898 Personal history of other specified conditions: Secondary | ICD-10-CM | POA: Diagnosis not present

## 2017-11-06 DIAGNOSIS — I471 Supraventricular tachycardia: Secondary | ICD-10-CM

## 2017-11-06 DIAGNOSIS — I10 Essential (primary) hypertension: Secondary | ICD-10-CM | POA: Diagnosis not present

## 2017-11-06 DIAGNOSIS — E785 Hyperlipidemia, unspecified: Secondary | ICD-10-CM | POA: Diagnosis not present

## 2017-11-06 DIAGNOSIS — I491 Atrial premature depolarization: Secondary | ICD-10-CM | POA: Diagnosis not present

## 2017-11-06 MED ORDER — METOPROLOL SUCCINATE ER 25 MG PO TB24: ORAL_TABLET | ORAL | 3 refills | Status: DC

## 2017-11-06 NOTE — Patient Instructions (Signed)
Medication Instructions:  Your physician has recommended you make the following change in your medication:  1.  STOP the Lopressor 2.  START Toprol XL 25 mg taking 1 tablet daily.  May take 1 extra tablet daily ONLY AS NEEDED FOR PALPITATIONS  If you need a refill on your cardiac medications before your next appointment, please call your pharmacy.   Lab work: None ordered  If you have labs (blood work) drawn today and your tests are completely normal, you will receive your results only by: Marland Kitchen MyChart Message (if you have MyChart) OR . A paper copy in the mail If you have any lab test that is abnormal or we need to change your treatment, we will call you to review the results.  Testing/Procedures: None ordered  Follow-Up: At [facility], you and your health needs are our priority.  As part of our continuing mission to provide you with exceptional heart care, we have created designated Provider Care Teams.  These Care Teams include your primary Cardiologist (physician) and Advanced Practice Providers (APPs -  Physician Assistants and Nurse Practitioners) who all work together to provide you with the care you need, when you need it. You will need a follow up appointment in 1 years.  Please call our office 2 months in advance to schedule this appointment.  You may see Lauree Chandler, MD or one of the following Advanced Practice Providers on your designated Care Team:   [place], PA-C Melina Copa, PA-C . Ermalinda Barrios, PA-C  Any Other Special Instructions Will Be Listed Below (If Applicable).

## 2017-11-07 LAB — WOUND CULTURE: Organism ID, Bacteria: NONE SEEN

## 2017-11-10 DIAGNOSIS — M25662 Stiffness of left knee, not elsewhere classified: Secondary | ICD-10-CM | POA: Diagnosis not present

## 2017-11-10 DIAGNOSIS — M25561 Pain in right knee: Secondary | ICD-10-CM | POA: Diagnosis not present

## 2017-11-10 DIAGNOSIS — M25562 Pain in left knee: Secondary | ICD-10-CM | POA: Diagnosis not present

## 2017-11-10 DIAGNOSIS — M25661 Stiffness of right knee, not elsewhere classified: Secondary | ICD-10-CM | POA: Diagnosis not present

## 2017-11-11 DIAGNOSIS — M25662 Stiffness of left knee, not elsewhere classified: Secondary | ICD-10-CM | POA: Diagnosis not present

## 2017-11-11 DIAGNOSIS — M25661 Stiffness of right knee, not elsewhere classified: Secondary | ICD-10-CM | POA: Diagnosis not present

## 2017-11-11 DIAGNOSIS — M25562 Pain in left knee: Secondary | ICD-10-CM | POA: Diagnosis not present

## 2017-11-11 DIAGNOSIS — M25561 Pain in right knee: Secondary | ICD-10-CM | POA: Diagnosis not present

## 2017-11-13 ENCOUNTER — Other Ambulatory Visit: Payer: Self-pay

## 2017-11-13 ENCOUNTER — Encounter: Payer: Self-pay | Admitting: Physician Assistant

## 2017-11-13 ENCOUNTER — Ambulatory Visit (INDEPENDENT_AMBULATORY_CARE_PROVIDER_SITE_OTHER): Payer: Medicare Other | Admitting: Physician Assistant

## 2017-11-13 VITALS — BP 124/75 | HR 71 | Temp 97.7°F | Resp 16 | Ht 62.0 in | Wt 161.8 lb

## 2017-11-13 DIAGNOSIS — L02419 Cutaneous abscess of limb, unspecified: Secondary | ICD-10-CM | POA: Diagnosis not present

## 2017-11-13 DIAGNOSIS — R21 Rash and other nonspecific skin eruption: Secondary | ICD-10-CM | POA: Diagnosis not present

## 2017-11-13 DIAGNOSIS — L03119 Cellulitis of unspecified part of limb: Secondary | ICD-10-CM | POA: Diagnosis not present

## 2017-11-13 MED ORDER — PERMETHRIN 5 % EX CREA: 1.0000 "application " | TOPICAL_CREAM | Freq: Once | CUTANEOUS | 0 refills | Status: AC

## 2017-11-13 MED ORDER — AMOXICILLIN-POT CLAVULANATE 875-125 MG PO TABS: 1.0000 | ORAL_TABLET | Freq: Two times a day (BID) | ORAL | 0 refills | Status: DC

## 2017-11-13 NOTE — Patient Instructions (Addendum)
B?n ?ang lm m?t cng vi?c tuy?t v?i l u?ng thu?c khng sinh theo ch? d?n. Ti?p t?c dng khng sinh hai l?n m?i ngy. Ti ?ang vi?t cho b?n m?t ??n thu?c ?? c thm 4 ngy dng khng sinh. V v?y, m?t khi b?n hon thnh chai ban ??u c?a b?n, b?n c?n b?t ??u chai th? hai. ?y s? l t?ng c?ng 14 ngy ?i?u tr? b?ng khng sinh.  b?n c th? s? d?ng thu?c bi ngoi da n?u b?n mu?n, nh?ng ? l thu?c khng sinh ?ang ch?a lnh chn c?a b?n.  Hy quay l?i v g?p ti vo tu?n t?i ?? ki?m tra l?i chn c?a b?n.  ??i v?i pht ban c?a b?n: B?nh gh? l m?t s? ph Farver?i c?a da b?i m?t con ve g?i l Sarcoptes scabiei. N gy ng?a d? d?i v c th? ly t? ng??i ny sang ng??i khc thng qua ti?p xc da k? da.  ?i?u tr? cc thnh vin trong gia ?nh - Trong m?t s? tr??ng h?p, cc thnh vin trong gia ?nh v nh?ng ng??i ti?p xc g?n g?i v?i ng??i c tri?u ch?ng c?n ?i?u tr? b?nh gh?, ngay c? khi khng c tri?u ch?ng, ?? trnh chu k? nhi?m trng l?p l?i. M?t nh cung c?p ch?m [place] s?c kh?e c th? gip quy?t ??nh n?u ?i?u ny l c?n thi?t, ty thu?c vo tnh hu?ng c nhn.  M?c d b?nh gh? th??ng t ly lan h?n khi ch?m vo qu?n o Valenti?c ga tr?i gi??ng c?a ng??i b? nhi?m b?nh, nh?ng v?n nn gi?t Milliner?c cch ly b?t k? qu?n o, kh?n tr?i gi??ng, kh?n t?m, ?? ng?, ?? lt Schuelke?c th nh?i bng m ng??i ? ? ch?m vo trong ba ngy tr??c ? s? ??i x?. N th??ng khng c?n thi?t ?? r?a cc m?t hng khc. Cc l?a ch?n h?p l ?? lo?i b? b? ve kh?i nh?ng v?t d?ng ny bao g?m ??t chng trong ti nh?a t nh?t ba ngy, gi?t b?ng my v sau ? ?i Hevener?c s?y kh trong my s?y ?i?n ? ch? ?? nng Potteiger?c gi?t kh.  B?nh gh? th??ng ???c truy?n t? ng??i ny sang ng??i khc thng qua ti?p xc da k? da. Thng th??ng cc b?c cha m? b? nhi?m b?nh truy?n b?nh gh? cho con c?a h? (??c bi?t l tr? s? sinh) Dettore?c ng??c l?i do ti?p xc g?n g?i. Tuy nhin, th?t b?t th??ng khi tr? ?i h?c truy?n b?nh gh? cho nhau.  M?t kho?ng ba ??n b?n tu?n ?? cc d?u hi?u Lichty?c tri?u  ch?ng c?a nhi?m trng gh? ??u tin pht tri?n sau khi b? nhi?m trng. Nh?ng ng??i ? b? nhi?m b?nh gh? tr??c ?y c th? xu?t hi?n cc tri?u ch?ng trong vng m?t vi ngy sau khi ti?p xc khc.  ------------------------------------------------------------------------------------------------------- You are doing a great job of taking the antibiotics as directed. Continue taking the antibiotics twice daily.  I am writing you a prescription for an additional 4 days of antibiotics. So, once you finish your original bottle you need to start the second bottle.  This will be a total of 14 days of antibiotic treatment.   Come back and see me next week to recheck your leg.   For your rash:  Scabies is an infestation of the skin by a mite called Sarcoptes scabiei. It causes intense itching and can be spread from one person to another through close skin-to-skin contact.    Treat family members-In some cases, household members and close contacts of a  person with symptoms need treatment for scabies, even if there are no symptoms, to avoid a repeating cycle of infection. A healthcare provider can help to decide if this is necessary, depending upon the individual situation. Although scabies is less frequently spread by touching the clothing or bedsheets of an infected person, it is still a good idea to wash or isolate any clothing, bedding, towels, pajamas, underwear, or stuffed animals that the person has touched within three days before treatment. It is not usually necessary to wash other items. Reasonable options for eliminating mites from these items include placing them in plastic bags for at least three days, machine washing and then ironing or drying in an electric dryer on the hot setting, or dry cleaning.  Scabies is usually passed from one person to another through close skin-to-skin contact. It is common for infected parents to pass scabies to their child (particularly an infant) or vice versa as a  result of close contact. However, it is unusual for school children to pass scabies to each other. It takes about three to four weeks for signs or symptoms of a first scabies infection to develop after infection. People who have been infected with scabies previously may develop symptoms within a few days after another exposure.   Thank you for coming in today. I hope you feel we met your needs.  Feel free to call PCP if you have any questions or further requests.  Please consider signing up for MyChart if you do not already have it, as this is a great way to communicate with me.  Best,  Whitney McVey, PA-C   IF you received an x-ray today, you will receive an invoice from [facility]. Please contact [facility] at [phone] with questions or concerns regarding your invoice.   IF you received labwork today, you will receive an invoice from [facility]. Please contact [facility] at [phone] with questions or concerns regarding your invoice.   Our billing staff will not be able to assist you with questions regarding bills from these companies.  You will be contacted with the lab results as soon as they are available. The fastest way to get your results is to activate your My Chart account. Instructions are located on the last page of this paperwork. If you have not heard from [place] regarding the results in 2 weeks, please contact this office.

## 2017-11-13 NOTE — Progress Notes (Signed)
Ariel Harris  MRN: [id number] DOB: [birth year]  PCP: Antonietta Jewel, MD  Subjective:  Pt is a 72 year old female who presents to clinic for f/u leg wound. She is here today with [place] interpreter.  She was here 10/9 c/o wound on lower leg x 3 months. Rx for augmentin. She has been taking this as prescribed which is twice daily.  She is feeling better. Endorses itching.   Her rash is better.    Review of Systems  Constitutional: Negative for chills and fever.  Skin: Positive for wound.    Patient Active Problem List   Diagnosis Date Noted  . Unilateral primary osteoarthritis, left knee 10/28/2017  . Hyperlipidemia 05/25/2017  . Osteoporosis 05/25/2017  . S/P TKR (total knee replacement), right 05/16/2017  . Unilateral primary osteoarthritis, right knee 11/01/2016  . History of PSVT 01/13/2014  . Chest pain 03/28/2011  . Palpitations 03/12/2011  . Dyslipidemia 01/12/2010  . Hypertension 01/12/2010  . CHEST PAIN, ATYPICAL 01/12/2010    Current Outpatient Medications on File Prior to Visit  Medication Sig Dispense Refill  . amoxicillin-clavulanate (AUGMENTIN) 875-125 MG tablet Take 1 tablet by mouth 2 (two) times daily. 20 tablet 0  . metoprolol succinate (TOPROL XL) 25 MG 24 hr tablet Take 1 tablet by mouth daily, may take 1 extra tablet by mouth daily only as needed for palpitations 150 tablet 3  . pravastatin (PRAVACHOL) 40 MG tablet Take 1 tablet (40 mg total) by mouth daily. 90 tablet 3  . alendronate (FOSAMAX) 70 MG tablet Take 70 mg by mouth once a week. Take with a full glass of water on an empty stomach. (Every Tuesday)     Current Facility-Administered Medications on File Prior to Visit  Medication Dose Route Frequency Provider Last Rate Last Dose  . methylPREDNISolone acetate (DEPO-MEDROL) injection 40 mg  40 mg Intra-articular Once Hilts, Legrand Como, MD        Allergies  Allergen Reactions  . Indomethacin Swelling  . Tramadol     Sweaty, made feel bad     Objective:  BP 124/75 (BP Location: Right Arm, Patient Position: Sitting, Cuff Size: Normal)   Pulse 71   Temp 97.7 F (36.5 C) (Oral)   Resp 16   Ht 5\' 2"  (1.575 m)   Wt 161 lb 12.8 oz (73.4 kg)   SpO2 94%   BMI 29.59 kg/m   Physical Exam  Constitutional: She appears well-developed and well-nourished.  Skin: Skin is warm and dry. Lesion (no drainage. ) noted.  Psychiatric: She has a normal mood and affect. Her behavior is normal. Judgment and thought content normal.  Vitals reviewed.     Assessment and Plan :  1. Cellulitis and abscess of leg - Pt here for f/u leg wound. She is clinically improving. Will add 4 more days of Augmentin. RTC in 1 week to recheck.  - amoxicillin-clavulanate (AUGMENTIN) 875-125 MG tablet; Take 1 tablet by mouth 2 (two) times daily.  Dispense: 8 tablet; Refill: 0  2. Rash and nonspecific skin eruption - Suspect scabies. discussed home care. Reapply if needed.  - permethrin (ELIMITE) 5 % cream; Apply 1 application topically once for 1 dose.  Dispense: 60 g; Refill: 0   Whitney Danai Gotto, PA-C  [facility] [place] 11/13/2017 12:06 PM  Please note: Portions of this report may have been transcribed using dragon voice recognition software. Every effort was made to ensure accuracy; however, inadvertent computerized transcription errors may be present.

## 2017-11-17 DIAGNOSIS — M25661 Stiffness of right knee, not elsewhere classified: Secondary | ICD-10-CM | POA: Diagnosis not present

## 2017-11-17 DIAGNOSIS — M25662 Stiffness of left knee, not elsewhere classified: Secondary | ICD-10-CM | POA: Diagnosis not present

## 2017-11-17 DIAGNOSIS — M25561 Pain in right knee: Secondary | ICD-10-CM | POA: Diagnosis not present

## 2017-11-17 DIAGNOSIS — M25562 Pain in left knee: Secondary | ICD-10-CM | POA: Diagnosis not present

## 2017-11-18 ENCOUNTER — Other Ambulatory Visit: Payer: Self-pay

## 2017-11-18 ENCOUNTER — Ambulatory Visit (INDEPENDENT_AMBULATORY_CARE_PROVIDER_SITE_OTHER): Payer: Medicare Other | Admitting: Physician Assistant

## 2017-11-18 ENCOUNTER — Encounter: Payer: Self-pay | Admitting: Physician Assistant

## 2017-11-18 VITALS — BP 100/64 | HR 74 | Temp 98.6°F | Resp 16 | Ht 62.0 in | Wt 163.2 lb

## 2017-11-18 DIAGNOSIS — L02419 Cutaneous abscess of limb, unspecified: Secondary | ICD-10-CM

## 2017-11-18 DIAGNOSIS — L03119 Cellulitis of unspecified part of limb: Secondary | ICD-10-CM | POA: Diagnosis not present

## 2017-11-18 MED ORDER — CEPHALEXIN 500 MG PO CAPS: 500.0000 mg | ORAL_CAPSULE | Freq: Four times a day (QID) | ORAL | 0 refills | Status: AC

## 2017-11-18 NOTE — Patient Instructions (Addendum)
ti ?ang thm m?t lo?i thu?c th? hai ?? ?i?u tr? chn c?a b?n Ti?p t?c dng Augmentin hai l?n m?i ngy. Ngoi Augmentin b?n c?ng c?n u?ng Keflex 500mg  b?n l?n m?t ngy. ?i?u ny c ngh?a l b?n s? dng hai lo?i thu?c khng sinh cng m?t lc  Hy quay l?i v g?p ti vo tu?n t?i.   Continue taking Augmentin twice daily. In addition to Augmentin you need to also take Keflex 500mg  four times a day. This means you will be taking two antibiotic medications at the same time.   Come back and see me next week.   IF you received an x-ray today, you will receive an invoice from [facility] Radiology. Please contact [facility]iology at [phone] with questions or concerns regarding your invoice.   IF you received labwork today, you will receive an invoice from [facility]. Please contact [facility] at [phone] with questions or concerns regarding your invoice.   Our billing staff will not be able to assist you with questions regarding bills from these companies.  You will be contacted with the lab results as soon as they are available. The fastest way to get your results is to activate your My Chart account. Instructions are located on the last page of this paperwork. If you have not heard from [place] regarding the results in 2 weeks, please contact this office.

## 2017-11-18 NOTE — Progress Notes (Signed)
Ariel Harris  MRN: [id number] DOB: 1945-03-26  PCP: Antonietta Jewel, MD  Subjective:  Pt is a 72 year old female who presents to clinic for f/u leg wound. She is here today with [place] interpreter.  She was here 10/9 c/o wound on lower leg x 3 months. Rx for augmentin. She has been taking this as prescribed which is twice daily. She is feeling some improvement.   Review of Systems  Constitutional: Negative for chills and fever.  Musculoskeletal: Negative for arthralgias and joint swelling.  Skin: Positive for wound.    Patient Active Problem List   Diagnosis Date Noted  . Unilateral primary osteoarthritis, left knee 10/28/2017  . Hyperlipidemia 05/25/2017  . Osteoporosis 05/25/2017  . S/P TKR (total knee replacement), right 05/16/2017  . Unilateral primary osteoarthritis, right knee 11/01/2016  . History of PSVT 01/13/2014  . Chest pain 03/28/2011  . Palpitations 03/12/2011  . Dyslipidemia 01/12/2010  . Hypertension 01/12/2010  . CHEST PAIN, ATYPICAL 01/12/2010    Current Outpatient Medications on File Prior to Visit  Medication Sig Dispense Refill  . alendronate (FOSAMAX) 70 MG tablet Take 70 mg by mouth once a week. Take with a full glass of water on an empty stomach. (Every Tuesday)    . amoxicillin-clavulanate (AUGMENTIN) 875-125 MG tablet Take 1 tablet by mouth 2 (two) times daily. 8 tablet 0  . metoprolol succinate (TOPROL XL) 25 MG 24 hr tablet Take 1 tablet by mouth daily, may take 1 extra tablet by mouth daily only as needed for palpitations 150 tablet 3  . pravastatin (PRAVACHOL) 40 MG tablet Take 1 tablet (40 mg total) by mouth daily. 90 tablet 3   Current Facility-Administered Medications on File Prior to Visit  Medication Dose Route Frequency Provider Last Rate Last Dose  . methylPREDNISolone acetate (DEPO-MEDROL) injection 40 mg  40 mg Intra-articular Once Hilts, Legrand Como, MD        Allergies  Allergen Reactions  . Indomethacin Swelling  . Tramadol    Sweaty, made feel bad     Objective:  BP 100/64 (BP Location: Left Arm, Patient Position: Sitting, Cuff Size: Normal)   Pulse 74   Temp 98.6 F (37 C) (Oral)   Resp 16   Ht 5\' 2"  (1.575 m)   Wt 163 lb 3.2 oz (74 kg)   SpO2 96%   BMI 29.85 kg/m   Physical Exam  Constitutional: She appears well-developed and well-nourished.  Skin: Skin is warm and dry. Lesion noted.     No drainage or warmth. Mild erythematous satellite macular lesions on the lateral periphery of her wound. No TTP. Wound edges marked with surgical pen.   Psychiatric: She has a normal mood and affect. Thought content normal.  Vitals reviewed.     Assessment and Plan :  1. Cellulitis and abscess of leg - Pt is not improving as quickly as I had hoped and there are erythematous macular lesions on the edges of her wound. Continue Augmentin for 4 more days and add Keflex. RTC next week for recheck.  - cephALEXin (KEFLEX) 500 MG capsule; Take 1 capsule (500 mg total) by mouth 4 (four) times daily for 7 days.  Dispense: 28 capsule; Refill: 0   Mercer Pod, PA-C  [facility] [place] 11/18/2017 12:29 PM  Please note: Portions of this report may have been transcribed using dragon voice recognition software. Every effort was made to ensure accuracy; however, inadvertent computerized transcription errors may be present.

## 2017-11-25 ENCOUNTER — Ambulatory Visit (INDEPENDENT_AMBULATORY_CARE_PROVIDER_SITE_OTHER): Payer: Medicare Other | Admitting: Family Medicine

## 2017-11-25 ENCOUNTER — Encounter: Payer: Self-pay | Admitting: Family Medicine

## 2017-11-25 VITALS — BP 133/71 | HR 68 | Temp 98.1°F | Ht 63.5 in | Wt 164.4 lb

## 2017-11-25 DIAGNOSIS — L02419 Cutaneous abscess of limb, unspecified: Secondary | ICD-10-CM

## 2017-11-25 DIAGNOSIS — L03119 Cellulitis of unspecified part of limb: Secondary | ICD-10-CM | POA: Diagnosis not present

## 2017-11-25 NOTE — Progress Notes (Signed)
Subjective:  By signing my name below, I, Moises Blood, attest that this documentation has been prepared under the direction and in the presence of Merri Ray, MD. Electronically Signed: Moises Blood, [place]. 11/25/2017 , 10:51 AM .  Patient was seen in Room 9 .   Patient ID: Ariel Harris, female    DOB: [birth year], 72 y.o.   MRN: [id number] Chief Complaint  Patient presents with  . Cellulitis and abscess of leg    3 months.  f/u    HPI Ariel Harris is a 72 y.o. female Here for follow up. She was most recently followed by [facility] Pod, with most recent visit on Oct 22nd. She had lower leg wound for 3 months present when seen in Oct 9th. She was started on Augmentin bid, which she indicated improvement. However on exam, she had some possible erythematous macular lesions at the edge of the wound. Keflex was added with additional 4 days of Augmentin.   She's been taking Keflex 500 mg qid. She denies any side effects with her medications. She has had itching around the area of wound, and can still feel a little swelling around the area. She has had improvement. She has 5 pills of Keflex left. She's been washing the area with soap with warm/hot water around 1:00 PM each day. She's also been elevating her leg up at night. She had knee surgery about 6-7 months ago.   Primary language is [place], interpreter present for translation.   Patient Active Problem List   Diagnosis Date Noted  . Unilateral primary osteoarthritis, left knee 10/28/2017  . Hyperlipidemia 05/25/2017  . Osteoporosis 05/25/2017  . S/P TKR (total knee replacement), right 05/16/2017  . Unilateral primary osteoarthritis, right knee 11/01/2016  . History of PSVT 01/13/2014  . Chest pain 03/28/2011  . Palpitations 03/12/2011  . Dyslipidemia 01/12/2010  . Hypertension 01/12/2010  . CHEST PAIN, ATYPICAL 01/12/2010   Past Medical History:  Diagnosis Date  . Arthritis    "knees" (05/16/2017)  . Chest pain     . Hyperlipidemia   . Hypertension   . PAC (premature atrial contraction)   . SVT (supraventricular tachycardia) (HCC)    Past Surgical History:  Procedure Laterality Date  . JOINT REPLACEMENT    . KNEE ARTHROSCOPY Left 1996  . KNEE SURGERY Right [phone]   "open surgery; because of the pain"  . 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 TOTAL KNEE ARTHROPLASTY Right 05/16/2017   Procedure: RIGHT TOTAL KNEE ARTHROPLASTY;  Surgeon: Leandrew Koyanagi, MD;  Location: [facility];  Service: Orthopedics;  Laterality: Right;   Allergies  Allergen Reactions  . Indomethacin Swelling  . Tramadol     Sweaty, made feel bad   Prior to Admission medications   Medication Sig Start Date End Date Taking? Authorizing Provider  alendronate (FOSAMAX) 70 MG tablet Take 70 mg by mouth once a week. Take with a full glass of water on an empty stomach. (Every Tuesday)    [provider]  amoxicillin-clavulanate (AUGMENTIN) 875-125 MG tablet Take 1 tablet by mouth 2 (two) times daily. 11/13/17   McVey, [name] Mink, PA-C  cephALEXin (KEFLEX) 500 MG capsule Take 1 capsule (500 mg total) by mouth 4 (four) times daily for 7 days. 11/18/17 11/25/17  McVey, [name] Mink, PA-C  metoprolol succinate (TOPROL XL) 25 MG 24 hr tablet Take 1 tablet by mouth daily, may take 1 extra tablet by mouth daily only as needed for palpitations 11/06/17  Dunn, Dayna N, PA-C  pravastatin (PRAVACHOL) 40 MG tablet Take 1 tablet (40 mg total) by mouth daily. 07/08/17   McVey, [name] Mink, PA-C   Social History   Socioeconomic History  . Marital status: Divorced    Spouse name: Not on file  . Number of children: 2  . Years of education: Not on file  . Highest education level: Not on file  Occupational History  . Not on file  Social Needs  . Financial resource strain: Not on file  . Food insecurity:    Worry: Not on file    Inability: Not on file  . Transportation needs:    Medical: Not on file     Non-medical: Not on file  Tobacco Use  . Smoking status: Never Smoker  . Smokeless tobacco: Never Used  Substance and Sexual Activity  . Alcohol use: No  . Drug use: No  . Sexual activity: Not Currently  Lifestyle  . Physical activity:    Days per week: Not on file    Minutes per session: Not on file  . Stress: Not on file  Relationships  . Social connections:    Talks on phone: Not on file    Gets together: Not on file    Attends religious service: Not on file    Active member of club or organization: Not on file    Attends meetings of clubs or organizations: Not on file    Relationship status: Not on file  . Intimate partner violence:    Fear of current or ex partner: Not on file    Emotionally abused: Not on file    Physically abused: Not on file    Forced sexual activity: Not on file  Other Topics Concern  . Not on file  Social History Narrative   Negative family `history--Specifically artery disease or MI. The patient has two siblings died at a young age from fevers.   The patient is separated and single. She is retired but used to work at a [employer] and a [employer]. She is independent of daily living. She has never drink or smoke or done drugs   Review of Systems  Constitutional: Negative for chills, fatigue, fever and unexpected weight change.  Respiratory: Negative for cough.   Gastrointestinal: Negative for constipation, diarrhea, nausea and vomiting.  Skin: Positive for wound. Negative for rash.  Neurological: Negative for dizziness, weakness and headaches.       Objective:   Physical Exam  Constitutional: She is oriented to person, place, and time. She appears well-developed and well-nourished. No distress.  HENT:  Head: Normocephalic and atraumatic.  Eyes: Pupils are equal, round, and reactive to light. EOM are normal.  Neck: Neck supple.  Cardiovascular: Normal rate.  Pulses:      Dorsalis pedis pulses are 2+ on the right side.  Pulmonary/Chest: Effort  normal. No respiratory distress.  Musculoskeletal: Normal range of motion.  Neurological: She is alert and oriented to person, place, and time.  Skin: Skin is warm and dry.  Right leg: similar appearing area of involvement with faded and receding erythema, small central callus, trace to 1+ pedal edema, no stasis changes, dryness to skin but no exudate; DP 2+ with warm toes in right foot  Psychiatric: She has a normal mood and affect. Her behavior is normal.  Nursing note and vitals reviewed.   Vitals:   11/25/17 1009  BP: 133/71  Pulse: 68  Temp: 98.1 F (36.7 C)  TempSrc: Oral  SpO2:  94%  Weight: 164 lb 6.4 oz (74.6 kg)  Height: 5' 3.5" (1.613 m)       Assessment & Plan:    Ariel Harris is a 72 y.o. female Cellulitis and abscess of leg  -Appears to be improving from visit last week.  Has 1 more day of antibiotics.  At this point do not think that further antibiotics will be necessary, but RTC precautions were given if any increased swelling, redness, warmth, or other worsening.  -Elevate leg when seated to help with edema.   -Wound care discussed with soap and water cleansing, hydrating lotion such as Eucerin or Aveeno as needed.  -Recheck 1 week, sooner if worse  No orders of the defined types were placed in this encounter.  Patient Instructions   Ok to continue keflex until finished. Elevate legs when seated to lessen swelling.  Cleanse area with mild soap and water once per day, and after drying apply lotion such as Eucerin or Aveeno.  You can apply lotion to that area as needed throughout the day (3-4 times if needed).   Recheck in 1 week with Whitney, but if any increased redness, spread of rash, increased warmth or swelling, or other worsening symptoms return sooner.   Cellulitis, Adult Cellulitis is a skin infection. The infected area is usually red and tender. This condition occurs most often in the arms and lower legs. The infection can travel to the muscles,  blood, and underlying tissue and become serious. It is very important to get treated for this condition. What are the causes? Cellulitis is caused by bacteria. The bacteria enter through a break in the skin, such as a cut, burn, insect bite, open sore, or crack. What increases the risk? This condition is more likely to occur in people who:  Have a weak defense system (immune system).  Have open wounds on the skin such as cuts, burns, bites, and scrapes. Bacteria can enter the body through these open wounds.  Are older.  Have diabetes.  Have a type of long-lasting (chronic) liver disease (cirrhosis) or kidney disease.  Use IV drugs.  What are the signs or symptoms? Symptoms of this condition include:  Redness, streaking, or spotting on the skin.  Swollen area of the skin.  Tenderness or pain when an area of the skin is touched.  Warm skin.  Fever.  Chills.  Blisters.  How is this diagnosed? This condition is diagnosed based on a medical history and physical exam. You may also have tests, including:  Blood tests.  Lab tests.  Imaging tests.  How is this treated? Treatment for this condition may include:  Medicines, such as antibiotic medicines or antihistamines.  Supportive care, such as rest and application of cold or warm cloths (cold or warm compresses) to the skin.  Hospital care, if the condition is severe.  The infection usually gets better within 1-2 days of treatment. Follow these instructions at home:  Take over-the-counter and prescription medicines only as told by your health care provider.  If you were prescribed an antibiotic medicine, take it as told by your health care provider. Do not stop taking the antibiotic even if you start to feel better.  Drink enough fluid to keep your urine clear or pale yellow.  Do not touch or rub the infected area.  Raise (elevate) the infected area above the level of your heart while you are sitting or lying  down.  Apply warm or cold compresses to the affected area  as told by your health care provider.  Keep all follow-up visits as told by your health care provider. This is important. These visits let your health care provider make sure a more serious infection is not developing. Contact a health care provider if:  You have a fever.  Your symptoms do not improve within 1-2 days of starting treatment.  Your bone or joint underneath the infected area becomes painful after the skin has healed.  Your infection returns in the same area or another area.  You notice a swollen bump in the infected area.  You develop new symptoms.  You have a general ill feeling (malaise) with muscle aches and pains. Get help right away if:  Your symptoms get worse.  You feel very sleepy.  You develop vomiting or diarrhea that persists.  You notice red streaks coming from the infected area.  Your red area gets larger or turns dark in color. This information is not intended to replace advice given to you by your health care provider. Make sure you discuss any questions you have with your health care provider. Document Released: 10/24/2004 Document Revised: 05/25/2015 Document Reviewed: 11/23/2014 Elsevier Interactive Patient Education  [employer].   If you have lab work done today you will be contacted with your lab results within the next 2 weeks.  If you have not heard from [place] then please contact us. The fastest way to get your results is to register for My Chart.   IF you received an x-ray today, you will receive an invoice from [facility]. Please contact [facility] at [phone] with questions or concerns regarding your invoice.   IF you received labwork today, you will receive an invoice from [facility]. Please contact [facility] at [phone] with questions or concerns regarding your invoice.   Our billing staff will not be able to assist you with questions regarding  bills from these companies.  You will be contacted with the lab results as soon as they are available. The fastest way to get your results is to activate your My Chart account. Instructions are located on the last page of this paperwork. If you have not heard from [place] regarding the results in 2 weeks, please contact this office.       I personally performed the services described in this documentation, which was scribed in my presence. The recorded information has been reviewed and considered for accuracy and completeness, addended by me as needed, and agree with information above.  Signed,   Merri Ray, MD [facility].  11/25/17 10:53 AM

## 2017-11-25 NOTE — Patient Instructions (Addendum)
Ok to continue keflex until finished. Elevate legs when seated to lessen swelling.  Cleanse area with mild soap and water once per day, and after drying apply lotion such as Eucerin or Aveeno.  You can apply lotion to that area as needed throughout the day (3-4 times if needed).   Recheck in 1 week with Whitney, but if any increased redness, spread of rash, increased warmth or swelling, or other worsening symptoms return sooner.   Cellulitis, Adult Cellulitis is a skin infection. The infected area is usually red and tender. This condition occurs most often in the arms and lower legs. The infection can travel to the muscles, blood, and underlying tissue and become serious. It is very important to get treated for this condition. What are the causes? Cellulitis is caused by bacteria. The bacteria enter through a break in the skin, such as a cut, burn, insect bite, open sore, or crack. What increases the risk? This condition is more likely to occur in people who:  Have a weak defense system (immune system).  Have open wounds on the skin such as cuts, burns, bites, and scrapes. Bacteria can enter the body through these open wounds.  Are older.  Have diabetes.  Have a type of long-lasting (chronic) liver disease (cirrhosis) or kidney disease.  Use IV drugs.  What are the signs or symptoms? Symptoms of this condition include:  Redness, streaking, or spotting on the skin.  Swollen area of the skin.  Tenderness or pain when an area of the skin is touched.  Warm skin.  Fever.  Chills.  Blisters.  How is this diagnosed? This condition is diagnosed based on a medical history and physical exam. You may also have tests, including:  Blood tests.  Lab tests.  Imaging tests.  How is this treated? Treatment for this condition may include:  Medicines, such as antibiotic medicines or antihistamines.  Supportive care, such as rest and application of cold or warm cloths (cold or warm  compresses) to the skin.  Hospital care, if the condition is severe.  The infection usually gets better within 1-2 days of treatment. Follow these instructions at home:  Take over-the-counter and prescription medicines only as told by your health care provider.  If you were prescribed an antibiotic medicine, take it as told by your health care provider. Do not stop taking the antibiotic even if you start to feel better.  Drink enough fluid to keep your urine clear or pale yellow.  Do not touch or rub the infected area.  Raise (elevate) the infected area above the level of your heart while you are sitting or lying down.  Apply warm or cold compresses to the affected area as told by your health care provider.  Keep all follow-up visits as told by your health care provider. This is important. These visits let your health care provider make sure a more serious infection is not developing. Contact a health care provider if:  You have a fever.  Your symptoms do not improve within 1-2 days of starting treatment.  Your bone or joint underneath the infected area becomes painful after the skin has healed.  Your infection returns in the same area or another area.  You notice a swollen bump in the infected area.  You develop new symptoms.  You have a general ill feeling (malaise) with muscle aches and pains. Get help right away if:  Your symptoms get worse.  You feel very sleepy.  You develop vomiting or diarrhea  that persists.  You notice red streaks coming from the infected area.  Your red area gets larger or turns dark in color. This information is not intended to replace advice given to you by your health care provider. Make sure you discuss any questions you have with your health care provider. Document Released: 10/24/2004 Document Revised: 05/25/2015 Document Reviewed: 11/23/2014 Elsevier Interactive Patient Education  [employer].   If you have lab work done  today you will be contacted with your lab results within the next 2 weeks.  If you have not heard from [place] then please contact us. The fastest way to get your results is to register for My Chart.   IF you received an x-ray today, you will receive an invoice from [facility]. Please contact [facility] at [phone] with questions or concerns regarding your invoice.   IF you received labwork today, you will receive an invoice from [facility]. Please contact [facility] at [phone] with questions or concerns regarding your invoice.   Our billing staff will not be able to assist you with questions regarding bills from these companies.  You will be contacted with the lab results as soon as they are available. The fastest way to get your results is to activate your My Chart account. Instructions are located on the last page of this paperwork. If you have not heard from [place] regarding the results in 2 weeks, please contact this office.

## 2017-11-27 ENCOUNTER — Encounter

## 2017-12-04 ENCOUNTER — Ambulatory Visit (INDEPENDENT_AMBULATORY_CARE_PROVIDER_SITE_OTHER): Payer: Medicare Other | Admitting: Physician Assistant

## 2017-12-04 ENCOUNTER — Encounter: Payer: Self-pay | Admitting: Physician Assistant

## 2017-12-04 VITALS — BP 130/79 | HR 63 | Temp 98.1°F | Ht 63.5 in | Wt 164.0 lb

## 2017-12-04 DIAGNOSIS — Z5189 Encounter for other specified aftercare: Secondary | ICD-10-CM

## 2017-12-04 NOTE — Patient Instructions (Signed)
° ° ° °  If you have lab work done today you will be contacted with your lab results within the next 2 weeks.  If you have not heard from us then please contact us. The fastest way to get your results is to register for My Chart. ° ° °IF you received an x-ray today, you will receive an invoice from [facility]. Please contact [facility] at [phone] with questions or concerns regarding your invoice.  ° °IF you received labwork today, you will receive an invoice from [facility]. Please contact [facility] at [phone] with questions or concerns regarding your invoice.  ° °Our billing staff will not be able to assist you with questions regarding bills from these companies. ° °You will be contacted with the lab results as soon as they are available. The fastest way to get your results is to activate your My Chart account. Instructions are located on the last page of this paperwork. If you have not heard from us regarding the results in 2 weeks, please contact this office. °  ° ° ° °

## 2017-12-04 NOTE — Progress Notes (Signed)
   Ariel Harris  MRN: [id number] DOB: 17-Mar-1945  PCP: Antonietta Jewel, MD  Subjective:  Pt is a 72 year old female who presents to clinic for f/u cellulitis of her leg.  She is no longer taking antibiotics.  She is using aveno lotion.   Review of Systems  Skin: Positive for wound.    Patient Active Problem List   Diagnosis Date Noted  . Unilateral primary osteoarthritis, left knee 10/28/2017  . Hyperlipidemia 05/25/2017  . Osteoporosis 05/25/2017  . S/P TKR (total knee replacement), right 05/16/2017  . Unilateral primary osteoarthritis, right knee 11/01/2016  . History of PSVT 01/13/2014  . Chest pain 03/28/2011  . Palpitations 03/12/2011  . Dyslipidemia 01/12/2010  . Hypertension 01/12/2010  . CHEST PAIN, ATYPICAL 01/12/2010    Current Outpatient Medications on File Prior to Visit  Medication Sig Dispense Refill  . metoprolol succinate (TOPROL XL) 25 MG 24 hr tablet Take 1 tablet by mouth daily, may take 1 extra tablet by mouth daily only as needed for palpitations 150 tablet 3  . pravastatin (PRAVACHOL) 40 MG tablet Take 1 tablet (40 mg total) by mouth daily. 90 tablet 3  . pyrithione zinc (HEAD AND SHOULDERS) 1 % shampoo Apply topically daily as needed for itching.     Current Facility-Administered Medications on File Prior to Visit  Medication Dose Route Frequency Provider Last Rate Last Dose  . methylPREDNISolone acetate (DEPO-MEDROL) injection 40 mg  40 mg Intra-articular Once Hilts, Legrand Como, MD        Allergies  Allergen Reactions  . Indomethacin Swelling  . Tramadol     Sweaty, made feel bad     Objective:  BP 130/79   Pulse 63   Temp 98.1 F (36.7 C) (Oral)   Ht 5' 3.5" (1.613 m)   Wt 164 lb (74.4 kg)   SpO2 95%   BMI 28.60 kg/m   Physical Exam  Skin:  Vast improvement from last OV. No erythema or drainage.  Mild edema surrounding lesion. No TTP      Assessment and Plan :  1. Visit for wound check - Improved. RTC PRN.    Mercer Pod,  PA-C  [facility] 12/04/2017 5:37 PM  Please note: Portions of this report may have been transcribed using dragon voice recognition software. Every effort was made to ensure accuracy; however, inadvertent computerized transcription errors may be present.

## 2017-12-09 ENCOUNTER — Encounter (INDEPENDENT_AMBULATORY_CARE_PROVIDER_SITE_OTHER): Payer: Self-pay | Admitting: Orthopaedic Surgery

## 2017-12-09 ENCOUNTER — Ambulatory Visit (INDEPENDENT_AMBULATORY_CARE_PROVIDER_SITE_OTHER): Payer: Medicare Other | Admitting: Orthopaedic Surgery

## 2017-12-09 DIAGNOSIS — Z96651 Presence of right artificial knee joint: Secondary | ICD-10-CM

## 2017-12-09 NOTE — Progress Notes (Signed)
   Post-Op Visit Note   Patient: Ariel Harris           Date of Birth: [birth year]           MRN: [id number] Visit Date: 12/09/2017 PCP: Antonietta Jewel, MD   Assessment & Plan:  Chief Complaint:  Chief Complaint  Patient presents with  . Right Knee - Follow-up   Visit Diagnoses:  1. S/P TKR (total knee replacement), right     Plan: Patient is a pleasant 72-year-old female who presents to our clinic today with an interpreter.  She is here for follow-up of her right total knee replacement as well as right lower extremity wound.  In regards to the right knee, she is doing well.  No pain.  In regards to the right lower extremity wound, she has finished using mupirocin.  No fevers or chills and no increased pain.  Examination of the right lower extremity reveals a well-healed wound distal tibia.  No evidence of cellulitis or infection.  Right knee incision has nicely healed.  Range of motion of the knees from 0 to 110 degrees.  Stable valgus varus stress.  She will follow-up with [place] in 3 months time for repeat evaluation of the right total knee replacement.  Follow-Up Instructions: Return in about 3 months (around 03/11/2018).   Orders:  No orders of the defined types were placed in this encounter.  No orders of the defined types were placed in this encounter.   Imaging: No new imaging  PMFS History: Patient Active Problem List   Diagnosis Date Noted  . Unilateral primary osteoarthritis, left knee 10/28/2017  . Hyperlipidemia 05/25/2017  . Osteoporosis 05/25/2017  . S/P TKR (total knee replacement), right 05/16/2017  . Unilateral primary osteoarthritis, right knee 11/01/2016  . History of PSVT 01/13/2014  . Chest pain 03/28/2011  . Palpitations 03/12/2011  . Dyslipidemia 01/12/2010  . Hypertension 01/12/2010  . CHEST PAIN, ATYPICAL 01/12/2010   Past Medical History:  Diagnosis Date  . Arthritis    "knees" (05/16/2017)  . Chest pain   . Hyperlipidemia   . Hypertension   .  PAC (premature atrial contraction)   . SVT (supraventricular tachycardia) (HCC)     Family History  Problem Relation Age of Onset  . Coronary artery disease Neg Hx   . Heart attack Neg Hx   . Breast cancer Neg Hx     Past Surgical History:  Procedure Laterality Date  . JOINT REPLACEMENT    . KNEE ARTHROSCOPY Left 1996  . KNEE SURGERY Right [phone]   "open surgery; because of the pain"  . 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 TOTAL KNEE ARTHROPLASTY Right 05/16/2017   Procedure: RIGHT TOTAL KNEE ARTHROPLASTY;  Surgeon: Leandrew Koyanagi, MD;  Location: [facility];  Service: Orthopedics;  Laterality: Right;   Social History   Occupational History  . Not on file  Tobacco Use  . Smoking status: Never Smoker  . Smokeless tobacco: Never Used  Substance and Sexual Activity  . Alcohol use: No  . Drug use: No  . Sexual activity: Not Currently

## 2017-12-11 ENCOUNTER — Encounter: Payer: Self-pay | Admitting: Family Medicine

## 2017-12-11 ENCOUNTER — Other Ambulatory Visit: Payer: Self-pay

## 2017-12-11 ENCOUNTER — Ambulatory Visit (INDEPENDENT_AMBULATORY_CARE_PROVIDER_SITE_OTHER): Payer: Medicare Other | Admitting: Family Medicine

## 2017-12-11 VITALS — BP 138/68 | HR 62 | Temp 97.7°F | Ht 62.0 in | Wt 168.0 lb

## 2017-12-11 DIAGNOSIS — J040 Acute laryngitis: Secondary | ICD-10-CM | POA: Diagnosis not present

## 2017-12-11 DIAGNOSIS — J069 Acute upper respiratory infection, unspecified: Secondary | ICD-10-CM

## 2017-12-11 DIAGNOSIS — R059 Cough, unspecified: Secondary | ICD-10-CM

## 2017-12-11 DIAGNOSIS — R05 Cough: Secondary | ICD-10-CM | POA: Diagnosis not present

## 2017-12-11 MED ORDER — PROMETHAZINE-DM 6.25-15 MG/5ML PO SYRP
2.5000 mL | ORAL_SOLUTION | Freq: Every evening | ORAL | 0 refills | Status: DC | PRN
Start: 2017-12-11 — End: 2018-02-12

## 2017-12-11 MED ORDER — BENZONATATE 100 MG PO CAPS: 100.0000 mg | ORAL_CAPSULE | Freq: Three times a day (TID) | ORAL | 0 refills | Status: DC | PRN

## 2017-12-11 NOTE — Progress Notes (Signed)
Subjective:  By signing my name below, I, Moises Blood, attest that this documentation has been prepared under the direction and in the presence of Merri Ray, MD. Electronically Signed: Moises Blood, [place]. 12/11/2017 , 5:31 PM .  Patient was seen in Room 8 .   Patient ID: Ariel Harris, female    DOB: 1945/08/22, 72 y.o.   MRN: [id number] Chief Complaint  Patient presents with  . Cough    since friday  . itchy thraot    from cough   HPI Ariel Harris is a 72 y.o. female  Patient complains of cough and itchy throat since Friday (about 6 days ago). She states she's been coughing a lot into coughing fits, causing chest wall soreness. She has some itchy throat with scratchy voice recently. She describes cough being non productive. She's been taking OTC Nyquil, and a powder. She mentions coughing keeping her up at night. She denies fever, congestion or rhinorrhea.   Patient's primary language is [place]. Her granddaughter is present in the room to assist translation. Understanding expressed.   Patient Active Problem List   Diagnosis Date Noted  . Unilateral primary osteoarthritis, left knee 10/28/2017  . Hyperlipidemia 05/25/2017  . Osteoporosis 05/25/2017  . S/P TKR (total knee replacement), right 05/16/2017  . Unilateral primary osteoarthritis, right knee 11/01/2016  . History of PSVT 01/13/2014  . Chest pain 03/28/2011  . Palpitations 03/12/2011  . Dyslipidemia 01/12/2010  . Hypertension 01/12/2010  . CHEST PAIN, ATYPICAL 01/12/2010   Past Medical History:  Diagnosis Date  . Arthritis    "knees" (05/16/2017)  . Chest pain   . Hyperlipidemia   . Hypertension   . PAC (premature atrial contraction)   . SVT (supraventricular tachycardia) (HCC)    Past Surgical History:  Procedure Laterality Date  . JOINT REPLACEMENT    . KNEE ARTHROSCOPY Left 1996  . KNEE SURGERY Right [phone]   "open surgery; because of the pain"  . TOTAL KNEE ARTHROPLASTY Right  05/16/2017  . TOTAL KNEE ARTHROPLASTY Right 05/16/2017   Procedure: RIGHT TOTAL KNEE ARTHROPLASTY;  Surgeon: Leandrew Koyanagi, MD;  Location: [facility];  Service: Orthopedics;  Laterality: Right;   Allergies  Allergen Reactions  . Indomethacin Swelling  . Tramadol     Sweaty, made feel bad   Prior to Admission medications   Medication Sig Start Date End Date Taking? Authorizing Provider  metoprolol succinate (TOPROL XL) 25 MG 24 hr tablet Take 1 tablet by mouth daily, may take 1 extra tablet by mouth daily only as needed for palpitations 11/06/17   Dunn, Dayna N, PA-C  pravastatin (PRAVACHOL) 40 MG tablet Take 1 tablet (40 mg total) by mouth daily. 07/08/17   McVey, [name] Mink, PA-C  pyrithione zinc (HEAD AND SHOULDERS) 1 % shampoo Apply topically daily as needed for itching.    [provider]   Social History   Socioeconomic History  . Marital status: Divorced    Spouse name: Not on file  . Number of children: 2  . Years of education: Not on file  . Highest education level: Not on file  Occupational History  . Not on file  Social Needs  . Financial resource strain: Not on file  . Food insecurity:    Worry: Not on file    Inability: Not on file  . Transportation needs:    Medical: Not on file    Non-medical: Not on file  Tobacco Use  . Smoking status: Never Smoker  .  Smokeless tobacco: Never Used  Substance and Sexual Activity  . Alcohol use: No  . Drug use: No  . Sexual activity: Not Currently  Lifestyle  . Physical activity:    Days per week: Not on file    Minutes per session: Not on file  . Stress: Not on file  Relationships  . Social connections:    Talks on phone: Not on file    Gets together: Not on file    Attends religious service: Not on file    Active member of club or organization: Not on file    Attends meetings of clubs or organizations: Not on file    Relationship status: Not on file  . Intimate partner violence:    Fear of current or ex  partner: Not on file    Emotionally abused: Not on file    Physically abused: Not on file    Forced sexual activity: Not on file  Other Topics Concern  . Not on file  Social History Narrative   Negative family `history--Specifically artery disease or MI. The patient has two siblings died at a young age from fevers.   The patient is separated and single. She is retired but used to work at a [employer] and a [employer]. She is independent of daily living. She has never drink or smoke or done drugs   Review of Systems  Constitutional: Negative for chills, fatigue, fever and unexpected weight change.  HENT: Positive for sore throat and voice change. Negative for congestion and rhinorrhea.   Respiratory: Positive for cough.   Gastrointestinal: Negative for constipation, diarrhea, nausea and vomiting.  Musculoskeletal:       Chest wall soreness  Skin: Negative for rash and wound.  Neurological: Negative for dizziness, weakness and headaches.       Objective:   Physical Exam  Constitutional: She is oriented to person, place, and time. She appears well-developed and well-nourished. No distress.  HENT:  Head: Normocephalic and atraumatic.  Nose: Right sinus exhibits no maxillary sinus tenderness and no frontal sinus tenderness. Left sinus exhibits no maxillary sinus tenderness and no frontal sinus tenderness.  Mouth/Throat: No oropharyngeal exudate.  Eyes: Pupils are equal, round, and reactive to light. EOM are normal.  Neck: Neck supple.  Cardiovascular: Normal rate.  Pulmonary/Chest: Effort normal and breath sounds normal. No respiratory distress.  Musculoskeletal: Normal range of motion.  Lymphadenopathy:    She has no cervical adenopathy.  Neurological: She is alert and oriented to person, place, and time.  Skin: Skin is warm and dry.  Psychiatric: She has a normal mood and affect. Her behavior is normal.  Nursing note and vitals reviewed.   Vitals:   12/11/17 1629  BP: 138/68    Pulse: 62  Temp: 97.7 F (36.5 C)  TempSrc: Oral  SpO2: 96%  Weight: 168 lb (76.2 kg)  Height: 5\' 2"  (1.575 m)       Assessment & Plan:    Ariel Harris is a 72 y.o. female Cough - Plan: benzonatate (TESSALON) 100 MG capsule, promethazine-dextromethorphan (PROMETHAZINE-DM) 6.25-15 MG/5ML syrup  Laryngitis  Acute upper respiratory infection  Suspected viral upper respiratory infection, reassuring exam and vitals.    -Symptomatic care discussed, Tessalon Perles as needed for cough during the day, Phenergan DM if needed at night. voice rest, fluids.  RTC precautions if persistent or worsening,  - understanding expressed with translation from her granddaughter.  Video interpreter was offered but declined.  Meds ordered this encounter  Medications  .  benzonatate (TESSALON) 100 MG capsule    Sig: Take 1 capsule (100 mg total) by mouth 3 (three) times daily as needed for cough.    Dispense:  20 capsule    Refill:  0  . promethazine-dextromethorphan (PROMETHAZINE-DM) 6.25-15 MG/5ML syrup    Sig: Take 2.5-5 mLs by mouth at bedtime as needed for cough.    Dispense:  118 mL    Refill:  0   Patient Instructions     Cough is likely due to a virus at this time.  Tessalon Perles as needed up to 3 times per day, cough syrup at night if needed, see information below.  Make sure to drink plenty of fluids and voice rest as much as possible.  If you are not improving next week or worsening sooner, please return for recheck   Cough, Adult Coughing is a reflex that clears your throat and your airways. Coughing helps to heal and protect your lungs. It is normal to cough occasionally, but a cough that happens with other symptoms or lasts a long time may be a sign of a condition that needs treatment. A cough may last only 2-3 weeks (acute), or it may last longer than 8 weeks (chronic). What are the causes? Coughing is commonly caused by:  Breathing in substances that irritate your  lungs.  A viral or bacterial respiratory infection.  Allergies.  Asthma.  Postnasal drip.  Smoking.  Acid backing up from the stomach into the esophagus (gastroesophageal reflux).  Certain medicines.  Chronic lung problems, including COPD (or rarely, lung cancer).  Other medical conditions such as heart failure.  Follow these instructions at home: Pay attention to any changes in your symptoms. Take these actions to help with your discomfort:  Take medicines only as told by your health care provider. ? If you were prescribed an antibiotic medicine, take it as told by your health care provider. Do not stop taking the antibiotic even if you start to feel better. ? Talk with your health care provider before you take a cough suppressant medicine.  Drink enough fluid to keep your urine clear or pale yellow.  If the air is dry, use a cold steam vaporizer or humidifier in your bedroom or your home to help loosen secretions.  Avoid anything that causes you to cough at work or at home.  If your cough is worse at night, try sleeping in a semi-upright position.  Avoid cigarette smoke. If you smoke, quit smoking. If you need help quitting, ask your health care provider.  Avoid caffeine.  Avoid alcohol.  Rest as needed.  Contact a health care provider if:  You have new symptoms.  You cough up pus.  Your cough does not get better after 2-3 weeks, or your cough gets worse.  You cannot control your cough with suppressant medicines and you are losing sleep.  You develop pain that is getting worse or pain that is not controlled with pain medicines.  You have a fever.  You have unexplained weight loss.  You have night sweats. Get help right away if:  You cough up blood.  You have difficulty breathing.  Your heartbeat is very fast. This information is not intended to replace advice given to you by your health care provider. Make sure you discuss any questions you have  with your health care provider. Document Released: 07/13/2010 Document Revised: 06/22/2015 Document Reviewed: 03/23/2014 Elsevier Interactive Patient Education  2018 [employer].  Upper Respiratory Infection, Adult Most upper respiratory  infections (URIs) are caused by a virus. A URI affects the nose, throat, and upper air passages. The most common type of URI is often called "the common cold." Follow these instructions at home:  Take medicines only as told by your doctor.  Gargle warm saltwater or take cough drops to comfort your throat as told by your doctor.  Use a warm mist humidifier or inhale steam from a shower to increase air moisture. This may make it easier to breathe.  Drink enough fluid to keep your pee (urine) clear or pale yellow.  Eat soups and other clear broths.  Have a healthy diet.  Rest as needed.  Go back to work when your fever is gone or your doctor says it is okay. ? You may need to stay home longer to avoid giving your URI to others. ? You can also wear a face mask and wash your hands often to prevent spread of the virus.  Use your inhaler more if you have asthma.  Do not use any tobacco products, including cigarettes, chewing tobacco, or electronic cigarettes. If you need help quitting, ask your doctor. Contact a doctor if:  You are getting worse, not better.  Your symptoms are not helped by medicine.  You have chills.  You are getting more short of breath.  You have brown or red mucus.  You have yellow or brown discharge from your nose.  You have pain in your face, especially when you bend forward.  You have a fever.  You have puffy (swollen) neck glands.  You have pain while swallowing.  You have white areas in the back of your throat. Get help right away if:  You have very bad or constant: ? Headache. ? Ear pain. ? Pain in your forehead, behind your eyes, and over your cheekbones (sinus pain). ? Chest pain.  You have  long-lasting (chronic) lung disease and any of the following: ? Wheezing. ? Long-lasting cough. ? Coughing up blood. ? A change in your usual mucus.  You have a stiff neck.  You have changes in your: ? Vision. ? Hearing. ? Thinking. ? Mood. This information is not intended to replace advice given to you by your health care provider. Make sure you discuss any questions you have with your health care provider. Document Released: 07/03/2007 Document Revised: 09/17/2015 Document Reviewed: 04/21/2013 Elsevier Interactive Patient Education  [employer].   If you have lab work done today you will be contacted with your lab results within the next 2 weeks.  If you have not heard from [place] then please contact us. The fastest way to get your results is to register for My Chart.   IF you received an x-ray today, you will receive an invoice from [facility]. Please contact [facility] at [phone] with questions or concerns regarding your invoice.   IF you received labwork today, you will receive an invoice from [facility]. Please contact [facility] at [phone] with questions or concerns regarding your invoice.   Our billing staff will not be able to assist you with questions regarding bills from these companies.  You will be contacted with the lab results as soon as they are available. The fastest way to get your results is to activate your My Chart account. Instructions are located on the last page of this paperwork. If you have not heard from [place] regarding the results in 2 weeks, please contact this office.       I personally performed the services  described in this documentation, which was scribed in my presence. The recorded information has been reviewed and considered for accuracy and completeness, addended by me as needed, and agree with information above.  Signed,   Merri Ray, MD [facility].  12/15/17 12:02  PM

## 2017-12-11 NOTE — Patient Instructions (Addendum)
Cough is likely due to a virus at this time.  Tessalon Perles as needed up to 3 times per day, cough syrup at night if needed, see information below.  Make sure to drink plenty of fluids and voice rest as much as possible.  If you are not improving next week or worsening sooner, please return for recheck   Cough, Adult Coughing is a reflex that clears your throat and your airways. Coughing helps to heal and protect your lungs. It is normal to cough occasionally, but a cough that happens with other symptoms or lasts a long time may be a sign of a condition that needs treatment. A cough may last only 2-3 weeks (acute), or it may last longer than 8 weeks (chronic). What are the causes? Coughing is commonly caused by:  Breathing in substances that irritate your lungs.  A viral or bacterial respiratory infection.  Allergies.  Asthma.  Postnasal drip.  Smoking.  Acid backing up from the stomach into the esophagus (gastroesophageal reflux).  Certain medicines.  Chronic lung problems, including COPD (or rarely, lung cancer).  Other medical conditions such as heart failure.  Follow these instructions at home: Pay attention to any changes in your symptoms. Take these actions to help with your discomfort:  Take medicines only as told by your health care provider. ? If you were prescribed an antibiotic medicine, take it as told by your health care provider. Do not stop taking the antibiotic even if you start to feel better. ? Talk with your health care provider before you take a cough suppressant medicine.  Drink enough fluid to keep your urine clear or pale yellow.  If the air is dry, use a cold steam vaporizer or humidifier in your bedroom or your home to help loosen secretions.  Avoid anything that causes you to cough at work or at home.  If your cough is worse at night, try sleeping in a semi-upright position.  Avoid cigarette smoke. If you smoke, quit smoking. If you need help  quitting, ask your health care provider.  Avoid caffeine.  Avoid alcohol.  Rest as needed.  Contact a health care provider if:  You have new symptoms.  You cough up pus.  Your cough does not get better after 2-3 weeks, or your cough gets worse.  You cannot control your cough with suppressant medicines and you are losing sleep.  You develop pain that is getting worse or pain that is not controlled with pain medicines.  You have a fever.  You have unexplained weight loss.  You have night sweats. Get help right away if:  You cough up blood.  You have difficulty breathing.  Your heartbeat is very fast. This information is not intended to replace advice given to you by your health care provider. Make sure you discuss any questions you have with your health care provider. Document Released: 07/13/2010 Document Revised: 06/22/2015 Document Reviewed: 03/23/2014 Elsevier Interactive Patient Education  2018 [employer].  Upper Respiratory Infection, Adult Most upper respiratory infections (URIs) are caused by a virus. A URI affects the nose, throat, and upper air passages. The most common type of URI is often called "the common cold." Follow these instructions at home:  Take medicines only as told by your doctor.  Gargle warm saltwater or take cough drops to comfort your throat as told by your doctor.  Use a warm mist humidifier or inhale steam from a shower to increase air moisture. This may make it easier  to breathe.  Drink enough fluid to keep your pee (urine) clear or pale yellow.  Eat soups and other clear broths.  Have a healthy diet.  Rest as needed.  Go back to work when your fever is gone or your doctor says it is okay. ? You may need to stay home longer to avoid giving your URI to others. ? You can also wear a face mask and wash your hands often to prevent spread of the virus.  Use your inhaler more if you have asthma.  Do not use any tobacco products,  including cigarettes, chewing tobacco, or electronic cigarettes. If you need help quitting, ask your doctor. Contact a doctor if:  You are getting worse, not better.  Your symptoms are not helped by medicine.  You have chills.  You are getting more short of breath.  You have brown or red mucus.  You have yellow or brown discharge from your nose.  You have pain in your face, especially when you bend forward.  You have a fever.  You have puffy (swollen) neck glands.  You have pain while swallowing.  You have white areas in the back of your throat. Get help right away if:  You have very bad or constant: ? Headache. ? Ear pain. ? Pain in your forehead, behind your eyes, and over your cheekbones (sinus pain). ? Chest pain.  You have long-lasting (chronic) lung disease and any of the following: ? Wheezing. ? Long-lasting cough. ? Coughing up blood. ? A change in your usual mucus.  You have a stiff neck.  You have changes in your: ? Vision. ? Hearing. ? Thinking. ? Mood. This information is not intended to replace advice given to you by your health care provider. Make sure you discuss any questions you have with your health care provider. Document Released: 07/03/2007 Document Revised: 09/17/2015 Document Reviewed: 04/21/2013 Elsevier Interactive Patient Education  [employer].   If you have lab work done today you will be contacted with your lab results within the next 2 weeks.  If you have not heard from [place] then please contact us. The fastest way to get your results is to register for My Chart.   IF you received an x-ray today, you will receive an invoice from [facility]. Please contact [facility] at [phone] with questions or concerns regarding your invoice.   IF you received labwork today, you will receive an invoice from [facility]. Please contact [facility] at [phone] with questions or concerns regarding your invoice.   Our  billing staff will not be able to assist you with questions regarding bills from these companies.  You will be contacted with the lab results as soon as they are available. The fastest way to get your results is to activate your My Chart account. Instructions are located on the last page of this paperwork. If you have not heard from [place] regarding the results in 2 weeks, please contact this office.

## 2017-12-15 ENCOUNTER — Encounter: Payer: Self-pay | Admitting: Family Medicine

## 2018-01-01 ENCOUNTER — Encounter (INDEPENDENT_AMBULATORY_CARE_PROVIDER_SITE_OTHER): Payer: Self-pay | Admitting: Orthopaedic Surgery

## 2018-01-01 ENCOUNTER — Ambulatory Visit (INDEPENDENT_AMBULATORY_CARE_PROVIDER_SITE_OTHER): Payer: Medicare Other | Admitting: Orthopaedic Surgery

## 2018-01-01 DIAGNOSIS — Z96651 Presence of right artificial knee joint: Secondary | ICD-10-CM

## 2018-01-01 MED ORDER — DESONIDE 0.05 % EX CREA: TOPICAL_CREAM | Freq: Two times a day (BID) | CUTANEOUS | 2 refills | Status: DC

## 2018-01-01 NOTE — Progress Notes (Signed)
Office Visit Note   Patient: Ariel Harris           Date of Birth: 1945/06/06           MRN: [id number] Visit Date: 01/01/2018              Requested by: Antonietta Jewel, MD [address] [place], [place] [address] PCP: Antonietta Jewel, MD   Assessment & Plan: Visit Diagnoses:  1. S/P TKR (total knee replacement), right     Plan: Impression is right knee dermatitis.  She denies a history of nickel allergy.  I have sent in a prescription for desonide cream to use twice a day.  I would like to recheck her in 4 months for her one-year visit with 2 view x-rays of the right knee.  Follow-Up Instructions: Return in about 4 months (around 05/03/2018).   Orders:  No orders of the defined types were placed in this encounter.  Meds ordered this encounter  Medications  . desonide (DESOWEN) 0.05 % cream    Sig: Apply topically 2 (two) times daily.    Dispense:  30 g    Refill:  2      Procedures: No procedures performed   Clinical Data: No additional findings.   Subjective: Chief Complaint  Patient presents with  . Right Knee - Pain    Patient is 8 months status post right total knee replacement.  She is doing well reports no pain.  She recently developed a rash about 2 weeks ago across the front of her knee.  She denies any pain but just only itching.   Review of Systems   Objective: Vital Signs: There were no vitals taken for this visit.  Physical Exam  Ortho Exam Right knee exam shows dermatitis on the anterior aspect of the knee.  There is no problems with the surgical scar.  She has full painless range of motion of the knee.  There is no swelling. Specialty Comments:  No specialty comments available.  Imaging: No results found.   PMFS History: Patient Active Problem List   Diagnosis Date Noted  . Unilateral primary osteoarthritis, left knee 10/28/2017  . Hyperlipidemia 05/25/2017  . Osteoporosis 05/25/2017  . S/P TKR (total knee replacement), right  05/16/2017  . Unilateral primary osteoarthritis, right knee 11/01/2016  . History of PSVT 01/13/2014  . Chest pain 03/28/2011  . Palpitations 03/12/2011  . Dyslipidemia 01/12/2010  . Hypertension 01/12/2010  . CHEST PAIN, ATYPICAL 01/12/2010   Past Medical History:  Diagnosis Date  . Arthritis    "knees" (05/16/2017)  . Chest pain   . Hyperlipidemia   . Hypertension   . PAC (premature atrial contraction)   . SVT (supraventricular tachycardia) (HCC)     Family History  Problem Relation Age of Onset  . Coronary artery disease Neg Hx   . Heart attack Neg Hx   . Breast cancer Neg Hx     Past Surgical History:  Procedure Laterality Date  . JOINT REPLACEMENT    . KNEE ARTHROSCOPY Left 1996  . KNEE SURGERY Right [phone]   "open surgery; because of the pain"  . 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 TOTAL KNEE ARTHROPLASTY Right 05/16/2017   Procedure: RIGHT TOTAL KNEE ARTHROPLASTY;  Surgeon: Leandrew Koyanagi, MD;  Location: [facility];  Service: Orthopedics;  Laterality: Right;   Social History   Occupational History  . Not on file  Tobacco Use  . Smoking status: Never Smoker  .  Smokeless tobacco: Never Used  Substance and Sexual Activity  . Alcohol use: No  . Drug use: No  . Sexual activity: Not Currently

## 2018-01-02 ENCOUNTER — Other Ambulatory Visit (INDEPENDENT_AMBULATORY_CARE_PROVIDER_SITE_OTHER): Payer: Self-pay | Admitting: Orthopaedic Surgery

## 2018-01-02 NOTE — Telephone Encounter (Signed)
Please advise 

## 2018-01-08 ENCOUNTER — Ambulatory Visit (INDEPENDENT_AMBULATORY_CARE_PROVIDER_SITE_OTHER): Payer: Medicare Other | Admitting: Orthopaedic Surgery

## 2018-01-08 ENCOUNTER — Encounter (INDEPENDENT_AMBULATORY_CARE_PROVIDER_SITE_OTHER): Payer: Self-pay | Admitting: Orthopaedic Surgery

## 2018-01-08 DIAGNOSIS — Z96651 Presence of right artificial knee joint: Secondary | ICD-10-CM | POA: Diagnosis not present

## 2018-01-08 NOTE — Progress Notes (Signed)
Office Visit Note   Patient: Ariel Harris           Date of Birth: [birth year]           MRN: [id number] Visit Date: 01/08/2018              Requested by: Antonietta Jewel, MD [address] [place], [place] [address] PCP: Antonietta Jewel, MD   Assessment & Plan: Visit Diagnoses:  1. S/P TKR (total knee replacement), right     Plan: Impression is resolving dermatitis anterior knee following total knee replacement surgery.  The patient will continue to use the desonide cream for another 2 to 4 weeks.  She will follow-up with [place] in 4 weeks time if she does not continue to improve.  Otherwise, follow-up with [place] at her regularly scheduled appointment this spring.  Call with concerns or questions in the meantime.  Follow-Up Instructions: Return for regularly scheduled appt this spring.   Orders:  No orders of the defined types were placed in this encounter.  No orders of the defined types were placed in this encounter.     Procedures: No procedures performed   Clinical Data: No additional findings.   Subjective: Chief Complaint  Patient presents with  . Right Knee - Pain    HPI patient is a pleasant 72 year old female who comes in today with an interpreter.  She is here with concerns about her right knee.  Status post right total knee replacement 05/16/2017 with [employer] triathlon uncemented implant.  She has been doing well along with intermittent pain but noticed a rash to the right knee about 2 weeks ago.  She was seen in our office where she was prescribed desonide.  She has been using this twice daily.  She does note improvement of symptoms over the past few weeks.  No previous nickel allergy.  Review of Systems was detailed in HPI.  All others reviewed and are negative.   Objective: Vital Signs: There were no vitals taken for this visit.  Physical Exam well-developed well-nourished female no acute distress.  Alert and oriented x3.  Ortho Exam examination of the right  knee shows a well-healed surgical incision with no evidence of infection.  No effusion.  Painless range of motion from 0 to 120 degrees.  She does have what appears to be resolving dermatitis to the distal half the anterior aspect of the knee.  Specialty Comments:  No specialty comments available.  Imaging: No new imaging   PMFS History: Patient Active Problem List   Diagnosis Date Noted  . Unilateral primary osteoarthritis, left knee 10/28/2017  . Hyperlipidemia 05/25/2017  . Osteoporosis 05/25/2017  . S/P TKR (total knee replacement), right 05/16/2017  . Unilateral primary osteoarthritis, right knee 11/01/2016  . History of PSVT 01/13/2014  . Chest pain 03/28/2011  . Palpitations 03/12/2011  . Dyslipidemia 01/12/2010  . Hypertension 01/12/2010  . CHEST PAIN, ATYPICAL 01/12/2010   Past Medical History:  Diagnosis Date  . Arthritis    "knees" (05/16/2017)  . Chest pain   . Hyperlipidemia   . Hypertension   . PAC (premature atrial contraction)   . SVT (supraventricular tachycardia) (HCC)     Family History  Problem Relation Age of Onset  . Coronary artery disease Neg Hx   . Heart attack Neg Hx   . Breast cancer Neg Hx     Past Surgical History:  Procedure Laterality Date  . JOINT REPLACEMENT    . KNEE ARTHROSCOPY Left  [place] Right [phone]   "open surgery; because of the pain"  . 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 TOTAL KNEE ARTHROPLASTY Right 05/16/2017   Procedure: RIGHT TOTAL KNEE ARTHROPLASTY;  Surgeon: Leandrew Koyanagi, MD;  Location: [facility];  Service: Orthopedics;  Laterality: Right;   Social History   Occupational History  . Not on file  Tobacco Use  . Smoking status: Never Smoker  . Smokeless tobacco: Never Used  Substance and Sexual Activity  . Alcohol use: No  . Drug use: No  . Sexual activity: Not Currently

## 2018-02-12 ENCOUNTER — Encounter: Payer: Self-pay | Admitting: Emergency Medicine

## 2018-02-12 ENCOUNTER — Other Ambulatory Visit: Payer: Self-pay

## 2018-02-12 ENCOUNTER — Ambulatory Visit (INDEPENDENT_AMBULATORY_CARE_PROVIDER_SITE_OTHER): Payer: Medicare Other | Admitting: Emergency Medicine

## 2018-02-12 VITALS — BP 130/73 | HR 65 | Temp 98.5°F | Resp 14 | Ht 62.0 in | Wt 164.6 lb

## 2018-02-12 DIAGNOSIS — M5441 Lumbago with sciatica, right side: Secondary | ICD-10-CM

## 2018-02-12 LAB — POCT URINALYSIS DIP (MANUAL ENTRY)
BILIRUBIN UA: NEGATIVE mg/dL
Bilirubin, UA: NEGATIVE
GLUCOSE UA: NEGATIVE mg/dL
LEUKOCYTES UA: NEGATIVE
Nitrite, UA: NEGATIVE
Protein Ur, POC: NEGATIVE mg/dL
SPEC GRAV UA: 1.025 (ref 1.010–1.025)
Urobilinogen, UA: 0.2 E.U./dL
pH, UA: 5.5 (ref 5.0–8.0)

## 2018-02-12 NOTE — Patient Instructions (Addendum)
If you have lab work done today you will be contacted with your lab results within the next 2 weeks.  If you have not heard from [place] then please contact us. The fastest way to get your results is to register for My Chart.   IF you received an x-ray today, you will receive an invoice from [facility]. Please contact [facility] at [phone] with questions or concerns regarding your invoice.   IF you received labwork today, you will receive an invoice from [facility]. Please contact [facility] at [phone] with questions or concerns regarding your invoice.   Our billing staff will not be able to assist you with questions regarding bills from these companies.  You will be contacted with the lab results as soon as they are available. The fastest way to get your results is to activate your My Chart account. Instructions are located on the last page of this paperwork. If you have not heard from [place] regarding the results in 2 weeks, please contact this office.      ?au th?n kinh to?a Sciatica  ?au th?n kinh to?a la? ?au, t, y?u Plouff??c ?au bu?t do?c theo ????ng ?i cu?a dy th?n kinh t?a. Dy th?n kinh t?a b??t ??u ?? th??t l?ng va? cha?y xu?ng m?t sau c?a m?i chn. Dy th?n kinh na?y ki?m soa?t ca?c c? ?? c??ng chn va? ?? m?t sau ??u g?i. Dy th?n kinh ny cu?ng ta?o ra ca?m gia?c (ca?m gia?c) cho m?t sau ?u?i, c??ng chn va? gan ba?n chn. ?au th?n kinh to?a la? m?t tri?u ch??ng cu?a m?t b?nh ly? kha?c la?m bo? ch??t [place]??c ti? ?e? ln dy th?n kinh to?a. Nhn chung, ?au th?n kinh toa? chi? ?nh h??ng ??n m?t bn cu?a c? th?. ?au th?n kinh to?a th???ng t?? kho?i Cauble??c ???c ?i?u tri? kho?i. Trong m?t s? tr???ng h??p, ?au th?n kinh to?a co? th? tr?? la?i (ta?i pha?t). Nguyn nhn g gy ra? Ti?nh tra?ng na?y xa?y ra khi co? chn p ln dy th?n kinh to?a Neria??c khi dy th?n kinh to?a bi? bo? ch?t. ?i?u ? co? th? la? k?t qua? cu?a:  M?t ?i?a ??m  gi??a ca?c x??ng c?t s?ng (??t s?ng) l?i ra qua? nhi?u (thoa?t vi? ?i?a ??m).  Nh??ng thay ??i lin quan ??n tu?i th? cu?a ?i?a ??m (b?nh thoa?i Raucci?a ?i?a ??m).  R?i loa?n ?au a?nh h???ng ??n m?t c? ?? mng (h?i ch??ng c? hi?nh qua? l).  X??ng mo?c thm (ch?i x??ng) g?n dy th?n kinh to?a.  B?t ky? th??ng t?n hay ga?y v?? na?o (ga?y x??ng) ?? vu?ng ch?u.  Mang [place].  Kh?i u (hi?m g??p). ?i?u g lm t?ng nguy c?? Nh?ng y?u t? sau c th? lm qu v? d? b? tnh tr?ng ny h?n:  Ch?i ca?c mn th? thao ti? ?e? [place]??c e?p ln c?t s?ng, ch??ng ha?n nh? bo?ng ?a? Blish??c nng ta?Marland Kitchen  Co? s??c b?n va? ?? linh hoa?t ke?m.  Co? ti?n s?? bi? ch?n th??ng ?? l?ng.  Co? ti?n s?? bi? ph?u thu?t ? l?ng.  Ng?i trong th?i [employer].  Th??c hi?n ca?c hoa?t ??ng lin quan ??n g?p ng???i Metzer??c nng v?t n??ng l??p ?i l??p la?i.  Bo ph. Cc d?u hi?u Revak?c tri?u ch?ng l g? Ca?c tri?u ch??ng co? th? kha?c nhau t?? nhe? cho ??n r?t n??ng va? cc tri?u ch?ng ? co? th? bao g?m:  B?t ky? v?n ?? na?o trong s? cc v?n ?? na?y ?? th??t l?ng, chn, hng Bless??c mng: ? ?  au bu?t nhe? Finamore??c ?au m ?. ? Ca?m gia?c no?ng ra?t. ? ?au nho?i.  T ?? sau b??p chn Selk??c gan ba?n chn.  Y?u chn.  ?au l?ng r?t nhi?u la?m c?? ??ng kho? kh?n. Nh??ng tri?u ch??ng na?y co? th? tr?m tr?ng h?n khi Sauls, h??t h?i Skilling??c c???i, [place]??c khi quy? vi? ng?i Rickels??c ???ng trong th??i gian da?i. Bi? th??a cn cu?ng co? th? la?m ca?c tri?u ch??ng tr?m tr?ng h?n. Trong m?t s? tr???ng h??p, ca?c tri?u ch??ng co? th? ti pht theo th??i gian. Ch?n ?on tnh tr?ng ny nh? th? no? Tnh tr?ng ny c th? ???c ch?n ?on d?a vo:  Tri?u ch?ng c?a qu v?.  Khm th?c th?Paulino Rily gia ch?m so?c s??c kho?e co? th? yu c?u quy? vi? th?c hi?n m?t s? c?? ??ng nh?t ??nh ?? ki?m tra xem nh??ng c?? ??ng ?o? co? la?m kh??i pha?t ca?c tri?u ch??ng khng.  Qu v? c th? ???c lm cc ki?m  tra, bao g?m: ? Xt nghi?m mu. ? Ch?p X quang. ? Ch?p MRI. ? Ch?p CT. Tnh tr?ng ny ???c ?i?u tr? nh? th? no? Trong nhi?u tr???ng h??p, b?nh na?y t?? ??? ma? khng c?n b?t c? ?i?u tri? g. Tuy nhin, vi?c ?i?u tr? c th? bao g?m:  Gia?m Dorvil??c ?i?u chi?nh hoa?t ??ng th? ch?t trong th??i gian bi? ?au.  T?p th? du?c va? ke?o c?ng ?? la?m t?ng s?c b?n c?a bu?ng va? t?ng ?? linh hoa?t cu?a c?ng s?ng.  Ch??m ?a? Joslyn??c ch???m nng vo vng b? ?nh h??ng.  Thu?c ?? gip: ? Gia?m ?au va? gi?m s?ng. ? Th? gin cc c? c?a qu v?.  Tim thu?c giu?p gia?m ?au, gia?m ki?ch thch va? gia?m vim quanh dy th?n kinh to?a (steroids).  Ph?u thu?t. Tun th? nh?ng h??ng d?n ny ? nh: Thu?c  Ch? s? d?ng thu?c khng k ??n v thu?c k ??n theo ch? d?n c?a chuyn gia ch?m [place] s?c kh?e.  Khng li xe Geeting?c v?n hnh my mc h?ng n?ng trong khi dng thu?c gi?m ?au ???c k ??n. X? tr ?au  N?u ???c ch? d?n, ch??m ? l?nh vo vng b? ?nh h??ng. ? Cho ? l?nh vo ti ni lng. ? ?? kh?n t?m ? gi?a da v ti ch??m. ? Ch??m ? l?nh trong 20 pht, 2-3 l?n m?i ngy.  Sau khi ch???m ?a?, ha?y ch???m no?ng va?o vu?ng bi? a?nh h???ng tr???c khi t?p th? du?c Gil??c theo chi? d?n cu?a chuyn gia ch?m so?c s??c kho?e. S? d?ng ngu?n nhi?t m chuyn gia ch?m [place] s?c kh?e khuy?n ngh?, ch?ng h?n nh? ti ch??m nhi?t ?m Cadena?c ??m ch??m nng. ? ?? kh?n t?m ? gi?a da v ngu?n nhi?t. ? Duy tr ngu?n nhi?t trong 20-30 pht. ? B? ngu?n nhi?t ra n?u da qu v? chuy?n sang mu ?? nh?t. ?i?u ny ??c bi?t quan tr?ng n?u qu v? khng th? c?m th?y ?au, nng, hay l?nh. Qu v? c th? c nguy c? b? b?ng cao h?n. Gaddie?t ??ng  Tr? l?i sinh [place]oly[place]ux?t bnh th??ng theo ch? d?n c?a chuyn gia ch?m  s?c kh?e. H?i chuyn gia ch?m  s?c kh?e v? cc [place]?t ??ng no an ton cho qu v?. ? Trnh nh?ng Hyder?t ??ng lm cho cc tri?u ch?ng c?a qu v? tr?m tr?ng h?n.  Nghi? ng?i trong nh??ng khoa?ng th??i gian ng??n trong nga?y. Nghi?  ng?i ?? t? th? n??m Monsour??c ???ng th???ng t?t h?n la? ng?i nghi?. ? Khi quy? vi? nghi? ng?i trong th??i gian da?i,  xem l?n va?i hoa?t ??ng nhe? nha?ng [place]??c ke?o c?ng gi??a ca?c giai ?oa?n nghi?. Vi?c na?y se? giu?p tra?nh c??ng kh?p va? ?au. ? Tra?nh ng?i trong th??i gian da?i khng c?? ??ng. ??ng d?y va? ?i la?i i?t nh?t m?i gi?? m?t l?n.  T?p th? d?c va? ke?o c?ng th??ng xuyn theo ch? d?n c?a chuyn gia ch?m [place] s?c kh?e.  Khng nng b?t k? v?t g n?ng h?n 10 lb (4,5 kg) trong khi quy? vi? co? ca?c tri?u ch??ng ?au th?n kinh to?a. Khi khng co? tri?u ch??ng, quy? vi? v?n c?n tra?nh nng v?t n??ng, ???c bi?t la? nng v?t n??ng l??p ?i l??p la?i.  Khi quy? vi? nng v?t n??ng, ha?y lun s?? du?ng ky? thu?t nng phu? h??p, bao g?m: ? Cong ??u g?i. ? Gi?? cho v?t n??ng g?n c? th? quy? vi?. ? Tra?nh v??n ng???i. H??ng d?n chung  S?? du?ng t? th? ?u?ng. ? Tra?nh cu?i ng???i v? phi?a tr???c trong khi ng?i. ? Tra?nh cu?i ng???i v? phi?a tr???c trong khi ???ng.  Duy tr m??c cn n?ng c l?i cho s?c kh?e. Cn n??ng qua? m??c ta?o thm a?p l??c cho l?ng quy? vi? va? la?m kho? kh?n trong vi?c duy tri? t? th? t?t.  ?i gia?y h? tr??, thoa?i ma?i. Tra?nh ?i gia?y cao go?t.  Tra?nh ngu? trn ??m qua? m?m Vara??c qua? c??ng. ??m ?u? c??ng ?? nng ??? l?ng quy? vi? khi quy? vi? ngu? co? th? giu?p lm gia?m ?au.  Tun th? t?t c? cc l?n khm theo di theo ch? d?n c?a chuyn gia ch?m [place] s?c kh?e. ?i?u ny c vai tr quan tr?ng. Hy lin l?c v?i chuyn gia ch?m [place] s?c kh?e n?u:  Quy? vi? bi? ?au la?m quy? vi? th??c d?y khi ngu?Floyde Parkins? vi? bi? ?au va? ?au tr?m tr?ng h?n khi n??m xu?ng.  C?n ?au cu?a quy? vi? tr?m tr?ng h?n c?n ?au ?a? t??ng bi? tr???c ?y.  C?n ?au cu?a quy? vi? ke?o da?i h?n 4 tu?n.  Qu v? b? s?t cn khng r nguyn nhn. Yu c?u tr? gip ngay l?p t?c n?u:  Qu v? m?t ki?m sot vi?c ??i ti?n v ti?u ti?n (khng t?? chu?).  Qu v?  bi?: ? Y?u ?? th??t l?ng, khung ch?u, mng Spees??c chn tr?? nn tr?m tr?ng h?n. ? ?o? Digilio??c s?ng ?? l?ng. ? Ca?m gia?c no?ng ra?t khi ?i ti?u. Thng tin ny khng nh?m m?c ?ch thay th? cho l?i khuyn m chuyn gia ch?m [place] s?c kh?e ni v?i qu v?. Hy b?o ??m qu v? ph?i th?o lu?n b?t k? v?n ?? g m qu v? c v?i chuyn gia ch?m [place] s?c kh?e c?a qu v?. Document Released: 07/16/2011 Document Revised: 05/02/2016 Document Reviewed: 09/23/2014 Elsevier Interactive Patient Education  2019 [employer].

## 2018-02-12 NOTE — Progress Notes (Signed)
[place] Stowers 73 y.o.   Chief Complaint  Patient presents with  . Back Pain    low left side of back radiate down right butt check x 6 days    HISTORY OF PRESENT ILLNESS: [name] complaining of right-sided lumbar pain radiating to the buttock and down the leg for the last 6 days.  No other significant symptoms.  Pain is sharp and constant.  HPI   Prior to Admission medications   Medication Sig Start Date End Date Taking? Authorizing Provider  fluticasone (CUTIVATE) 0.05 % cream Please specify directions, refills and quantity 01/02/18  Yes Leandrew Koyanagi, MD  metoprolol succinate (TOPROL XL) 25 MG 24 hr tablet Take 1 tablet by mouth daily, may take 1 extra tablet by mouth daily only as needed for palpitations 11/06/17  Yes Dunn, Dayna N, PA-C  pravastatin (PRAVACHOL) 40 MG tablet Take 1 tablet (40 mg total) by mouth daily. 07/08/17  Yes McVey, [name] Mink, PA-C    Allergies  Allergen Reactions  . Indomethacin Swelling  . Tramadol     Sweaty, made feel bad    Patient Active Problem List   Diagnosis Date Noted  . Unilateral primary osteoarthritis, left knee 10/28/2017  . Hyperlipidemia 05/25/2017  . Osteoporosis 05/25/2017  . S/P TKR (total knee replacement), right 05/16/2017  . Unilateral primary osteoarthritis, right knee 11/01/2016  . History of PSVT 01/13/2014  . Chest pain 03/28/2011  . Palpitations 03/12/2011  . Dyslipidemia 01/12/2010  . Hypertension 01/12/2010  . CHEST PAIN, ATYPICAL 01/12/2010    Past Medical History:  Diagnosis Date  . Arthritis    "knees" (05/16/2017)  . Chest pain   . Hyperlipidemia   . Hypertension   . PAC (premature atrial contraction)   . SVT (supraventricular tachycardia) (HCC)     Past Surgical History:  Procedure Laterality Date  . JOINT REPLACEMENT    . KNEE ARTHROSCOPY Left 1996  . KNEE SURGERY Right [phone]   "open surgery; because of the pain"  . 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  TOTAL KNEE ARTHROPLASTY Right 05/16/2017   Procedure: RIGHT TOTAL KNEE ARTHROPLASTY;  Surgeon: Leandrew Koyanagi, MD;  Location: [facility];  Service: Orthopedics;  Laterality: Right;    Social History   Socioeconomic History  . Marital status: Divorced    Spouse name: Not on file  . Number of children: 2  . Years of education: Not on file  . Highest education level: Not on file  Occupational History  . Not on file  Social Needs  . Financial resource strain: Not on file  . Food insecurity:    Worry: Not on file    Inability: Not on file  . Transportation needs:    Medical: Not on file    Non-medical: Not on file  Tobacco Use  . Smoking status: Never Smoker  . Smokeless tobacco: Never Used  Substance and Sexual Activity  . Alcohol use: No  . Drug use: No  . Sexual activity: Not Currently  Lifestyle  . Physical activity:    Days per week: Not on file    Minutes per session: Not on file  . Stress: Not on file  Relationships  . Social connections:    Talks on phone: Not on file    Gets together: Not on file    Attends religious service: Not on file    Active member of club or organization: Not on file    Attends meetings  of clubs or organizations: Not on file    Relationship status: Not on file  . Intimate partner violence:    Fear of current or ex partner: Not on file    Emotionally abused: Not on file    Physically abused: Not on file    Forced sexual activity: Not on file  Other Topics Concern  . Not on file  Social History Narrative   Negative family `history--Specifically artery disease or MI. The patient has two siblings died at a young age from fevers.   The patient is separated and single. She is retired but used to work at a [employer] and a [employer]. She is independent of daily living. She has never drink or smoke or done drugs    Family History  Problem Relation Age of Onset  . Coronary artery disease Neg Hx   . Heart attack Neg Hx   . Breast cancer Neg Hx       Review of Systems  Constitutional: Negative.  Negative for fever.  HENT: Negative.   Eyes: Negative.   Respiratory: Negative.  Negative for cough and shortness of breath.   Cardiovascular: Negative.  Negative for chest pain, palpitations and leg swelling.  Gastrointestinal: Negative.  Negative for abdominal pain, blood in stool, diarrhea, nausea and vomiting.  Genitourinary: Negative.  Negative for dysuria.  Musculoskeletal: Positive for back pain. Negative for myalgias and neck pain.  Skin: Negative.  Negative for rash.  Neurological: Negative.  Negative for dizziness and headaches.  Endo/Heme/Allergies: Negative.     Vitals:   02/12/18 1155  BP: 130/73  Pulse: 65  Resp: 14  Temp: 98.5 F (36.9 C)  SpO2: 98%    Physical Exam Vitals signs reviewed.  Constitutional:      Appearance: Normal appearance.  HENT:     Head: Normocephalic and atraumatic.     Nose: Nose normal.     Mouth/Throat:     Mouth: Mucous membranes are moist.     Pharynx: Oropharynx is clear.  Eyes:     Conjunctiva/sclera: Conjunctivae normal.     Pupils: Pupils are equal, round, and reactive to light.  Neck:     Musculoskeletal: Normal range of motion.  Cardiovascular:     Rate and Rhythm: Normal rate and regular rhythm.     Pulses: Normal pulses.     Heart sounds: Normal heart sounds.  Pulmonary:     Breath sounds: Normal breath sounds.  Abdominal:     General: There is no distension.     Palpations: Abdomen is soft. There is no mass.     Tenderness: There is no abdominal tenderness.  Musculoskeletal:     Lumbar back: She exhibits decreased range of motion, tenderness and spasm. She exhibits no bony tenderness and normal pulse.       Back:  Skin:    General: Skin is warm and dry.     Capillary Refill: Capillary refill takes less than 2 seconds.  Neurological:     General: No focal deficit present.     Mental Status: She is alert and oriented to person, place, and time.     Sensory:  No sensory deficit.     Coordination: Coordination normal.     Gait: Gait normal.     Deep Tendon Reflexes: Reflexes normal.  Psychiatric:        Mood and Affect: Mood normal.        Behavior: Behavior normal.    A total of 25 minutes was spent  in the room with the patient, greater than 50% of which was in counseling/coordination of care regarding diagnosis, treatment, medications, prognosis, and need for follow-up.   ASSESSMENT & PLAN: Naida was seen today for back pain.  Diagnoses and all orders for this visit:  Acute right-sided low back pain with right-sided sciatica -     POCT urinalysis dipstick    Patient Instructions       If you have lab work done today you will be contacted with your lab results within the next 2 weeks.  If you have not heard from [place] then please contact us. The fastest way to get your results is to register for My Chart.   IF you received an x-ray today, you will receive an invoice from [facility]. Please contact [facility] at [phone] with questions or concerns regarding your invoice.   IF you received labwork today, you will receive an invoice from [facility]. Please contact [facility] at [phone] with questions or concerns regarding your invoice.   Our billing staff will not be able to assist you with questions regarding bills from these companies.  You will be contacted with the lab results as soon as they are available. The fastest way to get your results is to activate your My Chart account. Instructions are located on the last page of this paperwork. If you have not heard from [place] regarding the results in 2 weeks, please contact this office.      ?au th?n kinh to?a Sciatica  ?au th?n kinh to?a la? ?au, t, y?u [place]??c ?au bu?t do?c theo ????ng ?i cu?a dy th?n kinh t?a. Dy th?n kinh t?a b??t ??u ?? th??t l?ng va? cha?y xu?ng m?t sau c?a m?i chn. Dy th?n kinh na?y ki?m soa?t ca?c c? ?? c??ng chn va? ??  m?t sau ??u g?i. Dy th?n kinh ny cu?ng ta?o ra ca?m gia?c (ca?m gia?c) cho m?t sau ?u?i, c??ng chn va? gan ba?n chn. ?au th?n kinh to?a la? m?t tri?u ch??ng cu?a m?t b?nh ly? kha?c la?m bo? ch??t Maher??c ti? ?e? ln dy th?n kinh to?a. Nhn chung, ?au th?n kinh toa? chi? ?nh h??ng ??n m?t bn cu?a c? th?. ?au th?n kinh to?a th???ng t?? kho?i Kinne??c ???c ?i?u tri? kho?i. Trong m?t s? tr???ng h??p, ?au th?n kinh to?a co? th? tr?? la?i (ta?i pha?t). Nguyn nhn g gy ra? Ti?nh tra?ng na?y xa?y ra khi co? chn p ln dy th?n kinh to?a [place]??c khi dy th?n kinh to?a bi? bo? ch?t. ?i?u ? co? th? la? k?t qua? cu?a:  M?t ?i?a ??m gi??a ca?c x??ng c?t s?ng (??t s?ng) l?i ra qua? nhi?u (thoa?t vi? ?i?a ??m).  Nh??ng thay ??i lin quan ??n tu?i th? cu?a ?i?a ??m (b?nh thoa?i Sheffer?a ?i?a ??m).  R?i loa?n ?au a?nh h???ng ??n m?t c? ?? mng (h?i ch??ng c? hi?nh qua? l).  X??ng mo?c thm (ch?i x??ng) g?n dy th?n kinh to?a.  B?t ky? th??ng t?n hay ga?y v?? na?o (ga?y x??ng) ?? vu?ng ch?u.  Mang [place].  Kh?i u (hi?m g??p). ?i?u g lm t?ng nguy c?? Nh?ng y?u t? sau c th? lm qu v? d? b? tnh tr?ng ny h?n:  Ch?i ca?c mn th? thao ti? ?e? [place]??c e?p ln c?t s?ng, ch??ng ha?n nh? bo?ng ?a? Crandall??c nng ta?Marland Kitchen  Co? s??c b?n va? ?? linh hoa?t ke?m.  Co? ti?n s?? bi? ch?n th??ng ?? l?ng.  Co? ti?n s?? bi? ph?u thu?t ? l?ng.  Ng?i trong th?i [employer].  Th??c  hi?n ca?c hoa?t ??ng lin quan ??n g?p ng???i Poorman??c nng v?t n??ng l??p ?i l??p la?i.  Bo ph. Cc d?u hi?u Bitterman?c tri?u ch?ng l g? Ca?c tri?u ch??ng co? th? kha?c nhau t?? nhe? cho ??n r?t n??ng va? cc tri?u ch?ng ? co? th? bao g?m:  B?t ky? v?n ?? na?o trong s? cc v?n ?? na?y ?? th??t l?ng, chn, hng Rennels??c mng: ? ?au bu?t nhe? Acevedo??c ?au m ?. ? Ca?m gia?c no?ng ra?t. ? ?au nho?i.  T ?? sau b??p chn Meir??c gan ba?n chn.  Y?u chn.  ?au l?ng r?t nhi?u la?m c??  ??ng kho? kh?n. Nh??ng tri?u ch??ng na?y co? th? tr?m tr?ng h?n khi Blowe, h??t h?i Rochelle??c c???i, Gahan??c khi quy? vi? ng?i Metzinger??c ???ng trong th??i gian da?i. Bi? th??a cn cu?ng co? th? la?m ca?c tri?u ch??ng tr?m tr?ng h?n. Trong m?t s? tr???ng h??p, ca?c tri?u ch??ng co? th? ti pht theo th??i gian. Ch?n ?on tnh tr?ng ny nh? th? no? Tnh tr?ng ny c th? ???c ch?n ?on d?a vo:  Tri?u ch?ng c?a qu v?.  Khm th?c th?Paulino Rily gia ch?m so?c s??c kho?e co? th? yu c?u quy? vi? th?c hi?n m?t s? c?? ??ng nh?t ??nh ?? ki?m tra xem nh??ng c?? ??ng ?o? co? la?m kh??i pha?t ca?c tri?u ch??ng khng.  Qu v? c th? ???c lm cc ki?m tra, bao g?m: ? Xt nghi?m mu. ? Ch?p X quang. ? Ch?p MRI. ? Ch?p CT. Tnh tr?ng ny ???c ?i?u tr? nh? th? no? Trong nhi?u tr???ng h??p, b?nh na?y t?? ??? ma? khng c?n b?t c? ?i?u tri? g. Tuy nhin, vi?c ?i?u tr? c th? bao g?m:  Gia?m Tecson??c ?i?u chi?nh hoa?t ??ng th? ch?t trong th??i gian bi? ?au.  T?p th? du?c va? ke?o c?ng ?? la?m t?ng s?c b?n c?a bu?ng va? t?ng ?? linh hoa?t cu?a c?ng s?ng.  Ch??m ?a? Malinowski??c ch???m nng vo vng b? ?nh h??ng.  Thu?c ?? gip: ? Gia?m ?au va? gi?m s?ng. ? Th? gin cc c? c?a qu v?.  Tim thu?c giu?p gia?m ?au, gia?m ki?ch thch va? gia?m vim quanh dy th?n kinh to?a (steroids).  Ph?u thu?t. Tun th? nh?ng h??ng d?n ny ? nh: Thu?c  Ch? s? d?ng thu?c khng k ??n v thu?c k ??n theo ch? d?n c?a chuyn gia ch?m [place] s?c kh?e.  Khng li xe Dayhoff?c v?n hnh my mc h?ng n?ng trong khi dng thu?c gi?m ?au ???c k ??n. X? tr ?au  N?u ???c ch? d?n, ch??m ? l?nh vo vng b? ?nh h??ng. ? Cho ? l?nh vo ti ni lng. ? ?? kh?n t?m ? gi?a da v ti ch??m. ? Ch??m ? l?nh trong 20 pht, 2-3 l?n m?i ngy.  Sau khi ch???m ?a?, ha?y ch???m no?ng va?o vu?ng bi? a?nh h???ng tr???c khi t?p th? du?c Balch??c theo chi? d?n cu?a chuyn gia ch?m so?c s??c kho?e. S? d?ng ngu?n nhi?t m chuyn gia  ch?m [place] s?c kh?e khuy?n ngh?, ch?ng h?n nh? ti ch??m nhi?t ?m Hoh?c ??m ch??m nng. ? ?? kh?n t?m ? gi?a da v ngu?n nhi?t. ? Duy tr ngu?n nhi?t trong 20-30 pht. ? B? ngu?n nhi?t ra n?u da qu v? chuy?n sang mu ?? nh?t. ?i?u ny ??c bi?t quan tr?ng n?u qu v? khng th? c?m th?y ?au, nng, hay l?nh. Qu v? c th? c nguy c? b? b?ng cao h?n. Hehr?t ??ng  Tr? l?i sinh Dargan?t bnh th??ng theo ch? d?n c?a chuyn gia ch?m [place]  s?c kh?e. H?i chuyn gia ch?m [place] s?c kh?e v? cc [place]?t ??ng no an ton cho qu v?. ? Trnh nh?ng Bold?t ??ng lm cho cc tri?u ch?ng c?a qu v? tr?m tr?ng h?n.  Nghi? ng?i trong nh??ng khoa?ng th??i gian ng??n trong nga?y. Nghi? ng?i ?? t? th? n??m [place]??c ???ng th???ng t?t h?n la? ng?i nghi?. ? Khi quy? vi? nghi? ng?i trong th??i gian da?i, xem l?n va?i hoa?t ??ng nhe? nha?ng Ciesla??c ke?o c?ng gi??a ca?c giai ?oa?n nghi?. Vi?c na?y se? giu?p tra?nh c??ng kh?p va? ?au. ? Tra?nh ng?i trong th??i gian da?i khng c?? ??ng. ??ng d?y va? ?i la?i i?t nh?t m?i gi?? m?t l?n.  T?p th? d?c va? ke?o c?ng th??ng xuyn theo ch? d?n c?a chuyn gia ch?m [place] s?c kh?e.  Khng nng b?t k? v?t g n?ng h?n 10 lb (4,5 kg) trong khi quy? vi? co? ca?c tri?u ch??ng ?au th?n kinh to?a. Khi khng co? tri?u ch??ng, quy? vi? v?n c?n tra?nh nng v?t n??ng, ???c bi?t la? nng v?t n??ng l??p ?i l??p la?i.  Khi quy? vi? nng v?t n??ng, ha?y lun s?? du?ng ky? thu?t nng phu? h??p, bao g?m: ? Cong ??u g?i. ? Gi?? cho v?t n??ng g?n c? th? quy? vi?. ? Tra?nh v??n ng???i. H??ng d?n chung  S?? du?ng t? th? ?u?ng. ? Tra?nh cu?i ng???i v? phi?a tr???c trong khi ng?i. ? Tra?nh cu?i ng???i v? phi?a tr???c trong khi ???ng.  Duy tr m??c cn n?ng c l?i cho s?c kh?e. Cn n??ng qua? m??c ta?o thm a?p l??c cho l?ng quy? vi? va? la?m kho? kh?n trong vi?c duy tri? t? th? t?t.  ?i gia?y h? tr??, thoa?i ma?i. Tra?nh ?i gia?y cao go?t.  Tra?nh ngu? trn ??m qua? m?m Dolinski??c qua? c??ng.  ??m ?u? c??ng ?? nng ??? l?ng quy? vi? khi quy? vi? ngu? co? th? giu?p lm gia?m ?au.  Tun th? t?t c? cc l?n khm theo di theo ch? d?n c?a chuyn gia ch?m [place] s?c kh?e. ?i?u ny c vai tr quan tr?ng. Hy lin l?c v?i chuyn gia ch?m [place] s?c kh?e n?u:  Quy? vi? bi? ?au la?m quy? vi? th??c d?y khi ngu?Floyde Parkins? vi? bi? ?au va? ?au tr?m tr?ng h?n khi n??m xu?ng.  C?n ?au cu?a quy? vi? tr?m tr?ng h?n c?n ?au ?a? t??ng bi? tr???c ?y.  C?n ?au cu?a quy? vi? ke?o da?i h?n 4 tu?n.  Qu v? b? s?t cn khng r nguyn nhn. Yu c?u tr? gip ngay l?p t?c n?u:  Qu v? m?t ki?m sot vi?c ??i ti?n v ti?u ti?n (khng t?? chu?).  Qu v? bi?: ? Y?u ?? th??t l?ng, khung ch?u, mng [place]??c chn tr?? nn tr?m tr?ng h?n. ? ?o? Ponti??c s?ng ?? l?ng. ? Ca?m gia?c no?ng ra?t khi ?i ti?u. Thng tin ny khng nh?m m?c ?ch thay th? cho l?i khuyn m chuyn gia ch?m [place] s?c kh?e ni v?i qu v?. Hy b?o ??m qu v? ph?i th?o lu?n b?t k? v?n ?? g m qu v? c v?i chuyn gia ch?m  s?c kh?e c?a qu v?. Document Released: 07/16/2011 Document Revised: 05/02/2016 Document Reviewed: 09/23/2014 Elsevier Interactive Patient Education  2019 Elsevier Inc.      Agustina Caroli, MD [facility]

## 2018-03-03 ENCOUNTER — Ambulatory Visit (INDEPENDENT_AMBULATORY_CARE_PROVIDER_SITE_OTHER): Payer: Medicare Other | Admitting: Orthopaedic Surgery

## 2018-03-03 ENCOUNTER — Encounter (INDEPENDENT_AMBULATORY_CARE_PROVIDER_SITE_OTHER): Payer: Self-pay | Admitting: Orthopaedic Surgery

## 2018-03-03 VITALS — Ht 62.0 in | Wt 164.0 lb

## 2018-03-03 DIAGNOSIS — Z96651 Presence of right artificial knee joint: Secondary | ICD-10-CM | POA: Diagnosis not present

## 2018-03-03 MED ORDER — DESONIDE 0.05 % EX CREA
TOPICAL_CREAM | Freq: Two times a day (BID) | CUTANEOUS | 6 refills | Status: DC
Start: 2018-03-03 — End: 2018-04-30

## 2018-03-03 NOTE — Addendum Note (Signed)
Addended by: Precious Bard on: 03/03/2018 01:07 PM   Modules accepted: Orders

## 2018-03-03 NOTE — Progress Notes (Signed)
Office Visit Note   Patient: Ariel Harris           Date of Birth: 06/06/1945           MRN: [id number] Visit Date: 03/03/2018              Requested by: Antonietta Jewel, MD [address] [place], [place] [address] PCP: Antonietta Jewel, MD   Assessment & Plan: Visit Diagnoses:  1. S/P TKR (total knee replacement), right     Plan: From my standpoint she is doing well from her knee replacement.  I again asked her if she was allergic to nickel which she denied.  At this point she I would like to refer her to a dermatologist for further evaluation of the dermatitis.  I would like to see her back in 2 months for her one-year visit with 2 view x-rays of the right knee.  Follow-Up Instructions: Return in about 2 months (around 05/02/2018).   Orders:  No orders of the defined types were placed in this encounter.  Meds ordered this encounter  Medications  . desonide (DESOWEN) 0.05 % cream    Sig: Apply topically 2 (two) times daily.    Dispense:  60 g    Refill:  6      Procedures: No procedures performed   Clinical Data: No additional findings.   Subjective: Chief Complaint  Patient presents with  . Right Knee - Follow-up    05/16/17 right total knee     Patient is 10 months status post right total knee replacement.  She is overall doing well and reports no pain.  She is very satisfied with her knee replacement.  She comes back with a persistent dermatitis across the anterolateral aspect of the proximal tibia.  She has been using desonide which does help temporarily.  She reports no problems with her knee.   Review of Systems   Objective: Vital Signs: Ht 5\' 2"  (1.575 m)   Wt 164 lb (74.4 kg)   BMI 30.00 kg/m   Physical Exam  Ortho Exam Her right knee exam shows a fully healed surgical scar.  She has excellent range of motion.  Stable to varus valgus.  She has a faint area of dermatitis which is itchy. Specialty Comments:  No specialty comments  available.  Imaging: No results found.   PMFS History: Patient Active Problem List   Diagnosis Date Noted  . Unilateral primary osteoarthritis, left knee 10/28/2017  . Hyperlipidemia 05/25/2017  . Osteoporosis 05/25/2017  . S/P TKR (total knee replacement), right 05/16/2017  . Unilateral primary osteoarthritis, right knee 11/01/2016  . History of PSVT 01/13/2014  . Chest pain 03/28/2011  . Palpitations 03/12/2011  . Dyslipidemia 01/12/2010  . Hypertension 01/12/2010  . CHEST PAIN, ATYPICAL 01/12/2010   Past Medical History:  Diagnosis Date  . Arthritis    "knees" (05/16/2017)  . Chest pain   . Hyperlipidemia   . Hypertension   . PAC (premature atrial contraction)   . SVT (supraventricular tachycardia) (HCC)     Family History  Problem Relation Age of Onset  . Coronary artery disease Neg Hx   . Heart attack Neg Hx   . Breast cancer Neg Hx     Past Surgical History:  Procedure Laterality Date  . JOINT REPLACEMENT    . KNEE ARTHROSCOPY Left 1996  . KNEE SURGERY Right [phone]   "open surgery; because of the pain"  . 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  TOTAL KNEE ARTHROPLASTY Right 05/16/2017   Procedure: RIGHT TOTAL KNEE ARTHROPLASTY;  Surgeon: Leandrew Koyanagi, MD;  Location: [facility];  Service: Orthopedics;  Laterality: Right;   Social History   Occupational History  . Not on file  Tobacco Use  . Smoking status: Never Smoker  . Smokeless tobacco: Never Used  Substance and Sexual Activity  . Alcohol use: No  . Drug use: No  . Sexual activity: Not Currently

## 2018-04-09 ENCOUNTER — Other Ambulatory Visit (INDEPENDENT_AMBULATORY_CARE_PROVIDER_SITE_OTHER): Payer: Self-pay | Admitting: Physician Assistant

## 2018-04-10 ENCOUNTER — Other Ambulatory Visit (INDEPENDENT_AMBULATORY_CARE_PROVIDER_SITE_OTHER): Payer: Self-pay | Admitting: Physician Assistant

## 2018-04-14 ENCOUNTER — Other Ambulatory Visit (INDEPENDENT_AMBULATORY_CARE_PROVIDER_SITE_OTHER): Payer: Self-pay | Admitting: Physician Assistant

## 2018-04-29 ENCOUNTER — Telehealth (INDEPENDENT_AMBULATORY_CARE_PROVIDER_SITE_OTHER): Payer: Self-pay

## 2018-04-29 NOTE — Telephone Encounter (Signed)
Called patient and asked the screening questions.  Do you have now or have you had in the past 7 days a fever and/or chills? NO  Do you have now or have you had in the past 7 days a cough? NO  Do you have now or have you had in the last 7 days nausea, vomiting or abdominal pain? NO  Have you been exposed to anyone who has tested positive for COVID-19? NO  Have you or anyone who lives with you traveled within the last month? NO 

## 2018-04-30 ENCOUNTER — Ambulatory Visit (INDEPENDENT_AMBULATORY_CARE_PROVIDER_SITE_OTHER): Payer: Medicare Other | Admitting: Orthopaedic Surgery

## 2018-04-30 ENCOUNTER — Other Ambulatory Visit (INDEPENDENT_AMBULATORY_CARE_PROVIDER_SITE_OTHER): Payer: Self-pay | Admitting: Physician Assistant

## 2018-04-30 ENCOUNTER — Other Ambulatory Visit: Payer: Self-pay

## 2018-04-30 ENCOUNTER — Encounter (INDEPENDENT_AMBULATORY_CARE_PROVIDER_SITE_OTHER): Payer: Self-pay | Admitting: Orthopaedic Surgery

## 2018-04-30 ENCOUNTER — Ambulatory Visit (INDEPENDENT_AMBULATORY_CARE_PROVIDER_SITE_OTHER): Payer: Medicare Other

## 2018-04-30 DIAGNOSIS — Z96651 Presence of right artificial knee joint: Secondary | ICD-10-CM

## 2018-04-30 DIAGNOSIS — M79661 Pain in right lower leg: Secondary | ICD-10-CM | POA: Diagnosis not present

## 2018-04-30 MED ORDER — DESONIDE 0.05 % EX CREA: TOPICAL_CREAM | Freq: Two times a day (BID) | CUTANEOUS | 6 refills | Status: DC

## 2018-04-30 NOTE — Telephone Encounter (Signed)
Apply to affected area 1-2 times daily as needed.  Dispense one tube and give 2 refills

## 2018-04-30 NOTE — Addendum Note (Signed)
Addended by: Precious Bard on: 04/30/2018 02:15 PM   Modules accepted: Orders

## 2018-04-30 NOTE — Progress Notes (Signed)
Post-Op Visit Note   Patient: Ariel Harris           Date of Birth: [birth year]           MRN: [id number] Visit Date: 04/30/2018 PCP: Antonietta Jewel, MD   Assessment & Plan:  Chief Complaint:  Chief Complaint  Patient presents with  . Right Knee - Pain   Visit Diagnoses:  1. Status post total right knee replacement     Plan: Patient is a pleasant 73 year old female who presents our clinic today 1 year status post right total knee replacement back in April 2019.  She has been doing okay.  She has had itching to the proximal tibia and lateral knee since surgery.  She has not been seen by dermatology.  She has continue to use desonide and has noticed slight improvement over the past few months.  No fevers or chills.  She has been asked multiple times about possible nickel allergy which she has denied.  She is regained near full range of motion and strength.  Doing well in regards to the knee itself.  Examination of the right knee reveals a well-healed surgical incision without evidence of infection.  No evidence of a rash of any sort.  Stable valgus and varus stress.  Range of motion 0 to 100 degrees.  She is neurovascularly intact distally.  At this point, we will have her continue using the desonide.  I will refer her to dermatology.  She will follow-up with [place] in 1 years time for repeat evaluation and x-rays.  Follow-Up Instructions: Return in about 1 year (around 04/30/2019).   Orders:  Orders Placed This Encounter  Procedures  . XR KNEE 3 VIEW RIGHT   Meds ordered this encounter  Medications  . desonide (DESOWEN) 0.05 % cream    Sig: Apply topically 2 (two) times daily.    Dispense:  60 g    Refill:  6    Imaging: Xr Knee 3 View Right  Result Date: 04/30/2018 X-rays demonstrate stable alignment of the prosthesis without complication   PMFS History: Patient Active Problem List   Diagnosis Date Noted  . Unilateral primary osteoarthritis, left knee 10/28/2017  .  Hyperlipidemia 05/25/2017  . Osteoporosis 05/25/2017  . Status post total right knee replacement 05/16/2017  . Unilateral primary osteoarthritis, right knee 11/01/2016  . History of PSVT 01/13/2014  . Chest pain 03/28/2011  . Palpitations 03/12/2011  . Dyslipidemia 01/12/2010  . Hypertension 01/12/2010  . CHEST PAIN, ATYPICAL 01/12/2010   Past Medical History:  Diagnosis Date  . Arthritis    "knees" (05/16/2017)  . Chest pain   . Hyperlipidemia   . Hypertension   . PAC (premature atrial contraction)   . SVT (supraventricular tachycardia) (HCC)     Family History  Problem Relation Age of Onset  . Coronary artery disease Neg Hx   . Heart attack Neg Hx   . Breast cancer Neg Hx     Past Surgical History:  Procedure Laterality Date  . JOINT REPLACEMENT    . KNEE ARTHROSCOPY Left 1996  . KNEE SURGERY Right [phone]   "open surgery; because of the pain"  . 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 TOTAL KNEE ARTHROPLASTY Right 05/16/2017   Procedure: RIGHT TOTAL KNEE ARTHROPLASTY;  Surgeon: Leandrew Koyanagi, MD;  Location: [facility];  Service: Orthopedics;  Laterality: Right;   Social History   Occupational History  . Not on file  Tobacco Use  . Smoking status:  Never Smoker  . Smokeless tobacco: Never Used  Substance and Sexual Activity  . Alcohol use: No  . Drug use: No  . Sexual activity: Not Currently

## 2018-05-01 ENCOUNTER — Ambulatory Visit (INDEPENDENT_AMBULATORY_CARE_PROVIDER_SITE_OTHER): Payer: Medicare Other | Admitting: Orthopaedic Surgery

## 2018-05-12 ENCOUNTER — Ambulatory Visit (INDEPENDENT_AMBULATORY_CARE_PROVIDER_SITE_OTHER): Payer: Medicare Other | Admitting: Orthopaedic Surgery

## 2018-05-12 ENCOUNTER — Encounter (INDEPENDENT_AMBULATORY_CARE_PROVIDER_SITE_OTHER): Payer: Self-pay

## 2018-05-27 DIAGNOSIS — I8312 Varicose veins of left lower extremity with inflammation: Secondary | ICD-10-CM | POA: Diagnosis not present

## 2018-05-27 DIAGNOSIS — I8311 Varicose veins of right lower extremity with inflammation: Secondary | ICD-10-CM | POA: Diagnosis not present

## 2018-05-27 DIAGNOSIS — I872 Venous insufficiency (chronic) (peripheral): Secondary | ICD-10-CM | POA: Diagnosis not present

## 2018-06-23 ENCOUNTER — Other Ambulatory Visit: Payer: Self-pay | Admitting: Physician Assistant

## 2018-06-23 DIAGNOSIS — E785 Hyperlipidemia, unspecified: Secondary | ICD-10-CM

## 2018-06-25 ENCOUNTER — Other Ambulatory Visit: Payer: Self-pay | Admitting: Physician Assistant

## 2018-06-25 DIAGNOSIS — E785 Hyperlipidemia, unspecified: Secondary | ICD-10-CM

## 2018-06-25 NOTE — Telephone Encounter (Signed)
Patient called, no voicemail set up, no answer. Will need an appointment with a new provider at the office.

## 2018-06-25 NOTE — Telephone Encounter (Signed)
Requested medication (s) are due for refill today: Yes  Requested medication (s) are on the active medication list: Yes  Last refill:  07/08/17  Future visit scheduled: No  Notes to clinic:  Unable to refill, last refilled by another provider, appointment needed to transfer care.     Requested Prescriptions  Pending Prescriptions Disp Refills   pravastatin (PRAVACHOL) 40 MG tablet [Pharmacy Med Name: PRAVASTATIN SODIUM 40 MG TAB] 90 tablet 3    Sig: TAKE 1 TABLET BY MOUTH EVERY DAY     Cardiovascular:  Antilipid - Statins Failed - 06/25/2018 11:40 AM      Failed - Total Cholesterol in normal range and within 360 days    Cholesterol, Total  Date Value Ref Range Status  04/09/2017 202 (H) 100 - 199 mg/dL Final         Failed - LDL in normal range and within 360 days    LDL Calculated  Date Value Ref Range Status  04/09/2017 118 (H) 0 - 99 mg/dL Final         Failed - HDL in normal range and within 360 days    HDL  Date Value Ref Range Status  04/09/2017 70 >39 mg/dL Final         Failed - Triglycerides in normal range and within 360 days    Triglycerides  Date Value Ref Range Status  04/09/2017 69 0 - 149 mg/dL Final         Passed - Patient is not pregnant      Passed - Valid encounter within last 12 months    Recent Outpatient Visits          4 months ago Acute right-sided low back pain with right-sided sciatica   [facility] at [facility], Ines Bloomer, MD   6 months ago Cough   [facility] at [facility], Ranell Patrick, MD   6 months ago Visit for wound check   [facility] at [facility], [name] Mink, PA-C   7 months ago Cellulitis and abscess of leg   [facility] at [facility], Ranell Patrick, MD   7 months ago Cellulitis and abscess of leg   Primary [facility] at [facility], [name] Mink, PA-C      Future Appointments            In 10 months Erlinda Hong, Marylynn Pearson, MD [facility]

## 2018-07-09 ENCOUNTER — Ambulatory Visit (INDEPENDENT_AMBULATORY_CARE_PROVIDER_SITE_OTHER): Payer: Medicare Other | Admitting: Family Medicine

## 2018-07-09 ENCOUNTER — Telehealth: Payer: Self-pay

## 2018-07-09 ENCOUNTER — Encounter: Payer: Self-pay | Admitting: Family Medicine

## 2018-07-09 ENCOUNTER — Other Ambulatory Visit: Payer: Self-pay

## 2018-07-09 VITALS — BP 154/79 | HR 69 | Temp 98.3°F | Ht 63.0 in | Wt 155.4 lb

## 2018-07-09 DIAGNOSIS — E785 Hyperlipidemia, unspecified: Secondary | ICD-10-CM | POA: Diagnosis not present

## 2018-07-09 DIAGNOSIS — I471 Supraventricular tachycardia, unspecified: Secondary | ICD-10-CM | POA: Insufficient documentation

## 2018-07-09 DIAGNOSIS — I10 Essential (primary) hypertension: Secondary | ICD-10-CM

## 2018-07-09 LAB — POCT URINALYSIS DIP (MANUAL ENTRY)
Bilirubin, UA: NEGATIVE
Glucose, UA: NEGATIVE mg/dL
Ketones, POC UA: NEGATIVE mg/dL
Nitrite, UA: NEGATIVE
Protein Ur, POC: NEGATIVE mg/dL
Spec Grav, UA: >=1.03 — AB (ref 1.010–1.025)
Urobilinogen, UA: 0.2 E.U./dL
pH, UA: 5 (ref 5.0–8.0)

## 2018-07-09 MED ORDER — PRAVASTATIN SODIUM 40 MG PO TABS: 40.0000 mg | ORAL_TABLET | Freq: Every day | ORAL | 3 refills | Status: DC

## 2018-07-09 MED ORDER — METOPROLOL SUCCINATE ER 25 MG PO TB24: ORAL_TABLET | ORAL | 1 refills | Status: DC

## 2018-07-09 NOTE — Telephone Encounter (Signed)
Called pt and informed her of the urine results via verbal order from provider. She needs to drink more water for she was dehydrated with some blood in urine.

## 2018-07-09 NOTE — Progress Notes (Signed)
Established Patient Office Visit  Subjective:  Patient ID: Ariel Harris, female    DOB: 06/17/1945  Age: 73 y.o. MRN: [id number]  CC:  Chief Complaint  Patient presents with  . Medication Refill    pravastatin and Metoprolol    HPI Ariel Harris presents for HTN medication refills. Pt states she is doing well but needs her medication No chest pain, no sob, no headaches , no le edema. Pt states she had her right knee replaced and is feeling better.   Past Medical History:  Diagnosis Date  . Arthritis    "knees" (05/16/2017)  . Chest pain   . Hyperlipidemia   . Hypertension   . PAC (premature atrial contraction)   . SVT (supraventricular tachycardia) (HCC)     Past Surgical History:  Procedure Laterality Date  . JOINT REPLACEMENT    . KNEE ARTHROSCOPY Left 1996  . KNEE SURGERY Right [phone]   "open surgery; because of the pain"  . 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 TOTAL KNEE ARTHROPLASTY Right 05/16/2017   Procedure: RIGHT TOTAL KNEE ARTHROPLASTY;  Surgeon: Leandrew Koyanagi, MD;  Location: [place];  Service: Orthopedics;  Laterality: Right;    Family History  Problem Relation Age of Onset  . Coronary artery disease Neg Hx   . Heart attack Neg Hx   . Breast cancer Neg Hx     Social History   Socioeconomic History  . Marital status: Divorced    Spouse name: Not on file  . Number of children: 2  . Years of education: Not on file  . Highest education level: Not on file  Occupational History  . Not on file  Social Needs  . Financial resource strain: Not on file  . Food insecurity    Worry: Not on file    Inability: Not on file  . Transportation needs    Medical: Not on file    Non-medical: Not on file  Tobacco Use  . Smoking status: Never Smoker  . Smokeless tobacco: Never Used  Substance and Sexual Activity  . Alcohol use: No  . Drug use: No  . Sexual activity: Not Currently  Lifestyle  . Physical activity    Days per week: Not on file    Minutes per session: Not on file  . Stress: Not on file  Relationships  . Social Herbalist on phone: Not on file    Gets together: Not on file    Attends religious service: Not on file    Active member of club or organization: Not on file    Attends meetings of clubs or organizations: Not on file    Relationship status: Not on file  . Intimate partner violence    Fear of current or ex partner: Not on file    Emotionally abused: Not on file    Physically abused: Not on file    Forced sexual activity: Not on file  Other Topics Concern  . Not on file  Social History Narrative   Negative family `history--Specifically artery disease or MI. The patient has two siblings died at a young age from fevers.   The patient is separated and single. She is retired but used to work at a [employer] and a [place]. She is independent of daily living. She has never drink or smoke or done drugs    Outpatient Medications Prior to Visit  Medication Sig Dispense Refill  . betamethasone valerate (VALISONE) 0.1 %  cream Apply to affected area 1-2 times daily as needed. 30 g 2  . fluticasone (CUTIVATE) 0.05 % cream Please specify directions, refills and quantity 1 g 0  . metoprolol succinate (TOPROL XL) 25 MG 24 hr tablet Take 1 tablet by mouth daily, may take 1 extra tablet by mouth daily only as needed for palpitations 150 tablet 3  . pravastatin (PRAVACHOL) 40 MG tablet Take 1 tablet (40 mg total) by mouth daily. 90 tablet 3   Facility-Administered Medications Prior to Visit  Medication Dose Route Frequency Provider Last Rate Last Dose  . methylPREDNISolone acetate (DEPO-MEDROL) injection 40 mg  40 mg Intra-articular Once Hilts, Legrand Como, MD        Allergies  Allergen Reactions  . Indomethacin Swelling  . Tramadol     Sweaty, made feel bad    ROS Review of Systems  Constitutional: Negative for fatigue.  Respiratory: Negative for shortness of breath.   Cardiovascular: Negative for chest  pain and leg swelling.      Objective:    Physical Exam  Constitutional: She appears well-developed and well-nourished.  HENT:  Head: Normocephalic and atraumatic.  Right Ear: External ear normal.  Left Ear: External ear normal.  Eyes: Conjunctivae are normal.  Neck: Normal range of motion. Neck supple.  Cardiovascular: Normal rate and regular rhythm.  Pulmonary/Chest: Effort normal and breath sounds normal.  Musculoskeletal:        General: No edema.    BP (!) 154/79 (BP Location: Right Arm, Patient Position: Sitting, Cuff Size: Normal)   Pulse 69   Temp 98.3 F (36.8 C) (Oral)   Ht _0  (1.6 m)   Wt 155 lb 6.4 oz (70.5 kg)   SpO2 95%   BMI 27.53 kg/m  Wt Readings from Last 3 Encounters:  07/09/18 155 lb 6.4 oz (70.5 kg)  03/03/18 164 lb (74.4 kg)  02/12/18 164 lb 9.6 oz (74.7 kg)     Health Maintenance Due  Topic Date Due  . Hepatitis C Screening  01/17/48  . TETANUS/TDAP  03/12/1966  . COLONOSCOPY  03/12/1997  . PNA vac Low Risk Adult (2 of 2 - PPSV23) 12/22/2017    There are no preventive care reminders to display for this patient.  Lab Results  Component Value Date   TSH 0.65 12/09/2012   Lab Results  Component Value Date   WBC 4.3 (A) 07/15/2017   HGB 13.7 07/15/2017   HCT 42.6 07/15/2017   MCV 87.9 07/15/2017   PLT 170 05/17/2017   Lab Results  Component Value Date   NA 140 07/15/2017   K 4.0 07/15/2017   CO2 24 07/15/2017   GLUCOSE 94 07/15/2017   BUN 14 07/15/2017   CREATININE 0.80 07/15/2017   BILITOT 1.0 07/15/2017   ALKPHOS 35 (L) 07/15/2017   AST 21 07/15/2017   ALT 17 07/15/2017   PROT 7.4 07/15/2017   ALBUMIN 4.1 07/15/2017   CALCIUM 9.6 07/15/2017   ANIONGAP 7 05/17/2017   GFR 71.18 12/09/2012   Lab Results  Component Value Date   CHOL 202 (H) 04/09/2017   Lab Results  Component Value Date   HDL 70 04/09/2017   Lab Results  Component Value Date   LDLCALC 118 (H) 04/09/2017   Lab Results  Component Value Date    TRIG 69 04/09/2017   Lab Results  Component Value Date   CHOLHDL 2.9 04/09/2017   Lab Results  Component Value Date   HGBA1C (H) 11/23/2009    6.0 (NOTE)  According to the ADA Clinical Practice Recommendations for 2011, when HbA1c is used as a screening test:   >=6.5%   Diagnostic of Diabetes Mellitus           (if abnormal result  is confirmed)  5.7-6.4%   Increased risk of developing Diabetes Mellitus  References:Diagnosis and Classification of Diabetes Mellitus,Diabetes JLTH,9957,90(ILUYY 1):S62-S69 and Standards of Medical Care in         Diabetes - 2011,Diabetes YHHT,3012,37  (Suppl 1):S11-S61.      Assessment & Plan:   Problem List Items Addressed This Visit      Cardiovascular and Mediastinum   Hypertension - Primary   Relevant Orders   POCT urinalysis dipstick   CMP14+EGFR   Lipid panel     Other   Hyperlipidemia   Relevant Orders   CMP14+EGFR   Lipid panel         LISA Hannah Beat, MD

## 2018-07-09 NOTE — Addendum Note (Signed)
Addended by: Kittie Plater, Dearl Rudden HUA on: 07/09/2018 12:17 PM   Modules accepted: Orders

## 2018-07-09 NOTE — Patient Instructions (Addendum)
   Pt to come fasting tomorrow for blood work If you have lab work done today you will be contacted with your lab results within the next 2 weeks.  If you have not heard from [place] then please contact us. The fastest way to get your results is to register for My Chart.   IF you received an x-ray today, you will receive an invoice from [facility], [facility], [facility]. Please contact [facility] at [phone] with questions or concerns regarding your invoice.   IF you received labwork today, you will receive an invoice from [place]. Please contact [facility] at [phone] with questions or concerns regarding your invoice.   Our billing staff will not be able to assist you with questions regarding bills from these companies.  You will be contacted with the lab results as soon as they are available. The fastest way to get your results is to activate your My Chart account. Instructions are located on the last page of this paperwork. If you have not heard from [place] regarding the results in 2 weeks, please contact this office.

## 2018-07-10 ENCOUNTER — Ambulatory Visit (INDEPENDENT_AMBULATORY_CARE_PROVIDER_SITE_OTHER): Payer: Medicare Other | Admitting: Family Medicine

## 2018-07-10 DIAGNOSIS — E785 Hyperlipidemia, unspecified: Secondary | ICD-10-CM

## 2018-07-10 DIAGNOSIS — I10 Essential (primary) hypertension: Secondary | ICD-10-CM

## 2018-07-10 LAB — POCT URINALYSIS DIP (MANUAL ENTRY)
Bilirubin, UA: NEGATIVE
Blood, UA: NEGATIVE
Glucose, UA: NEGATIVE mg/dL
Ketones, POC UA: NEGATIVE mg/dL
Leukocytes, UA: NEGATIVE
Nitrite, UA: NEGATIVE
Protein Ur, POC: NEGATIVE mg/dL
Spec Grav, UA: <=1.005 — AB (ref 1.010–1.025)
Urobilinogen, UA: 0.2 E.U./dL
pH, UA: 6.5 (ref 5.0–8.0)

## 2018-07-11 LAB — CMP14+EGFR
ALT: 12 IU/L (ref 0–32)
AST: 20 IU/L (ref 0–40)
Albumin/Globulin Ratio: 1.3 (ref 1.2–2.2)
Albumin: 4.1 g/dL (ref 3.7–4.7)
Alkaline Phosphatase: 36 IU/L — ABNORMAL LOW (ref 39–117)
BUN/Creatinine Ratio: 15 (ref 12–28)
BUN: 12 mg/dL (ref 8–27)
Bilirubin Total: 1.1 mg/dL (ref 0.0–1.2)
CO2: 23 mmol/L (ref 20–29)
Calcium: 9 mg/dL (ref 8.7–10.3)
Chloride: 103 mmol/L (ref 96–106)
Creatinine, Ser: 0.81 mg/dL (ref 0.57–1.00)
GFR calc Af Amer: 85 mL/min/{1.73_m2} (ref >59)
GFR calc non Af Amer: 73 mL/min/{1.73_m2} (ref >59)
Globulin, Total: 3.2 g/dL (ref 1.5–4.5)
Glucose: 99 mg/dL (ref 65–99)
Potassium: 4.3 mmol/L (ref 3.5–5.2)
Sodium: 140 mmol/L (ref 134–144)
Total Protein: 7.3 g/dL (ref 6.0–8.5)

## 2018-07-11 LAB — LIPID PANEL
Chol/HDL Ratio: 2.9 ratio (ref 0.0–4.4)
Cholesterol, Total: 190 mg/dL (ref 100–199)
HDL: 66 mg/dL (ref >39)
LDL Calculated: 111 mg/dL — ABNORMAL HIGH (ref 0–99)
Triglycerides: 67 mg/dL (ref 0–149)
VLDL Cholesterol Cal: 13 mg/dL (ref 5–40)

## 2018-07-22 ENCOUNTER — Telehealth: Payer: Self-pay | Admitting: Registered Nurse

## 2018-07-22 NOTE — Telephone Encounter (Signed)
Spoke with pt to confirm appt will start at 9:10.

## 2018-08-07 IMAGING — DX DG KNEE COMPLETE 4+V*R*
4 series · 4 of 4 positions shown · non-contrast
Comparison: None.

CLINICAL DATA: Left knee pain.

EXAM:
RIGHT KNEE - COMPLETE 4+ VIEW

[knee ap]
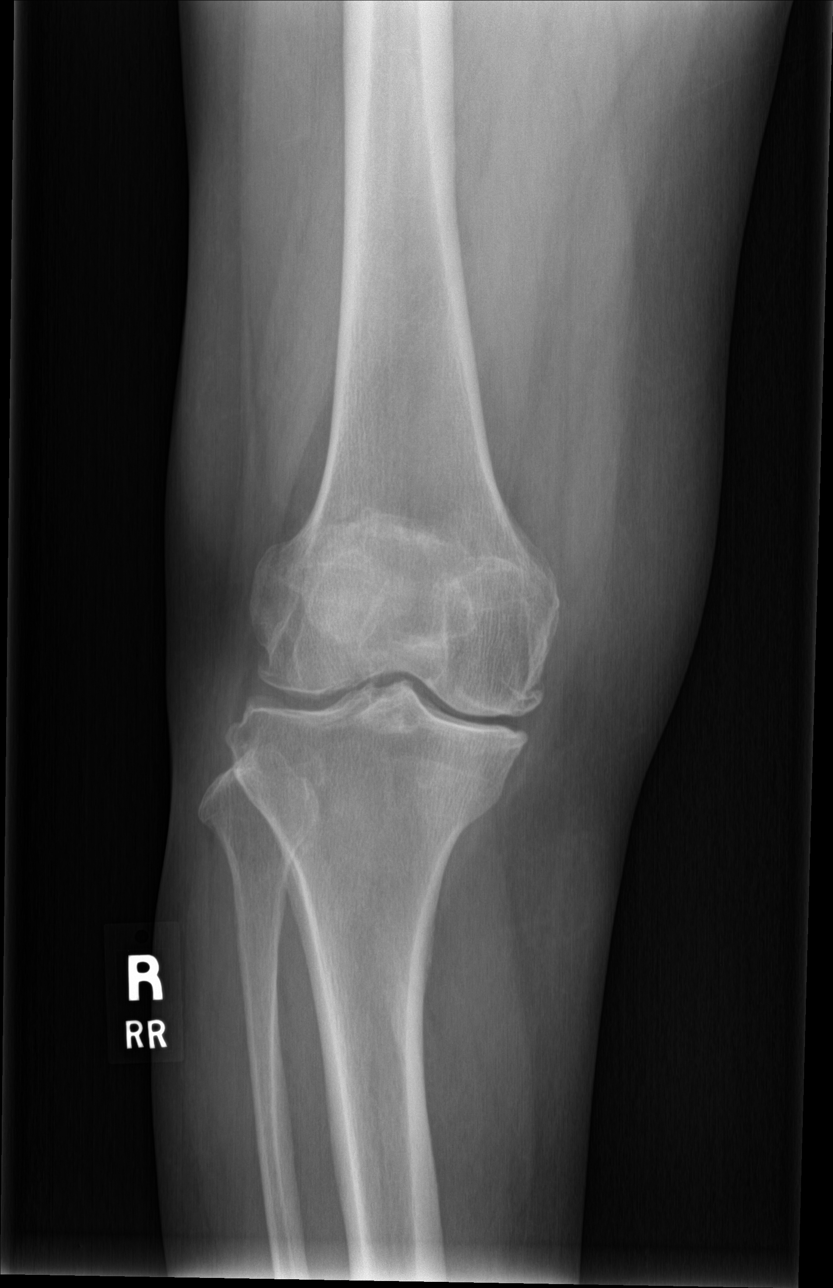

[knee obl (1 of 2)]
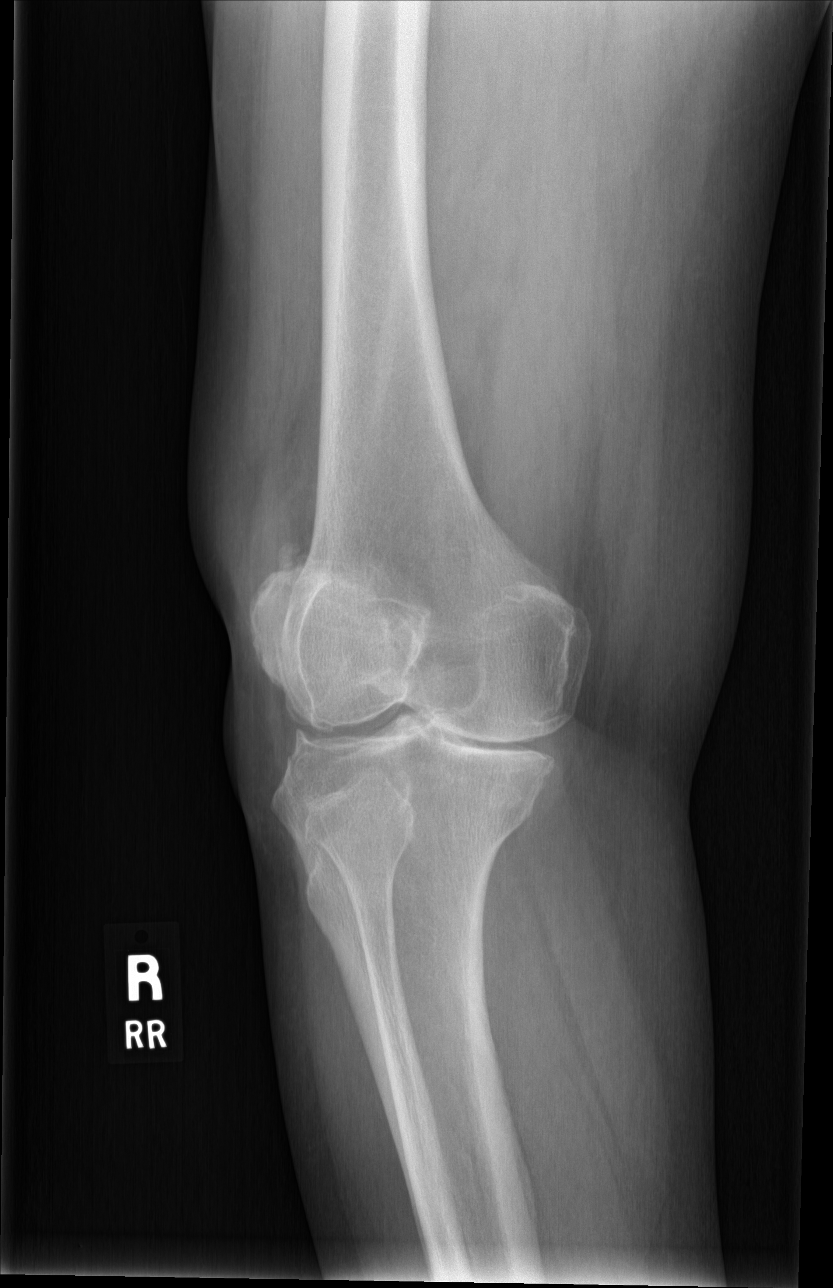

[knee obl (2 of 2)]
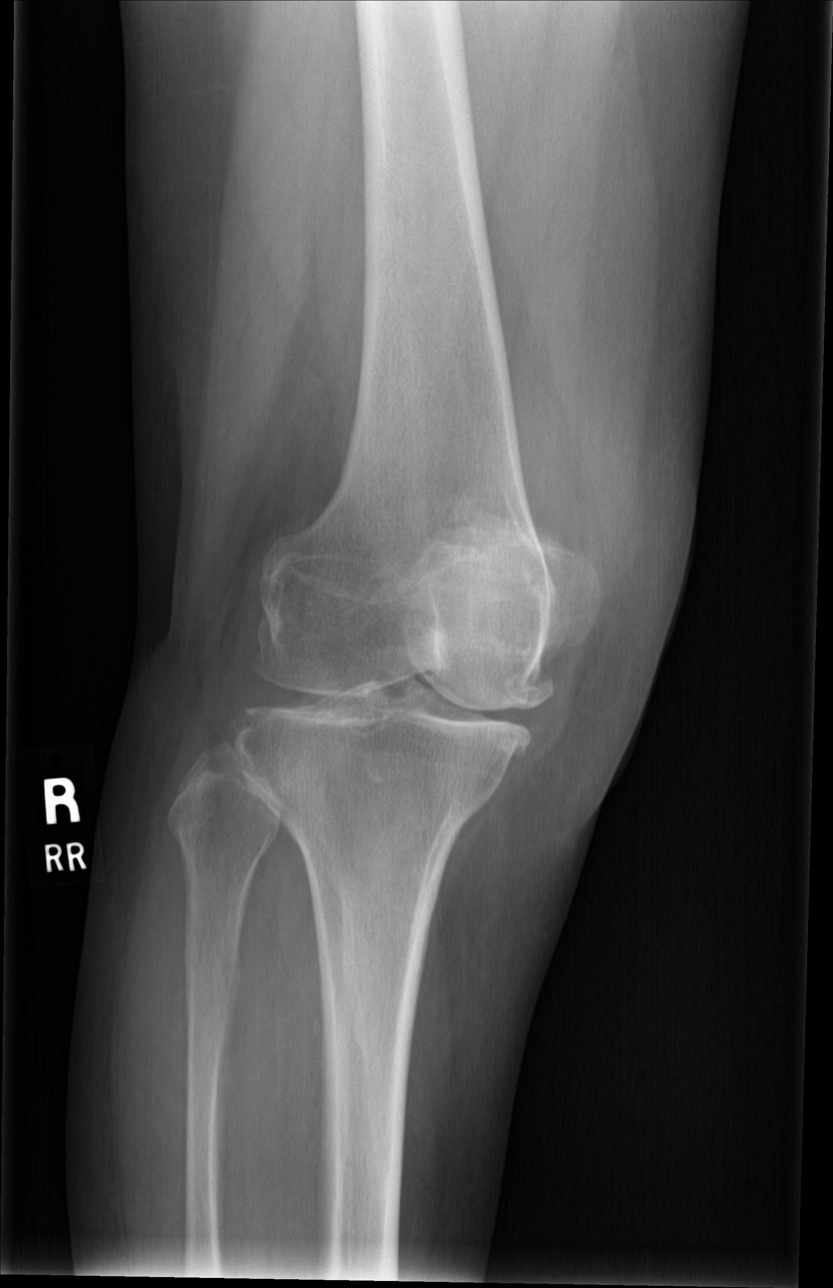

[knee lat]
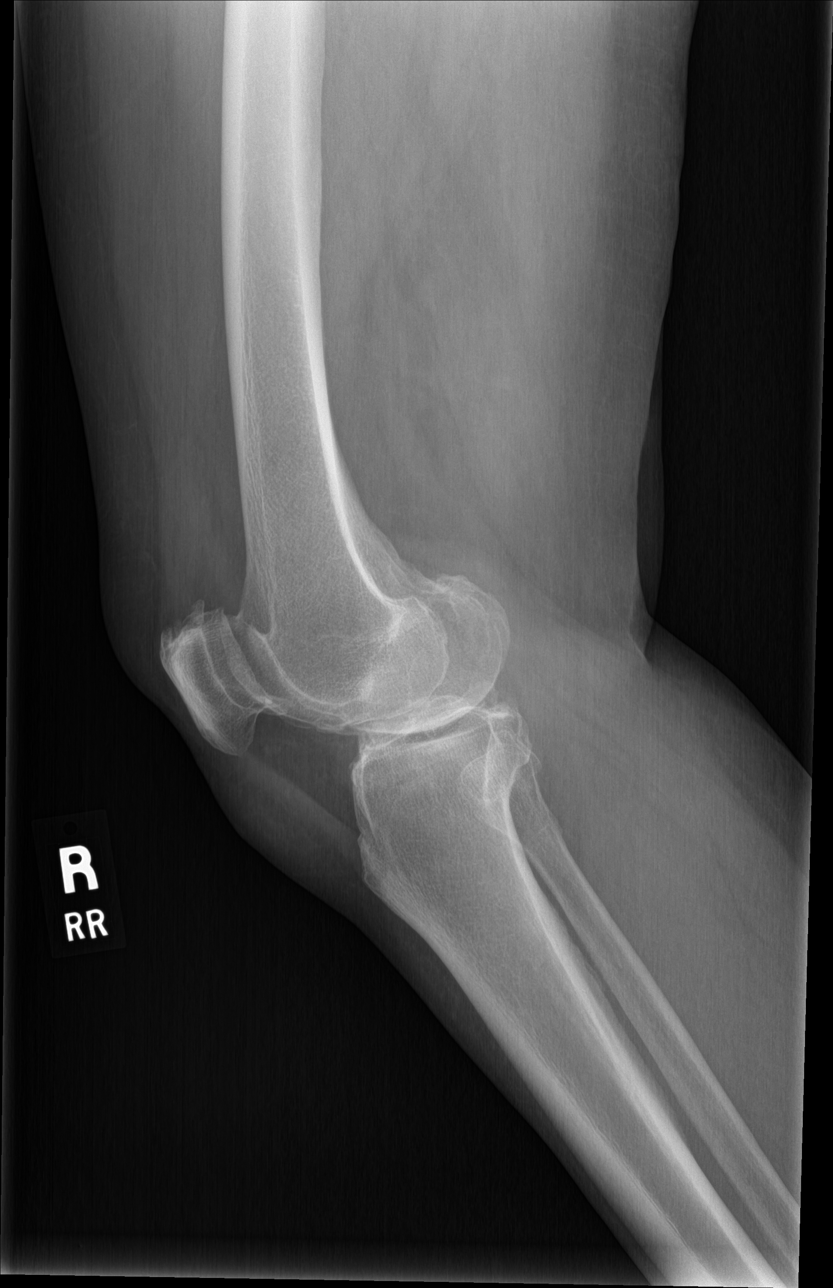

[4 of 4 positions shown; findings below may reference images not displayed]

FINDINGS: There is moderately severe tricompartmental osteoarthritis, most
prominent in the medial and patellofemoral compartments. Small joint
effusion. No fracture or bone destruction.
IMPRESSION: Moderately severe osteoarthritis of the right knee.

## 2018-08-24 ENCOUNTER — Other Ambulatory Visit: Payer: Self-pay | Admitting: Internal Medicine

## 2018-08-24 DIAGNOSIS — Z1231 Encounter for screening mammogram for malignant neoplasm of breast: Secondary | ICD-10-CM

## 2018-08-27 DIAGNOSIS — M79672 Pain in left foot: Secondary | ICD-10-CM | POA: Diagnosis not present

## 2018-08-27 DIAGNOSIS — B079 Viral wart, unspecified: Secondary | ICD-10-CM | POA: Diagnosis not present

## 2018-08-27 DIAGNOSIS — M21622 Bunionette of left foot: Secondary | ICD-10-CM | POA: Diagnosis not present

## 2018-09-10 DIAGNOSIS — B079 Viral wart, unspecified: Secondary | ICD-10-CM | POA: Diagnosis not present

## 2018-09-10 DIAGNOSIS — Z23 Encounter for immunization: Secondary | ICD-10-CM | POA: Diagnosis not present

## 2018-10-09 ENCOUNTER — Other Ambulatory Visit: Payer: Self-pay

## 2018-10-09 ENCOUNTER — Ambulatory Visit
Admission: RE | Admit: 2018-10-09 | Discharge: 2018-10-09 | Disposition: A | Discharge status: Home / Self Care | Payer: Medicare Other | Source: Ambulatory Visit | Attending: Internal Medicine | Admitting: Internal Medicine

## 2018-10-09 DIAGNOSIS — Z1231 Encounter for screening mammogram for malignant neoplasm of breast: Secondary | ICD-10-CM

## 2018-10-27 ENCOUNTER — Ambulatory Visit: Payer: Self-pay

## 2018-10-27 ENCOUNTER — Ambulatory Visit (INDEPENDENT_AMBULATORY_CARE_PROVIDER_SITE_OTHER): Payer: Medicare Other | Admitting: Orthopaedic Surgery

## 2018-10-27 ENCOUNTER — Encounter: Payer: Self-pay | Admitting: Orthopaedic Surgery

## 2018-10-27 DIAGNOSIS — M1712 Unilateral primary osteoarthritis, left knee: Secondary | ICD-10-CM

## 2018-10-27 NOTE — Progress Notes (Signed)
Office Visit Note   Patient: Ariel Harris           Date of Birth: 08/02/1945           MRN: [id number] Visit Date: 10/27/2018              Requested by: Antonietta Jewel, MD [address] [place],  [place] [address] PCP: Antonietta Jewel, MD   Assessment & Plan: Visit Diagnoses:  1. Unilateral primary osteoarthritis, left knee     Plan: Impression is end-stage left knee DJD.  Patient is not interested in cortisone injections because they are temporary.  She did very well from her right knee replacement and would like to have the left knee replaced ASAP as she is severely limited by her left knee.  She would like to have this done in the near future.  She had no postoperative issues or complications with the right knee replacement.  Again risks and possible complications reviewed with the patient.  Interpreter was present today.  Questions encouraged and answered.  Follow-Up Instructions: Return for 2 week postop visit.   Orders:  Orders Placed This Encounter  Procedures  . XR KNEE 3 VIEW LEFT   No orders of the defined types were placed in this encounter.     Procedures: No procedures performed   Clinical Data: No additional findings.   Subjective: Chief Complaint  Patient presents with  . Left Knee - Pain    Patient is a very pleasant 73 year old female comes in for evaluation of left knee pain that has progressively gotten worse over the last month.  She endorses pain that is reminiscent to her right knee pain.  She did very well from her right total knee replacement about 17 months ago and she is very happy with this.  She states that she is very limited by her left knee because she is not able to do much because it swells and throbs with activity.  She is not interested in cortisone injections as she understands this is only temporary.   Review of Systems  Constitutional: Negative.   HENT: Negative.   Eyes: Negative.   Respiratory: Negative.   Cardiovascular:  Negative.   Endocrine: Negative.   Musculoskeletal: Negative.   Neurological: Negative.   Hematological: Negative.   Psychiatric/Behavioral: Negative.   All other systems reviewed and are negative.    Objective: Vital Signs: There were no vitals taken for this visit.  Physical Exam Vitals signs and nursing note reviewed.  Constitutional:      Appearance: She is well-developed.  Pulmonary:     Effort: Pulmonary effort is normal.  Skin:    General: Skin is warm.     Capillary Refill: Capillary refill takes less than 2 seconds.  Neurological:     Mental Status: She is alert and oriented to person, place, and time.  Psychiatric:        Behavior: Behavior normal.        Thought Content: Thought content normal.        Judgment: Judgment normal.     Ortho Exam Left knee exam shows no joint effusion.  She has a mild flexion contracture.  Mild varus deformity. Specialty Comments:  No specialty comments available.  Imaging: Xr Knee 3 View Left  Result Date: 10/27/2018 Advanced tricompartmental degenerative joint disease with significant joint space narrowing    PMFS History: Patient Active Problem List   Diagnosis Date Noted  . SVT (supraventricular tachycardia) ([facility]) 07/09/2018  .  Unilateral primary osteoarthritis, left knee 10/28/2017  . Hyperlipidemia 05/25/2017  . Osteoporosis 05/25/2017  . Status post total right knee replacement 05/16/2017  . Unilateral primary osteoarthritis, right knee 11/01/2016  . History of PSVT 01/13/2014  . Chest pain 03/28/2011  . Palpitations 03/12/2011  . Dyslipidemia 01/12/2010  . Hypertension 01/12/2010  . CHEST PAIN, ATYPICAL 01/12/2010   Past Medical History:  Diagnosis Date  . Arthritis    "knees" (05/16/2017)  . Chest pain   . Hyperlipidemia   . Hypertension   . PAC (premature atrial contraction)   . SVT (supraventricular tachycardia) (HCC)     Family History  Problem Relation Age of Onset  . Coronary artery disease  Neg Hx   . Heart attack Neg Hx   . Breast cancer Neg Hx     Past Surgical History:  Procedure Laterality Date  . JOINT REPLACEMENT    . KNEE ARTHROSCOPY Left 1996  . KNEE SURGERY Right [phone]   "open surgery; because of the pain"  . 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 TOTAL KNEE ARTHROPLASTY Right 05/16/2017   Procedure: RIGHT TOTAL KNEE ARTHROPLASTY;  Surgeon: Leandrew Koyanagi, MD;  Location: [place];  Service: Orthopedics;  Laterality: Right;   Social History   Occupational History  . Not on file  Tobacco Use  . Smoking status: Never Smoker  . Smokeless tobacco: Never Used  Substance and Sexual Activity  . Alcohol use: No  . Drug use: No  . Sexual activity: Not Currently

## 2018-11-04 DIAGNOSIS — B079 Viral wart, unspecified: Secondary | ICD-10-CM | POA: Diagnosis not present

## 2018-11-10 NOTE — Progress Notes (Signed)
[id number] [address], [place] [address] Phone: [phone] Fax: [phone] [place], [place] [id number] [place] [id number] Phone: [phone] Fax: [phone]      Your procedure is scheduled on Monday, October 19th, 2020.  Report to [facility] Main Entrance "A" at 5:30 A.M., and check in at the Admitting office.   Call this number if you have problems the morning of surgery:  [phone]  Call [phone] if you have any questions prior to your surgery date Monday-Friday 8am-4pm    Remember:  Do not eat after midnight the night before your surgery  You may drink clear liquids until 4:30AM the morning of your surgery.   Clear liquids allowed are: Water, Non-Citrus Juices (without pulp), Carbonated Beverages, Clear Tea, Black Coffee Only, and Gatorade  Please complete your PRE-SURGERY ENSURE that was provided to you by 4:30AM the morning of surgery.  Please, if able, drink it in one setting. DO NOT SIP.     Take these medicines the morning of surgery with A SIP OF WATER :  Metoprolol Succinate (Toprol XL)   7 days prior to surgery STOP taking any Aspirin (unless otherwise instructed by your surgeon), Aleve, Naproxen, Ibuprofen, Motrin, Advil, Goody's, BC's, all herbal medications, fish oil, and all vitamins.    The Morning of Surgery  Do not wear jewelry, make-up or nail polish.  Do not wear lotions, powders, or perfumes/colognes, or deodorant  Do not shave 48 hours prior to surgery.  Men may shave face and neck.  Do not bring valuables to the hospital.  [facility] is not responsible for any belongings or valuables.  If you are a smoker, DO NOT Smoke 24 hours prior to surgery IF you wear a CPAP at night please bring your mask, tubing, and machine the morning of surgery   Remember that you must have someone to transport  you home after your surgery, and remain with you for 24 hours if you are discharged the same day.   Contacts, glasses, hearing aids, dentures or bridgework may not be worn into surgery.    Leave your suitcase in the car.  After surgery it may be brought to your room.  For patients admitted to the hospital, discharge time will be determined by your treatment team.  Patients discharged the day of surgery will not be allowed to drive home.    Special instructions:   [place]- Preparing For Surgery  Before surgery, you can play an important role. Because skin is not sterile, your skin needs to be as free of germs as possible. You can reduce the number of germs on your skin by washing with CHG (chlorahexidine gluconate) Soap before surgery.  CHG is an antiseptic cleaner which kills germs and bonds with the skin to continue killing germs even after washing.    Oral Hygiene is also important to reduce your risk of infection.  Remember - BRUSH YOUR TEETH THE MORNING OF SURGERY WITH YOUR REGULAR TOOTHPASTE  Please do not use if you have an allergy to CHG or antibacterial soaps. If your skin becomes reddened/irritated stop using the CHG.  Do not shave (including legs and underarms) for at least 48 hours prior to first CHG shower. It is OK to shave your face.  Please follow these instructions carefully.   1. Shower the NIGHT BEFORE SURGERY and the  MORNING OF SURGERY with CHG Soap.   2. If you chose to wash your hair, wash your hair first as usual with your normal shampoo.  3. After you shampoo, rinse your hair and body thoroughly to remove the shampoo.  4. Use CHG as you would any other liquid soap. You can apply CHG directly to the skin and wash gently with a scrungie or a clean washcloth.   5. Apply the CHG Soap to your body ONLY FROM THE NECK DOWN.  Do not use on open wounds or open sores. Avoid contact with your eyes, ears, mouth and genitals (private parts). Wash Face and genitals  (private parts)  with your normal soap.   6. Wash thoroughly, paying special attention to the area where your surgery will be performed.  7. Thoroughly rinse your body with warm water from the neck down.  8. DO NOT shower/wash with your normal soap after using and rinsing off the CHG Soap.  9. Pat yourself dry with a CLEAN TOWEL.  10. Wear CLEAN PAJAMAS to bed the night before surgery, wear comfortable clothes the morning of surgery  11. Place CLEAN SHEETS on your bed the night of your first shower and DO NOT SLEEP WITH PETS.    Day of Surgery:  Do not apply any deodorants/lotions. Please shower the morning of surgery with the CHG soap  Please wear clean clothes to the hospital/surgery center.   Remember to brush your teeth WITH YOUR REGULAR TOOTHPASTE.   Please read over the following fact sheets that you were given.

## 2018-11-11 ENCOUNTER — Encounter (HOSPITAL_COMMUNITY)
Admission: RE | Admit: 2018-11-11 | Discharge: 2018-11-11 | Disposition: A | Discharge status: Home / Self Care | Payer: Medicare Other | Source: Ambulatory Visit | Attending: Orthopaedic Surgery | Admitting: Orthopaedic Surgery

## 2018-11-11 ENCOUNTER — Encounter (HOSPITAL_COMMUNITY): Payer: Self-pay

## 2018-11-11 ENCOUNTER — Ambulatory Visit (HOSPITAL_COMMUNITY)
Admission: RE | Admit: 2018-11-11 | Discharge: 2018-11-11 | Disposition: A | Discharge status: Home / Self Care | Payer: Medicare Other | Source: Ambulatory Visit | Attending: Physician Assistant | Admitting: Physician Assistant

## 2018-11-11 ENCOUNTER — Other Ambulatory Visit: Payer: Self-pay

## 2018-11-11 DIAGNOSIS — M1712 Unilateral primary osteoarthritis, left knee: Secondary | ICD-10-CM | POA: Insufficient documentation

## 2018-11-11 DIAGNOSIS — Z01818 Encounter for other preprocedural examination: Secondary | ICD-10-CM | POA: Insufficient documentation

## 2018-11-11 DIAGNOSIS — E785 Hyperlipidemia, unspecified: Secondary | ICD-10-CM | POA: Insufficient documentation

## 2018-11-11 DIAGNOSIS — I10 Essential (primary) hypertension: Secondary | ICD-10-CM | POA: Insufficient documentation

## 2018-11-11 DIAGNOSIS — Z471 Aftercare following joint replacement surgery: Secondary | ICD-10-CM | POA: Diagnosis not present

## 2018-11-11 DIAGNOSIS — Z79899 Other long term (current) drug therapy: Secondary | ICD-10-CM | POA: Insufficient documentation

## 2018-11-11 DIAGNOSIS — Z96652 Presence of left artificial knee joint: Secondary | ICD-10-CM | POA: Diagnosis not present

## 2018-11-11 LAB — CBC WITH DIFFERENTIAL/PLATELET
Abs Immature Granulocytes: 0.01 10*3/uL (ref 0.00–0.07)
Basophils Absolute: 0 10*3/uL (ref 0.0–0.1)
Basophils Relative: 1 %
Eosinophils Absolute: 0.2 10*3/uL (ref 0.0–0.5)
Eosinophils Relative: 5 %
HCT: 47.3 % — ABNORMAL HIGH (ref 36.0–46.0)
Hemoglobin: 15.4 g/dL — ABNORMAL HIGH (ref 12.0–15.0)
Immature Granulocytes: 0 %
Lymphocytes Relative: 32 %
Lymphs Abs: 1.5 10*3/uL (ref 0.7–4.0)
MCH: 29.7 pg (ref 26.0–34.0)
MCHC: 32.6 g/dL (ref 30.0–36.0)
MCV: 91.3 fL (ref 80.0–100.0)
Monocytes Absolute: 0.5 10*3/uL (ref 0.1–1.0)
Monocytes Relative: 10 %
Neutro Abs: 2.4 10*3/uL (ref 1.7–7.7)
Neutrophils Relative %: 52 %
Platelets: 182 10*3/uL (ref 150–400)
RBC: 5.18 MIL/uL — ABNORMAL HIGH (ref 3.87–5.11)
RDW: 12.5 % (ref 11.5–15.5)
WBC: 4.6 10*3/uL (ref 4.0–10.5)
nRBC: 0 % (ref 0.0–0.2)

## 2018-11-11 LAB — TYPE AND SCREEN
ABO/RH(D): A POS
Antibody Screen: NEGATIVE

## 2018-11-11 LAB — COMPREHENSIVE METABOLIC PANEL
ALT: 21 U/L (ref 0–44)
AST: 28 U/L (ref 15–41)
Albumin: 3.8 g/dL (ref 3.5–5.0)
Alkaline Phosphatase: 36 U/L — ABNORMAL LOW (ref 38–126)
Anion gap: 10 (ref 5–15)
BUN: 19 mg/dL (ref 8–23)
CO2: 24 mmol/L (ref 22–32)
Calcium: 9.3 mg/dL (ref 8.9–10.3)
Chloride: 104 mmol/L (ref 98–111)
Creatinine, Ser: 0.89 mg/dL (ref 0.44–1.00)
GFR calc Af Amer: >60 mL/min (ref >60)
GFR calc non Af Amer: >60 mL/min (ref >60)
Glucose, Bld: 105 mg/dL — ABNORMAL HIGH (ref 70–99)
Potassium: 3.9 mmol/L (ref 3.5–5.1)
Sodium: 138 mmol/L (ref 135–145)
Total Bilirubin: 0.9 mg/dL (ref 0.3–1.2)
Total Protein: 7.6 g/dL (ref 6.5–8.1)

## 2018-11-11 LAB — PROTIME-INR
INR: 1.1 (ref 0.8–1.2)
Prothrombin Time: 13.6 seconds (ref 11.4–15.2)

## 2018-11-11 LAB — SURGICAL PCR SCREEN
MRSA, PCR: NEGATIVE
Staphylococcus aureus: NEGATIVE

## 2018-11-11 LAB — APTT: aPTT: 30 seconds (ref 24–36)

## 2018-11-11 NOTE — Pre-Procedure Instructions (Signed)
Pt here with- Interpreter  COVID test scheduled for tomorrow No associating sx of Covid. No h/o exposure. No h/o recent travel.  PCP -Dr Antonietta Jewel   Cardiologist - Dr Lauree Chandler  Chest x-ray - today  EKG - today  Stress Test -01-19-14 epic   ECHO -08-31-09 epic   Cardiac Cath - denies  AICD-denies PM-denies LOOP-denies  Sleep Study -  CPAP -   LABS-CBC diff, CMP,PT-INR,APTT,T/S, PCR  ASA-denies  ERAS- Ensure with instructions  HA1C-No Fasting Blood Sugar -  Checks Blood Sugar _____ times a day  Anesthesia-Y. Review CXR Pt denies having chest pain, sob, or fever at this time. All instructions explained to the pt, with a verbal understanding of the material. Pt agrees to go over the instructions while at home for a better understanding. Pt also instructed to self quarantine after being tested for COVID-19. The opportunity to ask questions was provided. ^

## 2018-11-12 ENCOUNTER — Other Ambulatory Visit (HOSPITAL_COMMUNITY)
Admission: RE | Admit: 2018-11-12 | Discharge: 2018-11-12 | Disposition: A | Discharge status: Home / Self Care | Payer: Medicare Other | Source: Ambulatory Visit | Attending: Orthopaedic Surgery | Admitting: Orthopaedic Surgery

## 2018-11-12 DIAGNOSIS — Z20828 Contact with and (suspected) exposure to other viral communicable diseases: Secondary | ICD-10-CM | POA: Insufficient documentation

## 2018-11-12 DIAGNOSIS — Z01812 Encounter for preprocedural laboratory examination: Secondary | ICD-10-CM | POA: Insufficient documentation

## 2018-11-12 NOTE — Anesthesia Preprocedure Evaluation (Addendum)
Anesthesia Evaluation  Patient identified by MRN, date of birth, ID band Patient awake    Reviewed: Allergy & Precautions, NPO status , Patient's Chart, lab work & pertinent test results, reviewed documented beta blocker date and time   History of Anesthesia Complications Negative for: history of anesthetic complications  Airway Mallampati: II  TM Distance: >3 FB Neck ROM: Full    Dental  (+) Dental Advisory Given   Pulmonary neg pulmonary ROS,    Pulmonary exam normal        Cardiovascular hypertension, Pt. on home beta blockers and Pt. on medications Normal cardiovascular exam+ dysrhythmias Supra Ventricular Tachycardia    Follows yearly with cardiology for hx of SVT, PACs. She was remotely admitted to [facility] in 2011 with CP and ruled out with serial enzymes. She has had c/o palpitations over the years. 48 hour Holter monitor showed sinus rhythm with PACs and shorts runs of SVT so metoprolol was started. She also has history of intermittent chest pain with normal stress tests in 2013 and 2015. She also has had intermittent hospital encounters for chest pain with rule-outs and negative d-dimer. Last seen by cardiology Oct 2019. At that time her chest pain had resolved (per notes it was associated with stress when her mother was sick) and her palpitations were well controlled on beta blocker. It appears that her PCP is now refilling her metoprolol.   Preop labs reviewed, unremarkable.   EKG 11/11/18: Sinus bradycardia. Rate 59.   Nuclear stress 2015: Overall Impression:  Low risk stress nuclear study with a small, mild, fixed distal anterior defect consistent with soft tissue attenuation; no ischemia.  LV Ejection Fraction: 68%.  LV Wall Motion:  NL LV Function; NL Wall Motion    Neuro/Psych negative neurological ROS  negative psych ROS   GI/Hepatic negative GI ROS, Neg liver ROS,   Endo/Other  negative endocrine ROS   Renal/GU negative Renal ROS     Musculoskeletal  (+) Arthritis ,   Abdominal   Peds  Hematology negative hematology ROS (+)  Plt 182k    Anesthesia Other Findings   Reproductive/Obstetrics                          Anesthesia Physical Anesthesia Plan  ASA: II  Anesthesia Plan: Spinal   Post-op Pain Management:  Regional for Post-op pain   Induction: Intravenous  PONV Risk Score and Plan: 2 and Treatment may vary due to age or medical condition, Propofol infusion and Ondansetron  Airway Management Planned: Natural Airway and Simple Face Mask  Additional Equipment: None  Intra-op Plan:   Post-operative Plan:   Informed Consent: I have reviewed the patients History and Physical, chart, labs and discussed the procedure including the risks, benefits and alternatives for the proposed anesthesia with the patient or authorized representative who has indicated his/her understanding and acceptance.       Plan Discussed with: CRNA and Anesthesiologist  Anesthesia Plan Comments: ( )     Anesthesia Quick Evaluation

## 2018-11-12 NOTE — Progress Notes (Signed)
Anesthesia Chart Review: Follows yearly with cardiology for hx of SVT, PACs. She was remotely admitted to [facility] in 2011 with CP and ruled out with serial enzymes. She has had c/o palpitations over the years. 48 hour Holter monitor showed sinus rhythm with PACs and shorts runs of SVT so metoprolol was started. She also has history of intermittent chest pain with normal stress tests in 2013 and 2015. She also has had intermittent hospital encounters for chest pain with rule-outs and negative d-dimer. Last seen by cardiology Oct 2019. At that time her chest pain had resolved (per notes it was associated with stress when her mother was sick) and her palpitations were well controlled on beta blocker. It appears that her PCP is now refilling her metoprolol.   Preop labs reviewed, unremarkable.   EKG 11/11/18: Sinus bradycardia. Rate 59.   Nuclear stress 2015: Overall Impression:  Low risk stress nuclear study with a small, mild, fixed distal anterior defect consistent with soft tissue attenuation; no ischemia.  LV Ejection Fraction: 68%.  LV Wall Motion:  NL LV Function; NL Wall Motion   Wynonia Musty [facility] Short Stay Center/Anesthesiology Phone [phone] 11/12/2018 10:24 AM

## 2018-11-13 MED ORDER — TRANEXAMIC ACID-NACL 1000-0.7 MG/100ML-% IV SOLN
1000.0000 mg | INTRAVENOUS | Status: AC
  Administered 2018-11-16: 1000 mg via INTRAVENOUS
  Filled 2018-11-13: qty 100

## 2018-11-13 MED ORDER — BUPIVACAINE LIPOSOME 1.3 % IJ SUSP
20.0000 mL | INTRAMUSCULAR | Status: DC
  Filled 2018-11-13: qty 20

## 2018-11-13 MED ORDER — TRANEXAMIC ACID 1000 MG/10ML IV SOLN
2000.0000 mg | INTRAVENOUS | Status: AC
  Administered 2018-11-16: 08:00:00 2000 mg via TOPICAL
  Filled 2018-11-13 (×2): qty 20

## 2018-11-13 MED ORDER — CEFAZOLIN SODIUM-DEXTROSE 2-4 GM/100ML-% IV SOLN
2.0000 g | INTRAVENOUS | Status: AC
  Administered 2018-11-16: 08:00:00 2 g via INTRAVENOUS
  Filled 2018-11-13: qty 100

## 2018-11-14 LAB — NOVEL CORONAVIRUS, NAA (HOSP ORDER, SEND-OUT TO REF LAB; TAT 18-24 HRS): SARS-CoV-2, NAA: NOT DETECTED

## 2018-11-15 ENCOUNTER — Telehealth: Payer: Self-pay | Admitting: *Deleted

## 2018-11-15 NOTE — Telephone Encounter (Signed)
Pre-op Ortho bundle call completed. 

## 2018-11-15 NOTE — Care Plan (Signed)
RNCM met with patient while she was in office at last appointment with Dr. Erlinda Hong. She is currently scheduled for a Left TKA on 11/16/18. She has family that can assist after surgery. Anticipate home health PT after short hospital stay. Choice provided. Referral made to [facility] for home health. Patient requested OPPT with [facility] on [address]. Patient has a FWW, but will need a BSC/3in1. Referral to [facility] for DME. Will continue to follow for all CM needs after surgery.

## 2018-11-16 ENCOUNTER — Other Ambulatory Visit: Payer: Self-pay

## 2018-11-16 ENCOUNTER — Ambulatory Visit (HOSPITAL_COMMUNITY): Payer: Medicare Other | Admitting: Physician Assistant

## 2018-11-16 ENCOUNTER — Ambulatory Visit (HOSPITAL_COMMUNITY): Payer: Medicare Other | Admitting: Anesthesiology

## 2018-11-16 ENCOUNTER — Encounter (HOSPITAL_COMMUNITY): Payer: Self-pay | Admitting: General Practice

## 2018-11-16 ENCOUNTER — Other Ambulatory Visit: Payer: Self-pay | Admitting: *Deleted

## 2018-11-16 ENCOUNTER — Observation Stay (HOSPITAL_COMMUNITY): Payer: Medicare Other

## 2018-11-16 ENCOUNTER — Inpatient Hospital Stay (HOSPITAL_COMMUNITY)
Admission: RE | Admit: 2018-11-16 | Discharge: 2018-11-18 | DRG: 470 | Disposition: A | Discharge status: Home Health | Payer: Medicare Other | Attending: Orthopaedic Surgery | Admitting: Orthopaedic Surgery

## 2018-11-16 ENCOUNTER — Encounter (HOSPITAL_COMMUNITY)
Admission: RE | Disposition: A | Discharge status: Home Health | Payer: Self-pay | Source: Home / Self Care | Attending: Orthopaedic Surgery

## 2018-11-16 DIAGNOSIS — M1712 Unilateral primary osteoarthritis, left knee: Secondary | ICD-10-CM | POA: Diagnosis not present

## 2018-11-16 DIAGNOSIS — Z888 Allergy status to other drugs, medicaments and biological substances status: Secondary | ICD-10-CM

## 2018-11-16 DIAGNOSIS — Z96652 Presence of left artificial knee joint: Secondary | ICD-10-CM | POA: Diagnosis not present

## 2018-11-16 DIAGNOSIS — I10 Essential (primary) hypertension: Secondary | ICD-10-CM | POA: Diagnosis not present

## 2018-11-16 DIAGNOSIS — Z471 Aftercare following joint replacement surgery: Secondary | ICD-10-CM | POA: Diagnosis not present

## 2018-11-16 DIAGNOSIS — Z79899 Other long term (current) drug therapy: Secondary | ICD-10-CM | POA: Diagnosis not present

## 2018-11-16 DIAGNOSIS — G8918 Other acute postprocedural pain: Secondary | ICD-10-CM | POA: Diagnosis not present

## 2018-11-16 DIAGNOSIS — Z96651 Presence of right artificial knee joint: Secondary | ICD-10-CM | POA: Diagnosis present

## 2018-11-16 DIAGNOSIS — E785 Hyperlipidemia, unspecified: Secondary | ICD-10-CM | POA: Diagnosis present

## 2018-11-16 DIAGNOSIS — Z96659 Presence of unspecified artificial knee joint: Secondary | ICD-10-CM

## 2018-11-16 HISTORY — PX: TOTAL KNEE ARTHROPLASTY: SHX125

## 2018-11-16 SURGERY — ARTHROPLASTY, KNEE, TOTAL
Anesthesia: Spinal | Site: Knee | Laterality: Left

## 2018-11-16 MED ORDER — ONDANSETRON HCL 4 MG PO TABS: 4.0000 mg | ORAL_TABLET | Freq: Three times a day (TID) | ORAL | 0 refills | Status: DC | PRN

## 2018-11-16 MED ORDER — ASPIRIN EC 81 MG PO TBEC: 81.0000 mg | DELAYED_RELEASE_TABLET | Freq: Two times a day (BID) | ORAL | 0 refills | Status: DC

## 2018-11-16 MED ORDER — METOPROLOL SUCCINATE ER 25 MG PO TB24
25.0000 mg | ORAL_TABLET | Freq: Every day | ORAL | Status: DC
  Administered 2018-11-18: 25 mg via ORAL
  Filled 2018-11-16 (×2): qty 1

## 2018-11-16 MED ORDER — SODIUM CHLORIDE 0.9 % IR SOLN
Status: DC | PRN
  Administered 2018-11-16: 3000 mL

## 2018-11-16 MED ORDER — STERILE WATER FOR IRRIGATION IR SOLN
Status: DC | PRN
  Administered 2018-11-16: 1000 mL

## 2018-11-16 MED ORDER — SODIUM CHLORIDE 0.9 % IV SOLN
INTRAVENOUS | Status: DC | PRN
  Administered 2018-11-16: 10 ug/min via INTRAVENOUS

## 2018-11-16 MED ORDER — DIPHENHYDRAMINE HCL 12.5 MG/5ML PO ELIX: 25.0000 mg | ORAL_SOLUTION | ORAL | Status: DC | PRN

## 2018-11-16 MED ORDER — PHENOL 1.4 % MT LIQD: 1.0000 | OROMUCOSAL | Status: DC | PRN

## 2018-11-16 MED ORDER — ONDANSETRON HCL 4 MG/2ML IJ SOLN: 4.0000 mg | Freq: Four times a day (QID) | INTRAMUSCULAR | Status: DC | PRN

## 2018-11-16 MED ORDER — KETOROLAC TROMETHAMINE 15 MG/ML IJ SOLN: 30.0000 mg | Freq: Four times a day (QID) | INTRAMUSCULAR | Status: DC

## 2018-11-16 MED ORDER — ONDANSETRON HCL 4 MG PO TABS: 4.0000 mg | ORAL_TABLET | Freq: Four times a day (QID) | ORAL | Status: DC | PRN

## 2018-11-16 MED ORDER — GABAPENTIN 300 MG PO CAPS
300.0000 mg | ORAL_CAPSULE | Freq: Three times a day (TID) | ORAL | Status: DC
  Administered 2018-11-16 – 2018-11-18 (×7): 300 mg via ORAL
  Filled 2018-11-16 (×7): qty 1

## 2018-11-16 MED ORDER — BUPIVACAINE LIPOSOME 1.3 % IJ SUSP
INTRAMUSCULAR | Status: DC | PRN
  Administered 2018-11-16: 20 mL

## 2018-11-16 MED ORDER — METHOCARBAMOL 750 MG PO TABS: 750.0000 mg | ORAL_TABLET | Freq: Two times a day (BID) | ORAL | 0 refills | Status: DC | PRN

## 2018-11-16 MED ORDER — HYDROMORPHONE HCL 1 MG/ML IJ SOLN: 0.5000 mg | INTRAMUSCULAR | Status: DC | PRN

## 2018-11-16 MED ORDER — VANCOMYCIN HCL 1000 MG IV SOLR
INTRAVENOUS | Status: DC | PRN
  Administered 2018-11-16: 1000 mg

## 2018-11-16 MED ORDER — PRAVASTATIN SODIUM 40 MG PO TABS
40.0000 mg | ORAL_TABLET | Freq: Every day | ORAL | Status: DC
  Administered 2018-11-16 – 2018-11-17 (×2): 40 mg via ORAL
  Filled 2018-11-16 (×2): qty 1

## 2018-11-16 MED ORDER — SODIUM CHLORIDE 0.9% FLUSH
INTRAVENOUS | Status: DC | PRN
  Administered 2018-11-16: 20 mL

## 2018-11-16 MED ORDER — ASPIRIN 81 MG PO CHEW
81.0000 mg | CHEWABLE_TABLET | Freq: Two times a day (BID) | ORAL | Status: DC
  Administered 2018-11-16 – 2018-11-18 (×4): 81 mg via ORAL
  Filled 2018-11-16 (×4): qty 1

## 2018-11-16 MED ORDER — ONDANSETRON HCL 4 MG/2ML IJ SOLN
INTRAMUSCULAR | Status: DC | PRN
  Administered 2018-11-16: 4 mg via INTRAVENOUS

## 2018-11-16 MED ORDER — CHLORHEXIDINE GLUCONATE 4 % EX LIQD: 60.0000 mL | Freq: Once | CUTANEOUS | Status: DC

## 2018-11-16 MED ORDER — DOCUSATE SODIUM 100 MG PO CAPS
100.0000 mg | ORAL_CAPSULE | Freq: Two times a day (BID) | ORAL | Status: DC
  Administered 2018-11-16 – 2018-11-18 (×4): 100 mg via ORAL
  Filled 2018-11-16 (×4): qty 1

## 2018-11-16 MED ORDER — BUPIVACAINE-EPINEPHRINE 0.25% -1:200000 IJ SOLN
INTRAMUSCULAR | Status: DC | PRN
  Administered 2018-11-16: 20 mL

## 2018-11-16 MED ORDER — KETOROLAC TROMETHAMINE 15 MG/ML IJ SOLN
15.0000 mg | Freq: Four times a day (QID) | INTRAMUSCULAR | Status: AC
  Administered 2018-11-16 – 2018-11-17 (×4): 15 mg via INTRAVENOUS
  Filled 2018-11-16 (×4): qty 1

## 2018-11-16 MED ORDER — SENNOSIDES-DOCUSATE SODIUM 8.6-50 MG PO TABS: 1.0000 | ORAL_TABLET | Freq: Every evening | ORAL | 1 refills | Status: DC | PRN

## 2018-11-16 MED ORDER — BUPIVACAINE-EPINEPHRINE (PF) 0.25% -1:200000 IJ SOLN
INTRAMUSCULAR | Status: AC
  Filled 2018-11-16: qty 20

## 2018-11-16 MED ORDER — DEXAMETHASONE SODIUM PHOSPHATE 10 MG/ML IJ SOLN
INTRAMUSCULAR | Status: DC | PRN
  Administered 2018-11-16: 10 mg via INTRAVENOUS

## 2018-11-16 MED ORDER — CELECOXIB 200 MG PO CAPS: 200.0000 mg | ORAL_CAPSULE | Freq: Two times a day (BID) | ORAL | Status: DC

## 2018-11-16 MED ORDER — CELECOXIB 200 MG PO CAPS
200.0000 mg | ORAL_CAPSULE | Freq: Two times a day (BID) | ORAL | Status: DC
  Administered 2018-11-17 – 2018-11-18 (×3): 200 mg via ORAL
  Filled 2018-11-16 (×3): qty 1

## 2018-11-16 MED ORDER — MAGNESIUM CITRATE PO SOLN: 1.0000 | Freq: Once | ORAL | Status: DC | PRN

## 2018-11-16 MED ORDER — ALUM & MAG HYDROXIDE-SIMETH 200-200-20 MG/5ML PO SUSP: 30.0000 mL | ORAL | Status: DC | PRN

## 2018-11-16 MED ORDER — SORBITOL 70 % SOLN: 30.0000 mL | Freq: Every day | Status: DC | PRN

## 2018-11-16 MED ORDER — FENTANYL CITRATE (PF) 100 MCG/2ML IJ SOLN: 25.0000 ug | INTRAMUSCULAR | Status: DC | PRN

## 2018-11-16 MED ORDER — VANCOMYCIN HCL 1000 MG IV SOLR
INTRAVENOUS | Status: AC
  Filled 2018-11-16: qty 1000

## 2018-11-16 MED ORDER — FENTANYL CITRATE (PF) 250 MCG/5ML IJ SOLN
INTRAMUSCULAR | Status: AC
  Filled 2018-11-16: qty 5

## 2018-11-16 MED ORDER — LACTATED RINGERS IV SOLN
INTRAVENOUS | Status: DC
  Administered 2018-11-16: 07:00:00 via INTRAVENOUS

## 2018-11-16 MED ORDER — BUPIVACAINE IN DEXTROSE 0.75-8.25 % IT SOLN
INTRATHECAL | Status: DC | PRN
  Administered 2018-11-16: 1.6 mL via INTRATHECAL

## 2018-11-16 MED ORDER — MIDAZOLAM HCL 5 MG/5ML IJ SOLN
INTRAMUSCULAR | Status: DC | PRN
  Administered 2018-11-16 (×2): 1 mg via INTRAVENOUS

## 2018-11-16 MED ORDER — METOCLOPRAMIDE HCL 5 MG PO TABS: 5.0000 mg | ORAL_TABLET | Freq: Three times a day (TID) | ORAL | Status: DC | PRN

## 2018-11-16 MED ORDER — OXYCODONE HCL ER 10 MG PO T12A
10.0000 mg | EXTENDED_RELEASE_TABLET | Freq: Two times a day (BID) | ORAL | Status: DC
  Administered 2018-11-16 – 2018-11-18 (×5): 10 mg via ORAL
  Filled 2018-11-16 (×5): qty 1

## 2018-11-16 MED ORDER — ONDANSETRON HCL 4 MG/2ML IJ SOLN: 4.0000 mg | Freq: Once | INTRAMUSCULAR | Status: DC | PRN

## 2018-11-16 MED ORDER — POVIDONE-IODINE 10 % EX SWAB: 2.0000 "application " | Freq: Once | CUTANEOUS | Status: DC

## 2018-11-16 MED ORDER — OXYCODONE HCL ER 10 MG PO T12A: 10.0000 mg | EXTENDED_RELEASE_TABLET | Freq: Two times a day (BID) | ORAL | 0 refills | Status: AC

## 2018-11-16 MED ORDER — ACETAMINOPHEN 500 MG PO TABS
1000.0000 mg | ORAL_TABLET | Freq: Four times a day (QID) | ORAL | Status: AC
  Administered 2018-11-16 – 2018-11-17 (×4): 1000 mg via ORAL
  Filled 2018-11-16 (×4): qty 2

## 2018-11-16 MED ORDER — TRANEXAMIC ACID-NACL 1000-0.7 MG/100ML-% IV SOLN
1000.0000 mg | Freq: Once | INTRAVENOUS | Status: AC
  Administered 2018-11-16: 1000 mg via INTRAVENOUS
  Filled 2018-11-16: qty 100

## 2018-11-16 MED ORDER — PROPOFOL 10 MG/ML IV BOLUS
INTRAVENOUS | Status: AC
  Filled 2018-11-16: qty 20

## 2018-11-16 MED ORDER — ROPIVACAINE HCL 7.5 MG/ML IJ SOLN
INTRAMUSCULAR | Status: DC | PRN
  Administered 2018-11-16: 20 mL via PERINEURAL

## 2018-11-16 MED ORDER — METHOCARBAMOL 1000 MG/10ML IJ SOLN
500.0000 mg | Freq: Four times a day (QID) | INTRAVENOUS | Status: DC | PRN
  Filled 2018-11-16: qty 5

## 2018-11-16 MED ORDER — MENTHOL 3 MG MT LOZG: 1.0000 | LOZENGE | OROMUCOSAL | Status: DC | PRN

## 2018-11-16 MED ORDER — PROPOFOL 500 MG/50ML IV EMUL
INTRAVENOUS | Status: DC | PRN
  Administered 2018-11-16: 50 ug/kg/min via INTRAVENOUS

## 2018-11-16 MED ORDER — METHOCARBAMOL 500 MG PO TABS: 500.0000 mg | ORAL_TABLET | Freq: Four times a day (QID) | ORAL | Status: DC | PRN

## 2018-11-16 MED ORDER — OXYCODONE HCL 5 MG/5ML PO SOLN: 5.0000 mg | Freq: Once | ORAL | Status: DC | PRN

## 2018-11-16 MED ORDER — OXYCODONE HCL 5 MG PO TABS: 10.0000 mg | ORAL_TABLET | ORAL | Status: DC | PRN

## 2018-11-16 MED ORDER — ACETAMINOPHEN 325 MG PO TABS: 325.0000 mg | ORAL_TABLET | Freq: Four times a day (QID) | ORAL | Status: DC | PRN

## 2018-11-16 MED ORDER — MIDAZOLAM HCL 2 MG/2ML IJ SOLN
INTRAMUSCULAR | Status: AC
  Filled 2018-11-16: qty 2

## 2018-11-16 MED ORDER — FENTANYL CITRATE (PF) 100 MCG/2ML IJ SOLN
INTRAMUSCULAR | Status: DC | PRN
  Administered 2018-11-16: 50 ug via INTRAVENOUS

## 2018-11-16 MED ORDER — 0.9 % SODIUM CHLORIDE (POUR BTL) OPTIME
TOPICAL | Status: DC | PRN
  Administered 2018-11-16: 1000 mL

## 2018-11-16 MED ORDER — CEFAZOLIN SODIUM-DEXTROSE 2-4 GM/100ML-% IV SOLN
2.0000 g | Freq: Four times a day (QID) | INTRAVENOUS | Status: AC
  Administered 2018-11-16 – 2018-11-17 (×3): 2 g via INTRAVENOUS
  Filled 2018-11-16 (×3): qty 100

## 2018-11-16 MED ORDER — DEXAMETHASONE SODIUM PHOSPHATE 10 MG/ML IJ SOLN
10.0000 mg | Freq: Once | INTRAMUSCULAR | Status: AC
  Administered 2018-11-17: 10 mg via INTRAVENOUS
  Filled 2018-11-16: qty 1

## 2018-11-16 MED ORDER — OXYCODONE HCL 5 MG PO TABS: 5.0000 mg | ORAL_TABLET | Freq: Once | ORAL | Status: DC | PRN

## 2018-11-16 MED ORDER — PROPOFOL 10 MG/ML IV BOLUS: INTRAVENOUS | Status: DC | PRN

## 2018-11-16 MED ORDER — POLYETHYLENE GLYCOL 3350 17 G PO PACK: 17.0000 g | PACK | Freq: Every day | ORAL | Status: DC | PRN

## 2018-11-16 MED ORDER — OXYCODONE HCL 5 MG PO TABS: 5.0000 mg | ORAL_TABLET | ORAL | Status: DC | PRN

## 2018-11-16 MED ORDER — OXYCODONE HCL 5 MG PO TABS: 5.0000 mg | ORAL_TABLET | Freq: Three times a day (TID) | ORAL | 0 refills | Status: DC | PRN

## 2018-11-16 MED ORDER — METOCLOPRAMIDE HCL 5 MG/ML IJ SOLN: 5.0000 mg | Freq: Three times a day (TID) | INTRAMUSCULAR | Status: DC | PRN

## 2018-11-16 MED ORDER — LIDOCAINE 2% (20 MG/ML) 5 ML SYRINGE
INTRAMUSCULAR | Status: DC | PRN
  Administered 2018-11-16: 60 mg via INTRAVENOUS

## 2018-11-16 MED ORDER — SODIUM CHLORIDE 0.9 % IV SOLN
INTRAVENOUS | Status: DC
  Administered 2018-11-16 – 2018-11-17 (×3): via INTRAVENOUS

## 2018-11-16 SURGICAL SUPPLY — 79 items
ALCOHOL 70% 16 OZ (MISCELLANEOUS) ×3 IMPLANT
BAG DECANTER FOR FLEXI CONT (MISCELLANEOUS) ×3 IMPLANT
BANDAGE ESMARK 6X9 LF (GAUZE/BANDAGES/DRESSINGS) ×1 IMPLANT
BASEPLATE TIBIAL PRIMARY SZ4 (Plate) ×3 IMPLANT
BEARIN INSERT TIBIAL SZ4 9 (Orthopedic Implant) ×2 IMPLANT
BEARIN INSERT TIBIAL SZ4 9MM (Orthopedic Implant) ×1 IMPLANT
BEARING INSERT TIBIAL SZ4 9 (Orthopedic Implant) ×1 IMPLANT
BLADE SAG 18X100X1.27 (BLADE) ×3 IMPLANT
BNDG ELASTIC 6X10 VLCR STRL LF (GAUZE/BANDAGES/DRESSINGS) ×3 IMPLANT
BNDG ESMARK 6X9 LF (GAUZE/BANDAGES/DRESSINGS) ×3
BOWL SMART MIX CTS (DISPOSABLE) ×3 IMPLANT
CEMENT BONE REFOBACIN R1X40 US (Cement) ×6 IMPLANT
CLOSURE STERI-STRIP 1/2X4 (GAUZE/BANDAGES/DRESSINGS) ×2
CLSR STERI-STRIP ANTIMIC 1/2X4 (GAUZE/BANDAGES/DRESSINGS) ×4 IMPLANT
COVER SURGICAL LIGHT HANDLE (MISCELLANEOUS) ×3 IMPLANT
COVER WAND RF STERILE (DRAPES) ×3 IMPLANT
CUFF TOURN SGL QUICK 34 (TOURNIQUET CUFF) ×2
CUFF TOURN SGL QUICK 42 (TOURNIQUET CUFF) IMPLANT
CUFF TRNQT CYL 34X4.125X (TOURNIQUET CUFF) ×1 IMPLANT
DERMABOND ADVANCED (GAUZE/BANDAGES/DRESSINGS) ×2
DERMABOND ADVANCED .7 DNX12 (GAUZE/BANDAGES/DRESSINGS) ×1 IMPLANT
DRAPE EXTREMITY T 121X128X90 (DISPOSABLE) ×3 IMPLANT
DRAPE HALF SHEET 40X57 (DRAPES) ×3 IMPLANT
DRAPE INCISE IOBAN 66X45 STRL (DRAPES) IMPLANT
DRAPE ORTHO SPLIT 77X108 STRL (DRAPES) ×4
DRAPE POUCH INSTRU U-SHP 10X18 (DRAPES) ×3 IMPLANT
DRAPE SURG ORHT 6 SPLT 77X108 (DRAPES) ×2 IMPLANT
DRAPE U-SHAPE 47X51 STRL (DRAPES) ×6 IMPLANT
DRSG PAD ABDOMINAL 8X10 ST (GAUZE/BANDAGES/DRESSINGS) ×6 IMPLANT
DURAPREP 26ML APPLICATOR (WOUND CARE) ×6 IMPLANT
ELECT CAUTERY BLADE 6.4 (BLADE) ×3 IMPLANT
ELECT REM PT RETURN 9FT ADLT (ELECTROSURGICAL) ×3
ELECTRODE REM PT RTRN 9FT ADLT (ELECTROSURGICAL) ×1 IMPLANT
FEMORAL POSTERIOR STAB SZ4 (Orthopedic Implant) ×3 IMPLANT
GAUZE SPONGE 4X4 12PLY STRL (GAUZE/BANDAGES/DRESSINGS) ×3 IMPLANT
GAUZE SPONGE 4X4 12PLY STRL LF (GAUZE/BANDAGES/DRESSINGS) ×3 IMPLANT
GLOVE BIOGEL PI IND STRL 7.0 (GLOVE) ×1 IMPLANT
GLOVE BIOGEL PI INDICATOR 7.0 (GLOVE) ×2
GLOVE ECLIPSE 7.0 STRL STRAW (GLOVE) ×9 IMPLANT
GLOVE SKINSENSE NS SZ7.5 (GLOVE) ×2
GLOVE SKINSENSE STRL SZ7.5 (GLOVE) ×1 IMPLANT
GLOVE SURG SYN 7.5  E (GLOVE) ×8
GLOVE SURG SYN 7.5 E (GLOVE) ×4 IMPLANT
GOWN STRL REIN XL XLG (GOWN DISPOSABLE) ×3 IMPLANT
GOWN STRL REUS W/ TWL LRG LVL3 (GOWN DISPOSABLE) ×1 IMPLANT
GOWN STRL REUS W/TWL LRG LVL3 (GOWN DISPOSABLE) ×2
HANDPIECE INTERPULSE COAX TIP (DISPOSABLE) ×2
HOOD PEEL AWAY FLYTE STAYCOOL (MISCELLANEOUS) ×6 IMPLANT
KIT BASIN OR (CUSTOM PROCEDURE TRAY) ×3 IMPLANT
KIT TURNOVER KIT B (KITS) ×3 IMPLANT
MANIFOLD NEPTUNE II (INSTRUMENTS) ×3 IMPLANT
MARKER SKIN DUAL TIP RULER LAB (MISCELLANEOUS) ×3 IMPLANT
NEEDLE SPNL 18GX3.5 QUINCKE PK (NEEDLE) ×6 IMPLANT
NS IRRIG 1000ML POUR BTL (IV SOLUTION) ×3 IMPLANT
PACK TOTAL JOINT (CUSTOM PROCEDURE TRAY) ×3 IMPLANT
PAD ABD 8X10 STRL (GAUZE/BANDAGES/DRESSINGS) ×3 IMPLANT
PAD ARMBOARD 7.5X6 YLW CONV (MISCELLANEOUS) ×6 IMPLANT
PAD CAST 4YDX4 CTTN HI CHSV (CAST SUPPLIES) ×1 IMPLANT
PADDING CAST COTTON 4X4 STRL (CAST SUPPLIES) ×2
PADDING CAST COTTON 6X4 STRL (CAST SUPPLIES) ×3 IMPLANT
PATELLA 32MMX10MM (Knees) ×3 IMPLANT
SAW OSC TIP CART 19.5X105X1.3 (SAW) ×3 IMPLANT
SET HNDPC FAN SPRY TIP SCT (DISPOSABLE) ×1 IMPLANT
STAPLER VISISTAT 35W (STAPLE) IMPLANT
SUCTION FRAZIER HANDLE 10FR (MISCELLANEOUS) ×2
SUCTION TUBE FRAZIER 10FR DISP (MISCELLANEOUS) ×1 IMPLANT
SUT ETHILON 2 0 FS 18 (SUTURE) IMPLANT
SUT MNCRL AB 4-0 PS2 18 (SUTURE) IMPLANT
SUT VIC AB 0 CT1 27 (SUTURE) ×4
SUT VIC AB 0 CT1 27XBRD ANBCTR (SUTURE) ×2 IMPLANT
SUT VIC AB 1 CTX 27 (SUTURE) ×9 IMPLANT
SUT VIC AB 2-0 CT1 27 (SUTURE) ×8
SUT VIC AB 2-0 CT1 TAPERPNT 27 (SUTURE) ×4 IMPLANT
SYRINGE REG LEUR 60CC (SYRINGE) ×6 IMPLANT
TOWEL GREEN STERILE (TOWEL DISPOSABLE) ×3 IMPLANT
TOWEL GREEN STERILE FF (TOWEL DISPOSABLE) ×3 IMPLANT
TRAY CATH 16FR W/PLASTIC CATH (SET/KITS/TRAYS/PACK) IMPLANT
UNDERPAD 30X30 (UNDERPADS AND DIAPERS) ×3 IMPLANT
WRAP KNEE MAXI GEL POST OP (GAUZE/BANDAGES/DRESSINGS) ×3 IMPLANT

## 2018-11-16 NOTE — Progress Notes (Signed)
Interpreter used for entire PACU stay.

## 2018-11-16 NOTE — Op Note (Signed)
Total Knee Arthroplasty Procedure Note  Preoperative diagnosis: Left knee osteoarthritis  Postoperative diagnosis:same  Operative procedure: Left total knee arthroplasty. CPT [id number]  Surgeon: N. Eduard Roux, MD  Assist: Madalyn Rob, PA-C; necessary for the timely completion of procedure and due to complexity of procedure.  Anesthesia: Spinal, regional  Tourniquet time: see anesthesia record  Implants used: [employer]ker Triatholon Femur: PS 4 Tibia: 4 Patella: 32 mm Polyethylene: 9 mm  Indication: Ariel Harris is a 73 y.o. year old female with a history of knee pain. Having failed conservative management, the patient elected to proceed with a total knee arthroplasty.  We have reviewed the risk and benefits of the surgery and they elected to proceed after voicing understanding.  Procedure:  After informed consent was obtained and understanding of the risk were voiced including but not limited to bleeding, infection, damage to surrounding structures including nerves and vessels, blood clots, leg length inequality and the failure to achieve desired results, the operative extremity was marked with verbal confirmation of the patient in the holding area.   The patient was then brought to the operating room and transported to the operating room table in the supine position.  A tourniquet was applied to the operative extremity around the upper thigh. The operative limb was then prepped and draped in the usual sterile fashion and preoperative antibiotics were administered.  A time out was performed prior to the start of surgery confirming the correct extremity, preoperative antibiotic administration, as well as team members, implants and instruments available for the case. Correct surgical site was also confirmed with preoperative radiographs. The limb was then elevated for exsanguination and the tourniquet was inflated. A midline incision was made and a standard medial parapatellar  approach was performed.  The patella was prepared and sized to a 32 mm.  A cover was placed on the patella for protection from retractors.  We then turned our attention to the femur. Posterior cruciate ligament was sacrificed. Start site was drilled in the femur and the intramedullary distal femoral cutting guide was placed, set at 5 degrees valgus, taking 11 mm of distal resection. The distal cut was made. Osteophytes were then removed.  Extension gap was then checked. Next, the proximal tibial cutting guide was placed with appropriate slope, varus/valgus alignment and depth of resection. The proximal tibial cut was made. Gap blocks were then used to assess the extension gap and alignment, and appropriate soft tissue releases were performed. Attention was turned back to the femur, which was sized using the sizing guide to a size 4. Appropriate rotation of the femoral component was determined using epicondylar axis, Whiteside's line, and assessing the flexion gap under ligament tension. The appropriate size 4-in-1 cutting block was placed and cuts were made. Posterior femoral osteophytes and uncapped bone were then removed with the curved osteotome.  Trial components were placed, and stability was checked in full extension, mid-flexion, and deep flexion. Proper tibial rotation was determined and marked.  The patella tracked well without a lateral release. Trial components were then removed and tibial preparation performed.  The tibia was sized for a size 4 component.  A posterior capsular injection comprising of 20 cc of 1.3% exparel, 20 cc of 0.25% bupivicaine with epi and 20 cc of normal saline was performed for postoperative pain control. The bony surfaces were irrigated with a pulse lavage and then dried. Bone cement was vacuum mixed on the back table, and the final components sized above were cemented into  place. After cement had finished curing, excess cement was removed. The stability of the construct was  re-evaluated throughout a range of motion and found to be acceptable. The trial liner was removed, the knee was copiously irrigated, and the knee was re-evaluated for any excess bone debris. The real polyethylene liner, 9 mm thick, was inserted and checked to ensure the locking mechanism had engaged appropriately. The tourniquet was deflated and hemostasis was achieved. The wound was irrigated with normal saline.  One gram of vancomycin powder was placed in the surgical bed.  Capsular closure was performed with a #1 vicryl, subcutaneous fat closed with a 0 vicryl suture, then subcutaneous tissue closed with interrupted 2.0 vicryl suture. The skin was then closed with a 4.0 monocryl. A sterile dressing was applied.  The patient was awakened in the operating room and taken to recovery in stable condition. All sponge, needle, and instrument counts were correct at the end of the case.  Position: supine  Complications: none.  Time Out: performed   Drains/Packing: none  Estimated blood loss: minimal  Returned to Recovery Room: in good condition.   Antibiotics: yes   Mechanical VTE (DVT) Prophylaxis: sequential compression devices, TED thigh-high  Chemical VTE (DVT) Prophylaxis: aspirin  Fluid Replacement  Crystalloid: see anesthesia record Blood: none  FFP: none   Specimens Removed: 1 to pathology   Sponge and Instrument Count Correct? yes   PACU: portable radiograph - knee AP and Lateral   Plan/RTC: Return in 2 weeks for wound check.   Weight Bearing/Load Lower Extremity: full   N. Eduard Roux, MD [facility] 9:11 AM

## 2018-11-16 NOTE — Anesthesia Procedure Notes (Signed)
Anesthesia Regional Block: Adductor canal block   Pre-Anesthetic Checklist: ,, timeout performed, Correct Patient, Correct Site, Correct Laterality, Correct Procedure, Correct Position, site marked, Risks and benefits discussed,  Surgical consent,  Pre-op evaluation,  At surgeon's request and post-op pain management  Laterality: Left  Prep: chloraprep       Needles:  Injection technique: Single-shot  Needle Type: Echogenic Needle     Needle Length: 10cm  Needle Gauge: 21     Additional Needles:   Narrative:  Start time: 11/16/2018 7:02 AM End time: 11/16/2018 7:06 AM Injection made incrementally with aspirations every 5 mL.  Performed by: Personally  Anesthesiologist: Audry Pili, MD  Additional Notes: No pain on injection. No increased resistance to injection. Injection made in 5cc increments. Good needle visualization. Patient tolerated the procedure well.

## 2018-11-16 NOTE — Care Plan (Signed)
Ortho Bundle Case Management Note  Patient Details  Name: Ariel Harris MRN: [id number] Date of Birth: 1945/08/16  [facility] met with patient while she was in office at last appointment with Dr. Erlinda Hong. She is currently scheduled for a Left TKA on 11/16/18. She has family that can assist after surgery. Anticipate home health PT after short hospital stay. Choice provided. Referral made to [facility] for home health. Patient requested OPPT with [facility] on [address]. Patient has a FWW, but will need a BSC/3in1. Referral to [facility] for DME. Will continue to follow for all CM needs after surgery                  DME Arranged:  3-N-1 DME Agency:  [facility]  HH Arranged:  PT [place]:  [facility] (now [facility])  Additional Comments: Please contact me with any questions of if this plan should need to change.  Jamse Arn, RN, BSN, [employer]  [phone] 11/16/2018, 11:30 AM

## 2018-11-16 NOTE — Evaluation (Signed)
Physical Therapy Evaluation Patient Details Name: Genavee Firebaugh MRN: [id number] DOB: 1945/10/12 Today's Date: 11/16/2018   History of Present Illness  Pt is a 73 y/o female s/p L TKA. PMH includes HTN and R TKA.  Clinical Impression  Pt is s/p surgery above with deficits below. Pt requiring min guard A for mobility tasks using RW. Was unable to use interpreter this session as iPad and telephone interpreters not available in evening and in person interpreter had left. Pt was able to understand basic english. Will need review on knee precautions and to clarify home info when interpreter available. Will continue to follow acutely to maximize functional mobility independence and safety.     Follow Up Recommendations Follow surgeon's recommendation for DC plan and follow-up therapies    Equipment Recommendations  3in1 (PT)    Recommendations for Other Services       Precautions / Restrictions Precautions Precautions: Knee Precaution Booklet Issued: No Restrictions Weight Bearing Restrictions: Yes LLE Weight Bearing: Weight bearing as tolerated      Mobility  Bed Mobility Overal bed mobility: Needs Assistance Bed Mobility: Supine to Sit     Supine to sit: Min assist     General bed mobility comments: Min A for trunk elevation and scooting hips to EOB.   Transfers Overall transfer level: Needs assistance Equipment used: Rolling walker (2 wheeled) Transfers: Sit to/from Stand Sit to Stand: Min guard         General transfer comment: Min guard for safety. Cues for safe hand placement.   Ambulation/Gait Ambulation/Gait assistance: Min guard Gait Distance (Feet): 5 Feet Assistive device: Rolling walker (2 wheeled) Gait Pattern/deviations: Step-to pattern;Decreased step length - right;Decreased step length - left;Decreased weight shift to left;Antalgic Gait velocity: Decreased   General Gait Details: Slow, mildly antalgic gait. Cues for sequencing using RW.   Stairs             Wheelchair Mobility    Modified Rankin (Stroke Patients Only)       Balance Overall balance assessment: Needs assistance Sitting-balance support: No upper extremity supported;Feet supported Sitting balance-Leahy Scale: Good     Standing balance support: Bilateral upper extremity supported;During functional activity Standing balance-Leahy Scale: Poor Standing balance comment: Reliant on BUE support                              Pertinent Vitals/Pain Pain Assessment: Faces Faces Pain Scale: Hurts little more Pain Location: L knee  Pain Descriptors / Indicators: Aching;Operative site guarding Pain Intervention(s): Limited activity within patient's tolerance;Monitored during session;Repositioned    Home Living Family/patient expects to be discharged to:: Private residence Living Arrangements: Other relatives(grandaughter) Available Help at Discharge: Family Type of Home: House Home Access: Stairs to enter Entrance Stairs-Rails: Right;Left;Can reach both Entrance Stairs-Number of Steps: 5 Home Layout: One level Home Equipment: Walker - 2 wheels Additional Comments: Will need to confirm home set up when interpreter available.     Prior Function Level of Independence: Independent               Hand Dominance        Extremity/Trunk Assessment   Upper Extremity Assessment Upper Extremity Assessment: Overall WFL for tasks assessed    Lower Extremity Assessment Lower Extremity Assessment: LLE deficits/detail LLE Deficits / Details: Deficits consistent with post op pain and weakness.     Cervical / Trunk Assessment Cervical / Trunk Assessment: Normal  Communication   Communication:  Prefers language other than English(no interpreter available on ipad or phone or in person)  Cognition Arousal/Alertness: Awake/alert Behavior During Therapy: WFL for tasks assessed/performed Overall Cognitive Status: Within Functional Limits for tasks  assessed                                        General Comments General comments (skin integrity, edema, etc.): No interpreter available, however, pt understands some english. Will need to confirm information in next session when interpreter available.     Exercises     Assessment/Plan    PT Assessment Patient needs continued PT services  PT Problem List Decreased strength;Decreased balance;Decreased mobility;Decreased range of motion;Decreased knowledge of use of DME;Decreased knowledge of precautions;Pain       PT Treatment Interventions DME instruction;Gait training;Stair training;Functional mobility training;Therapeutic activities;Therapeutic exercise;Balance training;Patient/family education    PT Goals (Current goals can be found in the Care Plan section)  Acute Rehab PT Goals Patient Stated Goal: to go to the bathroom  PT Goal Formulation: With patient Time For Goal Achievement: 11/30/18 Potential to Achieve Goals: Good    Frequency 7X/week   Barriers to discharge        Co-evaluation               AM-PAC PT "6 Clicks" Mobility  Outcome Measure Help needed turning from your back to your side while in a flat bed without using bedrails?: A Little Help needed moving from lying on your back to sitting on the side of a flat bed without using bedrails?: A Little Help needed moving to and from a bed to a chair (including a wheelchair)?: A Little Help needed standing up from a chair using your arms (e.g., wheelchair or bedside chair)?: A Little Help needed to walk in hospital room?: A Little Help needed climbing 3-5 steps with a railing? : A Lot 6 Click Score: 17    End of Session Equipment Utilized During Treatment: Gait belt Activity Tolerance: Patient tolerated treatment well Patient left: in chair;with call bell/phone within reach Nurse Communication: Mobility status PT Visit Diagnosis: Other abnormalities of gait and mobility  (R26.89);Pain Pain - Right/Left: Left Pain - part of body: Knee    Time: [id number] PT Time Calculation (min) (ACUTE ONLY): 25 min   Charges:   PT Evaluation $PT Eval Low Complexity: 1 Low PT Treatments $Therapeutic Activity: 8-22 mins        Leighton Ruff, PT, DPT  Acute Rehabilitation Services  Pager: [phone] Office: [phone]   Rudean Hitt 11/16/2018, 6:13 PM

## 2018-11-16 NOTE — Discharge Instructions (Signed)

## 2018-11-16 NOTE — Anesthesia Procedure Notes (Signed)
Spinal  Patient location during procedure: OR Start time: 11/16/2018 7:33 AM End time: 11/16/2018 7:37 AM Staffing Anesthesiologist: Audry Pili, MD Performed: anesthesiologist  Preanesthetic Checklist Completed: patient identified, surgical consent, pre-op evaluation, timeout performed, IV checked, risks and benefits discussed and monitors and equipment checked Spinal Block Patient position: sitting Prep: DuraPrep Patient monitoring: heart rate, cardiac monitor, continuous pulse ox and blood pressure Approach: midline Location: L3-4 Injection technique: single-shot Needle Needle type: Pencan  Needle gauge: 24 G Additional Notes Consent was obtained prior to the procedure with all questions answered and concerns addressed. Risks including, but not limited to, bleeding, infection, nerve damage, paralysis, failed block, inadequate analgesia, allergic reaction, high spinal, itching, and headache were discussed and the patient wished to proceed. Functioning IV was confirmed and monitors were applied. Sterile prep and drape, including hand hygiene, mask, and sterile gloves were used. The patient was positioned and the spine was prepped. The skin was anesthetized with lidocaine. Free flow of clear CSF was obtained prior to injecting local anesthetic into the CSF. The spinal needle aspirated freely following injection. The needle was carefully withdrawn. The patient tolerated the procedure well.   Renold Don, MD

## 2018-11-16 NOTE — Anesthesia Postprocedure Evaluation (Signed)
Anesthesia Post Note  Patient: Ariel Harris  Procedure(s) Performed: LEFT TOTAL KNEE ARTHROPLASTY (Left Knee)     Patient location during evaluation: PACU Anesthesia Type: Spinal Level of consciousness: awake and alert Pain management: pain level controlled Vital Signs Assessment: post-procedure vital signs reviewed and stable Respiratory status: spontaneous breathing and respiratory function stable Cardiovascular status: blood pressure returned to baseline, stable and bradycardic Postop Assessment: spinal receding and no apparent nausea or vomiting Anesthetic complications: no    Last Vitals:  Vitals:   11/16/18 1035 11/16/18 1050  BP: (!) 141/85 138/79  Pulse: (!) 51 (!) 47  Resp: 12 12  Temp:  (!) 36.4 C  SpO2: 99% 98%    Last Pain:  Vitals:   11/16/18 1050  TempSrc:   PainSc: 0-No pain                 Audry Pili

## 2018-11-16 NOTE — Anesthesia Procedure Notes (Signed)
Procedure Name: MAC Date/Time: 11/16/2018 7:45 AM Performed by: Marsa Aris, CRNA Pre-anesthesia Checklist: Patient identified, Emergency Drugs available, Suction available, Patient being monitored and Timeout performed Patient Re-evaluated:Patient Re-evaluated prior to induction Oxygen Delivery Method: Simple face mask Preoxygenation: Pre-oxygenation with 100% oxygen

## 2018-11-16 NOTE — Transfer of Care (Signed)
Immediate Anesthesia Transfer of Care Note  Patient: Ariel Harris  Procedure(s) Performed: LEFT TOTAL KNEE ARTHROPLASTY (Left Knee)  Patient Location: PACU  Anesthesia Type:MAC and Spinal  Level of Consciousness: awake, alert  and oriented  Airway & Oxygen Therapy: Patient Spontanous Breathing  Post-op Assessment: Report given to RN and Post -op Vital signs reviewed and stable  Post vital signs: Reviewed and stable  Last Vitals:  Vitals Value Taken Time  BP 126/78 11/16/18 0950  Temp 36.4 C 11/16/18 0950  Pulse 57 11/16/18 0952  Resp 14 11/16/18 0952  SpO2 100 % 11/16/18 0952  Vitals shown include unvalidated device data.  Last Pain:  Vitals:   11/16/18 0616  TempSrc: Oral  PainSc: 5       Patients Stated Pain Goal: 3 (83/37/44 5146)  Complications: No apparent anesthesia complications

## 2018-11-16 NOTE — Progress Notes (Signed)
Orthopedic Tech Progress Note Patient Details:  Ariel Harris [birth year] [id number] Applied CPM to patient CPM Left Knee CPM Left Knee: On Left Knee Flexion (Degrees): 0 Left Knee Extension (Degrees): 90 Additional Comments: added ice to knee  Post Interventions Patient Tolerated: Well Instructions Provided: Care of device, Adjustment of device  Janit Pagan 11/16/2018, 10:37 AM

## 2018-11-16 NOTE — Plan of Care (Signed)
  Problem: Clinical Measurements: Goal: Respiratory complications will improve Outcome: Progressing   Problem: Activity: Goal: Risk for activity intolerance will decrease Outcome: Progressing   Problem: Nutrition: Goal: Adequate nutrition will be maintained Outcome: Progressing   Problem: Coping: Goal: Level of anxiety will decrease Outcome: Progressing   Problem: Pain Managment: Goal: General experience of comfort will improve Outcome: Progressing   Problem: Safety: Goal: Ability to remain free from injury will improve Outcome: Progressing   

## 2018-11-16 NOTE — H&P (Signed)
PREOPERATIVE H&P  Chief Complaint: left knee degenerative joint disease  HPI: Ariel Harris is a 73 y.o. female who presents for surgical treatment of left knee degenerative joint disease.  She denies any changes in medical history.  Past Medical History:  Diagnosis Date  . Arthritis    "knees" (05/16/2017)  . Chest pain   . Hyperlipidemia   . Hypertension   . PAC (premature atrial contraction)   . SVT (supraventricular tachycardia) (HCC)    Past Surgical History:  Procedure Laterality Date  . JOINT REPLACEMENT    . KNEE ARTHROSCOPY Left 1996  . KNEE SURGERY Right [phone]   "open surgery; because of the pain"  . 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 TOTAL KNEE ARTHROPLASTY Right 05/16/2017   Procedure: RIGHT TOTAL KNEE ARTHROPLASTY;  Surgeon: Leandrew Koyanagi, MD;  Location: [facility];  Service: Orthopedics;  Laterality: Right;   Social History   Socioeconomic History  . Marital status: Divorced    Spouse name: Not on file  . Number of children: 2  . Years of education: Not on file  . Highest education level: Not on file  Occupational History  . Not on file  Social Needs  . Financial resource strain: Not on file  . Food insecurity    Worry: Not on file    Inability: Not on file  . Transportation needs    Medical: Not on file    Non-medical: Not on file  Tobacco Use  . Smoking status: Never Smoker  . Smokeless tobacco: Never Used  Substance and Sexual Activity  . Alcohol use: No  . Drug use: No  . Sexual activity: Not Currently  Lifestyle  . Physical activity    Days per week: Not on file    Minutes per session: Not on file  . Stress: Not on file  Relationships  . Social Herbalist on phone: Not on file    Gets together: Not on file    Attends religious service: Not on file    Active member of club or organization: Not on file    Attends meetings of clubs or organizations: Not on file    Relationship status: Not on file  Other Topics  Concern  . Not on file  Social History Narrative   Negative family `history--Specifically artery disease or MI. The patient has two siblings died at a young age from fevers.   The patient is separated and single. She is retired but used to work at a [employer] and a [employer]. She is independent of daily living. She has never drink or smoke or done drugs   Family History  Problem Relation Age of Onset  . Coronary artery disease Neg Hx   . Heart attack Neg Hx   . Breast cancer Neg Hx    Allergies  Allergen Reactions  . Indomethacin Swelling  . Tramadol     Sweaty, made feel bad   Prior to Admission medications   Medication Sig Start Date End Date Taking? Authorizing Provider  calcium-vitamin D (OSCAL WITH D) 500-200 MG-UNIT tablet Take 1 tablet by mouth daily with breakfast.   Yes [provider]  metoprolol succinate (TOPROL XL) 25 MG 24 hr tablet Take 1 tablet by mouth daily, may take 1 extra tablet by mouth daily only as needed for palpitations Patient taking differently: Take 25 mg by mouth daily. may take 1 extra tablet by mouth daily only as needed for palpitations 07/09/18  Yes Corum, Rex Kras, MD  pravastatin (PRAVACHOL) 40 MG tablet Take 1 tablet (40 mg total) by mouth daily. Patient taking differently: Take 40 mg by mouth at bedtime.  07/09/18  Yes Corum, Rex Kras, MD  betamethasone valerate (VALISONE) 0.1 % cream Apply to affected area 1-2 times daily as needed. Patient not taking: Reported on 11/06/2018 04/30/18   Aundra Dubin, PA-C  fluticasone (CUTIVATE) 0.05 % cream Please specify directions, refills and quantity Patient not taking: Reported on 11/06/2018 01/02/18   Leandrew Koyanagi, MD     Positive ROS: All other systems have been reviewed and were otherwise negative with the exception of those mentioned in the HPI and as above.  Physical Exam: General: Alert, no acute distress Cardiovascular: No pedal edema Respiratory: No cyanosis, no use of accessory musculature  GI: abdomen soft Skin: No lesions in the area of chief complaint Neurologic: Sensation intact distally Psychiatric: Patient is competent for consent with normal mood and affect Lymphatic: no lymphedema  MUSCULOSKELETAL: exam stable  Assessment: left knee degenerative joint disease  Plan: Plan for Procedure(s): LEFT TOTAL KNEE ARTHROPLASTY  The risks benefits and alternatives were discussed with the patient including but not limited to the risks of nonoperative treatment, versus surgical intervention including infection, bleeding, nerve injury,  blood clots, cardiopulmonary complications, morbidity, mortality, among others, and they were willing to proceed.   Preoperative templating of the joint replacement has been completed, documented, and submitted to the Operating Room personnel in order to optimize intra-operative equipment management.  Patient's anticipated LOS is less than 2 midnights, meeting these requirements: - Younger than 41 - Lives within 1 hour of care - Has a competent adult at home to recover with post-op recover - NO history of  - Chronic pain requiring opiods  - Diabetes  - Coronary Artery Disease  - Heart failure  - Heart attack  - Stroke  - DVT/VTE  - Cardiac arrhythmia  - Respiratory Failure/COPD  - Renal failure  - Anemia  - Advanced Liver disease  Eduard Roux, MD   11/16/2018 7:14 AM

## 2018-11-17 ENCOUNTER — Encounter (HOSPITAL_COMMUNITY): Payer: Self-pay | Admitting: Orthopaedic Surgery

## 2018-11-17 DIAGNOSIS — I10 Essential (primary) hypertension: Secondary | ICD-10-CM | POA: Diagnosis not present

## 2018-11-17 DIAGNOSIS — M1712 Unilateral primary osteoarthritis, left knee: Secondary | ICD-10-CM | POA: Diagnosis not present

## 2018-11-17 DIAGNOSIS — Z96659 Presence of unspecified artificial knee joint: Secondary | ICD-10-CM

## 2018-11-17 DIAGNOSIS — Z79899 Other long term (current) drug therapy: Secondary | ICD-10-CM | POA: Diagnosis not present

## 2018-11-17 DIAGNOSIS — E785 Hyperlipidemia, unspecified: Secondary | ICD-10-CM | POA: Diagnosis not present

## 2018-11-17 DIAGNOSIS — Z888 Allergy status to other drugs, medicaments and biological substances status: Secondary | ICD-10-CM | POA: Diagnosis not present

## 2018-11-17 DIAGNOSIS — Z96651 Presence of right artificial knee joint: Secondary | ICD-10-CM | POA: Diagnosis not present

## 2018-11-17 LAB — BASIC METABOLIC PANEL
Anion gap: 6 (ref 5–15)
BUN: 16 mg/dL (ref 8–23)
CO2: 24 mmol/L (ref 22–32)
Calcium: 8.2 mg/dL — ABNORMAL LOW (ref 8.9–10.3)
Chloride: 108 mmol/L (ref 98–111)
Creatinine, Ser: 0.83 mg/dL (ref 0.44–1.00)
GFR calc Af Amer: >60 mL/min (ref >60)
GFR calc non Af Amer: >60 mL/min (ref >60)
Glucose, Bld: 127 mg/dL — ABNORMAL HIGH (ref 70–99)
Potassium: 4.6 mmol/L (ref 3.5–5.1)
Sodium: 138 mmol/L (ref 135–145)

## 2018-11-17 LAB — CBC
HCT: 40.1 % (ref 36.0–46.0)
Hemoglobin: 12.7 g/dL (ref 12.0–15.0)
MCH: 29.1 pg (ref 26.0–34.0)
MCHC: 31.7 g/dL (ref 30.0–36.0)
MCV: 91.8 fL (ref 80.0–100.0)
Platelets: 165 10*3/uL (ref 150–400)
RBC: 4.37 MIL/uL (ref 3.87–5.11)
RDW: 12.6 % (ref 11.5–15.5)
WBC: 7.1 10*3/uL (ref 4.0–10.5)
nRBC: 0 % (ref 0.0–0.2)

## 2018-11-17 NOTE — Plan of Care (Signed)

## 2018-11-17 NOTE — Progress Notes (Signed)
Physical Therapy Treatment Patient Details Name: Ariel Harris MRN: [id number] DOB: May 02, 1945 Today's Date: 11/17/2018    History of Present Illness Pt is a 73 y/o female s/p L TKA. PMH includes HTN and R TKA.    PT Comments    Continuing work on functional mobility and activity tolerance;  Very good progress with gait distance and initial work on stair training; at one point she lead going up the steps with her LLE -- possibly related to the instructions when her R knee was replaced recently; discussed car transfers; on track for dc home tomorrow   Follow Up Recommendations  Follow surgeon's recommendation for DC plan and follow-up therapies     Equipment Recommendations  3in1 (PT)    Recommendations for Other Services       Precautions / Restrictions Precautions Precautions: Knee Precaution Comments: Reinforced no pillow under knee Restrictions LLE Weight Bearing: Weight bearing as tolerated    Mobility  Bed Mobility Overal bed mobility: Needs Assistance Bed Mobility: Supine to Sit     Supine to sit: Supervision     General bed mobility comments: Supervision to get to EOB for safety; overall no difficulty  Transfers Overall transfer level: Needs assistance Equipment used: Rolling walker (2 wheeled) Transfers: Sit to/from Stand Sit to Stand: Supervision         General transfer comment: Supervision for safety  Ambulation/Gait Ambulation/Gait assistance: Supervision Gait Distance (Feet): 120 Feet(x2) Assistive device: Rolling walker (2 wheeled) Gait Pattern/deviations: Step-through pattern(emerging) Gait velocity: Decreased   General Gait Details: Cues to stand tall on LLE in stance and activate quad for knee stability; no buckling noted   Stairs Stairs: Yes Stairs assistance: Min guard Stair Management: Two rails;Step to pattern;Forwards Number of Stairs: 5 General stair comments: Cues for sequence; tends to ascend steps step-over-step technique,  which makes her quite dependent on teh rails; Told her that while leading with the LLE to go up won't cause damage, it may be more comfortable going up one steps at a time, leading with the stronger leg   Wheelchair Mobility    Modified Rankin (Stroke Patients Only)       Balance     Sitting balance-Leahy Scale: Good       Standing balance-Leahy Scale: Fair                              Cognition Arousal/Alertness: Awake/alert Behavior During Therapy: WFL for tasks assessed/performed Overall Cognitive Status: Within Functional Limits for tasks assessed                                        Exercises Total Joint Exercises Quad Sets: AROM;Left;5 reps Short Arc Quad: AROM;Left;10 reps Heel Slides: AAROM;Left;10 reps Straight Leg Raises: AROM;Left;10 reps Goniometric ROM: approx 4-90 degrees    General Comments General comments (skin integrity, edema, etc.): Mitzi Hansen, Technical brewer assisted with communication      Pertinent Vitals/Pain Pain Assessment: 0-10 Pain Score: 4  Faces Pain Scale: Hurts a little bit Pain Location: L knee  Pain Descriptors / Indicators: Aching;Operative site guarding Pain Intervention(s): Monitored during session    Home Living                      Prior Function            PT  Goals (current goals can now be found in the care plan section) Acute Rehab PT Goals Patient Stated Goal: is looking forward to going home PT Goal Formulation: With patient Time For Goal Achievement: 11/30/18 Potential to Achieve Goals: Good Progress towards PT goals: Progressing toward goals    Frequency    7X/week      PT Plan Current plan remains appropriate    Co-evaluation              AM-PAC PT "6 Clicks" Mobility   Outcome Measure  Help needed turning from your back to your side while in a flat bed without using bedrails?: None Help needed moving from lying on your back to sitting on the side of  a flat bed without using bedrails?: None Help needed moving to and from a bed to a chair (including a wheelchair)?: A Little Help needed standing up from a chair using your arms (e.g., wheelchair or bedside chair)?: A Little Help needed to walk in hospital room?: None Help needed climbing 3-5 steps with a railing? : A Little 6 Click Score: 21    End of Session Equipment Utilized During Treatment: Gait belt Activity Tolerance: Patient tolerated treatment well Patient left: in bed;with call bell/phone within reach Nurse Communication: Mobility status PT Visit Diagnosis: Other abnormalities of gait and mobility (R26.89);Pain Pain - Right/Left: Left Pain - part of body: Knee     Time: [id number] PT Time Calculation (min) (ACUTE ONLY): 31 min  Charges:  $Gait Training: 23-37 mins $Therapeutic Exercise: 8-22 mins $Therapeutic Activity: 8-22 mins                     Roney Marion, PT  Acute Rehabilitation Services Pager [phone] Office [place] 11/17/2018, 3:04 PM

## 2018-11-17 NOTE — TOC Initial Note (Signed)
Transition of Care [facility]) - Initial/Assessment Note    Patient Details  Name: Ariel Harris MRN: [id number] Date of Birth: 1945-06-06  Transition of Care [facility]) CM/SW Contact:    Ariel Favre, RN Phone Number: 11/17/2018, 12:24 PM  Clinical Narrative:                 Discussed discharge planning with Ariel Harris grand daughter. Patient from home with family, patient has had knee surgery in past and had [facility] and prefers same HHPT. ( [facility] to [place] [facility]). Patient already has walker and 3 in 1   Expected Discharge Plan: [place] Barriers to Discharge: Continued Medical Work up   Patient Goals and CMS Choice Patient states their goals for this hospitalization and ongoing recovery are:: to go home CMS Medicare.gov Compare Post Acute Care list provided to:: Patient Choice offered to / list presented to : Patient  Expected Discharge Plan and Services Expected Discharge Plan: [place]   Discharge Planning Services: CM Consult Post Acute Care Choice: [place] arrangements for the past 2 months: Single Family Home                 DME Arranged: N/A DME Agency: [employer]       HH Arranged: PT [place] Agency: NA        Prior Living Arrangements/Services Living arrangements for the past 2 months: Single Family Home Lives with:: Relatives Patient language and need for interpreter reviewed:: Yes        Need for Family Participation in Patient Care: Yes (Comment) Care giver support system in place?: Yes (comment) Current home services: DME Criminal Activity/Legal Involvement Pertinent to Current Situation/Hospitalization: No - Comment as needed  Activities of Daily Living Home Assistive Devices/Equipment: Dentures (specify type) ADL Screening (condition at time of admission) Patient's cognitive ability adequate to safely complete daily activities?: Yes Is the patient deaf or have difficulty  hearing?: No Does the patient have difficulty seeing, even when wearing glasses/contacts?: No Does the patient have difficulty concentrating, remembering, or making decisions?: No Patient able to express need for assistance with ADLs?: Yes Does the patient have difficulty dressing or bathing?: No Independently performs ADLs?: Yes (appropriate for developmental age) Does the patient have difficulty walking or climbing stairs?: Yes Weakness of Legs: Left Weakness of Arms/Hands: Both  Permission Sought/Granted Permission sought to share information with : Case Manager Permission granted to share information with : Yes, Verbal Permission Granted  Share Information with NAME: Ariel Harris 60 [place] granted to share info w AGENCY: [facility]        Emotional Assessment Appearance:: Appears stated age     Orientation: : Oriented to Self, Oriented to Place, Oriented to  Time, Oriented to Situation Alcohol / Substance Use: Not Applicable Psych Involvement: No (comment)  Admission diagnosis:  left knee degenerative joint disease Patient Active Problem List   Diagnosis Date Noted  . 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 SVT (supraventricular tachycardia) ([place]) 07/09/2018  . Unilateral primary osteoarthritis, left knee 10/28/2017  . Hyperlipidemia 05/25/2017  . Osteoporosis 05/25/2017  . Status post total right knee replacement 05/16/2017  . Unilateral primary osteoarthritis, right knee 11/01/2016  . History of PSVT 01/13/2014  . Chest pain 03/28/2011  . Palpitations 03/12/2011  . Dyslipidemia 01/12/2010  . Hypertension 01/12/2010  . CHEST PAIN, ATYPICAL 01/12/2010   PCP:  Antonietta Jewel,  MD Pharmacy:   [id number] - [place], [place] [address] Phone: [phone] Fax: [phone] [place], [place] [id number] [place] [id number] Phone: [phone] Fax: [phone]     Social Determinants of Health (SDOH) Interventions    Readmission Risk Interventions No flowsheet data found.

## 2018-11-17 NOTE — Progress Notes (Signed)
Physical Therapy Treatment Patient Details Name: Ariel Harris MRN: [id number] DOB: 03-Aug-1945 Today's Date: 11/17/2018    History of Present Illness Pt is a 73 y/o female s/p L TKA. PMH includes HTN and R TKA.    PT Comments    Continuing work on functional mobility and activity tolerance;  Noting excellent progress with functional mobility, and progressive ambulation; Needs reinforcement of no pillow under knee precaution; On track for dc home tomorrow; Husband and 62yo granddaughter can provide assistance; has RW from previous RTKA   Follow Up Recommendations  Follow surgeon's recommendation for DC plan and follow-up therapies     Equipment Recommendations  3in1 (PT)    Recommendations for Other Services       Precautions / Restrictions Precautions Precautions: Knee Restrictions Weight Bearing Restrictions: Yes LLE Weight Bearing: Weight bearing as tolerated    Mobility  Bed Mobility Overal bed mobility: Needs Assistance Bed Mobility: Supine to Sit     Supine to sit: Supervision     General bed mobility comments: Supervision to get to EOB for safety; overall no difficulty  Transfers Overall transfer level: Needs assistance Equipment used: Rolling walker (2 wheeled) Transfers: Sit to/from Stand Sit to Stand: Min guard         General transfer comment: Min guard for safety. Cues for safe hand placement.   Ambulation/Gait Ambulation/Gait assistance: Min guard Gait Distance (Feet): 120 Feet Assistive device: Rolling walker (2 wheeled) Gait Pattern/deviations: Step-through pattern(emerging) Gait velocity: Decreased   General Gait Details: Cues to stand tall on LLE in stance and activate quad for knee stability; no buckling noted   Stairs             Wheelchair Mobility    Modified Rankin (Stroke Patients Only)       Balance                                            Cognition Arousal/Alertness: Awake/alert Behavior  During Therapy: WFL for tasks assessed/performed Overall Cognitive Status: Within Functional Limits for tasks assessed                                        Exercises Total Joint Exercises Quad Sets: AROM;Left;5 reps Short Arc Quad: AROM;Left;10 reps Heel Slides: AAROM;Left;10 reps Straight Leg Raises: AROM;Left;10 reps Goniometric ROM: approx 4-90 degrees    General Comments General comments (skin integrity, edema, etc.): Hien, Video Interpreter [phone]) facilitated communication      Pertinent Vitals/Pain Pain Assessment: Faces Faces Pain Scale: Hurts a little bit Pain Location: L knee  Pain Descriptors / Indicators: Aching;Operative site guarding Pain Intervention(s): Monitored during session    Home Living                      Prior Function            PT Goals (current goals can now be found in the care plan section) Acute Rehab PT Goals Patient Stated Goal: to go to the bathroom  PT Goal Formulation: With patient Time For Goal Achievement: 11/30/18 Potential to Achieve Goals: Good Progress towards PT goals: Progressing toward goals    Frequency    7X/week      PT Plan Current plan remains appropriate    Co-evaluation  AM-PAC PT "6 Clicks" Mobility   Outcome Measure  Help needed turning from your back to your side while in a flat bed without using bedrails?: None Help needed moving from lying on your back to sitting on the side of a flat bed without using bedrails?: None Help needed moving to and from a bed to a chair (including a wheelchair)?: A Little Help needed standing up from a chair using your arms (e.g., wheelchair or bedside chair)?: A Little Help needed to walk in hospital room?: None Help needed climbing 3-5 steps with a railing? : A Lot 6 Click Score: 20    End of Session Equipment Utilized During Treatment: Gait belt Activity Tolerance: Patient tolerated treatment well Patient left: in  chair;with call bell/phone within reach Nurse Communication: Mobility status PT Visit Diagnosis: Other abnormalities of gait and mobility (R26.89);Pain Pain - Right/Left: Left Pain - part of body: Knee     Time: 0918-1000 PT Time Calculation (min) (ACUTE ONLY): 42 min  Charges:  $Gait Training: 8-22 mins $Therapeutic Exercise: 8-22 mins $Therapeutic Activity: 8-22 mins                     Roney Marion, PT  Acute Rehabilitation Services Pager [phone] Office [place] 11/17/2018, 12:25 PM

## 2018-11-17 NOTE — Progress Notes (Addendum)
Subjective: 1 Day Post-Op Procedure(s) (LRB): LEFT TOTAL KNEE ARTHROPLASTY (Left) Patient reports pain as mild.    Objective: Vital signs in last 24 hours: Temp:  [97.5 F (36.4 C)-98.6 F (37 C)] 97.9 F (36.6 C) (10/20 0314) Pulse Rate:  [47-66] 55 (10/20 0314) Resp:  [12-18] 16 (10/20 0314) BP: (101-141)/(57-86) 106/64 (10/20 0314) SpO2:  [95 %-100 %] 97 % (10/20 0314)  Intake/Output from previous day: 10/19 0701 - 10/20 0700 In: 1603.2 [P.O.:360; I.V.:1143.2; IV Piggyback:100] Out: 1850 [Urine:1750; Blood:100] Intake/Output this shift: No intake/output data recorded.  Recent Labs    11/17/18 0413  HGB 12.7   Recent Labs    11/17/18 0413  WBC 7.1  RBC 4.37  HCT 40.1  PLT 165   Recent Labs    11/17/18 0413  NA 138  K 4.6  CL 108  CO2 24  BUN 16  CREATININE 0.83  GLUCOSE 127*  CALCIUM 8.2*   No results for input(s): LABPT, INR in the last 72 hours.  Neurologically intact Neurovascular intact Sensation intact distally Intact pulses distally Dorsiflexion/Plantar flexion intact Incision: dressing C/D/I No cellulitis present Compartment soft   Assessment/Plan: 1 Day Post-Op Procedure(s) (LRB): LEFT TOTAL KNEE ARTHROPLASTY (Left) Advance diet Up with therapy D/C IV fluids Plan for discharge tomorrow home with hhpt WBAT LLE D/c foley    Anticipated LOS equal to or greater than 2 midnights due to - Age 71 and older with one or more of the following:  - Obesity  - Expected need for hospital services (PT, OT, Nursing) required for safe  discharge  - Anticipated need for postoperative skilled nursing care or inpatient rehab  - Active co-morbidities: None OR   - Unanticipated findings during/Post Surgery: Slow post-op progression: GI, pain control, mobility  - Patient is a high risk of re-admission due to: None    Ariel Harris 11/17/2018, 7:26 AM

## 2018-11-17 NOTE — Plan of Care (Signed)

## 2018-11-18 NOTE — Plan of Care (Signed)

## 2018-11-18 NOTE — Discharge Summary (Signed)
Patient ID: Ariel Harris MRN: [id number] DOB/AGE: February 15, 1945 73 y.o.  Admit date: 11/16/2018 Discharge date: 11/18/2018  Admission Diagnoses:  Principal Problem:   Unilateral primary osteoarthritis, left knee Active Problems:   Status post total left knee replacement   S/P total knee arthroplasty   Discharge Diagnoses:  Same  Past Medical History:  Diagnosis Date  . Arthritis    "knees" (05/16/2017)  . Chest pain   . Hyperlipidemia   . Hypertension   . PAC (premature atrial contraction)   . SVT (supraventricular tachycardia) (HCC)     Surgeries: Procedure(s): LEFT TOTAL KNEE ARTHROPLASTY on 11/16/2018   Consultants:   Discharged Condition: Improved  Hospital Course: Ariel Harris is an 73 y.o. female who was admitted 11/16/2018 for operative treatment ofUnilateral primary osteoarthritis, left knee. Patient has severe unremitting pain that affects sleep, daily activities, and work/hobbies. After pre-op clearance the patient was taken to the operating room on 11/16/2018 and underwent  Procedure(s): LEFT TOTAL KNEE ARTHROPLASTY.    Patient was given perioperative antibiotics:  Anti-infectives (From admission, onward)   Start     Dose/Rate Route Frequency Ordered Stop   11/16/18 1400  ceFAZolin (ANCEF) IVPB 2g/100 mL premix     2 g 200 mL/hr over 30 Minutes Intravenous Every 6 hours 11/16/18 1138 11/17/18 0135   11/16/18 0905  vancomycin (VANCOCIN) powder  Status:  Discontinued       As needed 11/16/18 0906 11/16/18 0947   11/16/18 0630  ceFAZolin (ANCEF) IVPB 2g/100 mL premix     2 g 200 mL/hr over 30 Minutes Intravenous To [facility] 11/13/18 1235 11/16/18 0755       Patient was given sequential compression devices, early ambulation, and chemoprophylaxis to prevent DVT.  Patient benefited maximally from hospital stay and there were no complications.    Recent vital signs:  Patient Vitals for the past 24 hrs:  BP Temp Temp src Pulse Resp SpO2   11/18/18 0407 134/88 (!) 97.4 F (36.3 C) Oral (!) 59 (P) 18 98 %  11/17/18 2001 (!) 167/83 98.3 F (36.8 C) Oral 66 17 99 %  11/17/18 1543 134/80 97.8 F (36.6 C) Oral (!) 59 15 97 %  11/17/18 1147 131/70 98 F (36.7 C) Oral (!) 51 14 98 %  11/17/18 1009 106/65 - - (!) 56 - -  11/17/18 0822 (!) 91/59 98 F (36.7 C) Oral 66 16 97 %     Recent laboratory studies:  Recent Labs    11/17/18 0413  WBC 7.1  HGB 12.7  HCT 40.1  PLT 165  NA 138  K 4.6  CL 108  CO2 24  BUN 16  CREATININE 0.83  GLUCOSE 127*  CALCIUM 8.2*     Discharge Medications:   Allergies as of 11/18/2018      Reactions   Indomethacin Swelling   Tramadol    Sweaty, made feel bad      Medication List    STOP taking these medications   betamethasone valerate 0.1 % cream Commonly known as: VALISONE   fluticasone 0.05 % cream Commonly known as: CUTIVATE     TAKE these medications   aspirin EC 81 MG tablet Take 1 tablet (81 mg total) by mouth 2 (two) times daily.   calcium-vitamin D 500-200 MG-UNIT tablet Commonly known as: OSCAL WITH D Take 1 tablet by mouth daily with breakfast.   methocarbamol 750 MG tablet Commonly known as: ROBAXIN Take 1 tablet (750 mg total) by mouth  2 (two) times daily as needed for muscle spasms.   metoprolol succinate 25 MG 24 hr tablet Commonly known as: Toprol XL Take 1 tablet by mouth daily, may take 1 extra tablet by mouth daily only as needed for palpitations What changed:   how much to take  how to take this  when to take this  additional instructions   ondansetron 4 MG tablet Commonly known as: ZOFRAN Take 1-2 tablets (4-8 mg total) by mouth every 8 (eight) hours as needed for nausea or vomiting.   oxyCODONE 5 MG immediate release tablet Commonly known as: Oxy IR/ROXICODONE Take 1-2 tablets (5-10 mg total) by mouth every 8 (eight) hours as needed for severe pain.   oxyCODONE 10 mg 12 hr tablet Commonly known as: OXYCONTIN Take 1 tablet (10  mg total) by mouth every 12 (twelve) hours for 3 days.   pravastatin 40 MG tablet Commonly known as: PRAVACHOL Take 1 tablet (40 mg total) by mouth daily. What changed: when to take this   senna-docusate 8.6-50 MG tablet Commonly known as: Senokot S Take 1-2 tablets by mouth at bedtime as needed.            Durable Medical Equipment  (From admission, onward)         Start     Ordered   11/16/18 1139  DME Walker rolling  Once    Question:  Patient needs a walker to treat with the following condition  Answer:  Total knee replacement status   11/16/18 1138   11/16/18 1139  DME 3 n 1  Once     11/16/18 1138   11/16/18 1139  DME Bedside commode  Once    Question:  Patient needs a bedside commode to treat with the following condition  Answer:  Total knee replacement status   11/16/18 1138          Diagnostic Studies: Dg Chest 2 View  Result Date: 11/11/2018 CLINICAL DATA:  Preop for left total knee replacement. EXAM: CHEST - 2 VIEW COMPARISON:  October 24, 2016. FINDINGS: Stable cardiomediastinal silhouette. Both lungs are clear. The visualized skeletal structures are unremarkable. IMPRESSION: No active cardiopulmonary disease. Electronically Signed   By: Marijo Conception M.D.   On: 11/11/2018 16:17   Dg Knee Left Port  Result Date: 11/16/2018 CLINICAL DATA:  Status post left knee replacement EXAM: PORTABLE LEFT KNEE - 1-2 VIEW COMPARISON:  /29/20. FINDINGS: Left knee prosthesis is noted in satisfactory position. No acute bony or soft tissue abnormality is noted. IMPRESSION: Status post left knee replacement Electronically Signed   By: Inez Catalina M.D.   On: 11/16/2018 10:23   Xr Knee 3 View Left  Result Date: 10/27/2018 Advanced tricompartmental degenerative joint disease with significant joint space narrowing   Disposition:     Follow-up Information    Leandrew Koyanagi, MD. Go on 12/01/2018.   Specialty: Orthopedic Surgery Why: At 8:45 am for a 2 week follow up with  Dr. Sherilyn Cooter information: [place] [id number] [phone]        [facility] Follow up.   Specialty: Home Health Services Why: You have been authorized for 5 home health physical therapy visits after discharge from the hospital. Someone from the agency will be in contact once you are discharged home to arrange your first appointment. Contact information: [address] [place] [address] [place]. Go  on 12/01/2018.   Why: Your appointment with Outpatient Therapy is scheduled for 11:45 am. Address: [address] [place], [place] [address] [phone]           Signed: Aundra Dubin 11/18/2018, 8:10 AM

## 2018-11-18 NOTE — Progress Notes (Signed)
Orthopedic Tech Progress Note Patient Details:  Ariel Harris 07-01-1945 [id number]  CPM Left Knee CPM Left Knee: On Left Knee Flexion (Degrees): 0 Left Knee Extension (Degrees): 90 Additional Comments: bone foam  Post Interventions Patient Tolerated: Well Instructions Provided: Care of device, Adjustment of device  Ariel Harris 11/18/2018, 6:17 AM

## 2018-11-18 NOTE — Progress Notes (Signed)
Physical Therapy Treatment Patient Details Name: Ariel Harris MRN: [id number] DOB: 1946/01/02 Today's Date: 11/18/2018    History of Present Illness Pt is a 73 y/o female s/p L TKA. PMH includes HTN and R TKA.    PT Comments    Pt making steady progress with functional mobility. Pt reported that she feels good about stair training which was practiced during a previous session. Plan is for pt to d/c home today with family. Pt would continue to benefit from skilled physical therapy services at this time while admitted and after d/c to address the below listed limitations in order to improve overall safety and independence with functional mobility.   Follow Up Recommendations  Home health PT     Equipment Recommendations  3in1 (PT)    Recommendations for Other Services       Precautions / Restrictions Precautions Precautions: Knee Restrictions Weight Bearing Restrictions: Yes LLE Weight Bearing: Weight bearing as tolerated    Mobility  Bed Mobility               General bed mobility comments: pt OOB in recliner chair upon arrival  Transfers Overall transfer level: Needs assistance Equipment used: Rolling walker (2 wheeled) Transfers: Sit to/from Stand Sit to Stand: Supervision         General transfer comment: Supervision for safety  Ambulation/Gait Ambulation/Gait assistance: Min guard;Supervision Gait Distance (Feet): 250 Feet Assistive device: Rolling walker (2 wheeled) Gait Pattern/deviations: Step-through pattern;Decreased stride length Gait velocity: Decreased   General Gait Details: pt able to self correct posture throughout; pt very steady overall with RW and emerging step-through gait pattern   Stairs             Wheelchair Mobility    Modified Rankin (Stroke Patients Only)       Balance Overall balance assessment: Needs assistance Sitting-balance support: No upper extremity supported;Feet supported Sitting balance-Leahy Scale:  Good     Standing balance support: During functional activity Standing balance-Leahy Scale: Fair                              Cognition Arousal/Alertness: Awake/alert Behavior During Therapy: WFL for tasks assessed/performed Overall Cognitive Status: Within Functional Limits for tasks assessed                                        Exercises Total Joint Exercises Heel Slides: AAROM;Left;10 reps;Seated Long Arc Quad: AROM;Strengthening;Both;15 reps;Seated Knee Flexion: AROM;Strengthening;Left;10 reps;Seated Goniometric ROM: Extension = lacking ~5 degrees to neutral; Flexion = ~90 degrees    General Comments        Pertinent Vitals/Pain Pain Assessment: Faces Faces Pain Scale: Hurts a little bit Pain Location: L knee  Pain Descriptors / Indicators: Aching;Operative site guarding Pain Intervention(s): Monitored during session;Repositioned    Home Living                      Prior Function            PT Goals (current goals can now be found in the care plan section) Acute Rehab PT Goals PT Goal Formulation: With patient Time For Goal Achievement: 11/30/18 Potential to Achieve Goals: Good Progress towards PT goals: Progressing toward goals    Frequency    7X/week      PT Plan Current plan remains appropriate  Co-evaluation              AM-PAC PT "6 Clicks" Mobility   Outcome Measure  Help needed turning from your back to your side while in a flat bed without using bedrails?: None Help needed moving from lying on your back to sitting on the side of a flat bed without using bedrails?: None Help needed moving to and from a bed to a chair (including a wheelchair)?: None Help needed standing up from a chair using your arms (e.g., wheelchair or bedside chair)?: None Help needed to walk in hospital room?: None Help needed climbing 3-5 steps with a railing? : A Little 6 Click Score: 23    End of Session Equipment  Utilized During Treatment: Gait belt Activity Tolerance: Patient tolerated treatment well Patient left: in chair;with call bell/phone within reach Nurse Communication: Mobility status PT Visit Diagnosis: Other abnormalities of gait and mobility (R26.89);Pain Pain - Right/Left: Left Pain - part of body: Knee     Time: [id number] PT Time Calculation (min) (ACUTE ONLY): 18 min  Charges:  $Gait Training: 8-22 mins                     Sherie Don, [place], DPT  Acute Rehabilitation Services Pager [phone] Office [place] 11/18/2018, 11:04 AM

## 2018-11-18 NOTE — Progress Notes (Signed)
Patient was given discharge instructions using interpreter machine.

## 2018-11-18 NOTE — Progress Notes (Signed)
Upon presenting discharge to patient with assistance of interpreter machine, patient stated she was missing money out of her purse.  Sharyn Lull, Agricultural consultant notified and Security called.  Report filed.

## 2018-11-18 NOTE — TOC Progression Note (Signed)
Transition of Care [facility]) - Progression Note    Patient Details  Name: Ariel Harris MRN: [id number] Date of Birth: 02-06-1945  [facility] CM/SW Contact  Jahlia Omura, Edson Snowball, RN Phone Number: 11/18/2018, 11:51 AM  Clinical Narrative:     Patient requesting walker for home.   Called Zack with [facility] for walker and 3 in 1.   Expected Discharge Plan: [place] Barriers to Discharge: Continued Medical Work up  Expected Discharge Plan and Services Expected Discharge Plan: [place]   Discharge Planning Services: CM Consult Post Acute Care Choice: [place] arrangements for the past 2 months: Single Family Home Expected Discharge Date: 11/18/18               DME Arranged: N/A DME Agency: [facility]       HH Arranged: PT [place] Agency: NA         Social Determinants of Health (SDOH) Interventions    Readmission Risk Interventions No flowsheet data found.

## 2018-11-18 NOTE — Progress Notes (Signed)
Subjective: 2 Days Post-Op Procedure(s) (LRB): LEFT TOTAL KNEE ARTHROPLASTY (Left) Patient reports pain as mild.    Objective: Vital signs in last 24 hours: Temp:  [97.4 F (36.3 C)-98.3 F (36.8 C)] 97.4 F (36.3 C) (10/21 0407) Pulse Rate:  [51-66] 59 (10/21 0407) Resp:  [14-18] (P) 18 (10/21 0407) BP: (91-167)/(59-88) 134/88 (10/21 0407) SpO2:  [97 %-99 %] 98 % (10/21 0407)  Intake/Output from previous day: 10/20 0701 - 10/21 0700 In: 840 [P.O.:840] Out: -  Intake/Output this shift: No intake/output data recorded.  Recent Labs    11/17/18 0413  HGB 12.7   Recent Labs    11/17/18 0413  WBC 7.1  RBC 4.37  HCT 40.1  PLT 165   Recent Labs    11/17/18 0413  NA 138  K 4.6  CL 108  CO2 24  BUN 16  CREATININE 0.83  GLUCOSE 127*  CALCIUM 8.2*   No results for input(s): LABPT, INR in the last 72 hours.  Neurologically intact Neurovascular intact Sensation intact distally Intact pulses distally Dorsiflexion/Plantar flexion intact Incision: dressing C/D/I No cellulitis present Compartment soft   Assessment/Plan: 2 Days Post-Op Procedure(s) (LRB): LEFT TOTAL KNEE ARTHROPLASTY (Left) Advance diet Up with therapy Discharge home with home health after first or second PT session (up to patient and how well she progresses) WBAT LLE Dressing changed by me today Please apply thigh high ted hose to BLE   Anticipated LOS equal to or greater than 2 midnights due to - Age 24 and older with one or more of the following:  - Obesity  - Expected need for hospital services (PT, OT, Nursing) required for safe  discharge  - Anticipated need for postoperative skilled nursing care or inpatient rehab  - Active co-morbidities: None OR   - Unanticipated findings during/Post Surgery: Slow post-op progression: GI, pain control, mobility  - Patient is a high risk of re-admission due to: None    Ariel Harris 11/18/2018, 8:08 AM

## 2018-11-19 ENCOUNTER — Telehealth: Payer: Self-pay | Admitting: *Deleted

## 2018-11-19 NOTE — Care Plan (Signed)
RNCM call to patient to check status after discharge from hospital on 11/18/18. RNCM used language line and was assisted by a [place] interpreter. The patient states she is doing well and is denying that she needs home health therapy. Informed that MD would like for her to have this to assist with range of motion and that if she would like to try a few visits first to see how she is doing before declining, then this would be agreeable. Patient states she is ok with home health PT coming to her home as first ordered and arranged. RNCM contacted liaison for [facility] to make aware. Will continue to follow for any CM needs.

## 2018-11-19 NOTE — Telephone Encounter (Signed)
D/C Ortho bundle call completed. 

## 2018-11-21 DIAGNOSIS — I10 Essential (primary) hypertension: Secondary | ICD-10-CM | POA: Diagnosis not present

## 2018-11-21 DIAGNOSIS — I491 Atrial premature depolarization: Secondary | ICD-10-CM | POA: Diagnosis not present

## 2018-11-21 DIAGNOSIS — Z471 Aftercare following joint replacement surgery: Secondary | ICD-10-CM | POA: Diagnosis not present

## 2018-11-21 DIAGNOSIS — Z96653 Presence of artificial knee joint, bilateral: Secondary | ICD-10-CM | POA: Diagnosis not present

## 2018-11-21 DIAGNOSIS — E785 Hyperlipidemia, unspecified: Secondary | ICD-10-CM | POA: Diagnosis not present

## 2018-11-21 DIAGNOSIS — M810 Age-related osteoporosis without current pathological fracture: Secondary | ICD-10-CM | POA: Diagnosis not present

## 2018-11-25 ENCOUNTER — Telehealth: Payer: Self-pay | Admitting: *Deleted

## 2018-11-25 NOTE — Telephone Encounter (Signed)
Ortho bundle 1 week call completed. 

## 2018-11-25 NOTE — Care Plan (Signed)
RNCM call to patient for 1 week post-op call. [place] interpreter [phone] used for call. Patient verbalized she is doing very well. Therapy has come out already 2 x this week and will be coming on Friday. Pain is moderate she states. Using compression hose and feels she is "more than 50% improved". Reminded of upcoming appointment next week with Dr. Erlinda Hong and reminded of change of office location to just next door. Patient verbalized understanding.

## 2018-12-01 ENCOUNTER — Encounter: Payer: Self-pay | Admitting: Physical Therapy

## 2018-12-01 ENCOUNTER — Other Ambulatory Visit: Payer: Self-pay

## 2018-12-01 ENCOUNTER — Telehealth: Payer: Self-pay | Admitting: *Deleted

## 2018-12-01 ENCOUNTER — Ambulatory Visit: Payer: Medicare Other | Attending: Physician Assistant | Admitting: Physical Therapy

## 2018-12-01 ENCOUNTER — Ambulatory Visit (INDEPENDENT_AMBULATORY_CARE_PROVIDER_SITE_OTHER): Payer: Medicare Other | Admitting: Physician Assistant

## 2018-12-01 ENCOUNTER — Encounter: Payer: Self-pay | Admitting: Orthopaedic Surgery

## 2018-12-01 DIAGNOSIS — R2689 Other abnormalities of gait and mobility: Secondary | ICD-10-CM | POA: Insufficient documentation

## 2018-12-01 DIAGNOSIS — M25662 Stiffness of left knee, not elsewhere classified: Secondary | ICD-10-CM | POA: Diagnosis not present

## 2018-12-01 DIAGNOSIS — R600 Localized edema: Secondary | ICD-10-CM | POA: Insufficient documentation

## 2018-12-01 DIAGNOSIS — Z96652 Presence of left artificial knee joint: Secondary | ICD-10-CM

## 2018-12-01 DIAGNOSIS — M25562 Pain in left knee: Secondary | ICD-10-CM | POA: Diagnosis not present

## 2018-12-01 NOTE — Therapy (Signed)
[place], [place], [address] Phone: [phone]   Fax:  [phone]  Physical Therapy Evaluation  Patient Details  Name: Ariel Harris MRN: [id number] Date of Birth: 03-Sep-1945 Referring Provider (PT): Tiney Rouge [facility]    Encounter Date: 12/01/2018  PT End of Session - 12/01/18 1359    Visit Number  1    Number of Visits  16    Date for PT Re-Evaluation  01/26/19    Authorization Type  Meicare / Medicaid    PT Start Time  [id number]    PT Stop Time  1230    PT Time Calculation (min)  32 min    Activity Tolerance  Patient tolerated treatment well    Behavior During Therapy  [facility] for tasks assessed/performed       Past Medical History:  Diagnosis Date  . Arthritis    "knees" (05/16/2017)  . Chest pain   . Hyperlipidemia   . Hypertension   . PAC (premature atrial contraction)   . SVT (supraventricular tachycardia) (HCC)     Past Surgical History:  Procedure Laterality Date  . JOINT REPLACEMENT    . KNEE ARTHROSCOPY Left 1996  . KNEE SURGERY Right [phone]   "open surgery; because of the pain"  . 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 TOTAL KNEE ARTHROPLASTY Right 05/16/2017   Procedure: RIGHT TOTAL KNEE ARTHROPLASTY;  Surgeon: Leandrew Koyanagi, MD;  Location: [facility];  Service: Orthopedics;  Laterality: Right;  . TOTAL KNEE ARTHROPLASTY Left 11/16/2018   Procedure: LEFT TOTAL KNEE ARTHROPLASTY;  Surgeon: Leandrew Koyanagi, MD;  Location: [facility];  Service: Orthopedics;  Laterality: Left;    There were no vitals filed for this visit.   Subjective Assessment - 12/01/18 1203    Subjective  Patient is S/P left TKA on 11/16/2018. She is using a walker today for mobility. At home she is not using the walker. She is leaning on furniture. She had home health 3x a week. She had her replaced 2 years ago.    Diagnostic tests  Nothing post-op    Currently in Pain?  Yes    Pain Score  6     Pain Location   Knee    Pain Orientation  Left    Pain Descriptors / Indicators  Aching    Pain Type  Surgical pain    Pain Onset  More than a month ago    Pain Frequency  Constant    Pain Relieving Factors  Moevement         [facility] PT Assessment - 12/01/18 0001      Assessment   Medical Diagnosis  Left TKA     Referring Provider (PT)  Tiney Rouge [facility]     Onset Date/Surgical Date  11/16/18    Hand Dominance  Right    Next MD Visit  12/30/2018     Prior Therapy  None       Precautions   Precautions  Fall    Precaution Comments  Patient on a walker       Restrictions   Weight Bearing Restrictions  No      Balance Screen   Has the patient fallen in the past 6 months  No    Has the patient had a decrease in activity level because of a fear of falling?   No    Is the patient reluctant to leave their home because of a fear of falling?  No      Home Environment   Living Environment  Private residence    Type of [place] to enter    [employer] of Steps  4      Prior Function   Level of Independence  Independent    Vocation  Retired    Leisure  Spaned times with grandchildren       Cognition   Overall Cognitive Status  Within Functional Limits for tasks assessed    Attention  Focused    Focused Attention  Appears intact    Memory  Appears intact    Awareness  Appears intact    Problem Solving  Appears intact      Observation/Other Assessments   Skin Integrity  well healing surgical wound       Observation/Other Assessments-Edema    Edema  Circumferential      Circumferential Edema   Circumferential - Right  39    Circumferential - Left   45.5      Sensation   Light Touch  Appears Intact    Additional Comments  Nothing in her knee but in her right hand. She was advised to talk to Dr Sheryle Hail.       Coordination   Gross Motor Movements are Fluid and Coordinated  Yes    Fine Motor Movements are Fluid and Coordinated  Yes       Posture/Postural Control   Posture Comments  good posture       ROM / Strength   AROM / PROM / Strength  AROM;PROM;Strength      AROM   AROM Assessment Site  Knee    Right/Left Knee  Left    Left Knee Extension  14    Left Knee Flexion  88      PROM   PROM Assessment Site  Knee    Right/Left Knee  Right;Left    Left Knee Flexion  94      Strength   Strength Assessment Site  Knee;Hip    Right/Left Hip  Left    Left Hip Flexion  3/5    Left Hip ABduction  4/5    Left Hip ADduction  4+/5    Right/Left Knee  Left    Left Knee Flexion  3+/5    Left Knee Extension  3+/5      Palpation   Patella mobility  limited inferior/superior movement of the patella     Palpation comment  No unexpected tenerness to palaption       Transfers   Comments  slow trasfer from sit to stand from standard chair       Ambulation/Gait   Assistive device  Rolling walker    Gait Comments  decreased single leg stance on the left; decreased left hip flexion                 Objective measurements completed on examination: See above findings.      [facility] PT Treatment/Exercise - 12/01/18 0001      Knee/Hip Exercises: Stretches   Other Knee/Hip Stretches  reviewed self knee extension stretch and sefl knee flexion stretch with strap; Mod cuing  for technique and duration.              PT Education - 12/01/18 1714    Education Details  reviewed HEP and symptom mangement    Person(s) Educated  Patient    Methods  Explanation;Demonstration;Tactile cues;Verbal  cues    Comprehension  Verbalized understanding;Returned demonstration;Verbal cues required;Tactile cues required       PT Short Term Goals - 12/01/18 1406      PT SHORT TERM GOAL #1   Title  Patient will increase left knee extension passive motion by 5 degrees    Time  4    Period  Weeks    Status  New    Target Date  12/29/18      PT SHORT TERM GOAL #2   Title  Patient will increase passive knee flexion by 10  degrees    Time  3    Period  Weeks    Status  New    Target Date  12/22/18      PT SHORT TERM GOAL #3   Title  Patient will demonstrate decreased mid patella edema by 2.5 cm    Time  3    Period  Weeks    Status  New    Target Date  12/22/18      PT SHORT TERM GOAL #4   Title  Patient will increase knee extension strength and left hip flexion strength by 1 grade    Time  3    Period  Weeks    Status  New    Target Date  12/22/18        PT Long Term Goals - 12/01/18 1409      PT LONG TERM GOAL #1   Title  Patient will go up/down 4 steps without any pain with reciprocal pattern    Time  8    Period  Weeks    Status  New    Target Date  01/26/19      PT LONG TERM GOAL #2   Title  Patient will bend to pick object up without pain    Time  8    Period  Weeks    Status  New    Target Date  01/26/19      PT LONG TERM GOAL #3   Title  Patient will ambualte 3000' in the community without a device or increased pain.    Time  8    Period  Weeks    Status  New    Target Date  01/26/19             Plan - 12/01/18 1400    Clinical Impression Statement  Patient is a 73 year old female S/P left TKA on 11/16/2018. She presents with expected limitations in motion, strength, and fucntional mobility. The patient is currently using a walker. She would benefit from skilled therapy to improve the patients ability to abulate and go up and down stairs.    Personal Factors and Comorbidities  Comorbidity 1    Comorbidities  right TKA    Examination-Activity Limitations  Squat;Lift;Stand;Transfers;Sit;Carry;Locomotion Level    Examination-Participation Restrictions  Meal Prep;Yard Work;Cleaning;Shop;Laundry    Stability/Clinical Decision Making  Stable/Uncomplicated    Clinical Decision Making  Low    Rehab Potential  Good    PT Frequency  2x / week    PT Duration  6 weeks    PT Treatment/Interventions  ADLs/Self Care Home Management;Cryotherapy;Electrical  Stimulation;Iontophoresis 4mg /ml Dexamethasone;Ultrasound;Gait training;Stair training;Functional mobility training;Therapeutic activities;Therapeutic exercise;Neuromuscular re-education;Patient/family education;Manual techniques;Passive range of motion;Taping;DME Instruction    PT Next Visit Plan  progress to a cane if needed; work on SLR, SAQ, hip abduction; quad sets; Manual therapy for flexion and extension; patella mobility; modlaities PRN  PT Home Exercise Plan  patient was late so only had time to review flexion and extension    Consulted and Agree with Plan of Care  Patient       Patient will benefit from skilled therapeutic intervention in order to improve the following deficits and impairments:  Abnormal gait, Difficulty walking, Decreased endurance, Decreased range of motion, Decreased activity tolerance, Decreased safety awareness, Pain, Increased fascial restricitons, Improper body mechanics, Impaired perceived functional ability  Visit Diagnosis: Stiffness of left knee, not elsewhere classified  Acute pain of left knee  Other abnormalities of gait and mobility  Localized edema     Problem List Patient Active Problem List   Diagnosis Date Noted  . S/P total knee arthroplasty 11/17/2018  . 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 SVT (supraventricular tachycardia) ([place]) 07/09/2018  . Unilateral primary osteoarthritis, left knee 10/28/2017  . Hyperlipidemia 05/25/2017  . Osteoporosis 05/25/2017  . Status post total right knee replacement 05/16/2017  . Unilateral primary osteoarthritis, right knee 11/01/2016  . History of PSVT 01/13/2014  . Chest pain 03/28/2011  . Palpitations 03/12/2011  . Dyslipidemia 01/12/2010  . Hypertension 01/12/2010  . CHEST PAIN, ATYPICAL 01/12/2010    Carney Living  PT DPT  12/01/2018, 5:22 PM  [facility] [address] [place], [place], [address] Phone: [phone]    Fax:  [phone]  Name: Ariel Harris MRN: [id number] Date of Birth: 08/28/1945

## 2018-12-01 NOTE — Progress Notes (Signed)
   Post-Op Visit Note   Patient: Ariel Harris           Date of Birth: [birth year]           MRN: [id number] Visit Date: 12/01/2018 PCP: Antonietta Jewel, MD   Assessment & Plan:  Chief Complaint: No chief complaint on file.  Visit Diagnoses:  1. S/P TKR (total knee replacement), left     Plan: Patient is a pleasant 73 year old female who presents our clinic today 2 weeks status post left total knee replacement, date of surgery 11/16/2018.  She has been doing well.  She is ambulating with a walker.  She has been working well with home health physical therapy.  Minimal pain.  Examination of the left knee reveals a well-healing surgical incision without evidence of infection or cellulitis.  She does have moderate swelling.  Her calf is soft nontender.  She is neurovascularly intact distally.  At this point, Steri-Strips were reapplied.  We will start her in outpatient physical therapy and this is already been set up for her to start this afternoon.  Dental prophylaxis reinforced.  Follow-up with [place] in 4 weeks time for repeat evaluation 2 view x-rays of the left knee  Follow-Up Instructions: Return in about 4 weeks (around 12/29/2018).   Orders:  No orders of the defined types were placed in this encounter.  No orders of the defined types were placed in this encounter.   Imaging: No new imaging  PMFS History: Patient Active Problem List   Diagnosis Date Noted  . S/P total knee arthroplasty 11/17/2018  . 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 SVT (supraventricular tachycardia) ([place]) 07/09/2018  . Unilateral primary osteoarthritis, left knee 10/28/2017  . Hyperlipidemia 05/25/2017  . Osteoporosis 05/25/2017  . Status post total right knee replacement 05/16/2017  . Unilateral primary osteoarthritis, right knee 11/01/2016  . History of PSVT 01/13/2014  . Chest pain 03/28/2011  . Palpitations 03/12/2011  . Dyslipidemia 01/12/2010  . Hypertension 01/12/2010  . CHEST  PAIN, ATYPICAL 01/12/2010   Past Medical History:  Diagnosis Date  . Arthritis    "knees" (05/16/2017)  . Chest pain   . Hyperlipidemia   . Hypertension   . PAC (premature atrial contraction)   . SVT (supraventricular tachycardia) (HCC)     Family History  Problem Relation Age of Onset  . Coronary artery disease Neg Hx   . Heart attack Neg Hx   . Breast cancer Neg Hx     Past Surgical History:  Procedure Laterality Date  . JOINT REPLACEMENT    . KNEE ARTHROSCOPY Left 1996  . KNEE SURGERY Right [phone]   "open surgery; because of the pain"  . 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 TOTAL KNEE ARTHROPLASTY Right 05/16/2017   Procedure: RIGHT TOTAL KNEE ARTHROPLASTY;  Surgeon: Leandrew Koyanagi, MD;  Location: [facility];  Service: Orthopedics;  Laterality: Right;  . TOTAL KNEE ARTHROPLASTY Left 11/16/2018   Procedure: LEFT TOTAL KNEE ARTHROPLASTY;  Surgeon: Leandrew Koyanagi, MD;  Location: [facility];  Service: Orthopedics;  Laterality: Left;   Social History   Occupational History  . Not on file  Tobacco Use  . Smoking status: Never Smoker  . Smokeless tobacco: Never Used  Substance and Sexual Activity  . Alcohol use: No  . Drug use: No  . Sexual activity: Not Currently

## 2018-12-01 NOTE — Telephone Encounter (Signed)
Ortho bundle 14 day in office visit completed. 

## 2018-12-01 NOTE — Care Plan (Signed)
RNCM met with patient while she was in office today for her 2 week post-op visit. Met with patient during her exam with Dr. Phoebe Sharps PA. Patient verbalized she is doing well. Does have some moderate swelling in the left knee. Steri-strips applied today and discussed use of compression hose to help with swelling. She shows that she has one stocking on her right leg, but does not on her surgical leg. Instructed to wear compression hose during the day and may remove at night to help with swelling. Order for a new pair given by PA for patient to purchase at medical supply store. Discussed OPPT, which is scheduled to begin today with [facility] on [address]. Now that [facility] OP therapy location is open, patient wished to switch to this location for her therapy. Instructed to go to her evaluation today for therapy and if they can switch the rest of her visits to this location, this may be done. RNCM found out a little while later that because [facility] therapy location does not take medicaid and this is patient's secondary payor, she cannot attend therapy at Dr. Phoebe Sharps office location. F/U in 4 weeks.

## 2018-12-02 ENCOUNTER — Ambulatory Visit (INDEPENDENT_AMBULATORY_CARE_PROVIDER_SITE_OTHER): Payer: Medicare Other | Admitting: Emergency Medicine

## 2018-12-02 ENCOUNTER — Encounter: Payer: Self-pay | Admitting: Emergency Medicine

## 2018-12-02 VITALS — BP 123/48 | HR 75 | Temp 98.6°F | Ht 63.0 in | Wt 155.0 lb

## 2018-12-02 DIAGNOSIS — M25511 Pain in right shoulder: Secondary | ICD-10-CM

## 2018-12-02 DIAGNOSIS — M5412 Radiculopathy, cervical region: Secondary | ICD-10-CM | POA: Diagnosis not present

## 2018-12-02 MED ORDER — CYCLOBENZAPRINE HCL 10 MG PO TABS: 10.0000 mg | ORAL_TABLET | Freq: Every day | ORAL | 0 refills | Status: AC

## 2018-12-02 MED ORDER — PREDNISONE 20 MG PO TABS: 20.0000 mg | ORAL_TABLET | Freq: Every day | ORAL | 0 refills | Status: AC

## 2018-12-02 NOTE — Progress Notes (Signed)
Ariel Harris 73 y.o.   Chief Complaint  Patient presents with  . Shoulder Pain    Pt stated--Right shoulder having aching and numbness on the right fingers,especially when lifting the arm up is worse--2 weeks    HISTORY OF PRESENT ILLNESS: [name] complaining of pain to her right shoulder for 2 weeks with intermittent tingling and numbness at the tip of fingers on the right hand.  Denies injury.  No other significant symptoms. [employer] interpreter Ariel Harris used. No other complaints or medical concerns today.  HPI   Prior to Admission medications   Medication Sig Start Date End Date Taking? Authorizing Provider  aspirin EC 81 MG tablet Take 1 tablet (81 mg total) by mouth 2 (two) times daily. 11/16/18   Ariel Koyanagi, MD  calcium-vitamin D (OSCAL WITH D) 500-200 MG-UNIT tablet Take 1 tablet by mouth daily with breakfast.    [provider]  methocarbamol (ROBAXIN) 750 MG tablet Take 1 tablet (750 mg total) by mouth 2 (two) times daily as needed for muscle spasms. 11/16/18   Ariel Koyanagi, MD  metoprolol succinate (TOPROL XL) 25 MG 24 hr tablet Take 1 tablet by mouth daily, may take 1 extra tablet by mouth daily only as needed for palpitations Patient taking differently: Take 25 mg by mouth daily. may take 1 extra tablet by mouth daily only as needed for palpitations 07/09/18   Ariel Hancock, MD  ondansetron (ZOFRAN) 4 MG tablet Take 1-2 tablets (4-8 mg total) by mouth every 8 (eight) hours as needed for nausea or vomiting. Patient not taking: Reported on 12/01/2018 11/16/18   Ariel Koyanagi, MD  oxyCODONE (OXY IR/ROXICODONE) 5 MG immediate release tablet Take 1-2 tablets (5-10 mg total) by mouth every 8 (eight) hours as needed for severe pain. 11/16/18   Ariel Koyanagi, MD  pravastatin (PRAVACHOL) 40 MG tablet Take 1 tablet (40 mg total) by mouth daily. Patient taking differently: Take 40 mg by mouth at bedtime.  07/09/18   Ariel Harris, Ariel Kras, MD  senna-docusate (SENOKOT S)  8.6-50 MG tablet Take 1-2 tablets by mouth at bedtime as needed. 11/16/18   Ariel Koyanagi, MD    Allergies  Allergen Reactions  . Indomethacin Swelling  . Tramadol     Sweaty, made feel bad    Patient Active Problem List   Diagnosis Date Noted  . 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 SVT (supraventricular tachycardia) ([place]) 07/09/2018  . Osteoporosis 05/25/2017  . Status post total right knee replacement 05/16/2017  . Dyslipidemia 01/12/2010  . Hypertension 01/12/2010    Past Medical History:  Diagnosis Date  . Arthritis    "knees" (05/16/2017)  . Chest pain   . Hyperlipidemia   . Hypertension   . PAC (premature atrial contraction)   . SVT (supraventricular tachycardia) ([facility])     Past Surgical History:  Procedure Laterality Date  . JOINT REPLACEMENT    . KNEE ARTHROSCOPY Left 1996  . KNEE SURGERY Right [phone]   "open surgery; because of the pain"  . 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 TOTAL KNEE ARTHROPLASTY Right 05/16/2017   Procedure: RIGHT TOTAL KNEE ARTHROPLASTY;  Surgeon: Ariel Koyanagi, MD;  Location: [facility];  Service: Orthopedics;  Laterality: Right;  . TOTAL KNEE ARTHROPLASTY Left 11/16/2018   Procedure: LEFT TOTAL KNEE ARTHROPLASTY;  Surgeon: Ariel Koyanagi, MD;  Location: [facility];  Service: Orthopedics;  Laterality: Left;    Social  History   Socioeconomic History  . Marital status: Divorced    Spouse name: Not on file  . Number of children: 2  . Years of education: Not on file  . Highest education level: Not on file  Occupational History  . Not on file  Social Needs  . Financial resource strain: Not on file  . Food insecurity    Worry: Not on file    Inability: Not on file  . Transportation needs    Medical: Not on file    Non-medical: Not on file  Tobacco Use  . Smoking status: Never Smoker  . Smokeless tobacco: Never Used  Substance and Sexual Activity  . Alcohol use: No  . Drug use: No  . Sexual activity: Not  Currently  Lifestyle  . Physical activity    Days per week: Not on file    Minutes per session: Not on file  . Stress: Not on file  Relationships  . Social Herbalist on phone: Not on file    Gets together: Not on file    Attends religious service: Not on file    Active member of club or organization: Not on file    Attends meetings of clubs or organizations: Not on file    Relationship status: Not on file  . Intimate partner violence    Fear of current or ex partner: Not on file    Emotionally abused: Not on file    Physically abused: Not on file    Forced sexual activity: Not on file  Other Topics Concern  . Not on file  Social History Narrative   Negative family `history--Specifically artery disease or MI. The patient has two siblings died at a young age from fevers.   The patient is separated and single. She is retired but used to work at a [employer] and a [employer]. She is independent of daily living. She has never drink or smoke or done drugs    Family History  Problem Relation Age of Onset  . Coronary artery disease Neg Hx   . Heart attack Neg Hx   . Breast cancer Neg Hx      Review of Systems  Constitutional: Negative.  Negative for chills and fever.  HENT: Negative.  Negative for congestion and sore throat.   Respiratory: Negative.  Negative for cough and shortness of breath.   Cardiovascular: Negative.  Negative for chest pain and palpitations.  Gastrointestinal: Negative.  Negative for abdominal pain, blood in stool, diarrhea, nausea and vomiting.  Genitourinary: Negative.  Negative for dysuria and hematuria.  Musculoskeletal: Positive for joint pain. Negative for myalgias.  Skin: Negative.  Negative for rash.  Neurological: Negative.  Negative for dizziness, sensory change, focal weakness and headaches.  All other systems reviewed and are negative.     Vitals:   12/02/18 0922  BP: (!) 123/48  Pulse: 75  Temp: 98.6 F (37 C)  SpO2: 96%     Physical Exam Vitals signs reviewed.  Constitutional:      Appearance: Normal appearance.  HENT:     Head: Normocephalic.  Eyes:     Extraocular Movements: Extraocular movements intact.     Pupils: Pupils are equal, round, and reactive to light.  Neck:     Musculoskeletal: Normal range of motion. No muscular tenderness.  Cardiovascular:     Rate and Rhythm: Normal rate and regular rhythm.     Heart sounds: Normal heart sounds.  Pulmonary:  Effort: Pulmonary effort is normal.     Breath sounds: Normal breath sounds.  Musculoskeletal:        General: No swelling.     Comments: Right shoulder: Mild tenderness, full range of motion but complaining of pain.  No erythema or bruising. Right elbow and wrist: Within normal limits. Right hand: Within normal limits. Neurovascularly intact.  Lymphadenopathy:     Cervical: No cervical adenopathy.  Skin:    General: Skin is warm and dry.     Capillary Refill: Capillary refill takes less than 2 seconds.  Neurological:     General: No focal deficit present.     Mental Status: She is alert and oriented to person, place, and time.    A total of 25 minutes was spent in the room with the patient, greater than 50% of which was in counseling/coordination of care regarding differential diagnosis, treatment, medications and side effects, need for orthopedic evaluation, prognosis and need for follow-up.   ASSESSMENT & PLAN: Margot was seen today for shoulder pain.  Diagnoses and all orders for this visit:  Acute pain of right shoulder -     cyclobenzaprine (FLEXERIL) 10 MG tablet; Take 1 tablet (10 mg total) by mouth at bedtime for 10 days.  Cervical radiculopathy -     predniSONE (DELTASONE) 20 MG tablet; Take 1 tablet (20 mg total) by mouth daily with breakfast for 5 days.    Patient Instructions       If you have lab work done today you will be contacted with your lab results within the next 2 weeks.  If you have not heard from [place]  then please contact us. The fastest way to get your results is to register for My Chart.   IF you received an x-ray today, you will receive an invoice from [facility]. Please contact [facility] at [phone] with questions or concerns regarding your invoice.   IF you received labwork today, you will receive an invoice from [facility]. Please contact [facility] at [phone] with questions or concerns regarding your invoice.   Our billing staff will not be able to assist you with questions regarding bills from these companies.  You will be contacted with the lab results as soon as they are available. The fastest way to get your results is to activate your My Chart account. Instructions are located on the last page of this paperwork. If you have not heard from [place] regarding the results in 2 weeks, please contact this office.      Cervical Radiculopathy  Cervical radiculopathy means that a nerve in the neck (a cervical nerve) is pinched or bruised. This can happen because of an injury to the cervical spine (vertebrae) in the neck, or as a normal part of getting older. This can cause pain or loss of feeling (numbness) that runs from your neck all the way down to your arm and fingers. Often, this condition gets better with rest. Treatment may be needed if the condition does not get better. What are the causes?  A neck injury.  A bulging disk in your spine.  Muscle movements that you cannot control (muscle spasms).  Tight muscles in your neck due to overuse.  Arthritis.  Breakdown in the bones and joints of the spine (spondylosis) due to getting older.  Bone spurs that form near the nerves in the neck. What are the signs or symptoms?  Pain. The pain may: ? Run from the neck to the arm and hand. ?  Be very bad or irritating. ? Be worse when you move your neck.  Loss of feeling or tingling in your arm or hand.  Weakness in your arm or hand, in very bad cases. How  is this treated? In many cases, treatment is not needed for this condition. With rest, the condition often gets better over time. If treatment is needed, options may include:  Wearing a soft neck collar (cervical collar) for short periods of time, as told by your doctor.  Doing exercises (physical therapy) to strengthen your neck muscles.  Taking medicines.  Having shots (injections) in your spine, in very bad cases.  Having surgery. This may be needed if other treatments do not help. The type of surgery that is used depends on the cause of your condition. Follow these instructions at home: If you have a soft neck collar:  Wear it as told by your doctor. Remove it only as told by your doctor.  Ask your doctor if you can remove the collar for cleaning and bathing. If you are allowed to remove the collar for cleaning or bathing: ? Follow instructions from your doctor about how to remove the collar safely. ? Clean the collar by wiping it with mild soap and water and drying it completely. ? Take out any removable pads in the collar every 1-2 days. Wash them by hand with soap and water. Let them air-dry completely before you put them back in the collar. ? Check your skin under the collar for redness or sores. If you see any, tell your doctor. Managing pain      Take over-the-counter and prescription medicines only as told by your doctor.  If told, put ice on the painful area. ? If you have a soft neck collar, remove it as told by your doctor. ? Put ice in a plastic bag. ? Place a towel between your skin and the bag. ? Leave the ice on for 20 minutes, 2-3 times a day.  If using ice does not help, you can try using heat. Use the heat source that your doctor recommends, such as a moist heat pack or a heating pad. ? Place a towel between your skin and the heat source. ? Leave the heat on for 20-30 minutes. ? Remove the heat if your skin turns bright red. This is very important if you are  unable to feel pain, heat, or cold. You may have a greater risk of getting burned.  You may try a gentle neck and shoulder rub (massage). Activity  Rest as needed.  Return to your normal activities as told by your doctor. Ask your doctor what activities are safe for you.  Do exercises as told by your doctor or physical therapist.  Do not lift anything that is heavier than 10 lb (4.5 kg) until your doctor tells you that it is safe. General instructions  Use a flat pillow when you sleep.  Do not drive while wearing a soft neck collar. If you do not have a soft neck collar, ask your doctor if it is safe to drive while your neck heals.  Ask your doctor if the medicine prescribed to you requires you to avoid driving or using heavy machinery.  Do not use any products that contain nicotine or tobacco, such as cigarettes, e-cigarettes, and chewing tobacco. These can delay healing. If you need help quitting, ask your doctor.  Keep all follow-up visits as told by your doctor. This is important. Contact a doctor if:  Your condition does not get better with treatment. Get help right away if:  Your pain gets worse and is not helped with medicine.  You lose feeling or feel weak in your hand, arm, face, or leg.  You have a high fever.  You have a stiff neck.  You cannot control when you poop or pee (have incontinence).  You have trouble with walking, balance, or talking. Summary  Cervical radiculopathy means that a nerve in the neck is pinched or bruised.  A nerve can get pinched from a bulging disk, arthritis, an injury to the neck, or other causes.  Symptoms include pain, tingling, or loss of feeling that goes from the neck into the arm or hand.  Weakness in your arm or hand can happen in very bad cases.  Treatment may include resting, wearing a soft neck collar, and doing exercises. You might need to take medicines for pain. In very bad cases, shots or surgery may be needed.  This information is not intended to replace advice given to you by your health care provider. Make sure you discuss any questions you have with your health care provider. Document Released: 01/03/2011 Document Revised: 12/05/2017 Document Reviewed: 12/05/2017 Elsevier Patient Education  [place].  ?au vai Shoulder Pain Nhi?u nguyn nhn c th? gy ?au vai, bao g?m:  Ch?n th??ng vai.  V?n ??ng vai qu m?c.  Vim kh?p. Ngu?n gy ?au c th? l:  Vim.  Ch?n th??ng kh?p vai.  Ch?n th??ng gn, dy ch?ng [place]?c x??ng. Tun th? nh?ng h??ng d?n ny ? nh: Ch  ??n cc thay ??i v? tri?u ch?ng c?a qu v?. Hy cho nguyn gia ch?m [place] s?c kh?e bi?t v? cc thay ??i ?. Lm theo cc h??ng d?n ny ?? gi?m ?au. N?u qu v? s? d?ng b?ng ?eo:  ?eo b?ng ?eo theo ch? d?n c?a chuyn gia ch?m [place] s?c kh?e. Ch? tho ra theo ch? d?n c?a chuyn gia ch?m [place] s?c kh?e.  N?i l?ng b?ng ?eo n?u cc ngn tay b? ?au bu?t, t [place]?c tr?? nn l?nh v xanh ti.  Gi? cho b?ng ?eo s?ch s?.  N?u b?ng ?eo khng ph?i lo?i ch?ng th?m n??c: ? Khng ?? n b? ??t. Tho n ra ?? t?m.  C? ??ng cnh tay cng t cng t?t, nh?ng lun c? ??ng bn tay c?a qu v? ?? trnh s?ng n?. X? tr ?au, c?ng kh?p v s?ng n?   N?u ???c ch? d?n, hy ch??m ? l?nh ln vng b? ?au: ? Cho ? l?nh vo ti ni lng. ? ?? kh?n t?m ? gi?a da v ti ch??m. ? Ch??m ? l?nh trong 20 pht, 2-3 l?n m?i ngy. D?ng ch??m ? l?nh n?u vi?c ? khng lm gi?m ?au.  Bp qu? bng m?m [place]?c t?m b?t bi?n cng nhi?u cng t?t. Vi?c ny gip gi? cho vai khng b? s?ng. N c?ng gip t?ng c??ng s?c m?nh c?a cnh tay. H??ng d?n chung  Ch? s? d?ng thu?c khng k ??n v thu?c k ??n theo ch? d?n c?a chuyn gia ch?m [place] s?c kh?e.  Tun th? t?t c? cc l?n khm theo di theo ch? d?n c?a chuyn gia ch?m [place] s?c kh?e. ?i?u ny c vai tr quan tr?ng. Hy lin l?c v?i chuyn gia ch?m [place] s?c kh?e n?u:  ?au tr? nn tr?m tr?ng h?n.  Qu v? khng h?t ?au sau khi  dng thu?c.  ?au m?i b? ? cnh tay, bn tay [place]?c cc ngn tay c?a qu v?.  Yu c?u tr? gip ngay l?p t?c n?u:  Cnh tay, bn tay [place]?c ngn tay c?a qu v?: ? ?au bu?t. ? B? t. ? B? s?ng. ? B? ?au. ? Chuy?n sang mu tr?ng [place]?c xanh. Tm t?t  ?au vai c th? do th??ng t?n, s? d?ng qu nhi?u, [place]?c vim kh?p gy ra.  Ch  ??n cc thay ??i v? tri?u ch?ng c?a qu v?. Hy cho nguyn gia ch?m [place] s?c kh?e bi?t v? cc thay ??i ?.  Tnh tr?ng ny c th? ???c ?i?u tr? b?ng b?ng ?eo, ? l?nh v thu?c gi?m ?au.  Lin h? v?i chuyn gia ch?m [place] s?c kh?e n?u c?n ?au tr?m tr?ng h?n [place]?c c c?n ?au m?i. Tm s? tr? gip ngay l?p t?c n?u cnh tay, bn tay [place]?c ngn tay c?a qu v? b? ?au bu?t [place]?c b? t, s?ng [place]?c ?au.  Tun th? t?t c? cc l?n khm theo di theo ch? d?n c?a chuyn gia ch?m [place] s?c kh?e. ?i?u ny c vai tr quan tr?ng. Thng tin ny khng nh?m m?c ?ch thay th? cho l?i khuyn m chuyn gia ch?m [place] s?c kh?e ni v?i qu v?. Hy b?o ??m qu v? ph?i th?o lu?n b?t k? v?n ?? g m qu v? c v?i chuyn gia ch?m [place] s?c kh?e c?a qu v?. Document Released: 07/16/2011 Document Revised: 09/15/2017 Document Reviewed: 09/15/2017 Elsevier Patient Education  2020 [employer].      Agustina Caroli, MD [facility]

## 2018-12-02 NOTE — Patient Instructions (Addendum)
   If you have lab work done today you will be contacted with your lab results within the next 2 weeks.  If you have not heard from us then please contact us. The fastest way to get your results is to register for My Chart.   IF you received an x-ray today, you will receive an invoice from [facility]. Please contact [facility] at [phone] with questions or concerns regarding your invoice.   IF you received labwork today, you will receive an invoice from [facility]. Please contact [facility] at [phone] with questions or concerns regarding your invoice.   Our billing staff will not be able to assist you with questions regarding bills from these companies.  You will be contacted with the lab results as soon as they are available. The fastest way to get your results is to activate your My Chart account. Instructions are located on the last page of this paperwork. If you have not heard from us regarding the results in 2 weeks, please contact this office.      Cervical Radiculopathy  Cervical radiculopathy means that a nerve in the neck (a cervical nerve) is pinched or bruised. This can happen because of an injury to the cervical spine (vertebrae) in the neck, or as a normal part of getting older. This can cause pain or loss of feeling (numbness) that runs from your neck all the way down to your arm and fingers. Often, this condition gets better with rest. Treatment may be needed if the condition does not get better. What are the causes?  A neck injury.  A bulging disk in your spine.  Muscle movements that you cannot control (muscle spasms).  Tight muscles in your neck due to overuse.  Arthritis.  Breakdown in the bones and joints of the spine (spondylosis) due to getting older.  Bone spurs that form near the nerves in the neck. What are the signs or symptoms?  Pain. The pain may: ? Run from the neck to the arm and hand. ? Be very bad or irritating. ? Be  worse when you move your neck.  Loss of feeling or tingling in your arm or hand.  Weakness in your arm or hand, in very bad cases. How is this treated? In many cases, treatment is not needed for this condition. With rest, the condition often gets better over time. If treatment is needed, options may include:  Wearing a soft neck collar (cervical collar) for short periods of time, as told by your doctor.  Doing exercises (physical therapy) to strengthen your neck muscles.  Taking medicines.  Having shots (injections) in your spine, in very bad cases.  Having surgery. This may be needed if other treatments do not help. The type of surgery that is used depends on the cause of your condition. Follow these instructions at home: If you have a soft neck collar:  Wear it as told by your doctor. Remove it only as told by your doctor.  Ask your doctor if you can remove the collar for cleaning and bathing. If you are allowed to remove the collar for cleaning or bathing: ? Follow instructions from your doctor about how to remove the collar safely. ? Clean the collar by wiping it with mild soap and water and drying it completely. ? Take out any removable pads in the collar every 1-2 days. Wash them by hand with soap and water. Let them air-dry completely before you put them back in the collar. ?   Check your skin under the collar for redness or sores. If you see any, tell your doctor. Managing pain      Take over-the-counter and prescription medicines only as told by your doctor.  If told, put ice on the painful area. ? If you have a soft neck collar, remove it as told by your doctor. ? Put ice in a plastic bag. ? Place a towel between your skin and the bag. ? Leave the ice on for 20 minutes, 2-3 times a day.  If using ice does not help, you can try using heat. Use the heat source that your doctor recommends, such as a moist heat pack or a heating pad. ? Place a towel between your skin and  the heat source. ? Leave the heat on for 20-30 minutes. ? Remove the heat if your skin turns bright red. This is very important if you are unable to feel pain, heat, or cold. You may have a greater risk of getting burned.  You may try a gentle neck and shoulder rub (massage). Activity  Rest as needed.  Return to your normal activities as told by your doctor. Ask your doctor what activities are safe for you.  Do exercises as told by your doctor or physical therapist.  Do not lift anything that is heavier than 10 lb (4.5 kg) until your doctor tells you that it is safe. General instructions  Use a flat pillow when you sleep.  Do not drive while wearing a soft neck collar. If you do not have a soft neck collar, ask your doctor if it is safe to drive while your neck heals.  Ask your doctor if the medicine prescribed to you requires you to avoid driving or using heavy machinery.  Do not use any products that contain nicotine or tobacco, such as cigarettes, e-cigarettes, and chewing tobacco. These can delay healing. If you need help quitting, ask your doctor.  Keep all follow-up visits as told by your doctor. This is important. Contact a doctor if:  Your condition does not get better with treatment. Get help right away if:  Your pain gets worse and is not helped with medicine.  You lose feeling or feel weak in your hand, arm, face, or leg.  You have a high fever.  You have a stiff neck.  You cannot control when you poop or pee (have incontinence).  You have trouble with walking, balance, or talking. Summary  Cervical radiculopathy means that a nerve in the neck is pinched or bruised.  A nerve can get pinched from a bulging disk, arthritis, an injury to the neck, or other causes.  Symptoms include pain, tingling, or loss of feeling that goes from the neck into the arm or hand.  Weakness in your arm or hand can happen in very bad cases.  Treatment may include resting,  wearing a soft neck collar, and doing exercises. You might need to take medicines for pain. In very bad cases, shots or surgery may be needed. This information is not intended to replace advice given to you by your health care provider. Make sure you discuss any questions you have with your health care provider. Document Released: 01/03/2011 Document Revised: 12/05/2017 Document Reviewed: 12/05/2017 Elsevier Patient Education  [place].  ?au vai Shoulder Pain Nhi?u nguyn nhn c th? gy ?au vai, bao g?m:  Ch?n th??ng vai.  V?n ??ng vai qu m?c.  Vim kh?p. Ngu?n gy ?au c th? l:  Vim.  Ch?n  th??ng kh?p vai.  Ch?n th??ng gn, dy ch?ng [place]?c x??ng. Tun th? nh?ng h??ng d?n ny ? nh: Ch  ??n cc thay ??i v? tri?u ch?ng c?a qu v?. Hy cho nguyn gia ch?m [place] s?c kh?e bi?t v? cc thay ??i ?. Lm theo cc h??ng d?n ny ?? gi?m ?au. N?u qu v? s? d?ng b?ng ?eo:  ?eo b?ng ?eo theo ch? d?n c?a chuyn gia ch?m [place] s?c kh?e. Ch? tho ra theo ch? d?n c?a chuyn gia ch?m [place] s?c kh?e.  N?i l?ng b?ng ?eo n?u cc ngn tay b? ?au bu?t, t [place]?c tr?? nn l?nh v xanh ti.  Gi? cho b?ng ?eo s?ch s?.  N?u b?ng ?eo khng ph?i lo?i ch?ng th?m n??c: ? Khng ?? n b? ??t. Tho n ra ?? t?m.  C? ??ng cnh tay cng t cng t?t, nh?ng lun c? ??ng bn tay c?a qu v? ?? trnh s?ng n?. X? tr ?au, c?ng kh?p v s?ng n?   N?u ???c ch? d?n, hy ch??m ? l?nh ln vng b? ?au: ? Cho ? l?nh vo ti ni lng. ? ?? kh?n t?m ? gi?a da v ti ch??m. ? Ch??m ? l?nh trong 20 pht, 2-3 l?n m?i ngy. D?ng ch??m ? l?nh n?u vi?c ? khng lm gi?m ?au.  Bp qu? bng m?m Win?c t?m b?t bi?n cng nhi?u cng t?t. Vi?c ny gip gi? cho vai khng b? s?ng. N c?ng gip t?ng c??ng s?c m?nh c?a cnh tay. H??ng d?n chung  Ch? s? d?ng thu?c khng k ??n v thu?c k ??n theo ch? d?n c?a chuyn gia ch?m [place] s?c kh?e.  Tun th? t?t c? cc l?n khm theo di theo ch? d?n c?a chuyn gia ch?m [place] s?c kh?e. ?i?u  ny c vai tr quan tr?ng. Hy lin l?c v?i chuyn gia ch?m [place] s?c kh?e n?u:  ?au tr? nn tr?m tr?ng h?n.  Qu v? khng h?t ?au sau khi dng thu?c.  ?au m?i b? ? cnh tay, bn tay [place]?c cc ngn tay c?a qu v?. Yu c?u tr? gip ngay l?p t?c n?u:  Cnh tay, bn tay Golda?c ngn tay c?a qu v?: ? ?au bu?t. ? B? t. ? B? s?ng. ? B? ?au. ? Chuy?n sang mu tr?ng [place]?c xanh. Tm t?t  ?au vai c th? do th??ng t?n, s? d?ng qu nhi?u, Andel?c vim kh?p gy ra.  Ch  ??n cc thay ??i v? tri?u ch?ng c?a qu v?. Hy cho nguyn gia ch?m [place] s?c kh?e bi?t v? cc thay ??i ?.  Tnh tr?ng ny c th? ???c ?i?u tr? b?ng b?ng ?eo, ? l?nh v thu?c gi?m ?au.  Lin h? v?i chuyn gia ch?m [place] s?c kh?e n?u c?n ?au tr?m tr?ng h?n [place]?c c c?n ?au m?i. Tm s? tr? gip ngay l?p t?c n?u cnh tay, bn tay [place]?c ngn tay c?a qu v? b? ?au bu?t [place]?c b? t, s?ng [place]iana?c ?au.  Tun th? t?t c? cc l?n khm theo di theo ch? d?n c?a chuyn gia ch?m [place] s?c kh?e. ?i?u ny c vai tr quan tr?ng. Thng tin ny khng nh?m m?c ?ch thay th? cho l?i khuyn m chuyn gia ch?m [place] s?c kh?e ni v?i qu v?. Hy b?o ??m qu v? ph?i th?o lu?n b?t k? v?n ?? g m qu v? c v?i chuyn gia ch?m  s?c kh?e c?a qu v?. Document Released: 07/16/2011 Document Revised: 09/15/2017 Document Reviewed: 09/15/2017 Elsevier Patient Education  2020 [employer].

## 2018-12-07 ENCOUNTER — Other Ambulatory Visit: Payer: Self-pay

## 2018-12-07 ENCOUNTER — Encounter: Payer: Self-pay | Admitting: Physical Therapy

## 2018-12-07 ENCOUNTER — Ambulatory Visit: Payer: Medicare Other | Admitting: Physical Therapy

## 2018-12-07 DIAGNOSIS — R2689 Other abnormalities of gait and mobility: Secondary | ICD-10-CM | POA: Diagnosis not present

## 2018-12-07 DIAGNOSIS — M25562 Pain in left knee: Secondary | ICD-10-CM | POA: Diagnosis not present

## 2018-12-07 DIAGNOSIS — M25662 Stiffness of left knee, not elsewhere classified: Secondary | ICD-10-CM

## 2018-12-07 DIAGNOSIS — R600 Localized edema: Secondary | ICD-10-CM

## 2018-12-07 NOTE — Therapy (Signed)
[place], [place], [address] Phone: [phone]   Fax:  [phone]  Physical Therapy Treatment  Patient Details  Name: Ariel Harris MRN: [id number] Date of Birth: 02/25/1945 Referring Provider (PT): Tiney Rouge [facility]    Encounter Date: 12/07/2018  PT End of Session - 12/07/18 0915    Visit Number  2    Number of Visits  16    Date for PT Re-Evaluation  01/26/19    Authorization Type  Meicare / Medicaid    PT Start Time  [phone]    PT Stop Time  1003    PT Time Calculation (min)  47 min    Activity Tolerance  Patient tolerated treatment well       Past Medical History:  Diagnosis Date  . Arthritis    "knees" (05/16/2017)  . Chest pain   . Hyperlipidemia   . Hypertension   . PAC (premature atrial contraction)   . SVT (supraventricular tachycardia) (HCC)     Past Surgical History:  Procedure Laterality Date  . JOINT REPLACEMENT    . KNEE ARTHROSCOPY Left 1996  . KNEE SURGERY Right [phone]   "open surgery; because of the pain"  . 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 TOTAL KNEE ARTHROPLASTY Right 05/16/2017   Procedure: RIGHT TOTAL KNEE ARTHROPLASTY;  Surgeon: Leandrew Koyanagi, MD;  Location: [place];  Service: Orthopedics;  Laterality: Right;  . TOTAL KNEE ARTHROPLASTY Left 11/16/2018   Procedure: LEFT TOTAL KNEE ARTHROPLASTY;  Surgeon: Leandrew Koyanagi, MD;  Location: [place];  Service: Orthopedics;  Laterality: Left;    There were no vitals filed for this visit.  Subjective Assessment - 12/07/18 0916    Subjective  The knee feels better lately, still has swelling however not as bad    Patient is accompained by:  Interpreter   Ariel Harris [phone]   Currently in Pain?  Yes    Pain Score  4     Pain Location  Knee    Pain Orientation  Left    Pain Descriptors / Indicators  Dull    Pain Type  Surgical pain    Pain Frequency  Intermittent    Aggravating Factors   increased movement    Pain  Relieving Factors  ice         [facility] PT Assessment - 12/07/18 0001      Assessment   Medical Diagnosis  Left TKA       AROM   Left Knee Extension  -12    Left Knee Flexion  98                   [facility] Adult PT Treatment/Exercise - 12/07/18 0001      Ambulation/Gait   Gait Comments  pt able to walk without walker in the clinic slow cadence, decreased knee flexion at toe off, no loss of balance      Exercises   Exercises  Knee/Hip      Knee/Hip Exercises: Stretches   Passive Hamstring Stretch  Left;60 seconds   supine with strap     Knee/Hip Exercises: Aerobic   Stationary Bike  for ROM, half circles initially then full revolutions    Nustep  L3x5' for ROM      Knee/Hip Exercises: Standing   SLS  LT with multiple VC using interrpretor to assist    Other Standing Knee Exercises  wt shifts FWD BWD with Lt LE infront and behind  Knee/Hip Exercises: Seated   Long Arc Quad  Strengthening;Left;2 sets;10 reps;Weights    Long Arc Quad Weight  3 lbs.    Sit to Sand  10 reps;without UE support   then stand bilat, lower Lt LE      Knee/Hip Exercises: Supine   Quad Sets  Strengthening;Left;10 reps   10 sec holds with foot on bolster   Bridges  Strengthening;Both;20 reps   VC for form     Manual Therapy   Manual Therapy  Joint mobilization    Joint Mobilization  Lt patellar superior and inferior and grade III into knee extension.    and PROM Lt knee flex            PT Education - 12/07/18 0936    Education Details  sLS on Left with interpretor help    Person(s) Educated  Patient    Methods  Verbal cues;Handout;Explanation;Demonstration    Comprehension  Verbalized understanding;Verbal cues required       PT Short Term Goals - 12/01/18 1406      PT SHORT TERM GOAL #1   Title  Patient will increase left knee extension passive motion by 5 degrees    Time  4    Period  Weeks    Status  New    Target Date  12/29/18      PT SHORT TERM GOAL #2    Title  Patient will increase passive knee flexion by 10 degrees    Time  3    Period  Weeks    Status  New    Target Date  12/22/18      PT SHORT TERM GOAL #3   Title  Patient will demonstrate decreased mid patella edema by 2.5 cm    Time  3    Period  Weeks    Status  New    Target Date  12/22/18      PT SHORT TERM GOAL #4   Title  Patient will increase knee extension strength and left hip flexion strength by 1 grade    Time  3    Period  Weeks    Status  New    Target Date  12/22/18        PT Long Term Goals - 12/01/18 1409      PT LONG TERM GOAL #1   Title  Patient will go up/down 4 steps without any pain with reciprocal pattern    Time  8    Period  Weeks    Status  New    Target Date  01/26/19      PT LONG TERM GOAL #2   Title  Patient will bend to pick object up without pain    Time  8    Period  Weeks    Status  New    Target Date  01/26/19      PT LONG TERM GOAL #3   Title  Patient will ambualte 3000' in the community without a device or increased pain.    Time  8    Period  Weeks    Status  New    Target Date  01/26/19            Plan - 12/07/18 0959    Clinical Impression Statement  Pt with improved Lt knee AROM, tolerated higher level exercise today.  She will ice at home per her request.  Only second visit, no goals met, she is highly motivated.  Did have  some difficulty following instructions for exercise even with interpretor    PT Frequency  2x / week    PT Duration  6 weeks    PT Treatment/Interventions  ADLs/Self Care Home Management;Cryotherapy;Electrical Stimulation;Iontophoresis 91m/ml Dexamethasone;Ultrasound;Gait training;Stair training;Functional mobility training;Therapeutic activities;Therapeutic exercise;Neuromuscular re-education;Patient/family education;Manual techniques;Passive range of motion;Taping;DME Instruction    Consulted and Agree with Plan of Care  Patient;Other (Comment)   interportor      Patient will benefit from  skilled therapeutic intervention in order to improve the following deficits and impairments:  Abnormal gait, Difficulty walking, Decreased endurance, Decreased range of motion, Decreased activity tolerance, Decreased safety awareness, Pain, Increased fascial restricitons, Improper body mechanics, Impaired perceived functional ability  Visit Diagnosis: Stiffness of left knee, not elsewhere classified  Acute pain of left knee  Other abnormalities of gait and mobility  Localized edema     Problem List Patient Active Problem List   Diagnosis Date Noted  . 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 [place]) [place] 07/09/[place]  . Osteoporosis 05/25/2017  . Status post total right knee replacement 05/16/2017  . Dyslipidemia 01/12/2010  . Hypertension 01/12/2010    Ariel PinchPT 12/07/2018, 10:04 AM  [facility] [address]Phone: [phone]  Fax:  [phone] Name: TLandrie BealeMRN: [id number] of Birth: 208-28-1947

## 2018-12-07 NOTE — Patient Instructions (Signed)
Access Code: [facility]  URL: [email or web address]  Date: 12/07/2018  Prepared by: Jeral Pinch   Exercises  Standing Single Leg Stance with Counter Support - 10 reps - 3-4x daily

## 2018-12-09 ENCOUNTER — Ambulatory Visit: Payer: Medicare Other | Admitting: Physical Therapy

## 2018-12-09 ENCOUNTER — Encounter: Payer: Self-pay | Admitting: Physical Therapy

## 2018-12-09 ENCOUNTER — Other Ambulatory Visit: Payer: Self-pay

## 2018-12-09 DIAGNOSIS — M25662 Stiffness of left knee, not elsewhere classified: Secondary | ICD-10-CM

## 2018-12-09 DIAGNOSIS — M25562 Pain in left knee: Secondary | ICD-10-CM

## 2018-12-09 DIAGNOSIS — R2689 Other abnormalities of gait and mobility: Secondary | ICD-10-CM

## 2018-12-09 DIAGNOSIS — R600 Localized edema: Secondary | ICD-10-CM

## 2018-12-09 NOTE — Therapy (Signed)
[place], [place], [address] Phone: [phone]   Fax:  [phone]  Physical Therapy Treatment  Patient Details  Name: Ariel Harris MRN: [id number] Date of Birth: 07-19-1945 Referring Provider (PT): Tiney Rouge [facility]    Encounter Date: 12/09/2018  PT End of Session - 12/09/18 0919    Visit Number  3    Number of Visits  16    Date for PT Re-Evaluation  01/26/19    Authorization Type  Meicare / Medicaid    PT Start Time  0915    PT Stop Time  1002    PT Time Calculation (min)  47 min    Activity Tolerance  Patient tolerated treatment well    Behavior During Therapy  [facility] for tasks assessed/performed       Past Medical History:  Diagnosis Date  . Arthritis    "knees" (05/16/2017)  . Chest pain   . Hyperlipidemia   . Hypertension   . PAC (premature atrial contraction)   . SVT (supraventricular tachycardia) (HCC)     Past Surgical History:  Procedure Laterality Date  . JOINT REPLACEMENT    . KNEE ARTHROSCOPY Left 1996  . KNEE SURGERY Right [phone]   "open surgery; because of the pain"  . 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 TOTAL KNEE ARTHROPLASTY Right 05/16/2017   Procedure: RIGHT TOTAL KNEE ARTHROPLASTY;  Surgeon: Leandrew Koyanagi, MD;  Location: [place];  Service: Orthopedics;  Laterality: Right;  . TOTAL KNEE ARTHROPLASTY Left 11/16/2018   Procedure: LEFT TOTAL KNEE ARTHROPLASTY;  Surgeon: Leandrew Koyanagi, MD;  Location: [place];  Service: Orthopedics;  Laterality: Left;    There were no vitals filed for this visit.  Subjective Assessment - 12/09/18 0919    Subjective  Pt reports she is walking more since I saw her Monday without her walker.    Patient is accompained by:  Interpreter   Columbia Vu # [phone]   Patient Stated Goals  walk faster , less pain    Currently in Pain?  Yes    Pain Score  5     Pain Location  Knee    Pain Orientation  Left    Pain Descriptors / Indicators   Aching;Tightness;Sore    Pain Type  Surgical pain    Pain Radiating Towards  into calf    Pain Frequency  Intermittent                       [facility] Adult PT Treatment/Exercise - 12/09/18 0001      Knee/Hip Exercises: Stretches   Active Hamstring Stretch  Left   with strap, 30 knee presses   Quad Stretch  Left;3 reps;60 seconds   prone with strap   Gastroc Stretch  Left;2 reps;60 seconds      Knee/Hip Exercises: Aerobic   Nustep  L4x5' for ROM      Knee/Hip Exercises: Standing   Terminal Knee Extension  Strengthening;Left;20 reps    Theraband Level (Terminal Knee Extension)  --   pressing into ball   SLS  LT, 15 reps Rt toe tap FWD/side/BWD with chair for balance, VC for upright posture and form    Other Standing Knee Exercises  wt shift over LT LE to work on hip flexion stretch      Knee/Hip Exercises: Seated   Long Arc Quad  AROM   to loosen the knee   Other Seated Knee/Hip Exercises  self knee flexion mobs in sitting with scoots FWD      Knee/Hip Exercises: Prone   Other Prone Exercises  knee presses with feet in DF       Manual Therapy   Joint Mobilization  Lt knee into extension with foot supported on bolster, grade III               PT Short Term Goals - 12/01/18 1406      PT SHORT TERM GOAL #1   Title  Patient will increase left knee extension passive motion by 5 degrees    Time  4    Period  Weeks    Status  New    Target Date  12/29/18      PT SHORT TERM GOAL #2   Title  Patient will increase passive knee flexion by 10 degrees    Time  3    Period  Weeks    Status  New    Target Date  12/22/18      PT SHORT TERM GOAL #3   Title  Patient will demonstrate decreased mid patella edema by 2.5 cm    Time  3    Period  Weeks    Status  New    Target Date  12/22/18      PT SHORT TERM GOAL #4   Title  Patient will increase knee extension strength and left hip flexion strength by 1 grade    Time  3    Period  Weeks    Status  New     Target Date  12/22/18        PT Long Term Goals - 12/01/18 1409      PT LONG TERM GOAL #1   Title  Patient will go up/down 4 steps without any pain with reciprocal pattern    Time  8    Period  Weeks    Status  New    Target Date  01/26/19      PT LONG TERM GOAL #2   Title  Patient will bend to pick object up without pain    Time  8    Period  Weeks    Status  New    Target Date  01/26/19      PT LONG TERM GOAL #3   Title  Patient will ambualte 3000' in the community without a device or increased pain.    Time  8    Period  Weeks    Status  New    Target Date  01/26/19            Plan - 12/09/18 0955    Clinical Impression Statement  Therisa did better with directions today with the interpreter.  She is very tight in the Lt hip flexors causing forward flexed posture and difficulty moving into toe off during ambulation.  Needs to contnue to work on quad activation as well as knee ROM and LE strength.    Rehab Potential  Good    PT Frequency  2x / week    PT Duration  6 weeks    PT Treatment/Interventions  ADLs/Self Care Home Management;Cryotherapy;Electrical Stimulation;Iontophoresis 4mg /ml Dexamethasone;Ultrasound;Gait training;Stair training;Functional mobility training;Therapeutic activities;Therapeutic exercise;Neuromuscular re-education;Patient/family education;Manual techniques;Passive range of motion;Taping;DME Instruction    PT Next Visit Plan  Lt knee ROM, strengthening, gait    Consulted and Agree with Plan of Care  Patient   interpreter      Patient will benefit from skilled therapeutic intervention  in order to improve the following deficits and impairments:  Abnormal gait, Difficulty walking, Decreased endurance, Decreased range of motion, Decreased activity tolerance, Decreased safety awareness, Pain, Increased fascial restricitons, Improper body mechanics, Impaired perceived functional ability  Visit Diagnosis: Stiffness of left knee, not elsewhere  classified  Acute pain of left knee  Other abnormalities of gait and mobility  Localized edema     Problem List Patient Active Problem List   Diagnosis Date Noted  . 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 SVT (supraventricular tachycardia) ([place]) 07/09/2018  . Osteoporosis 05/25/2017  . Status post total right knee replacement 05/16/2017  . Dyslipidemia 01/12/2010  . Hypertension 01/12/2010    Jeral Pinch PT  12/09/2018, 10:00 AM  [facility] [address] [place], [place], [address] Phone: [phone]   Fax:  [phone]  Name: Denzel Bacon MRN: [id number] Date of Birth: [birth year]

## 2018-12-14 ENCOUNTER — Ambulatory Visit: Payer: Medicare Other | Admitting: Physical Therapy

## 2018-12-14 ENCOUNTER — Other Ambulatory Visit: Payer: Self-pay

## 2018-12-14 ENCOUNTER — Encounter: Payer: Self-pay | Admitting: Physical Therapy

## 2018-12-14 DIAGNOSIS — R2689 Other abnormalities of gait and mobility: Secondary | ICD-10-CM

## 2018-12-14 DIAGNOSIS — M25562 Pain in left knee: Secondary | ICD-10-CM | POA: Diagnosis not present

## 2018-12-14 DIAGNOSIS — M25662 Stiffness of left knee, not elsewhere classified: Secondary | ICD-10-CM

## 2018-12-14 DIAGNOSIS — R600 Localized edema: Secondary | ICD-10-CM

## 2018-12-14 NOTE — Therapy (Signed)
[place], [place], [address] Phone: [phone]   Fax:  [phone]  Physical Therapy Treatment  Patient Details  Name: Ariel Harris MRN: [id number] Date of Birth: [birth year] Referring Provider (PT): Tiney [facility]    Encounter Date: 12/14/2018  PT End of Session - 12/14/18 1005    Visit Number  4    Number of Visits  16    Date for PT Re-Evaluation  01/26/19    PT Start Time  1000    PT Stop Time  1100    PT Time Calculation (min)  60 min    Activity Tolerance  Patient tolerated treatment well    Behavior During Therapy  [facility] for tasks assessed/performed       Past Medical History:  Diagnosis Date  . Arthritis    "knees" (05/16/2017)  . Chest pain   . Hyperlipidemia   . Hypertension   . PAC (premature atrial contraction)   . SVT (supraventricular tachycardia) (HCC)     Past Surgical History:  Procedure Laterality Date  . JOINT REPLACEMENT    . KNEE ARTHROSCOPY Left 1996  . KNEE SURGERY Right [phone]   "open surgery; because of the pain"  . 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 TOTAL KNEE ARTHROPLASTY Right 05/16/2017   Procedure: RIGHT TOTAL KNEE ARTHROPLASTY;  Surgeon: Leandrew Koyanagi, MD;  Location: [place];  Service: Orthopedics;  Laterality: Right;  . TOTAL KNEE ARTHROPLASTY Left 11/16/2018   Procedure: LEFT TOTAL KNEE ARTHROPLASTY;  Surgeon: Leandrew Koyanagi, MD;  Location: [place];  Service: Orthopedics;  Laterality: Left;    There were no vitals filed for this visit.  Subjective Assessment - 12/14/18 1014    Subjective  Tries not to use her walker.  Knee is stiff but not painful .    Currently in Pain?  Yes    Pain Score  3     Pain Location  Knee    Pain Orientation  Left    Pain Descriptors / Indicators  Tightness    Pain Type  Surgical pain    Pain Onset  More than a month ago    Pain Frequency  Intermittent             [facility] Adult PT Treatment/Exercise -  12/14/18 0001      Knee/Hip Exercises: Stretches   Active Hamstring Stretch  Left    Active Hamstring Stretch Limitations  extension prop for 2 min     Knee: Self-Stretch to increase Flexion  Left;10 seconds    Knee: Self-Stretch Limitations  x 10       Knee/Hip Exercises: Supine   Quad Sets  Strengthening;Both;2 sets;15 reps    Short Arc Quad Sets  Strengthening;Left;1 set;10 reps    Short Arc Quad Sets Limitations  cues    Straight Leg Raises  Strengthening;Left;1 set;15 reps    Straight Leg Raises Limitations  quad lag 15 deg or more       Vasopneumatic   Number Minutes Vasopneumatic   15 minutes    Vasopnuematic Location   Knee    Vasopneumatic Pressure  Low    Vasopneumatic Temperature   34 deg C      Manual Therapy   Manual therapy comments  patellar mobs , PROM L knee     Joint Mobilization  GR II -III flexion and extension mobilizations  PT Short Term Goals - 12/01/18 1406      PT SHORT TERM GOAL #1   Title  Patient will increase left knee extension passive motion by 5 degrees    Time  4    Period  Weeks    Status  New    Target Date  12/29/18      PT SHORT TERM GOAL #2   Title  Patient will increase passive knee flexion by 10 degrees    Time  3    Period  Weeks    Status  New    Target Date  12/22/18      PT SHORT TERM GOAL #3   Title  Patient will demonstrate decreased mid patella edema by 2.5 cm    Time  3    Period  Weeks    Status  New    Target Date  12/22/18      PT SHORT TERM GOAL #4   Title  Patient will increase knee extension strength and left hip flexion strength by 1 grade    Time  3    Period  Weeks    Status  New    Target Date  12/22/18        PT Long Term Goals - 12/01/18 1409      PT LONG TERM GOAL #1   Title  Patient will go up/down 4 steps without any pain with reciprocal pattern    Time  8    Period  Weeks    Status  New    Target Date  01/26/19      PT LONG TERM GOAL #2   Title  Patient will bend  to pick object up without pain    Time  8    Period  Weeks    Status  New    Target Date  01/26/19      PT LONG TERM GOAL #3   Title  Patient will ambualte 3000' in the community without a device or increased pain.    Time  8    Period  Weeks    Status  New    Target Date  01/26/19            Plan - 12/14/18 1051    Clinical Impression Statement  The patient has significant tightness in L knee, able to tolerate manual to increase flexion to 96 deg PROM.  Swelling is an issue.  Spent time answering questions about knee propping, steri-strips and swelling.  Walks without assistive device but looks a bit unsteady.    PT Treatment/Interventions  ADLs/Self Care Home Management;Cryotherapy;Electrical Stimulation;Iontophoresis 4mg /ml Dexamethasone;Ultrasound;Gait training;Stair training;Functional mobility training;Therapeutic activities;Therapeutic exercise;Neuromuscular re-education;Patient/family education;Manual techniques;Passive range of motion;Taping;DME Instruction    PT Next Visit Plan  goals, swelling, Lt knee ROM, strengthening, gait, further strengthening    PT Home Exercise Plan  knee AROM, likely has home health HEP    Consulted and Agree with Plan of Care  Patient       Patient will benefit from skilled therapeutic intervention in order to improve the following deficits and impairments:  Abnormal gait, Difficulty walking, Decreased endurance, Decreased range of motion, Decreased activity tolerance, Decreased safety awareness, Pain, Increased fascial restricitons, Improper body mechanics, Impaired perceived functional ability  Visit Diagnosis: Stiffness of left knee, not elsewhere classified  Acute pain of left knee  Other abnormalities of gait and mobility  Localized edema     Problem List Patient Active Problem List   Diagnosis Date  Noted  . 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 SVT (supraventricular tachycardia) ([place]) 07/09/2018  .  Osteoporosis 05/25/2017  . Status post total right knee replacement 05/16/2017  . Dyslipidemia 01/12/2010  . Hypertension 01/12/2010    , 12/14/2018, 10:59 AM  [place], [place], [address] Phone: [phone]   Fax:  [phone]  Name: Ariel Harris MRN: [id number] Date of Birth: 1945-05-12  Raeford Razor, PT 12/14/18 10:59 AM Phone: [phone] Fax: [phone]

## 2018-12-16 ENCOUNTER — Ambulatory Visit: Payer: Medicare Other | Admitting: Physical Therapy

## 2018-12-16 ENCOUNTER — Encounter: Payer: Self-pay | Admitting: Physical Therapy

## 2018-12-16 ENCOUNTER — Other Ambulatory Visit: Payer: Self-pay

## 2018-12-16 DIAGNOSIS — R2689 Other abnormalities of gait and mobility: Secondary | ICD-10-CM | POA: Diagnosis not present

## 2018-12-16 DIAGNOSIS — M25662 Stiffness of left knee, not elsewhere classified: Secondary | ICD-10-CM | POA: Diagnosis not present

## 2018-12-16 DIAGNOSIS — R600 Localized edema: Secondary | ICD-10-CM

## 2018-12-16 DIAGNOSIS — M25562 Pain in left knee: Secondary | ICD-10-CM | POA: Diagnosis not present

## 2018-12-16 NOTE — Therapy (Signed)
[place], [place], [address] Phone: [phone]   Fax:  [phone]  Physical Therapy Treatment  Patient Details  Name: Ariel Harris MRN: [id number] Date of Birth: Dec 28, 1945 Referring Provider (PT): Tiney Rouge [facility]    Encounter Date: 12/16/2018  PT End of Session - 12/16/18 1003    Visit Number  5    Number of Visits  16    Date for PT Re-Evaluation  01/26/19    Authorization Type  Meicare / Medicaid    PT Start Time  1000    PT Stop Time  1048    PT Time Calculation (min)  48 min    Activity Tolerance  Patient tolerated treatment well    Behavior During Therapy  [facility] for tasks assessed/performed       Past Medical History:  Diagnosis Date  . Arthritis    "knees" (05/16/2017)  . Chest pain   . Hyperlipidemia   . Hypertension   . PAC (premature atrial contraction)   . SVT (supraventricular tachycardia) (HCC)     Past Surgical History:  Procedure Laterality Date  . JOINT REPLACEMENT    . KNEE ARTHROSCOPY Left 1996  . KNEE SURGERY Right [phone]   "open surgery; because of the pain"  . 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 TOTAL KNEE ARTHROPLASTY Right 05/16/2017   Procedure: RIGHT TOTAL KNEE ARTHROPLASTY;  Surgeon: Leandrew Koyanagi, MD;  Location: [place];  Service: Orthopedics;  Laterality: Right;  . TOTAL KNEE ARTHROPLASTY Left 11/16/2018   Procedure: LEFT TOTAL KNEE ARTHROPLASTY;  Surgeon: Leandrew Koyanagi, MD;  Location: [place];  Service: Orthopedics;  Laterality: Left;    There were no vitals filed for this visit.      [facility] PT Assessment - 12/16/18 0001      Circumferential Edema   Circumferential - Right  42    Circumferential - Left   44        [facility] Adult PT Treatment/Exercise - 12/16/18 0001      Ambulation/Gait   Assistive device  None    Gait Pattern  Step-through pattern;Decreased stance time - left;Decreased stride length;Decreased dorsiflexion -  right;Decreased dorsiflexion - left;Antalgic    Ambulation Surface  Level;Indoor    Gait Comments  close supervision       Posture/Postural Control   Posture Comments  trunk flexion      Self-Care   Self-Care  Other Self-Care Comments    Other Self-Care Comments   asked about gait, cane during  cold pack       Knee/Hip Exercises: Stretches   Active Hamstring Stretch  Left    Active Hamstring Stretch Limitations  Strap and then towel AROM L knee flex, ext       Knee/Hip Exercises: Aerobic   Isokinetic       Nustep  L4x5' for ROM      Knee/Hip Exercises: Standing   Heel Raises  Both;1 set;20 reps    Heel Raises Limitations  finger tip Asst     Terminal Knee Extension  Strengthening;Left;1 set;20 reps    Terminal Knee Extension Limitations  at wall     Functional Squat  1 set;10 reps    Functional Squat Limitations  at chair     Other Standing Knee Exercises  wt shift over LT LE to work on hip flexion stretch and quad activation at wall       Knee/Hip Exercises: Seated   Long Arc  Quad  AROM;Strengthening;Left;1 set      Knee/Hip Exercises: Supine   Quad Sets  Strengthening;Both;2 sets;15 reps    Quad Sets Limitations  ball helped       Cryotherapy   Number Minutes Cryotherapy  10 Minutes    Cryotherapy Location  Knee    Type of Cryotherapy  Ice pack      Manual Therapy   Manual Therapy  Joint mobilization    Manual therapy comments  patellar mobs , PROM L knee     Joint Mobilization  knee flexion in sitting, Mulligan strap mob for flexion and then supine Gr II-III extension mobs                PT Short Term Goals - 12/16/18 1045      PT SHORT TERM GOAL #1   Title  Patient will increase left knee extension passive motion by 5 degrees    Status  On-going      PT SHORT TERM GOAL #2   Title  Patient will increase passive knee flexion by 10 degrees    Status  On-going      PT SHORT TERM GOAL #3   Title  Patient will demonstrate decreased mid patella edema by  2.5 cm    Status  Achieved      PT SHORT TERM GOAL #4   Title  Patient will increase knee extension strength and left hip flexion strength by 1 grade    Status  On-going        PT Long Term Goals - 12/16/18 1046      PT LONG TERM GOAL #1   Title  Patient will go up/down 4 steps without any pain with reciprocal pattern    Baseline  No steps in the house but does have 3 to get into the home    Period  Weeks    Status  On-going      PT LONG TERM GOAL #2   Title  Patient will bend to pick object up without pain    Status  On-going      PT LONG TERM GOAL #3   Title  Patient will ambualte 3000' in the community without a device or increased pain.    Status  On-going            Plan - 12/16/18 1003    Clinical Impression Statement  Pt requested not to use the [employer] interpreter as she has a good baseline of English.  I did use it at the end of the session in order to ask her some questions related to her gait and function at home and to ensure understanding of concepts.  She is open to using a cane and I think it would benefit her posture and balance.    PT Treatment/Interventions  ADLs/Self Care Home Management;Cryotherapy;Electrical Stimulation;Iontophoresis 4mg /ml Dexamethasone;Ultrasound;Gait training;Stair training;Functional mobility training;Therapeutic activities;Therapeutic exercise;Neuromuscular re-education;Patient/family education;Manual techniques;Passive range of motion;Taping;DME Instruction    PT Next Visit Plan  walk with cane, swelling, Lt knee ROM, strengthening    PT Home Exercise Plan  knee AROM, likely has home health HEP, green band hamstring stretch and activation    Consulted and Agree with Plan of Care  Patient       Patient will benefit from skilled therapeutic intervention in order to improve the following deficits and impairments:  Abnormal gait, Difficulty walking, Decreased endurance, Decreased range of motion, Decreased activity tolerance, Decreased  safety awareness, Pain, Increased fascial restricitons, Improper body mechanics,  Impaired perceived functional ability  Visit Diagnosis: Stiffness of left knee, not elsewhere classified  Acute pain of left knee  Other abnormalities of gait and mobility  Localized edema     Problem List Patient Active Problem List   Diagnosis Date Noted  . 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 SVT (supraventricular tachycardia) ([place]) 07/09/2018  . Osteoporosis 05/25/2017  . Status post total right knee replacement 05/16/2017  . Dyslipidemia 01/12/2010  . Hypertension 01/12/2010    PAA,JENNIFER 12/16/2018, 11:03 AM  [facility] [address] [place], [place], [address] Phone: [phone]   Fax:  [phone]  Name: Jasiah Houy MRN: [id number] Date of Birth: 12-08-1945  Raeford Razor, PT 12/16/18 11:03 AM Phone: [phone] Fax: [phone]

## 2018-12-17 ENCOUNTER — Telehealth: Payer: Self-pay | Admitting: *Deleted

## 2018-12-17 NOTE — Care Plan (Signed)
RNCM call to patient, with assistance of [employer], to check status as 1 month post-op from a Left TKA with Dr. Erlinda Hong. She verbalized she is having no current issues with her knee, except for pain occasionally with prolonged ambulation. She is attending OPPT and states this is going well. Requested a refill of her pain medication. CM will send a message to Dr. Erlinda Hong for his recommendations on refill or changing to something else since she is 1 month post-op. She verbalized understanding. Reviewed 30 day Patient Satisfaction Survey with patient. Reviewed survey provided by [facility] in place of 10 question survey previously in Epic.  1. Prior to surgery I was provided sufficient education regarding my surgery and the bundle program.  Patient answer: Strongly agree 2. I was satisfied with the care I received at the facility where my surgery was performed. Patient answer: Strongly agree 3. Following surgery, I received sufficient postoperative care instructions. Patient answer: Strongly agree 4. I would recommend my surgeon and this bundle program to others. Patient answer: Strongly agree Other comments: "I tell everyone how much I love Dr. Erlinda Hong"

## 2018-12-17 NOTE — Telephone Encounter (Signed)
30 day Ortho bundle note completed.

## 2018-12-21 ENCOUNTER — Encounter: Payer: Self-pay | Admitting: Physical Therapy

## 2018-12-21 ENCOUNTER — Ambulatory Visit: Payer: Medicare Other | Admitting: Physical Therapy

## 2018-12-21 ENCOUNTER — Other Ambulatory Visit: Payer: Self-pay

## 2018-12-21 DIAGNOSIS — R600 Localized edema: Secondary | ICD-10-CM

## 2018-12-21 DIAGNOSIS — M25662 Stiffness of left knee, not elsewhere classified: Secondary | ICD-10-CM | POA: Diagnosis not present

## 2018-12-21 DIAGNOSIS — M25562 Pain in left knee: Secondary | ICD-10-CM

## 2018-12-21 DIAGNOSIS — R2689 Other abnormalities of gait and mobility: Secondary | ICD-10-CM | POA: Diagnosis not present

## 2018-12-21 NOTE — Therapy (Signed)
[place], [place], [address] Phone: [phone]   Fax:  [phone]  Physical Therapy Treatment  Patient Details  Name: Ariel Harris MRN: [id number] Date of Birth: 1945-06-11 Referring Provider (PT): Tiney Rouge [facility]    Encounter Date: 12/21/2018  PT End of Session - 12/21/18 1132    Visit Number  6    Number of Visits  16    Date for PT Re-Evaluation  01/26/19    Authorization Type  Meicare / Medicaid    PT Start Time  1132    PT Stop Time  1212    PT Time Calculation (min)  40 min    Activity Tolerance  Patient tolerated treatment well    Behavior During Therapy  [facility] for tasks assessed/performed       Past Medical History:  Diagnosis Date  . Arthritis    "knees" (05/16/2017)  . Chest pain   . Hyperlipidemia   . Hypertension   . PAC (premature atrial contraction)   . SVT (supraventricular tachycardia) (HCC)     Past Surgical History:  Procedure Laterality Date  . JOINT REPLACEMENT    . KNEE ARTHROSCOPY Left 1996  . KNEE SURGERY Right [phone]   "open surgery; because of the pain"  . 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 TOTAL KNEE ARTHROPLASTY Right 05/16/2017   Procedure: RIGHT TOTAL KNEE ARTHROPLASTY;  Surgeon: Leandrew Koyanagi, MD;  Location: [place];  Service: Orthopedics;  Laterality: Right;  . TOTAL KNEE ARTHROPLASTY Left 11/16/2018   Procedure: LEFT TOTAL KNEE ARTHROPLASTY;  Surgeon: Leandrew Koyanagi, MD;  Location: [place];  Service: Orthopedics;  Laterality: Left;    There were no vitals filed for this visit.  Subjective Assessment - 12/21/18 1132    Subjective  Pt reports she is doing well.  Reported she didnt' think she needed the interpretor service.    Currently in Pain?  No/denies         [facility] PT Assessment - 12/21/18 0001      Assessment   Medical Diagnosis  Left TKA     Referring Provider (PT)  Tiney Rouge [facility]       AROM   Left Knee Extension  -10    Left Knee Flexion  95                   [facility] Adult PT Treatment/Exercise - 12/21/18 0001      Ambulation/Gait   Gait Comments  supervision - BWD walking, side to side and FWD.  VC to work through normal pattern      Knee/Hip Exercises: Aerobic   Stationary Bike  for ROM, half circles initially then full revolutions      Knee/Hip Exercises: Seated   Long Arc Quad  Strengthening;Left;3 sets;10 reps   5# , VC to work in full available ROM   Other Seated Knee/Hip Exercises  3x10 hamstring curls with yellow band    Other Seated Knee/Hip Exercises  long sit SLR Lt toes up and the out , had a lot of difficulty with toes rotated out       Knee/Hip Exercises: Supine   Quad Sets  Strengthening;Left;10 reps   with roll under ankle   Short Arc Quad Sets  Strengthening;Left;3 sets;10 reps   wiht 5#     Manual Therapy   Manual Therapy  Soft tissue mobilization;Joint mobilization    Joint Mobilization  in prone worked on knee extension with  holds     Soft tissue mobilization  STM to Lt hamstring and gastroc                PT Short Term Goals - 12/16/18 1045      PT SHORT TERM GOAL #1   Title  Patient will increase left knee extension passive motion by 5 degrees    Status  On-going      PT SHORT TERM GOAL #2   Title  Patient will increase passive knee flexion by 10 degrees    Status  On-going      PT SHORT TERM GOAL #3   Title  Patient will demonstrate decreased mid patella edema by 2.5 cm    Status  Achieved      PT SHORT TERM GOAL #4   Title  Patient will increase knee extension strength and left hip flexion strength by 1 grade    Status  On-going        PT Long Term Goals - 12/16/18 1046      PT LONG TERM GOAL #1   Title  Patient will go up/down 4 steps without any pain with reciprocal pattern    Baseline  No steps in the house but does have 3 to get into the home    Period  Weeks    Status  On-going      PT LONG TERM GOAL #2   Title  Patient will  bend to pick object up without pain    Status  On-going      PT LONG TERM GOAL #3   Title  Patient will ambualte 3000' in the community without a device or increased pain.    Status  On-going            Plan - 12/21/18 1136    Clinical Impression Statement  Pt ambulated into the clinic with a more upright posture.  Does still have antalgic gait d/t lack of knee extension. There was palpable heat in the knee today however patient reported it didnt' hurt. She would benefit from continued PT to improve her knee extension, work on proprioception, gait and strength    Rehab Potential  Good    PT Frequency  2x / week    PT Duration  6 weeks    PT Treatment/Interventions  ADLs/Self Care Home Management;Cryotherapy;Electrical Stimulation;Iontophoresis 4mg /ml Dexamethasone;Ultrasound;Gait training;Stair training;Functional mobility training;Therapeutic activities;Therapeutic exercise;Neuromuscular re-education;Patient/family education;Manual techniques;Passive range of motion;Taping;DME Instruction    PT Next Visit Plan  gait, knee ROM, strength and  proprioception    Consulted and Agree with Plan of Care  Patient       Patient will benefit from skilled therapeutic intervention in order to improve the following deficits and impairments:  Abnormal gait, Difficulty walking, Decreased endurance, Decreased range of motion, Decreased activity tolerance, Decreased safety awareness, Pain, Increased fascial restricitons, Improper body mechanics, Impaired perceived functional ability  Visit Diagnosis: Stiffness of left knee, not elsewhere classified  Acute pain of left knee  Other abnormalities of gait and mobility  Localized edema     Problem List Patient Active Problem List   Diagnosis Date Noted  . 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 SVT (supraventricular tachycardia) ([place]) 07/09/2018  . Osteoporosis 05/25/2017  . Status post total right knee replacement 05/16/2017  .  Dyslipidemia 01/12/2010  . Hypertension 01/12/2010    Jeral Pinch PT  12/21/2018, 12:15 PM  [facility] [address] [place], [place], [address] Phone:  [phone]   Fax:  [phone]  Name: Colin Puck MRN: [id number] Date of Birth: 1945-06-03

## 2018-12-23 ENCOUNTER — Encounter: Payer: Self-pay | Admitting: Physical Therapy

## 2018-12-23 ENCOUNTER — Other Ambulatory Visit: Payer: Self-pay

## 2018-12-23 ENCOUNTER — Ambulatory Visit: Payer: Medicare Other | Admitting: Physical Therapy

## 2018-12-23 DIAGNOSIS — R600 Localized edema: Secondary | ICD-10-CM | POA: Diagnosis not present

## 2018-12-23 DIAGNOSIS — M25662 Stiffness of left knee, not elsewhere classified: Secondary | ICD-10-CM | POA: Diagnosis not present

## 2018-12-23 DIAGNOSIS — R2689 Other abnormalities of gait and mobility: Secondary | ICD-10-CM | POA: Diagnosis not present

## 2018-12-23 DIAGNOSIS — M25562 Pain in left knee: Secondary | ICD-10-CM

## 2018-12-23 NOTE — Therapy (Signed)
[place], [place], [address] Phone: [phone]   Fax:  [phone]  Physical Therapy Treatment  Patient Details  Name: Ariel Harris MRN: [id number] Date of Birth: Jan 13, 1946 Referring Provider (PT): Tiney Rouge [facility]    Encounter Date: 12/23/2018  PT End of Session - 12/23/18 1526    Visit Number  7    Number of Visits  16    Date for PT Re-Evaluation  01/26/19    Authorization Type  Meicare / Medicaid    PT Start Time  1106    PT Stop Time  1145    PT Time Calculation (min)  39 min    Activity Tolerance  Patient tolerated treatment well    Behavior During Therapy  [facility] for tasks assessed/performed       Past Medical History:  Diagnosis Date  . Arthritis    "knees" (05/16/2017)  . Chest pain   . Hyperlipidemia   . Hypertension   . PAC (premature atrial contraction)   . SVT (supraventricular tachycardia) (HCC)     Past Surgical History:  Procedure Laterality Date  . JOINT REPLACEMENT    . KNEE ARTHROSCOPY Left 1996  . KNEE SURGERY Right [phone]   "open surgery; because of the pain"  . 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 TOTAL KNEE ARTHROPLASTY Right 05/16/2017   Procedure: RIGHT TOTAL KNEE ARTHROPLASTY;  Surgeon: Leandrew Koyanagi, MD;  Location: [place];  Service: Orthopedics;  Laterality: Right;  . TOTAL KNEE ARTHROPLASTY Left 11/16/2018   Procedure: LEFT TOTAL KNEE ARTHROPLASTY;  Surgeon: Leandrew Koyanagi, MD;  Location: [place];  Service: Orthopedics;  Laterality: Left;    There were no vitals filed for this visit.  Subjective Assessment - 12/23/18 1112    Subjective  Patient reports she is having alittle bit of pain but overall it is doing much better.    Patient is accompained by:  Interpreter   [place] [phone]   Currently in Pain?  No/denies   just a little ache at times        [facility] PT Assessment - 12/23/18 0001      AROM   Left Knee Extension  -10    Left Knee  Flexion  97                   [facility] Adult PT Treatment/Exercise - 12/23/18 1611      Knee/Hip Exercises: Aerobic   Nustep  L4x5' for ROM      Knee/Hip Exercises: Standing   Heel Raises  Both;1 set;20 reps    Forward Step Up  2 sets;10 reps;Step Height: 4"    Functional Squat  10 reps;2 sets    Functional Squat Limitations  at counter     Other Standing Knee Exercises  standing march 2x10       Knee/Hip Exercises: Seated   Long Arc Quad  Strengthening;Left;3 sets;10 reps   5# , VC to work in full available ROM     Knee/Hip Exercises: Supine   Quad Sets  Strengthening;Left;10 reps   with roll under ankle   Short Arc Quad Sets  Strengthening;Left;3 sets;10 reps   wiht 5#     Manual Therapy   Manual therapy comments  patellar mobs , PROM L knee     Joint Mobilization  GR II -III flexion and extension mobilizations     Soft tissue mobilization  to posterior knee and quad  PT Education - 12/23/18 1113    Education Details  exercises and symptom management    Person(s) Educated  Patient    Methods  Explanation;Demonstration;Tactile cues;Verbal cues    Comprehension  Verbalized understanding;Returned demonstration;Verbal cues required;Tactile cues required       PT Short Term Goals - 12/23/18 1524      PT SHORT TERM GOAL #1   Title  Patient will increase left knee extension passive motion by 5 degrees    Time  4    Period  Weeks    Status  On-going    Target Date  12/29/18      PT SHORT TERM GOAL #2   Title  Patient will increase passive knee flexion by 10 degrees    Time  3    Period  Weeks    Status  On-going    Target Date  12/22/18      PT SHORT TERM GOAL #3   Title  Patient will demonstrate decreased mid patella edema by 2.5 cm    Time  3    Period  Weeks    Status  Achieved    Target Date  12/22/18      PT SHORT TERM GOAL #4   Title  Patient will increase knee extension strength and left hip flexion strength by 1 grade     Time  3    Period  Weeks    Status  On-going    Target Date  12/22/18        PT Long Term Goals - 12/16/18 1046      PT LONG TERM GOAL #1   Title  Patient will go up/down 4 steps without any pain with reciprocal pattern    Baseline  No steps in the house but does have 3 to get into the home    Period  Weeks    Status  On-going      PT LONG TERM GOAL #2   Title  Patient will bend to pick object up without pain    Status  On-going      PT LONG TERM GOAL #3   Title  Patient will ambualte 3000' in the community without a device or increased pain.    Status  On-going            Plan - 12/23/18 1518    Clinical Impression Statement  Patient tolerated treatment well. She reported improved pain with treatment. She required cuing for technique with ther-ex. Her knee extension is still limited. She was encouraged to scudelue more visits but she reports she will wait until she sees the MD. She was advised to continue stretching her extension in the mean time. Therapy worked on IT trainer today. She tolerated well.    Personal Factors and Comorbidities  Comorbidity 1    Comorbidities  right TKA    Examination-Activity Limitations  Squat;Lift;Stand;Transfers;Sit;Carry;Locomotion Level    Stability/Clinical Decision Making  Stable/Uncomplicated    Clinical Decision Making  Low    Rehab Potential  Good    PT Frequency  2x / week    PT Duration  6 weeks    PT Treatment/Interventions  ADLs/Self Care Home Management;Cryotherapy;Electrical Stimulation;Iontophoresis 4mg /ml Dexamethasone;Ultrasound;Gait training;Stair training;Functional mobility training;Therapeutic activities;Therapeutic exercise;Neuromuscular re-education;Patient/family education;Manual techniques;Passive range of motion;Taping;DME Instruction    PT Next Visit Plan  gait, knee ROM, strength and  proprioception,    PT Home Exercise Plan  knee AROM, likely has home health HEP, green band hamstring stretch and activation  Patient will benefit from skilled therapeutic intervention in order to improve the following deficits and impairments:  Abnormal gait, Difficulty walking, Decreased endurance, Decreased range of motion, Decreased activity tolerance, Decreased safety awareness, Pain, Increased fascial restricitons, Improper body mechanics, Impaired perceived functional ability  Visit Diagnosis: Stiffness of left knee, not elsewhere classified  Acute pain of left knee  Other abnormalities of gait and mobility  Localized edema     Problem List Patient Active Problem List   Diagnosis Date Noted  . 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 SVT (supraventricular tachycardia) ([place]) 07/09/2018  . Osteoporosis 05/25/2017  . Status post total right knee replacement 05/16/2017  . Dyslipidemia 01/12/2010  . Hypertension 01/12/2010    Carney Living PT DPT  12/23/2018, 4:13 PM  [facility] [address] [place], [place], [address] Phone: [phone]   Fax:  [phone]  Name: Ariel Harris MRN: [id number] Date of Birth: September 03, 1945

## 2018-12-29 ENCOUNTER — Encounter: Payer: Self-pay | Admitting: Orthopaedic Surgery

## 2018-12-29 ENCOUNTER — Other Ambulatory Visit: Payer: Self-pay

## 2018-12-29 ENCOUNTER — Ambulatory Visit (INDEPENDENT_AMBULATORY_CARE_PROVIDER_SITE_OTHER): Payer: Medicare Other | Admitting: Orthopaedic Surgery

## 2018-12-29 ENCOUNTER — Ambulatory Visit (INDEPENDENT_AMBULATORY_CARE_PROVIDER_SITE_OTHER): Payer: Medicare Other

## 2018-12-29 VITALS — Ht 63.0 in | Wt 155.0 lb

## 2018-12-29 DIAGNOSIS — Z96652 Presence of left artificial knee joint: Secondary | ICD-10-CM

## 2018-12-29 NOTE — Progress Notes (Signed)
   Post-Op Visit Note   Patient: Ariel Harris           Date of Birth: January 18, 1946           MRN: [id number] Visit Date: 12/29/2018 PCP: Antonietta Jewel, MD   Assessment & Plan:  Chief Complaint:  Chief Complaint  Patient presents with  . Left Knee - Follow-up    Left TKA DOS 11/16/2018   Visit Diagnoses:  1. S/P TKR (total knee replacement), left     Plan: Patient is 6 weeks status post left total knee replacement.  She is doing well has been doing outpatient PT.  She is not taking anything for pain.  Overall she is very happy and satisfied.  Surgical scar is fully healed.  Range of motion reveals 15 degree flexion contracture and 95 degrees of flexion.  Stable to varus valgus.  I think she would benefit from another couple weeks of PT for range of motion.  Her left knee is very close in terms of range of motion to her right knee.  She is very satisfied overall.  We will submit a request for 2 more weeks of PT.  Dental prophylaxis reinforced today.  Questions encouraged and answered.  Follow-up in 6 weeks.  Follow-Up Instructions: Return in about 6 weeks (around 02/09/2019).   Orders:  Orders Placed This Encounter  Procedures  . XR Knee 1-2 Views Left   No orders of the defined types were placed in this encounter.   Imaging: Xr Knee 1-2 Views Left  Result Date: 12/29/2018 Stable total knee replacement in good alignment.    PMFS History: Patient Active Problem List   Diagnosis Date Noted  . 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 SVT (supraventricular tachycardia) ([place]) 07/09/2018  . Osteoporosis 05/25/2017  . Status post total right knee replacement 05/16/2017  . Dyslipidemia 01/12/2010  . Hypertension 01/12/2010   Past Medical History:  Diagnosis Date  . Arthritis    "knees" (05/16/2017)  . Chest pain   . Hyperlipidemia   . Hypertension   . PAC (premature atrial contraction)   . SVT (supraventricular tachycardia) (HCC)     Family History   Problem Relation Age of Onset  . Coronary artery disease Neg Hx   . Heart attack Neg Hx   . Breast cancer Neg Hx     Past Surgical History:  Procedure Laterality Date  . JOINT REPLACEMENT    . KNEE ARTHROSCOPY Left 1996  . KNEE SURGERY Right [phone]   "open surgery; because of the pain"  . 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 TOTAL KNEE ARTHROPLASTY Right 05/16/2017   Procedure: RIGHT TOTAL KNEE ARTHROPLASTY;  Surgeon: Leandrew Koyanagi, MD;  Location: [place];  Service: Orthopedics;  Laterality: Right;  . TOTAL KNEE ARTHROPLASTY Left 11/16/2018   Procedure: LEFT TOTAL KNEE ARTHROPLASTY;  Surgeon: Leandrew Koyanagi, MD;  Location: [place];  Service: Orthopedics;  Laterality: Left;   Social History   Occupational History  . Not on file  Tobacco Use  . Smoking status: Never Smoker  . Smokeless tobacco: Never Used  Substance and Sexual Activity  . Alcohol use: No  . Drug use: No  . Sexual activity: Not Currently

## 2018-12-30 ENCOUNTER — Encounter: Payer: Self-pay | Admitting: Physical Therapy

## 2018-12-30 ENCOUNTER — Telehealth: Payer: Self-pay | Admitting: *Deleted

## 2018-12-30 ENCOUNTER — Ambulatory Visit: Payer: Medicare Other | Attending: Physician Assistant | Admitting: Physical Therapy

## 2018-12-30 DIAGNOSIS — R600 Localized edema: Secondary | ICD-10-CM | POA: Diagnosis present

## 2018-12-30 DIAGNOSIS — M25562 Pain in left knee: Secondary | ICD-10-CM | POA: Diagnosis present

## 2018-12-30 DIAGNOSIS — R2689 Other abnormalities of gait and mobility: Secondary | ICD-10-CM | POA: Insufficient documentation

## 2018-12-30 DIAGNOSIS — M25662 Stiffness of left knee, not elsewhere classified: Secondary | ICD-10-CM | POA: Insufficient documentation

## 2018-12-30 NOTE — Therapy (Signed)
[place], [place], [address] Phone: [phone]   Fax:  [phone]  Physical Therapy Treatment  Patient Details  Name: Ariel Harris MRN: [id number] Date of Birth: July 27, 1945 Referring Provider (PT): Tiney Rouge [facility]    Encounter Date: 12/30/2018  PT End of Session - 12/30/18 1047    Visit Number  8    Number of Visits  16    Date for PT Re-Evaluation  01/26/19    Authorization Type  Meicare / Medicaid    PT Start Time  1017    PT Stop Time  1100    PT Time Calculation (min)  43 min    Activity Tolerance  Patient tolerated treatment well    Behavior During Therapy  [facility] for tasks assessed/performed       Past Medical History:  Diagnosis Date  . Arthritis    "knees" (05/16/2017)  . Chest pain   . Hyperlipidemia   . Hypertension   . PAC (premature atrial contraction)   . SVT (supraventricular tachycardia) (HCC)     Past Surgical History:  Procedure Laterality Date  . JOINT REPLACEMENT    . KNEE ARTHROSCOPY Left 1996  . KNEE SURGERY Right [phone]   "open surgery; because of the pain"  . 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 TOTAL KNEE ARTHROPLASTY Right 05/16/2017   Procedure: RIGHT TOTAL KNEE ARTHROPLASTY;  Surgeon: Leandrew Koyanagi, MD;  Location: [place];  Service: Orthopedics;  Laterality: Right;  . TOTAL KNEE ARTHROPLASTY Left 11/16/2018   Procedure: LEFT TOTAL KNEE ARTHROPLASTY;  Surgeon: Leandrew Koyanagi, MD;  Location: [place];  Service: Orthopedics;  Laterality: Left;    There were no vitals filed for this visit.  Subjective Assessment - 12/30/18 1043    Subjective  Patient reports it has been feeling better. She has been working on her exercises at home. She has been working hard on getting it straight    Patient is accompained by:  Interpreter    Diagnostic tests  Nothing post-op    Patient Stated Goals  walk faster , less pain    Currently in Pain?  No/denies          [facility] PT Assessment - 12/30/18 0001      AROM   Left Knee Extension  -12    Left Knee Flexion  97                   [facility] Adult PT Treatment/Exercise - 12/30/18 0001      Knee/Hip Exercises: Stretches   Active Hamstring Stretch  2 reps;20 seconds    Active Hamstring Stretch Limitations  seated to left LE       Knee/Hip Exercises: Standing   Heel Raises  Both;1 set;20 reps    Forward Step Up  2 sets;10 reps;Step Height: 4"    Functional Squat  10 reps;2 sets    Functional Squat Limitations  at counter     Other Standing Knee Exercises  standing march 2x10       Knee/Hip Exercises: Seated   Long Arc Quad  Strengthening;Left;3 sets;10 reps   5# , VC to work in full available ROM     Knee/Hip Exercises: Supine   Quad Sets  Strengthening;Left;10 reps   with roll under ankle   Short Arc Quad Sets  Strengthening;Left;3 sets;10 reps   wiht 5#     Manual Therapy   Manual therapy comments  patellar mobs ,  PROM L knee     Joint Mobilization  GR II -III flexion and extension mobilizations     Soft tissue mobilization  to posterior knee and quad              PT Education - 12/30/18 1046    Education Details  HEP and symptom mangement    Person(s) Educated  Patient    Methods  Explanation;Demonstration;Verbal cues;Tactile cues    Comprehension  Verbalized understanding;Returned demonstration;Tactile cues required;Verbal cues required       PT Short Term Goals - 12/30/18 1549      PT SHORT TERM GOAL #1   Title  Patient will increase left knee extension passive motion by 5 degrees    Time  4    Period  Weeks    Status  On-going    Target Date  12/29/18      PT SHORT TERM GOAL #2   Title  Patient will increase passive knee flexion by 10 degrees    Time  3    Period  Weeks    Status  On-going    Target Date  12/22/18      PT SHORT TERM GOAL #3   Title  Patient will demonstrate decreased mid patella edema by 2.5 cm    Period  Weeks    Status   Achieved    Target Date  12/22/18      PT SHORT TERM GOAL #4   Title  Patient will increase knee extension strength and left hip flexion strength by 1 grade    Time  3    Period  Weeks    Status  On-going        PT Long Term Goals - 12/16/18 1046      PT LONG TERM GOAL #1   Title  Patient will go up/down 4 steps without any pain with reciprocal pattern    Baseline  No steps in the house but does have 3 to get into the home    Period  Weeks    Status  On-going      PT LONG TERM GOAL #2   Title  Patient will bend to pick object up without pain    Status  On-going      PT LONG TERM GOAL #3   Title  Patient will ambualte 3000' in the community without a device or increased pain.    Status  On-going            Plan - 12/30/18 1047    Clinical Impression Statement  Patient is maling progress. Her knee extension has improved since her last visit. She has been working on it at home. She tolerated ther-ex well. therapy will continue to progress as tolerated.    Comorbidities  right TKA    Examination-Activity Limitations  Squat;Lift;Stand;Transfers;Sit;Carry;Locomotion Level    Examination-Participation Restrictions  Meal Prep;Yard Work;Cleaning;Shop;Laundry    Stability/Clinical Decision Making  Stable/Uncomplicated    Clinical Decision Making  Low    Rehab Potential  Good    PT Frequency  2x / week    PT Duration  6 weeks    PT Treatment/Interventions  ADLs/Self Care Home Management;Cryotherapy;Electrical Stimulation;Iontophoresis 4mg /ml Dexamethasone;Ultrasound;Gait training;Stair training;Functional mobility training;Therapeutic activities;Therapeutic exercise;Neuromuscular re-education;Patient/family education;Manual techniques;Passive range of motion;Taping;DME Instruction    PT Next Visit Plan  gait, knee ROM, strength and  proprioception,    PT Home Exercise Plan  knee AROM, likely has home health HEP, green band hamstring stretch and activation  Consulted and Agree  with Plan of Care  Patient       Patient will benefit from skilled therapeutic intervention in order to improve the following deficits and impairments:  Abnormal gait, Difficulty walking, Decreased endurance, Decreased range of motion, Decreased activity tolerance, Decreased safety awareness, Pain, Increased fascial restricitons, Improper body mechanics, Impaired perceived functional ability  Visit Diagnosis: Stiffness of left knee, not elsewhere classified  Acute pain of left knee  Other abnormalities of gait and mobility  Localized edema     Problem List Patient Active Problem List   Diagnosis Date Noted  . 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 SVT (supraventricular tachycardia) ([place]) 07/09/2018  . Osteoporosis 05/25/2017  . Status post total right knee replacement 05/16/2017  . Dyslipidemia 01/12/2010  . Hypertension 01/12/2010     Carney Living PT DPT  12/30/2018, 8:24 PM  [place] [address] [place], [place], [address] Phone: [phone]   Fax:  [phone]  Name: Ariel Harris MRN: [id number] Date of Birth: 03-May-1945

## 2018-12-30 NOTE — Care Plan (Signed)
RNCM met with patient during office visit on 12/29/18 with Dr. Xu. She is 6 weeks s/p Left TKA. She verbalized she is doing very well. She has been attending OPPT with [facility] on [address] and states that she wanted to come to this appointment today before scheduling more visits. Therapist note in chart recommends additional visits as well. Dr. Xu is in agreement that patient could benefit for another 2 weeks of OPPT to help with range of motion, strengthening. RNCM contacted therapy office after patient left office and verbally updated on today's MD appointment. Requested therapy staff contact patient for scheduling additional therapy visits.F/U with Dr. Xu in 6 weeks. 

## 2018-12-30 NOTE — Telephone Encounter (Signed)
Ortho bundle in office visit note completed. S/P 6 weeks.

## 2019-01-06 ENCOUNTER — Ambulatory Visit: Payer: Medicare Other | Admitting: Physical Therapy

## 2019-01-06 ENCOUNTER — Encounter: Payer: Self-pay | Admitting: Physical Therapy

## 2019-01-06 ENCOUNTER — Other Ambulatory Visit: Payer: Self-pay

## 2019-01-06 DIAGNOSIS — R600 Localized edema: Secondary | ICD-10-CM

## 2019-01-06 DIAGNOSIS — M25562 Pain in left knee: Secondary | ICD-10-CM

## 2019-01-06 DIAGNOSIS — M25662 Stiffness of left knee, not elsewhere classified: Secondary | ICD-10-CM

## 2019-01-06 DIAGNOSIS — R2689 Other abnormalities of gait and mobility: Secondary | ICD-10-CM

## 2019-01-06 NOTE — Patient Instructions (Signed)
Hamstring Stretch, Reclined (Strap, Doorframe)    Lengthen bottom leg on floor. Extend top leg along edge of doorframe or press foot up into yoga strap. Hold for ___3_ breaths. Repeat ___3_ times each leg.  Copyright  VHI. All rights reserved.   Hip Flexion / Knee Extension: Straight-Leg Raise (Eccentric)    Lie on back. Lift leg with knee straight. Slowly lower leg for 3-5 seconds. __10_ reps per set, _3__ sets per day, __7_ days per week. Lower like elevator, stopping at each floor. Copyright  VHI. All rights reserved.  Quad Set    With other leg bent, foot flat, slowly tighten muscles on thigh of straight leg while counting out loud to _10___. Repeat with other leg. Repeat __10__ times. Do ___3_ sessions per day.  http://[email or web address].exer.us/276   Copyright  VHI. All rights reserved.

## 2019-01-06 NOTE — Therapy (Signed)
[place], [place], [address] Phone: [phone]   Fax:  [phone]  Physical Therapy Treatment  Patient Details  Name: Ariel Harris MRN: [id number] Date of Birth: 04/19/1945 Referring Provider (PT): Tiney Rouge [facility]    Encounter Date: 01/06/2019  PT End of Session - 01/06/19 0946    Visit Number  9    Number of Visits  16    Date for PT Re-Evaluation  01/26/19    Authorization Type  Meicare / Medicaid    PT Start Time  0915    PT Stop Time  1008    PT Time Calculation (min)  53 min    Activity Tolerance  Patient tolerated treatment well    Behavior During Therapy  [facility] for tasks assessed/performed       Past Medical History:  Diagnosis Date  . Arthritis    "knees" (05/16/2017)  . Chest pain   . Hyperlipidemia   . Hypertension   . PAC (premature atrial contraction)   . SVT (supraventricular tachycardia) (HCC)     Past Surgical History:  Procedure Laterality Date  . JOINT REPLACEMENT    . KNEE ARTHROSCOPY Left 1996  . KNEE SURGERY Right [phone]   "open surgery; because of the pain"  . 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 TOTAL KNEE ARTHROPLASTY Right 05/16/2017   Procedure: RIGHT TOTAL KNEE ARTHROPLASTY;  Surgeon: Leandrew Koyanagi, MD;  Location: [place];  Service: Orthopedics;  Laterality: Right;  . TOTAL KNEE ARTHROPLASTY Left 11/16/2018   Procedure: LEFT TOTAL KNEE ARTHROPLASTY;  Surgeon: Leandrew Koyanagi, MD;  Location: [place];  Service: Orthopedics;  Laterality: Left;    There were no vitals filed for this visit.  Subjective Assessment - 01/06/19 0924    Subjective  i have been working my L knee at home.  Maybe I did too much yesterday.    How long can you stand comfortably?  10 min    Currently in Pain?  Yes    Pain Score  3     Pain Location  Knee    Pain Orientation  Left    Pain Descriptors / Indicators  Tightness    Pain Type  Surgical pain    Pain Onset  More than a  month ago    Pain Frequency  Intermittent    Aggravating Factors   standing, walking, especially fast    Pain Relieving Factors  sitting, ice, med, laying down         [facility] PT Assessment - 01/06/19 0001      AROM   Left Knee Extension  -10    Left Knee Flexion  90      Strength   Left Knee Flexion  4-/5    Left Knee Extension  4/5         [facility] Adult PT Treatment/Exercise - 01/06/19 0001      Self-Care   Other Self-Care Comments   HEP , goals       Knee/Hip Exercises: Stretches   Active Hamstring Stretch  3 reps;20 seconds    Other Knee/Hip Stretches  prone knee extension with MHP and manual calf stretch x 5 min       Knee/Hip Exercises: Aerobic   Nustep  L5 Ue and LE for 6 min       Knee/Hip Exercises: Seated   Long Arc Quad  Strengthening;Left;2 sets;15 reps    Long Arc Quad Weight  3 lbs.  Hamstring Curl  Strengthening;Left;1 set;15 reps    Hamstring Limitations  green band       Knee/Hip Exercises: Supine   Quad Sets  Strengthening;Left;1 set;10 reps    Short Arc Quad Sets  Strengthening;Left;1 set;20 reps    Short Arc Quad Sets Limitations  3 lbs     Straight Leg Raises  Strengthening    Straight Leg Raises Limitations  cues needed       Knee/Hip Exercises: Prone   Hip Extension  Right;Both;1 set;10 reps    Prone Knee Hang  3 minutes    Prone Knee Hang Weights (lbs)  used MHP     Other Prone Exercises  quad set, unable to do properly       Cryotherapy   Number Minutes Cryotherapy  8 Minutes    Cryotherapy Location  Knee    Type of Cryotherapy  Ice pack      Manual Therapy   Manual therapy comments  patellar mobs , PROM L knee     Joint Mobilization  GR. III extension , Gr II flexion     Soft tissue mobilization  post thigh, calf              PT Education - 01/06/19 0946    Education Details  HEP, knee extension, ROM and strength , used interpreter    Person(s) Educated  Patient;Spouse    Methods  Explanation;Demonstration;Tactile  cues;Handout    Comprehension  Verbalized understanding;Verbal cues required;Returned demonstration       PT Short Term Goals - 01/06/19 0930      PT SHORT TERM GOAL #1   Title  Patient will increase left knee extension passive motion by 5 degrees    Status  On-going      PT SHORT TERM GOAL #2   Title  Patient will increase passive knee flexion by 10 degrees    Status  On-going      PT SHORT TERM GOAL #3   Title  Patient will demonstrate decreased mid patella edema by 2.5 cm    Status  Achieved      PT SHORT TERM GOAL #4   Title  Patient will increase knee extension strength and left hip flexion strength by 1 grade    Baseline  quads 4-/5, hamstring 4/5 , hip flexors 4/5        PT Long Term Goals - 01/06/19 1015      PT LONG TERM GOAL #1   Title  Patient will go up/down 4 steps without any pain with reciprocal pattern    Status  On-going      PT LONG TERM GOAL #2   Title  Patient will bend to pick object up without pain    Status  On-going      PT LONG TERM GOAL #3   Title  Patient will ambualte 3000' in the community without a device or increased pain.    Baseline  10 min    Status  On-going            Plan - 01/06/19 0933    Clinical Impression Statement  Patient continues to work hard in session, limited in quad strength, needed max cues as she compensates with glutes.  Trial of prone for stretch into extension.    PT Treatment/Interventions  ADLs/Self Care Home Management;Cryotherapy;Electrical Stimulation;Iontophoresis 4mg /ml Dexamethasone;Ultrasound;Gait training;Stair training;Functional mobility training;Therapeutic activities;Therapeutic exercise;Neuromuscular re-education;Patient/family education;Manual techniques;Passive range of motion;Taping;DME Instruction    PT Next Visit Plan  gait, knee  ROM, strength and  proprioception, QUADS. try closed chain wall weight shifts for more natural quad activation.    PT Home Exercise Plan  knee AROM, likely has home  health HEP, green band hamstring stretch and activation.  Quad sets, LAQ, hamstring    Consulted and Agree with Plan of Care  Patient       Patient will benefit from skilled therapeutic intervention in order to improve the following deficits and impairments:  Abnormal gait, Difficulty walking, Decreased endurance, Decreased range of motion, Decreased activity tolerance, Decreased safety awareness, Pain, Increased fascial restricitons, Improper body mechanics, Impaired perceived functional ability  Visit Diagnosis: Stiffness of left knee, not elsewhere classified  Acute pain of left knee  Localized edema  Other abnormalities of gait and mobility     Problem List Patient Active Problem List   Diagnosis Date Noted  . 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 SVT (supraventricular tachycardia) ([place]) 07/09/2018  . Osteoporosis 05/25/2017  . Status post total right knee replacement 05/16/2017  . Dyslipidemia 01/12/2010  . Hypertension 01/12/2010    PAA,JENNIFER 01/06/2019, 10:19 AM  [facility] [address] [place], [place], [address] Phone: [phone]   Fax:  [phone]  Name: Ariel Harris MRN: [id number] Date of Birth: 1945-09-27  Raeford Razor, PT 01/06/19 10:19 AM Phone: [phone] Fax: [phone]

## 2019-01-08 ENCOUNTER — Encounter: Payer: Self-pay | Admitting: Physical Therapy

## 2019-01-08 ENCOUNTER — Ambulatory Visit (INDEPENDENT_AMBULATORY_CARE_PROVIDER_SITE_OTHER): Payer: Medicare Other | Admitting: Registered Nurse

## 2019-01-08 ENCOUNTER — Ambulatory Visit: Payer: Medicare Other | Admitting: Registered Nurse

## 2019-01-08 ENCOUNTER — Encounter: Payer: Self-pay | Admitting: Registered Nurse

## 2019-01-08 ENCOUNTER — Ambulatory Visit: Payer: Medicare Other | Admitting: Physical Therapy

## 2019-01-08 ENCOUNTER — Other Ambulatory Visit: Payer: Self-pay

## 2019-01-08 VITALS — BP 138/80 | HR 88 | Temp 98.5°F | Resp 12 | Ht 63.0 in | Wt 148.0 lb

## 2019-01-08 DIAGNOSIS — R600 Localized edema: Secondary | ICD-10-CM

## 2019-01-08 DIAGNOSIS — L308 Other specified dermatitis: Secondary | ICD-10-CM | POA: Diagnosis not present

## 2019-01-08 DIAGNOSIS — R2689 Other abnormalities of gait and mobility: Secondary | ICD-10-CM

## 2019-01-08 DIAGNOSIS — M25662 Stiffness of left knee, not elsewhere classified: Secondary | ICD-10-CM

## 2019-01-08 DIAGNOSIS — M25562 Pain in left knee: Secondary | ICD-10-CM

## 2019-01-08 DIAGNOSIS — E785 Hyperlipidemia, unspecified: Secondary | ICD-10-CM | POA: Diagnosis not present

## 2019-01-08 DIAGNOSIS — I10 Essential (primary) hypertension: Secondary | ICD-10-CM | POA: Diagnosis not present

## 2019-01-08 MED ORDER — PRAVASTATIN SODIUM 40 MG PO TABS: 40.0000 mg | ORAL_TABLET | Freq: Every day | ORAL | 3 refills | Status: DC

## 2019-01-08 MED ORDER — BETAMETHASONE DIPROPIONATE 0.05 % EX CREA: TOPICAL_CREAM | Freq: Two times a day (BID) | CUTANEOUS | 0 refills | Status: DC

## 2019-01-08 MED ORDER — METOPROLOL SUCCINATE ER 25 MG PO TB24: ORAL_TABLET | ORAL | 1 refills | Status: DC

## 2019-01-08 NOTE — Progress Notes (Signed)
Established Patient Office Visit  Subjective:  Patient ID: Ariel Harris, female    DOB: 1945-11-12  Age: 73 y.o. MRN: [id number]  CC:  Chief Complaint  Patient presents with  . Hypertension    Pt stated--sometimes blood pressure is elevated.    HPI Ariel Harris presents for 6 mo follow up for HTN and HLD control  Feels well overall. No complaints CV. BP wnl today.  Otherwise, notes rash on leg - itchy, hyperpigmented. Not elevated and no purulence. Not spreading. Started around 2 weeks ago.   Past Medical History:  Diagnosis Date  . Arthritis    "knees" (05/16/2017)  . Chest pain   . Hyperlipidemia   . Hypertension   . PAC (premature atrial contraction)   . SVT (supraventricular tachycardia) (HCC)     Past Surgical History:  Procedure Laterality Date  . JOINT REPLACEMENT    . KNEE ARTHROSCOPY Left 1996  . KNEE SURGERY Right [phone]   "open surgery; because of the pain"  . 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 TOTAL KNEE ARTHROPLASTY Right 05/16/2017   Procedure: RIGHT TOTAL KNEE ARTHROPLASTY;  Surgeon: Leandrew Koyanagi, MD;  Location: [place];  Service: Orthopedics;  Laterality: Right;  . TOTAL KNEE ARTHROPLASTY Left 11/16/2018   Procedure: LEFT TOTAL KNEE ARTHROPLASTY;  Surgeon: Leandrew Koyanagi, MD;  Location: [place];  Service: Orthopedics;  Laterality: Left;    Family History  Problem Relation Age of Onset  . Coronary artery disease Neg Hx   . Heart attack Neg Hx   . Breast cancer Neg Hx     Social History   Socioeconomic History  . Marital status: Divorced    Spouse name: Not on file  . Number of children: 2  . Years of education: Not on file  . Highest education level: Not on file  Occupational History  . Not on file  Tobacco Use  . Smoking status: Never Smoker  . Smokeless tobacco: Never Used  Substance and Sexual Activity  . Alcohol use: No  . Drug use: No  . Sexual activity: Not Currently  Other Topics Concern  . Not on file  Social  History Narrative   Negative family `history--Specifically artery disease or MI. The patient has two siblings died at a young age from fevers.   The patient is separated and single. She is retired but used to work at a [employer] and a [employer]. She is independent of daily living. She has never drink or smoke or done drugs   Social Determinants of Health   Financial Resource Strain:   . Difficulty of Paying Living Expenses: Not on file  Food Insecurity:   . Worried About Charity fundraiser in the Last Year: Not on file  . Ran Out of Food in the Last Year: Not on file  Transportation Needs:   . Lack of Transportation (Medical): Not on file  . Lack of Transportation (Non-Medical): Not on file  Physical Activity:   . Days of Exercise per Week: Not on file  . Minutes of Exercise per Session: Not on file  Stress:   . Feeling of Stress : Not on file  Social Connections:   . Frequency of Communication with Friends and Family: Not on file  . Frequency of Social Gatherings with Friends and Family: Not on file  . Attends Religious Services: Not on file  . Active Member of Clubs or Organizations: Not on file  . Attends Club or  Organization Meetings: Not on file  . Marital Status: Not on file  Intimate Partner Violence:   . Fear of Current or Ex-Partner: Not on file  . Emotionally Abused: Not on file  . Physically Abused: Not on file  . Sexually Abused: Not on file    Outpatient Medications Prior to Visit  Medication Sig Dispense Refill  . aspirin EC 81 MG tablet Take 1 tablet (81 mg total) by mouth 2 (two) times daily. 84 tablet 0  . calcium-vitamin D (OSCAL WITH D) 500-200 MG-UNIT tablet Take 1 tablet by mouth daily with breakfast.    . metoprolol succinate (TOPROL XL) 25 MG 24 hr tablet Take 1 tablet by mouth daily, may take 1 extra tablet by mouth daily only as needed for palpitations (Patient taking differently: Take 25 mg by mouth daily. may take 1 extra tablet by mouth daily only as  needed for palpitations) 150 tablet 1  . pravastatin (PRAVACHOL) 40 MG tablet Take 1 tablet (40 mg total) by mouth daily. (Patient taking differently: Take 40 mg by mouth at bedtime. ) 90 tablet 3  . methocarbamol (ROBAXIN) 750 MG tablet Take 1 tablet (750 mg total) by mouth 2 (two) times daily as needed for muscle spasms. (Patient not taking: Reported on 01/08/2019) 60 tablet 0  . ondansetron (ZOFRAN) 4 MG tablet Take 1-2 tablets (4-8 mg total) by mouth every 8 (eight) hours as needed for nausea or vomiting. (Patient not taking: Reported on 01/08/2019) 40 tablet 0  . oxyCODONE (OXY IR/ROXICODONE) 5 MG immediate release tablet Take 1-2 tablets (5-10 mg total) by mouth every 8 (eight) hours as needed for severe pain. (Patient not taking: Reported on 01/08/2019) 30 tablet 0  . senna-docusate (SENOKOT S) 8.6-50 MG tablet Take 1-2 tablets by mouth at bedtime as needed. (Patient not taking: Reported on 01/08/2019) 30 tablet 1   No facility-administered medications prior to visit.    Allergies  Allergen Reactions  . Indomethacin Swelling  . Tramadol     Sweaty, made feel bad    ROS Review of Systems  Constitutional: Negative.   HENT: Negative.   Eyes: Negative.   Respiratory: Negative.   Cardiovascular: Negative.   Gastrointestinal: Negative.   Endocrine: Negative.   Genitourinary: Negative.   Musculoskeletal: Negative.   Skin: Positive for rash.  Allergic/Immunologic: Negative.   Neurological: Negative.   Hematological: Negative.   Psychiatric/Behavioral: Negative.   All other systems reviewed and are negative.     Objective:    Physical Exam  Constitutional: She is oriented to person, place, and time. She appears well-nourished. No distress.  Cardiovascular: Normal rate and regular rhythm.  Pulmonary/Chest: Effort normal. No respiratory distress.  Neurological: She is alert and oriented to person, place, and time.  Skin: Skin is warm. No rash noted. She is not diaphoretic. No  erythema. No pallor.     Psychiatric: She has a normal mood and affect. Her behavior is normal. Judgment and thought content normal.  Nursing note and vitals reviewed.   BP 138/80 (BP Location: Right Arm, Patient Position: Sitting, Cuff Size: Normal)   Pulse 88   Temp 98.5 F (36.9 C)   Resp 12   Ht 5\' 3"  (1.6 m)   Wt 148 lb (67.1 kg)   SpO2 96%   BMI 26.22 kg/m  Wt Readings from Last 3 Encounters:  01/08/19 148 lb (67.1 kg)  12/29/18 155 lb (70.3 kg)  12/02/18 155 lb (70.3 kg)     Health Maintenance Due  Topic Date Due  . Hepatitis C Screening  [birth year]    There are no preventive care reminders to display for this patient.  Lab Results  Component Value Date   TSH 0.65 12/09/2012   Lab Results  Component Value Date   WBC 7.1 11/17/2018   HGB 12.7 11/17/2018   HCT 40.1 11/17/2018   MCV 91.8 11/17/2018   PLT 165 11/17/2018   Lab Results  Component Value Date   NA 138 11/17/2018   K 4.6 11/17/2018   CO2 24 11/17/2018   GLUCOSE 127 (H) 11/17/2018   BUN 16 11/17/2018   CREATININE 0.83 11/17/2018   BILITOT 0.9 11/11/2018   ALKPHOS 36 (L) 11/11/2018   AST 28 11/11/2018   ALT 21 11/11/2018   PROT 7.6 11/11/2018   ALBUMIN 3.8 11/11/2018   CALCIUM 8.2 (L) 11/17/2018   ANIONGAP 6 11/17/2018   GFR 71.18 12/09/2012   Lab Results  Component Value Date   CHOL 190 07/10/2018   Lab Results  Component Value Date   HDL 66 07/10/2018   Lab Results  Component Value Date   LDLCALC 111 (H) 07/10/2018   Lab Results  Component Value Date   TRIG 67 07/10/2018   Lab Results  Component Value Date   CHOLHDL 2.9 07/10/2018   Lab Results  Component Value Date   HGBA1C (H) 11/23/2009    6.0 (NOTE)                                                                       According to the ADA Clinical Practice Recommendations for 2011, when HbA1c is used as a screening test:   >=6.5%   Diagnostic of Diabetes Mellitus           (if abnormal result  is confirmed)   5.7-6.4%   Increased risk of developing Diabetes Mellitus  References:Diagnosis and Classification of Diabetes Mellitus,Diabetes [id number] 1):S62-S69 and Standards of Medical Care in         Diabetes - 2011,Diabetes Care,2011,34  (Suppl 1):S11-S61.      Assessment & Plan:   Problem List Items Addressed This Visit      Cardiovascular and Mediastinum   Hypertension - Primary   Relevant Orders   Comprehensive metabolic panel   CBC     Other   Dyslipidemia   Relevant Orders   Lipid Panel      No orders of the defined types were placed in this encounter.   Follow-up: No follow-ups on file.   PLAN  Refill metoprolol and pravastatin.   Labs drawn, will follow up as warranted  Return in 6 mos for CPE and labs  Betamethasone for eczema relief  Patient encouraged to call clinic with any questions, comments, or concerns.  Maximiano Coss, NP

## 2019-01-08 NOTE — Therapy (Signed)
[place], [place], [address] Phone: [phone]   Fax:  [phone]  Physical Therapy Treatment  Patient Details  Name: Ariel Harris MRN: [id number] Date of Birth: Aug 02, 1945 Referring Provider (PT): Tiney Rouge [facility]   Progress Note Reporting Period 12/01/2018 to 01/08/2019  See note below for Objective Data and Assessment of Progress/Goals.       Encounter Date: 01/08/2019  PT End of Session - 01/08/19 1113    Visit Number  10    Number of Visits  16    Date for PT Re-Evaluation  01/26/19    Authorization Type  Meicare / Medicaid    PT Start Time  1105    PT Stop Time  1145    PT Time Calculation (min)  40 min    Activity Tolerance  Patient tolerated treatment well    Behavior During Therapy  WFL for tasks assessed/performed       Past Medical History:  Diagnosis Date  . Arthritis    "knees" (05/16/2017)  . Chest pain   . Hyperlipidemia   . Hypertension   . PAC (premature atrial contraction)   . SVT (supraventricular tachycardia) (HCC)     Past Surgical History:  Procedure Laterality Date  . JOINT REPLACEMENT    . KNEE ARTHROSCOPY Left 1996  . KNEE SURGERY Right [phone]   "open surgery; because of the pain"  . 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 TOTAL KNEE ARTHROPLASTY Right 05/16/2017   Procedure: RIGHT TOTAL KNEE ARTHROPLASTY;  Surgeon: Leandrew Koyanagi, MD;  Location: [place];  Service: Orthopedics;  Laterality: Right;  . TOTAL KNEE ARTHROPLASTY Left 11/16/2018   Procedure: LEFT TOTAL KNEE ARTHROPLASTY;  Surgeon: Leandrew Koyanagi, MD;  Location: [place];  Service: Orthopedics;  Laterality: Left;    There were no vitals filed for this visit.  Subjective Assessment - 01/08/19 1112    Subjective  Patient reports her knee is feeling better. she isjust having a little pain at this time.    Patient is accompained by:  Interpreter    Pertinent History  Linna Hoff [phone] vietnameese     How long can you stand comfortably?  10 min    Diagnostic tests  Nothing post-op    Patient Stated Goals  walk faster , less pain    Currently in Pain?  Yes    Pain Score  2     Pain Location  Knee    Pain Orientation  Left    Pain Descriptors / Indicators  Aching    Pain Type  Chronic pain    Pain Onset  More than a month ago    Pain Frequency  Intermittent    Aggravating Factors   standing, walking    Pain Relieving Factors  sitting, ice, and meds         [facility] PT Assessment - 01/08/19 0001      AROM   Left Knee Extension  --    Left Knee Flexion  --   mild pain at end range      PROM   Left Knee Extension  -6    Left Knee Flexion  97      Strength   Left Hip Flexion  4/5    Left Knee Flexion  4-/5    Left Knee Extension  4/5      Palpation   Patella mobility  improved patella mobility  [facility] Adult PT Treatment/Exercise - 01/08/19 0001      High Level Balance   High Level Balance Comments  air-ex narrow base 2x30 sec; narrow base air-ex eyes closed 2x30 sec hold sba       Knee/Hip Exercises: Stretches   Active Hamstring Stretch  3 reps;20 seconds      Knee/Hip Exercises: Standing   Heel Raises  Both;1 set;20 reps    Forward Step Up  2 sets;10 reps;Step Height: 4"    Functional Squat  10 reps;2 sets    Other Standing Knee Exercises  standing march 2x10       Knee/Hip Exercises: Seated   Long Arc Quad  Strengthening;Left;2 sets;15 reps    Long Arc Quad Weight  3 lbs.    Hamstring Curl  Strengthening;Left;1 set;15 reps    Hamstring Limitations  green band       Knee/Hip Exercises: Supine   Quad Sets  Strengthening;Left;1 set;10 reps    Short Arc Quad Sets  Strengthening;Left;1 set;20 reps    Short Arc Quad Sets Limitations  3lb     Straight Leg Raises  Strengthening    Straight Leg Raises Limitations  continued cuing       Manual Therapy   Manual therapy comments  patellar mobs , PROM L knee     Joint Mobilization  GR.  III extension , Gr II flexion     Soft tissue mobilization  post thigh, calf              PT Education - 01/08/19 1113    Education Details  HEP, symptom mangement    Person(s) Educated  Patient    Methods  Explanation;Tactile cues;Demonstration;Verbal cues    Comprehension  Verbalized understanding;Returned demonstration;Verbal cues required;Tactile cues required       PT Short Term Goals - 01/06/19 0930      PT SHORT TERM GOAL #1   Title  Patient will increase left knee extension passive motion by 5 degrees    Status  On-going      PT SHORT TERM GOAL #2   Title  Patient will increase passive knee flexion by 10 degrees    Status  On-going      PT SHORT TERM GOAL #3   Title  Patient will demonstrate decreased mid patella edema by 2.5 cm    Status  Achieved      PT SHORT TERM GOAL #4   Title  Patient will increase knee extension strength and left hip flexion strength by 1 grade    Baseline  quads 4-/5, hamstring 4/5 , hip flexors 4/5        PT Long Term Goals - 01/06/19 1015      PT LONG TERM GOAL #1   Title  Patient will go up/down 4 steps without any pain with reciprocal pattern    Status  On-going      PT LONG TERM GOAL #2   Title  Patient will bend to pick object up without pain    Status  On-going      PT LONG TERM GOAL #3   Title  Patient will ambualte 3000' in the community without a device or increased pain.    Baseline  10 min    Status  On-going            Plan - 01/08/19 1114    Clinical Impression Statement  Patient continues to make progress. Her pain is improving. Her knee extension on the right  at this point is better then the left. Passive knee extension at -6 after stretching. Flexion was measured at 97 degrees Her strength has improved although she still has deficits in knee extension (4/5) and hip flexion (4/5) . She has 1 more week of therapy scheduled. She will likley D/C to HEP at that time.    Personal Factors and Comorbidities   Comorbidity 1    Comorbidities  right TKA    Examination-Activity Limitations  Squat;Lift;Stand;Transfers;Sit;Carry;Locomotion Level    Stability/Clinical Decision Making  Stable/Uncomplicated    Clinical Decision Making  Low    Rehab Potential  Good    PT Frequency  2x / week    PT Duration  6 weeks    PT Treatment/Interventions  ADLs/Self Care Home Management;Cryotherapy;Electrical Stimulation;Iontophoresis 4mg /ml Dexamethasone;Ultrasound;Gait training;Stair training;Functional mobility training;Therapeutic activities;Therapeutic exercise;Neuromuscular re-education;Patient/family education;Manual techniques;Passive range of motion;Taping;DME Instruction    PT Next Visit Plan  gait, knee ROM, strength and  proprioception, QUADS. try closed chain wall weight shifts for more natural quad activation.    PT Home Exercise Plan  knee AROM, likely has home health HEP, green band hamstring stretch and activation.  Quad sets, LAQ, hamstring    Consulted and Agree with Plan of Care  Patient       Patient will benefit from skilled therapeutic intervention in order to improve the following deficits and impairments:  Abnormal gait, Difficulty walking, Decreased endurance, Decreased range of motion, Decreased activity tolerance, Decreased safety awareness, Pain, Increased fascial restricitons, Improper body mechanics, Impaired perceived functional ability  Visit Diagnosis: Stiffness of left knee, not elsewhere classified  Acute pain of left knee  Localized edema  Other abnormalities of gait and mobility     Problem List Patient Active Problem List   Diagnosis Date Noted  . 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 SVT (supraventricular tachycardia) ([place]) 07/09/2018  . Osteoporosis 05/25/2017  . Status post total right knee replacement 05/16/2017  . Dyslipidemia 01/12/2010  . Hypertension 01/12/2010    Carney Living PT DPT  01/08/2019, 12:19 PM  [facility] [address] [place], [place], [address] Phone: [phone]   Fax:  [phone]  Name: Ariel Harris MRN: [id number] Date of Birth: [birth year]

## 2019-01-09 LAB — CBC
Hematocrit: 42 % (ref 34.0–46.6)
Hemoglobin: 13.7 g/dL (ref 11.1–15.9)
MCH: 28.7 pg (ref 26.6–33.0)
MCHC: 32.6 g/dL (ref 31.5–35.7)
MCV: 88 fL (ref 79–97)
Platelets: 235 10*3/uL (ref 150–450)
RBC: 4.77 x10E6/uL (ref 3.77–5.28)
RDW: 12.5 % (ref 11.7–15.4)
WBC: 3.5 10*3/uL (ref 3.4–10.8)

## 2019-01-09 LAB — COMPREHENSIVE METABOLIC PANEL
ALT: 9 IU/L (ref 0–32)
AST: 19 IU/L (ref 0–40)
Albumin/Globulin Ratio: 1.1 — ABNORMAL LOW (ref 1.2–2.2)
Albumin: 4 g/dL (ref 3.7–4.7)
Alkaline Phosphatase: 48 IU/L (ref 39–117)
BUN/Creatinine Ratio: 15 (ref 12–28)
BUN: 13 mg/dL (ref 8–27)
Bilirubin Total: 0.8 mg/dL (ref 0.0–1.2)
CO2: 23 mmol/L (ref 20–29)
Calcium: 9.4 mg/dL (ref 8.7–10.3)
Chloride: 101 mmol/L (ref 96–106)
Creatinine, Ser: 0.85 mg/dL (ref 0.57–1.00)
GFR calc Af Amer: 80 mL/min/{1.73_m2} (ref >59)
GFR calc non Af Amer: 69 mL/min/{1.73_m2} (ref >59)
Globulin, Total: 3.6 g/dL (ref 1.5–4.5)
Glucose: 107 mg/dL — ABNORMAL HIGH (ref 65–99)
Potassium: 3.8 mmol/L (ref 3.5–5.2)
Sodium: 138 mmol/L (ref 134–144)
Total Protein: 7.6 g/dL (ref 6.0–8.5)

## 2019-01-09 LAB — LIPID PANEL
Chol/HDL Ratio: 2.9 ratio (ref 0.0–4.4)
Cholesterol, Total: 182 mg/dL (ref 100–199)
HDL: 62 mg/dL (ref >39)
LDL Chol Calc (NIH): 108 mg/dL — ABNORMAL HIGH (ref 0–99)
Triglycerides: 65 mg/dL (ref 0–149)
VLDL Cholesterol Cal: 12 mg/dL (ref 5–40)

## 2019-01-11 ENCOUNTER — Encounter: Payer: Self-pay | Admitting: Registered Nurse

## 2019-01-12 ENCOUNTER — Encounter: Payer: Self-pay | Admitting: Physical Therapy

## 2019-01-12 ENCOUNTER — Other Ambulatory Visit: Payer: Self-pay

## 2019-01-12 ENCOUNTER — Ambulatory Visit: Payer: Medicare Other | Admitting: Physical Therapy

## 2019-01-12 DIAGNOSIS — M25562 Pain in left knee: Secondary | ICD-10-CM

## 2019-01-12 DIAGNOSIS — M25662 Stiffness of left knee, not elsewhere classified: Secondary | ICD-10-CM

## 2019-01-12 DIAGNOSIS — R600 Localized edema: Secondary | ICD-10-CM

## 2019-01-12 DIAGNOSIS — R2689 Other abnormalities of gait and mobility: Secondary | ICD-10-CM

## 2019-01-13 NOTE — Therapy (Signed)
[place], [place], [address] Phone: [phone]   Fax:  [phone]  Physical Therapy Treatment  Patient Details  Name: Ariel Harris MRN: [id number] Date of Birth: [birth year] Referring Provider (PT): Tiney Rouge [facility]    Encounter Date: 01/12/2019  PT End of Session - 01/12/19 1026    Visit Number  11    Number of Visits  16    Date for PT Re-Evaluation  01/26/19    Authorization Type  Meicare / Medicaid    PT Start Time  1016    PT Stop Time  1058    PT Time Calculation (min)  42 min    Activity Tolerance  Patient tolerated treatment well    Behavior During Therapy  [facility] for tasks assessed/performed       Past Medical History:  Diagnosis Date  . Arthritis    "knees" (05/16/2017)  . Chest pain   . Hyperlipidemia   . Hypertension   . PAC (premature atrial contraction)   . SVT (supraventricular tachycardia) (HCC)     Past Surgical History:  Procedure Laterality Date  . JOINT REPLACEMENT    . KNEE ARTHROSCOPY Left 1996  . KNEE SURGERY Right [phone]   "open surgery; because of the pain"  . 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 TOTAL KNEE ARTHROPLASTY Right 05/16/2017   Procedure: RIGHT TOTAL KNEE ARTHROPLASTY;  Surgeon: Leandrew Koyanagi, MD;  Location: [place];  Service: Orthopedics;  Laterality: Right;  . TOTAL KNEE ARTHROPLASTY Left 11/16/2018   Procedure: LEFT TOTAL KNEE ARTHROPLASTY;  Surgeon: Leandrew Koyanagi, MD;  Location: [place];  Service: Orthopedics;  Laterality: Left;    There were no vitals filed for this visit.  Subjective Assessment - 01/12/19 1024    Subjective  Patient reports her knee is feeling much  better  compared to last week    Patient is accompained by:  Interpreter    Pertinent History  Reece Packer [id number]    How long can you stand comfortably?  10 min    Diagnostic tests  Nothing post-op    Currently in Pain?  No/denies                        [facility] Adult PT Treatment/Exercise - 01/13/19 0001      Knee/Hip Exercises: Stretches   Active Hamstring Stretch  3 reps;20 seconds      Knee/Hip Exercises: Standing   Heel Raises  Both;1 set;20 reps    Forward Step Up  2 sets;10 reps;Step Height: 4"    Functional Squat  10 reps;2 sets    Other Standing Knee Exercises  standing march 2x10       Knee/Hip Exercises: Seated   Long Arc Quad  Strengthening;Left;2 sets;15 reps    Long Arc Quad Weight  3 lbs.    Hamstring Curl  Strengthening;Left;1 set;15 reps    Hamstring Limitations  green band       Knee/Hip Exercises: Supine   Short Arc Quad Sets  Strengthening;Left;1 set;20 reps    Short Arc Quad Sets Limitations  3lb     Straight Leg Raises  Strengthening    Straight Leg Raises Limitations  no cuing      Manual Therapy   Manual therapy comments  patellar mobs , PROM L knee     Joint Mobilization  GR. III extension , Gr II flexion     Soft tissue mobilization  post thigh, calf              PT Education - 01/12/19 1025    Education Details  reviewed self stretching    Person(s) Educated  Patient    Methods  Explanation;Tactile cues;Demonstration;Verbal cues    Comprehension  Verbalized understanding;Returned demonstration;Verbal cues required;Tactile cues required       PT Short Term Goals - 01/06/19 0930      PT SHORT TERM GOAL #1   Title  Patient will increase left knee extension passive motion by 5 degrees    Status  On-going      PT SHORT TERM GOAL #2   Title  Patient will increase passive knee flexion by 10 degrees    Status  On-going      PT SHORT TERM GOAL #3   Title  Patient will demonstrate decreased mid patella edema by 2.5 cm    Status  Achieved      PT SHORT TERM GOAL #4   Title  Patient will increase knee extension strength and left hip flexion strength by 1 grade    Baseline  quads 4-/5, hamstring 4/5 , hip flexors 4/5        PT Long Term Goals - 01/06/19  1015      PT LONG TERM GOAL #1   Title  Patient will go up/down 4 steps without any pain with reciprocal pattern    Status  On-going      PT LONG TERM GOAL #2   Title  Patient will bend to pick object up without pain    Status  On-going      PT LONG TERM GOAL #3   Title  Patient will ambualte 3000' in the community without a device or increased pain.    Baseline  10 min    Status  On-going            Plan - 01/12/19 1045    Clinical Impression Statement  Patient continues to make good progress. Her knee total arc was measured at -8-103. She was encouraged to continue stretching at home. She is doing well with ther-ex. She will likely D/C next visit. She reported no increase in pain with treatment this visit.    Personal Factors and Comorbidities  Comorbidity 1    Comorbidities  right TKA    Examination-Activity Limitations  Squat;Lift;Stand;Transfers;Sit;Carry;Locomotion Level    Stability/Clinical Decision Making  Stable/Uncomplicated    Rehab Potential  Good    PT Treatment/Interventions  ADLs/Self Care Home Management;Cryotherapy;Electrical Stimulation;Iontophoresis 4mg /ml Dexamethasone;Ultrasound;Gait training;Stair training;Functional mobility training;Therapeutic activities;Therapeutic exercise;Neuromuscular re-education;Patient/family education;Manual techniques;Passive range of motion;Taping;DME Instruction    PT Next Visit Plan  asses for D/C home to HEP next visit    PT Home Exercise Plan  knee AROM, likely has home health HEP, green band hamstring stretch and activation.  Quad sets, LAQ, hamstring    Consulted and Agree with Plan of Care  Patient       Patient will benefit from skilled therapeutic intervention in order to improve the following deficits and impairments:  Abnormal gait, Difficulty walking, Decreased endurance, Decreased range of motion, Decreased activity tolerance, Decreased safety awareness, Pain, Increased fascial restricitons, Improper body mechanics,  Impaired perceived functional ability  Visit Diagnosis: Stiffness of left knee, not elsewhere classified  Acute pain of left knee  Localized edema  Other abnormalities of gait and mobility     Problem List Patient Active Problem List   Diagnosis Date Noted  . Status post total  left knee replacement 11/16/2018  . SVT (supraventricular tachycardia) ([place]) 07/09/2018  . Osteoporosis 05/25/2017  . Status post total right knee replacement 05/16/2017  . Dyslipidemia 01/12/2010  . Hypertension 01/12/2010    Carney Living PT DPT  01/13/2019, 10:07 AM  [facility] [address] [place], [place], [address] Phone: [phone]   Fax:  [phone]  Name: Ariel Harris MRN: [id number] Date of Birth: [birth year]

## 2019-01-14 ENCOUNTER — Encounter: Payer: Self-pay | Admitting: Physical Therapy

## 2019-01-14 ENCOUNTER — Other Ambulatory Visit: Payer: Self-pay

## 2019-01-14 ENCOUNTER — Ambulatory Visit: Payer: Medicare Other | Admitting: Physical Therapy

## 2019-01-14 DIAGNOSIS — R2689 Other abnormalities of gait and mobility: Secondary | ICD-10-CM

## 2019-01-14 DIAGNOSIS — R600 Localized edema: Secondary | ICD-10-CM

## 2019-01-14 DIAGNOSIS — M25662 Stiffness of left knee, not elsewhere classified: Secondary | ICD-10-CM

## 2019-01-14 DIAGNOSIS — M25562 Pain in left knee: Secondary | ICD-10-CM

## 2019-01-14 NOTE — Therapy (Signed)
[place], [place], [address] Phone: [phone]   Fax:  [phone]  Physical Therapy Treatment/Discharge   Patient Details  Name: Ariel Harris MRN: [id number] Date of Birth: 1945/02/20 Referring Provider (PT): Tiney Rouge [facility]    Encounter Date: 01/14/2019  PT End of Session - 01/14/19 1001    Visit Number  12    Number of Visits  16    Date for PT Re-Evaluation  01/26/19    Authorization Type  Meicare / Medicaid    PT Start Time  0932    PT Stop Time  1013    PT Time Calculation (min)  41 min    Activity Tolerance  Patient tolerated treatment well    Behavior During Therapy  [facility] for tasks assessed/performed       Past Medical History:  Diagnosis Date  . Arthritis    "knees" (05/16/2017)  . Chest pain   . Hyperlipidemia   . Hypertension   . PAC (premature atrial contraction)   . SVT (supraventricular tachycardia) (HCC)     Past Surgical History:  Procedure Laterality Date  . JOINT REPLACEMENT    . KNEE ARTHROSCOPY Left 1996  . KNEE SURGERY Right [phone]   "open surgery; because of the pain"  . 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 TOTAL KNEE ARTHROPLASTY Right 05/16/2017   Procedure: RIGHT TOTAL KNEE ARTHROPLASTY;  Surgeon: Leandrew Koyanagi, MD;  Location: [place];  Service: Orthopedics;  Laterality: Right;  . TOTAL KNEE ARTHROPLASTY Left 11/16/2018   Procedure: LEFT TOTAL KNEE ARTHROPLASTY;  Surgeon: Leandrew Koyanagi, MD;  Location: [place];  Service: Orthopedics;  Laterality: Left;    There were no vitals filed for this visit.  Subjective Assessment - 01/14/19 0941    Subjective  Patient has no complaints. She reports it has felt good.    Patient is accompained by:  Interpreter    Pertinent History  Flo Shanks [id number]    How long can you stand comfortably?  10 min    Diagnostic tests  Nothing post-op    Patient Stated Goals  walk faster , less pain    Currently in Pain?   No/denies         [facility] PT Assessment - 01/14/19 0944      PROM   Left Knee Extension  -8    Left Knee Flexion  95      Strength   Left Hip Flexion  4+/5    Left Knee Flexion  4+/5    Left Knee Extension  4+/5      Palpation   Patella mobility  improved patella mobility       Ambulation/Gait   Gait Comments  ambualting with slight knee flexion bilateral but stable and pain free.                    [facility] Adult PT Treatment/Exercise - 01/14/19 0944      Self-Care   Other Self-Care Comments   reviewed the improtance of improving knee extension and flexion       Knee/Hip Exercises: Stretches   Active Hamstring Stretch  3 reps;20 seconds      Knee/Hip Exercises: Standing   Heel Raises  Both;1 set;20 reps    Forward Step Up  2 sets;10 reps;Step Height: 4"    Functional Squat  10 reps;2 sets    Other Standing Knee Exercises  standing march 2x10  Knee/Hip Exercises: Seated   Long Arc Quad  Strengthening;Left;2 sets;15 reps    Long Arc Quad Weight  3 lbs.    Hamstring Curl  Strengthening;Left;1 set;15 reps    Hamstring Limitations  green band       Knee/Hip Exercises: Supine   Short Arc Quad Sets  Strengthening;Left;1 set;20 reps    Short Arc Quad Sets Limitations  3lb     Straight Leg Raises  Strengthening      Manual Therapy   Manual therapy comments  patellar mobs , PROM L knee     Joint Mobilization  GR. III extension , Gr II flexion     Soft tissue mobilization  post thigh, calf              PT Education - 01/14/19 0959    Education Details  reviewed final HEP    Person(s) Educated  Patient    Methods  Explanation;Demonstration;Tactile cues;Verbal cues    Comprehension  Verbalized understanding;Returned demonstration;Tactile cues required;Verbal cues required       PT Short Term Goals - 01/14/19 1007      PT SHORT TERM GOAL #1   Title  Patient will increase left knee extension passive motion by 5 degrees    Baseline  -8    Time  4     Period  Weeks    Status  Achieved    Target Date  12/29/18      PT SHORT TERM GOAL #2   Title  Patient will increase passive knee flexion by 10 degrees    Baseline  97 degrees    Time  3    Period  Weeks    Status  Achieved    Target Date  12/22/18      PT SHORT TERM GOAL #3   Title  Patient will demonstrate decreased mid patella edema by 2.5 cm    Time  3    Period  Weeks    Status  Achieved    Target Date  12/22/18      PT SHORT TERM GOAL #4   Title  Patient will increase knee extension strength and left hip flexion strength by 1 grade    Baseline  quads 4-/5, hamstring 4/5 , hip flexors 4/5    Time  3    Period  Weeks    Status  Achieved    Target Date  12/22/18        PT Long Term Goals - 01/14/19 1008      PT LONG TERM GOAL #1   Title  Patient will go up/down 4 steps without any pain with reciprocal pattern    Baseline  able to perfrom steps without difficulty    Time  8    Period  Weeks    Status  Achieved      PT LONG TERM GOAL #2   Title  Patient will bend to pick object up without pain    Baseline  able to pick her shoes off the ground without difficulty today.    Time  8    Period  Weeks    Status  On-going      PT LONG TERM GOAL #3   Title  Patient will ambualte 3000' in the community without a device or increased pain.    Baseline  no pain or device used    Time  8    Period  Weeks    Status  Achieved  Plan - 01/14/19 1004    Clinical Impression Statement  Patient has reached goals for therapy. She is still lacking some extension and flexion but she has a plan to work on them at home. Unforutently her other knee that has been replaced continues to have motion restrictions. She has the potential to improve her motion but needs to work on it at home. Fucntionally she is back to doing her daily housework without pain. she is walking without pain. She is able to complete all exercises. Therapy reviewed her final HEP. D/C to HEP.     Personal Factors and Comorbidities  Comorbidity 1    Comorbidities  right TKA    Examination-Activity Limitations  Squat;Lift;Stand;Transfers;Sit;Carry;Locomotion Level    Examination-Participation Restrictions  Meal Prep;Yard Work;Cleaning;Shop;Laundry    Stability/Clinical Decision Making  Stable/Uncomplicated    Clinical Decision Making  Low    Rehab Potential  Good    PT Frequency  2x / week    PT Duration  6 weeks    PT Treatment/Interventions  ADLs/Self Care Home Management;Cryotherapy;Electrical Stimulation;Iontophoresis 67m/ml Dexamethasone;Ultrasound;Gait training;Stair training;Functional mobility training;Therapeutic activities;Therapeutic exercise;Neuromuscular re-education;Patient/family education;Manual techniques;Passive range of motion;Taping;DME Instruction    PT Next Visit Plan  D/C to HEP       Patient will benefit from skilled therapeutic intervention in order to improve the following deficits and impairments:  Abnormal gait, Difficulty walking, Decreased endurance, Decreased range of motion, Decreased activity tolerance, Decreased safety awareness, Pain, Increased fascial restricitons, Improper body mechanics, Impaired perceived functional ability  Visit Diagnosis: Stiffness of left knee, not elsewhere classified  Acute pain of left knee  Localized edema  Other abnormalities of gait and mobility     Problem List Patient Active Problem List   Diagnosis Date Noted  . 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 [place] 07/09[place]  . Osteoporosis 05/25/2017  . Status post total right knee replacement 05/16/2017  . Dyslipidemia 01/12/2010  . Hypertension 01/12/2010   PHYSICAL THERAPY DISCHARGE SUMMARY  Visits from Start of Care: 11  Current functional level related to goals / functional outcomes: improved ability to ambulate and perform ADL's    Remaining deficits: knee flexion and extension    Education /  Equipment: HEP  Plan: Patient agrees to discharge.  Patient goals were met. Patient is being discharged due to meeting the stated rehab goals.  ?????       DCarney Living12/17/2020, 3:51 PM  [facility]GM[address] [address]Phone: [phone]  Fax:  [phone] Name: TKlyn KroeningMRN: [id number] of Birth: [birth year]

## 2019-01-16 ENCOUNTER — Other Ambulatory Visit: Payer: Self-pay | Admitting: Orthopaedic Surgery

## 2019-02-08 ENCOUNTER — Other Ambulatory Visit: Payer: Self-pay

## 2019-02-08 ENCOUNTER — Ambulatory Visit (INDEPENDENT_AMBULATORY_CARE_PROVIDER_SITE_OTHER): Payer: Medicare Other | Admitting: Registered Nurse

## 2019-02-08 ENCOUNTER — Encounter: Payer: Self-pay | Admitting: Registered Nurse

## 2019-02-08 VITALS — BP 144/73 | HR 66 | Temp 98.1°F | Ht 61.0 in | Wt 147.0 lb

## 2019-02-08 DIAGNOSIS — F329 Major depressive disorder, single episode, unspecified: Secondary | ICD-10-CM

## 2019-02-08 DIAGNOSIS — L299 Pruritus, unspecified: Secondary | ICD-10-CM | POA: Diagnosis not present

## 2019-02-08 DIAGNOSIS — F419 Anxiety disorder, unspecified: Secondary | ICD-10-CM | POA: Diagnosis not present

## 2019-02-08 MED ORDER — ESCITALOPRAM OXALATE 10 MG PO TABS: 10.0000 mg | ORAL_TABLET | Freq: Every day | ORAL | 0 refills | Status: DC

## 2019-02-08 MED ORDER — HYDROXYZINE HCL 10 MG PO TABS: 10.0000 mg | ORAL_TABLET | Freq: Every evening | ORAL | 0 refills | Status: DC

## 2019-02-08 NOTE — Patient Instructions (Addendum)
Hello,  Wonderful to see you today. I hope you are feeling better soon. We will start two medications today: Escitalopram (Lexapro) for feelings of sadness and anxiety. Please take this every morning with breakfast. This will be at a dose of 10mg  Hydroxyzine for itchiness at night. Please take this about 30 minutes before bed. If you wake up in the night feeling itchy, you may take another tablet. These can be sedating, so please take with caution. This will be a dose of 10mg .  I would like to see you again in 6 weeks to check in and see how you are doing. Please schedule this appointment on your way out today.   Thank you!  Ginette Otto,  Th?t tuy?t khi g?p b?n hm nay. Ti hy v?ng b?n s? c?m th?y t?t h?n s?m. Chng ti s? b?t ??u hai lo?i thu?c hm nay: Escitalopram (Lexapro) cho c?m gic bu?n v lo l?ng. Vui lng dng mn ny m?i sng cng v?i b?a sng. ?i?u ny s? ? li?u 10mg  Hydroxyzine tr? ng?a vo ban ?m. Hy th?c hi?n ?i?u ny kho?ng 30 pht tr??c khi ?i ng?. N?u b?n th?c d?y vo ban ?m c?m th?y ng?a, b?n c th? u?ng m?t vin thu?c khc. Chng c th? an th?n, v v?y hy th?n tr?ng. ?y s? l li?u 10mg .  Ti mu?n g?p l?i b?n sau 6 tu?n n?a ?? ki?m tra v xem b?n th? no. Vui lng ln l?ch cu?c h?n ny trn ???ng ?i c?a b?n hm nay.  C?m ?n b?n!  If you have lab work done today you will be contacted with your lab results within the next 2 weeks.  If you have not heard from [place] then please contact us. The fastest way to get your results is to register for My Chart.   IF you received an x-ray today, you will receive an invoice from [facility]. Please contact [facility] at [phone] with questions or concerns regarding your invoice.   IF you received labwork today, you will receive an invoice from [place]. Please contact [facility] at [phone] with questions or concerns regarding your invoice.   Our billing staff will not be able to assist you with questions  regarding bills from these companies.  You will be contacted with the lab results as soon as they are available. The fastest way to get your results is to activate your My Chart account. Instructions are located on the last page of this paperwork. If you have not heard from [place] regarding the results in 2 weeks, please contact this office.     Escitalopram tablets What is this medicine? ESCITALOPRAM (es sye TAL oh pram) is used to treat depression and certain types of anxiety. This medicine may be used for other purposes; ask your health care provider or pharmacist if you have questions. COMMON BRAND NAME(S): Lexapro What should I tell my health care provider before I take this medicine? They need to know if you have any of these conditions:  bipolar disorder or a family history of bipolar disorder  diabetes  glaucoma  heart disease  kidney or liver disease  receiving electroconvulsive therapy  seizures (convulsions)  suicidal thoughts, plans, or attempt by you or a family member  an unusual or allergic reaction to escitalopram, the related drug citalopram, other medicines, foods, dyes, or preservatives  pregnant or trying to become pregnant  breast-feeding How should I use this medicine? Take this medicine by mouth with a glass of water. Follow the directions  on the prescription label. You can take it with or without food. If it upsets your stomach, take it with food. Take your medicine at regular intervals. Do not take it more often than directed. Do not stop taking this medicine suddenly except upon the advice of your doctor. Stopping this medicine too quickly may cause serious side effects or your condition may worsen. A special MedGuide will be given to you by the pharmacist with each prescription and refill. Be sure to read this information carefully each time. Talk to your pediatrician regarding the use of this medicine in children. Special care may be needed. Overdosage:  If you think you have taken too much of this medicine contact a poison control center or emergency room at once. NOTE: This medicine is only for you. Do not share this medicine with others. What if I miss a dose? If you miss a dose, take it as soon as you can. If it is almost time for your next dose, take only that dose. Do not take double or extra doses. What may interact with this medicine? Do not take this medicine with any of the following medications:  certain medicines for fungal infections like fluconazole, itraconazole, ketoconazole, posaconazole, voriconazole  cisapride  citalopram  dronedarone  linezolid  MAOIs like Carbex, Eldepryl, Marplan, Nardil, and Parnate  methylene blue (injected into a vein)  pimozide  thioridazine This medicine may also interact with the following medications:  alcohol  amphetamines  aspirin and aspirin-like medicines  carbamazepine  certain medicines for depression, anxiety, or psychotic disturbances  certain medicines for migraine headache like almotriptan, eletriptan, frovatriptan, naratriptan, rizatriptan, sumatriptan, zolmitriptan  certain medicines for sleep  certain medicines that treat or prevent blood clots like warfarin, enoxaparin, dalteparin  cimetidine  diuretics  dofetilide  fentanyl  furazolidone  isoniazid  lithium  metoprolol  NSAIDs, medicines for pain and inflammation, like ibuprofen or naproxen  other medicines that prolong the QT interval (cause an abnormal heart rhythm)  procarbazine  rasagiline  supplements like St. John's wort, kava kava, valerian  tramadol  tryptophan  ziprasidone This list may not describe all possible interactions. Give your health care provider a list of all the medicines, herbs, non-prescription drugs, or dietary supplements you use. Also tell them if you smoke, drink alcohol, or use illegal drugs. Some items may interact with your medicine. What should I watch  for while using this medicine? Tell your doctor if your symptoms do not get better or if they get worse. Visit your doctor or health care professional for regular checks on your progress. Because it may take several weeks to see the full effects of this medicine, it is important to continue your treatment as prescribed by your doctor. Patients and their families should watch out for new or worsening thoughts of suicide or depression. Also watch out for sudden changes in feelings such as feeling anxious, agitated, panicky, irritable, hostile, aggressive, impulsive, severely restless, overly excited and hyperactive, or not being able to sleep. If this happens, especially at the beginning of treatment or after a change in dose, call your health care professional. Dennis Bast may get drowsy or dizzy. Do not drive, use machinery, or do anything that needs mental alertness until you know how this medicine affects you. Do not stand or sit up quickly, especially if you are an older patient. This reduces the risk of dizzy or fainting spells. Alcohol may interfere with the effect of this medicine. Avoid alcoholic drinks. Your mouth may get dry.  Chewing sugarless gum or sucking hard candy, and drinking plenty of water may help. Contact your doctor if the problem does not go away or is severe. What side effects may I notice from receiving this medicine? Side effects that you should report to your doctor or health care professional as soon as possible:  allergic reactions like skin rash, itching or hives, swelling of the face, lips, or tongue  anxious  black, tarry stools  changes in vision  confusion  elevated mood, decreased need for sleep, racing thoughts, impulsive behavior  eye pain  fast, irregular heartbeat  feeling faint or lightheaded, falls  feeling agitated, angry, or irritable  hallucination, loss of contact with reality  loss of balance or coordination  loss of memory  painful or prolonged  erections  restlessness, pacing, inability to keep still  seizures  stiff muscles  suicidal thoughts or other mood changes  trouble sleeping  unusual bleeding or bruising  unusually weak or tired  vomiting Side effects that usually do not require medical attention (report to your doctor or health care professional if they continue or are bothersome):  changes in appetite  change in sex drive or performance  headache  increased sweating  indigestion, nausea  tremors This list may not describe all possible side effects. Call your doctor for medical advice about side effects. You may report side effects to FDA at 1-800-FDA-1088. Where should I keep my medicine? Keep out of reach of children. Store at room temperature between 15 and 30 degrees C (59 and 86 degrees F). Throw away any unused medicine after the expiration date. NOTE: This sheet is a summary. It may not cover all possible information. If you have questions about this medicine, talk to your doctor, pharmacist, or health care provider.  2020 Elsevier/Gold Standard (2018-01-05 11:21:44) Hydroxyzine capsules or tablets What is this medicine? HYDROXYZINE (hye [place] i zeen) is an antihistamine. This medicine is used to treat allergy symptoms. It is also used to treat anxiety and tension. This medicine can be used with other medicines to induce sleep before surgery. This medicine may be used for other purposes; ask your health care provider or pharmacist if you have questions. COMMON BRAND NAME(S): ANX, Atarax, Rezine, Vistaril What should I tell my health care provider before I take this medicine? They need to know if you have any of these conditions:  glaucoma  heart disease  history of irregular heartbeat  kidney disease  liver disease  lung or breathing disease, like asthma  stomach or intestine problems  thyroid disease  trouble passing urine  an unusual or allergic reaction to hydroxyzine,  cetirizine, other medicines, foods, dyes or preservatives  pregnant or trying to get pregnant  breast-feeding How should I use this medicine? Take this medicine by mouth with a full glass of water. Follow the directions on the prescription label. You may take this medicine with food or on an empty stomach. Take your medicine at regular intervals. Do not take your medicine more often than directed. Talk to your pediatrician regarding the use of this medicine in children. Special care may be needed. While this drug may be prescribed for children as young as 77 years of age for selected conditions, precautions do apply. Patients over 9 years old may have a stronger reaction and need a smaller dose. Overdosage: If you think you have taken too much of this medicine contact a poison control center or emergency room at once. NOTE: This medicine is only for you.  Do not share this medicine with others. What if I miss a dose? If you miss a dose, take it as soon as you can. If it is almost time for your next dose, take only that dose. Do not take double or extra doses. What may interact with this medicine? Do not take this medicine with any of the following medications:  cisapride  dronedarone  pimozide  thioridazine This medicine may also interact with the following medications:  alcohol  antihistamines for allergy, cough, and cold  atropine  barbiturate medicines for sleep or seizures, like phenobarbital  certain antibiotics like erythromycin or clarithromycin  certain medicines for anxiety or sleep  certain medicines for bladder problems like oxybutynin, tolterodine  certain medicines for depression or psychotic disturbances  certain medicines for irregular heart beat  certain medicines for Parkinson's disease like benztropine, trihexyphenidyl  certain medicines for seizures like phenobarbital, primidone  certain medicines for stomach problems like dicyclomine,  hyoscyamine  certain medicines for travel sickness like scopolamine  ipratropium  narcotic medicines for pain  other medicines that prolong the QT interval (an abnormal heart rhythm) like dofetilide This list may not describe all possible interactions. Give your health care provider a list of all the medicines, herbs, non-prescription drugs, or dietary supplements you use. Also tell them if you smoke, drink alcohol, or use illegal drugs. Some items may interact with your medicine. What should I watch for while using this medicine? Tell your doctor or health care professional if your symptoms do not improve. You may get drowsy or dizzy. Do not drive, use machinery, or do anything that needs mental alertness until you know how this medicine affects you. Do not stand or sit up quickly, especially if you are an older patient. This reduces the risk of dizzy or fainting spells. Alcohol may interfere with the effect of this medicine. Avoid alcoholic drinks. Your mouth may get dry. Chewing sugarless gum or sucking hard candy, and drinking plenty of water may help. Contact your doctor if the problem does not go away or is severe. This medicine may cause dry eyes and blurred vision. If you wear contact lenses you may feel some discomfort. Lubricating drops may help. See your eye doctor if the problem does not go away or is severe. If you are receiving skin tests for allergies, tell your doctor you are using this medicine. What side effects may I notice from receiving this medicine? Side effects that you should report to your doctor or health care professional as soon as possible:  allergic reactions like skin rash, itching or hives, swelling of the face, lips, or tongue  changes in vision  confusion  fast, irregular heartbeat  seizures  tremor  trouble passing urine or change in the amount of urine Side effects that usually do not require medical attention (report to your doctor or health care  professional if they continue or are bothersome):  constipation  drowsiness  dry mouth  headache  tiredness This list may not describe all possible side effects. Call your doctor for medical advice about side effects. You may report side effects to FDA at 1-800-FDA-1088. Where should I keep my medicine? Keep out of the reach of children. Store at room temperature between 15 and 30 degrees C (59 and 86 degrees F). Keep container tightly closed. Throw away any unused medicine after the expiration date. NOTE: This sheet is a summary. It may not cover all possible information. If you have questions about this medicine, talk to  your doctor, pharmacist, or health care provider.  2020 Elsevier/Gold Standard (2018-01-05 13:19:55) Escitalopram tablets What is this medicine? ESCITALOPRAM (es sye TAL oh pram) is used to treat depression and certain types of anxiety. This medicine may be used for other purposes; ask your health care provider or pharmacist if you have questions. COMMON BRAND NAME(S): Lexapro What should I tell my health care provider before I take this medicine? They need to know if you have any of these conditions:  bipolar disorder or a family history of bipolar disorder  diabetes  glaucoma  heart disease  kidney or liver disease  receiving electroconvulsive therapy  seizures (convulsions)  suicidal thoughts, plans, or attempt by you or a family member  an unusual or allergic reaction to escitalopram, the related drug citalopram, other medicines, foods, dyes, or preservatives  pregnant or trying to become pregnant  breast-feeding How should I use this medicine? Take this medicine by mouth with a glass of water. Follow the directions on the prescription label. You can take it with or without food. If it upsets your stomach, take it with food. Take your medicine at regular intervals. Do not take it more often than directed. Do not stop taking this medicine suddenly  except upon the advice of your doctor. Stopping this medicine too quickly may cause serious side effects or your condition may worsen. A special MedGuide will be given to you by the pharmacist with each prescription and refill. Be sure to read this information carefully each time. Talk to your pediatrician regarding the use of this medicine in children. Special care may be needed. Overdosage: If you think you have taken too much of this medicine contact a poison control center or emergency room at once. NOTE: This medicine is only for you. Do not share this medicine with others. What if I miss a dose? If you miss a dose, take it as soon as you can. If it is almost time for your next dose, take only that dose. Do not take double or extra doses. What may interact with this medicine? Do not take this medicine with any of the following medications:  certain medicines for fungal infections like fluconazole, itraconazole, ketoconazole, posaconazole, voriconazole  cisapride  citalopram  dronedarone  linezolid  MAOIs like Carbex, Eldepryl, Marplan, Nardil, and Parnate  methylene blue (injected into a vein)  pimozide  thioridazine This medicine may also interact with the following medications:  alcohol  amphetamines  aspirin and aspirin-like medicines  carbamazepine  certain medicines for depression, anxiety, or psychotic disturbances  certain medicines for migraine headache like almotriptan, eletriptan, frovatriptan, naratriptan, rizatriptan, sumatriptan, zolmitriptan  certain medicines for sleep  certain medicines that treat or prevent blood clots like warfarin, enoxaparin, dalteparin  cimetidine  diuretics  dofetilide  fentanyl  furazolidone  isoniazid  lithium  metoprolol  NSAIDs, medicines for pain and inflammation, like ibuprofen or naproxen  other medicines that prolong the QT interval (cause an abnormal heart  rhythm)  procarbazine  rasagiline  supplements like St. John's wort, kava kava, valerian  tramadol  tryptophan  ziprasidone This list may not describe all possible interactions. Give your health care provider a list of all the medicines, herbs, non-prescription drugs, or dietary supplements you use. Also tell them if you smoke, drink alcohol, or use illegal drugs. Some items may interact with your medicine. What should I watch for while using this medicine? Tell your doctor if your symptoms do not get better or if they get worse. Visit your  doctor or health care professional for regular checks on your progress. Because it may take several weeks to see the full effects of this medicine, it is important to continue your treatment as prescribed by your doctor. Patients and their families should watch out for new or worsening thoughts of suicide or depression. Also watch out for sudden changes in feelings such as feeling anxious, agitated, panicky, irritable, hostile, aggressive, impulsive, severely restless, overly excited and hyperactive, or not being able to sleep. If this happens, especially at the beginning of treatment or after a change in dose, call your health care professional. Dennis Bast may get drowsy or dizzy. Do not drive, use machinery, or do anything that needs mental alertness until you know how this medicine affects you. Do not stand or sit up quickly, especially if you are an older patient. This reduces the risk of dizzy or fainting spells. Alcohol may interfere with the effect of this medicine. Avoid alcoholic drinks. Your mouth may get dry. Chewing sugarless gum or sucking hard candy, and drinking plenty of water may help. Contact your doctor if the problem does not go away or is severe. What side effects may I notice from receiving this medicine? Side effects that you should report to your doctor or health care professional as soon as possible:  allergic reactions like skin rash,  itching or hives, swelling of the face, lips, or tongue  anxious  black, tarry stools  changes in vision  confusion  elevated mood, decreased need for sleep, racing thoughts, impulsive behavior  eye pain  fast, irregular heartbeat  feeling faint or lightheaded, falls  feeling agitated, angry, or irritable  hallucination, loss of contact with reality  loss of balance or coordination  loss of memory  painful or prolonged erections  restlessness, pacing, inability to keep still  seizures  stiff muscles  suicidal thoughts or other mood changes  trouble sleeping  unusual bleeding or bruising  unusually weak or tired  vomiting Side effects that usually do not require medical attention (report to your doctor or health care professional if they continue or are bothersome):  changes in appetite  change in sex drive or performance  headache  increased sweating  indigestion, nausea  tremors This list may not describe all possible side effects. Call your doctor for medical advice about side effects. You may report side effects to FDA at 1-800-FDA-1088. Where should I keep my medicine? Keep out of reach of children. Store at room temperature between 15 and 30 degrees C (59 and 86 degrees F). Throw away any unused medicine after the expiration date. NOTE: This sheet is a summary. It may not cover all possible information. If you have questions about this medicine, talk to your doctor, pharmacist, or health care provider.  2020 Elsevier/Gold Standard (2018-01-05 11:21:44) Hydroxyzine capsules or tablets What is this medicine? HYDROXYZINE (hye [place] i zeen) is an antihistamine. This medicine is used to treat allergy symptoms. It is also used to treat anxiety and tension. This medicine can be used with other medicines to induce sleep before surgery. This medicine may be used for other purposes; ask your health care provider or pharmacist if you have questions. COMMON  BRAND NAME(S): ANX, Atarax, Rezine, Vistaril What should I tell my health care provider before I take this medicine? They need to know if you have any of these conditions:  glaucoma  heart disease  history of irregular heartbeat  kidney disease  liver disease  lung or breathing disease, like asthma  stomach or intestine problems  thyroid disease  trouble passing urine  an unusual or allergic reaction to hydroxyzine, cetirizine, other medicines, foods, dyes or preservatives  pregnant or trying to get pregnant  breast-feeding How should I use this medicine? Take this medicine by mouth with a full glass of water. Follow the directions on the prescription label. You may take this medicine with food or on an empty stomach. Take your medicine at regular intervals. Do not take your medicine more often than directed. Talk to your pediatrician regarding the use of this medicine in children. Special care may be needed. While this drug may be prescribed for children as young as 75 years of age for selected conditions, precautions do apply. Patients over 47 years old may have a stronger reaction and need a smaller dose. Overdosage: If you think you have taken too much of this medicine contact a poison control center or emergency room at once. NOTE: This medicine is only for you. Do not share this medicine with others. What if I miss a dose? If you miss a dose, take it as soon as you can. If it is almost time for your next dose, take only that dose. Do not take double or extra doses. What may interact with this medicine? Do not take this medicine with any of the following medications:  cisapride  dronedarone  pimozide  thioridazine This medicine may also interact with the following medications:  alcohol  antihistamines for allergy, cough, and cold  atropine  barbiturate medicines for sleep or seizures, like phenobarbital  certain antibiotics like erythromycin or  clarithromycin  certain medicines for anxiety or sleep  certain medicines for bladder problems like oxybutynin, tolterodine  certain medicines for depression or psychotic disturbances  certain medicines for irregular heart beat  certain medicines for Parkinson's disease like benztropine, trihexyphenidyl  certain medicines for seizures like phenobarbital, primidone  certain medicines for stomach problems like dicyclomine, hyoscyamine  certain medicines for travel sickness like scopolamine  ipratropium  narcotic medicines for pain  other medicines that prolong the QT interval (an abnormal heart rhythm) like dofetilide This list may not describe all possible interactions. Give your health care provider a list of all the medicines, herbs, non-prescription drugs, or dietary supplements you use. Also tell them if you smoke, drink alcohol, or use illegal drugs. Some items may interact with your medicine. What should I watch for while using this medicine? Tell your doctor or health care professional if your symptoms do not improve. You may get drowsy or dizzy. Do not drive, use machinery, or do anything that needs mental alertness until you know how this medicine affects you. Do not stand or sit up quickly, especially if you are an older patient. This reduces the risk of dizzy or fainting spells. Alcohol may interfere with the effect of this medicine. Avoid alcoholic drinks. Your mouth may get dry. Chewing sugarless gum or sucking hard candy, and drinking plenty of water may help. Contact your doctor if the problem does not go away or is severe. This medicine may cause dry eyes and blurred vision. If you wear contact lenses you may feel some discomfort. Lubricating drops may help. See your eye doctor if the problem does not go away or is severe. If you are receiving skin tests for allergies, tell your doctor you are using this medicine. What side effects may I notice from receiving this  medicine? Side effects that you should report to your doctor or health care professional as  soon as possible:  allergic reactions like skin rash, itching or hives, swelling of the face, lips, or tongue  changes in vision  confusion  fast, irregular heartbeat  seizures  tremor  trouble passing urine or change in the amount of urine Side effects that usually do not require medical attention (report to your doctor or health care professional if they continue or are bothersome):  constipation  drowsiness  dry mouth  headache  tiredness This list may not describe all possible side effects. Call your doctor for medical advice about side effects. You may report side effects to FDA at 1-800-FDA-1088. Where should I keep my medicine? Keep out of the reach of children. Store at room temperature between 15 and 30 degrees C (59 and 86 degrees F). Keep container tightly closed. Throw away any unused medicine after the expiration date. NOTE: This sheet is a summary. It may not cover all possible information. If you have questions about this medicine, talk to your doctor, pharmacist, or health care provider.  2020 Elsevier/Gold Standard (2018-01-05 13:19:55)

## 2019-02-08 NOTE — Progress Notes (Signed)
Established Patient Office Visit  Subjective:  Patient ID: Ariel Harris, female    DOB: 29-Sep-1945  Age: 74 y.o. MRN: [id number]  CC:  Chief Complaint  Patient presents with  . itchness all over body    at night time only and no rash.   . Depression    HPI Ariel Harris presents for itchiness and anxiety  Notes that recently, she discovered that her husband and partner of 20+ years had another family abroad. This has led to a number of complications in her home life and much anxiety. She states that she is trying to get over this with the help of her friends and family - good support system.  Reports at night she has had itching all over. No rash. Has tried OTC topicals without success. This has affected her sleep.  No changes to detergents, clothing, exposures, etc.  Past Medical History:  Diagnosis Date  . Arthritis    "knees" (05/16/2017)  . Chest pain   . Hyperlipidemia   . Hypertension   . PAC (premature atrial contraction)   . SVT (supraventricular tachycardia) (HCC)     Past Surgical History:  Procedure Laterality Date  . JOINT REPLACEMENT    . KNEE ARTHROSCOPY Left 1996  . KNEE SURGERY Right [phone]   "open surgery; because of the pain"  . 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 TOTAL KNEE ARTHROPLASTY Right 05/16/2017   Procedure: RIGHT TOTAL KNEE ARTHROPLASTY;  Surgeon: Leandrew Koyanagi, MD;  Location: [facility];  Service: Orthopedics;  Laterality: Right;  . TOTAL KNEE ARTHROPLASTY Left 11/16/2018   Procedure: LEFT TOTAL KNEE ARTHROPLASTY;  Surgeon: Leandrew Koyanagi, MD;  Location: [place];  Service: Orthopedics;  Laterality: Left;    Family History  Problem Relation Age of Onset  . Coronary artery disease Neg Hx   . Heart attack Neg Hx   . Breast cancer Neg Hx     Social History   Socioeconomic History  . Marital status: Divorced    Spouse name: Not on file  . Number of children: 2  . Years of education: Not on file  . Highest education level:  Not on file  Occupational History  . Not on file  Tobacco Use  . Smoking status: Never Smoker  . Smokeless tobacco: Never Used  Substance and Sexual Activity  . Alcohol use: No  . Drug use: No  . Sexual activity: Not Currently  Other Topics Concern  . Not on file  Social History Narrative   Negative family `history--Specifically artery disease or MI. The patient has two siblings died at a young age from fevers.   The patient is separated and single. She is retired but used to work at a [employer] and a [employer]. She is independent of daily living. She has never drink or smoke or done drugs   Social Determinants of Health   Financial Resource Strain:   . Difficulty of Paying Living Expenses: Not on file  Food Insecurity:   . Worried About Charity fundraiser in the Last Year: Not on file  . Ran Out of Food in the Last Year: Not on file  Transportation Needs:   . Lack of Transportation (Medical): Not on file  . Lack of Transportation (Non-Medical): Not on file  Physical Activity:   . Days of Exercise per Week: Not on file  . Minutes of Exercise per Session: Not on file  Stress:   . Feeling of Stress :  Not on file  Social Connections:   . Frequency of Communication with Friends and Family: Not on file  . Frequency of Social Gatherings with Friends and Family: Not on file  . Attends Religious Services: Not on file  . Active Member of Clubs or Organizations: Not on file  . Attends Archivist Meetings: Not on file  . Marital Status: Not on file  Intimate Partner Violence:   . Fear of Current or Ex-Partner: Not on file  . Emotionally Abused: Not on file  . Physically Abused: Not on file  . Sexually Abused: Not on file    Outpatient Medications Prior to Visit  Medication Sig Dispense Refill  . aspirin 81 MG EC tablet TAKE 1 TABLET (81 MG TOTAL) BY MOUTH 2 (TWO) TIMES DAILY. 84 tablet 0  . betamethasone dipropionate 0.05 % cream Apply topically 2 (two) times daily. 30 g  0  . calcium-vitamin D (OSCAL WITH D) 500-200 MG-UNIT tablet Take 1 tablet by mouth daily with breakfast.    . metoprolol succinate (TOPROL XL) 25 MG 24 hr tablet Take 1 tablet by mouth daily, may take 1 extra tablet by mouth daily only as needed for palpitations 150 tablet 1  . pravastatin (PRAVACHOL) 40 MG tablet Take 1 tablet (40 mg total) by mouth daily. 90 tablet 3  . ondansetron (ZOFRAN) 4 MG tablet Take 1-2 tablets (4-8 mg total) by mouth every 8 (eight) hours as needed for nausea or vomiting. (Patient not taking: Reported on 01/08/2019) 40 tablet 0  . methocarbamol (ROBAXIN) 750 MG tablet Take 1 tablet (750 mg total) by mouth 2 (two) times daily as needed for muscle spasms. (Patient not taking: Reported on 02/08/2019) 60 tablet 0  . oxyCODONE (OXY IR/ROXICODONE) 5 MG immediate release tablet Take 1-2 tablets (5-10 mg total) by mouth every 8 (eight) hours as needed for severe pain. (Patient not taking: Reported on 01/08/2019) 30 tablet 0  . senna-docusate (SENOKOT S) 8.6-50 MG tablet Take 1-2 tablets by mouth at bedtime as needed. (Patient not taking: Reported on 01/08/2019) 30 tablet 1   No facility-administered medications prior to visit.    Allergies  Allergen Reactions  . Indomethacin Swelling  . Tramadol     Sweaty, made feel bad    ROS Review of Systems  Constitutional: Negative.   HENT: Negative.   Eyes: Negative.   Respiratory: Negative.   Cardiovascular: Negative.   Gastrointestinal: Negative.   Endocrine: Negative.   Genitourinary: Negative.   Musculoskeletal: Negative.   Skin: Negative for color change, pallor, rash and wound.       Itching  Allergic/Immunologic: Negative.   Neurological: Negative.   Hematological: Negative.   Psychiatric/Behavioral: Positive for sleep disturbance. Negative for agitation, behavioral problems, confusion, decreased concentration, dysphoric mood, hallucinations, self-injury and suicidal ideas. The patient is nervous/anxious. The  patient is not hyperactive.   All other systems reviewed and are negative.     Objective:    Physical Exam  Constitutional: She is oriented to person, place, and time. She appears well-developed and well-nourished. No distress.  Cardiovascular: Normal rate and regular rhythm.  Pulmonary/Chest: Effort normal. No respiratory distress.  Neurological: She is alert and oriented to person, place, and time.  Skin: Skin is warm and dry. No rash noted. She is not diaphoretic. No erythema. No pallor.  Psychiatric: She has a normal mood and affect. Her behavior is normal. Judgment and thought content normal.  Nursing note and vitals reviewed.   BP (!) 144/73  Pulse 66   Temp 98.1 F (36.7 C) (Temporal)   Ht 5\' 1"  (1.549 m)   Wt 147 lb (66.7 kg)   SpO2 94%   BMI 27.78 kg/m  Wt Readings from Last 3 Encounters:  02/08/19 147 lb (66.7 kg)  01/08/19 148 lb (67.1 kg)  12/29/18 155 lb (70.3 kg)     Health Maintenance Due  Topic Date Due  . Hepatitis C Screening  1945-03-26    There are no preventive care reminders to display for this patient.  Lab Results  Component Value Date   TSH 0.65 12/09/2012   Lab Results  Component Value Date   WBC 3.5 01/08/2019   HGB 13.7 01/08/2019   HCT 42.0 01/08/2019   MCV 88 01/08/2019   PLT 235 01/08/2019   Lab Results  Component Value Date   NA 138 01/08/2019   K 3.8 01/08/2019   CO2 23 01/08/2019   GLUCOSE 107 (H) 01/08/2019   BUN 13 01/08/2019   CREATININE 0.85 01/08/2019   BILITOT 0.8 01/08/2019   ALKPHOS 48 01/08/2019   AST 19 01/08/2019   ALT 9 01/08/2019   PROT 7.6 01/08/2019   ALBUMIN 4.0 01/08/2019   CALCIUM 9.4 01/08/2019   ANIONGAP 6 11/17/2018   GFR 71.18 12/09/2012   Lab Results  Component Value Date   CHOL 182 01/08/2019   Lab Results  Component Value Date   HDL 62 01/08/2019   Lab Results  Component Value Date   LDLCALC 108 (H) 01/08/2019   Lab Results  Component Value Date   TRIG 65 01/08/2019   Lab  Results  Component Value Date   CHOLHDL 2.9 01/08/2019   Lab Results  Component Value Date   HGBA1C (H) 11/23/2009    6.0 (NOTE)                                                                       According to the ADA Clinical Practice Recommendations for 2011, when HbA1c is used as a screening test:   >=6.5%   Diagnostic of Diabetes Mellitus           (if abnormal result  is confirmed)  5.7-6.4%   Increased risk of developing Diabetes Mellitus  References:Diagnosis and Classification of Diabetes Mellitus,Diabetes [id number] 1):S62-S69 and Standards of Medical Care in         Diabetes - 2011,Diabetes Care,2011,34  (Suppl 1):S11-S61.      Assessment & Plan:   Problem List Items Addressed This Visit    None    Visit Diagnoses    Itching    -  Primary   Relevant Medications   hydrOXYzine (ATARAX/VISTARIL) 10 MG tablet   Anxiety and depression       Relevant Medications   escitalopram (LEXAPRO) 10 MG tablet   hydrOXYzine (ATARAX/VISTARIL) 10 MG tablet      Meds ordered this encounter  Medications  . escitalopram (LEXAPRO) 10 MG tablet    Sig: Take 1 tablet (10 mg total) by mouth daily.    Dispense:  90 tablet    Refill:  0    Order Specific Question:   Supervising Provider    Answer:   Delia Chimes A [id number]  . hydrOXYzine (ATARAX/VISTARIL) 10 MG tablet  Sig: Take 1 tablet (10 mg total) by mouth every evening. May take 1 additional tablet if needed to reach desired effect.    Dispense:  90 tablet    Refill:  0    Order Specific Question:   Supervising Provider    Answer:   Forrest Moron [id number]    Follow-up: No follow-ups on file.   PLAN  Feel that her itching is likely related to her anxiety. As such will treat both  Escitalopram 10mg  PO qam  Hydroxyzine 10mg  PO qhs PRN, may repeat if waking with itchiness. Discussed that this medication can be sedating.   Return in 6 weeks for med check  Patient encouraged to call clinic with any questions,  comments, or concerns. I spent 22 minutes with this patient, more than 50% of which was spent counseling/educating.  Maximiano Coss, NP

## 2019-02-09 ENCOUNTER — Ambulatory Visit (INDEPENDENT_AMBULATORY_CARE_PROVIDER_SITE_OTHER): Payer: Medicare Other | Admitting: Physician Assistant

## 2019-02-09 ENCOUNTER — Encounter: Payer: Self-pay | Admitting: Orthopaedic Surgery

## 2019-02-09 DIAGNOSIS — Z96652 Presence of left artificial knee joint: Secondary | ICD-10-CM

## 2019-02-09 NOTE — Progress Notes (Signed)
   Post-Op Visit Note   Patient: Ariel Harris           Date of Birth: May 26, 1945           MRN: [id number] Visit Date: 02/09/2019 PCP: Antonietta Jewel, MD   Assessment & Plan:  Chief Complaint:  Chief Complaint  Patient presents with  . Left Knee - Pain   Visit Diagnoses:  1. S/P TKR (total knee replacement), left     Plan: Patient is a pleasant 74 year old female who comes in today 12 weeks out left total knee replacement 11/16/2018.  She has been doing well.  She has noted some burning and itching to the incision but nothing more.  She has finished physical therapy.  Minimal to no pain.  Examination of her left knee reveals a fully healed surgical scar without complication.  Range of motion from about 5 degrees of extension lag to 100 degrees of flexion.  Stable valgus varus stress.  Calf is soft nontender.  She is neurovascular intact distally.  This point, she will work on desensitizing the incision with massage therapy.  Continue with her home exercise program.  Dental prophylaxis reinforced.  Follow-up with [place] in 9 months time for repeat evaluation and 2 view x-rays of both total knee replacements.  Follow-Up Instructions: Return in about 9 months (around 11/09/2019).   Orders:  No orders of the defined types were placed in this encounter.  No orders of the defined types were placed in this encounter.   Imaging: No new imaging  PMFS History: Patient Active Problem List   Diagnosis Date Noted  . 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 SVT (supraventricular tachycardia) ([place]) 07/09/2018  . Osteoporosis 05/25/2017  . Status post total right knee replacement 05/16/2017  . Dyslipidemia 01/12/2010  . Hypertension 01/12/2010   Past Medical History:  Diagnosis Date  . Arthritis    "knees" (05/16/2017)  . Chest pain   . Hyperlipidemia   . Hypertension   . PAC (premature atrial contraction)   . SVT (supraventricular tachycardia) (HCC)     Family History    Problem Relation Age of Onset  . Coronary artery disease Neg Hx   . Heart attack Neg Hx   . Breast cancer Neg Hx     Past Surgical History:  Procedure Laterality Date  . JOINT REPLACEMENT    . KNEE ARTHROSCOPY Left 1996  . KNEE SURGERY Right [phone]   "open surgery; because of the pain"  . 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 TOTAL KNEE ARTHROPLASTY Right 05/16/2017   Procedure: RIGHT TOTAL KNEE ARTHROPLASTY;  Surgeon: Leandrew Koyanagi, MD;  Location: [facility];  Service: Orthopedics;  Laterality: Right;  . TOTAL KNEE ARTHROPLASTY Left 11/16/2018   Procedure: LEFT TOTAL KNEE ARTHROPLASTY;  Surgeon: Leandrew Koyanagi, MD;  Location: [facility];  Service: Orthopedics;  Laterality: Left;   Social History   Occupational History  . Not on file  Tobacco Use  . Smoking status: Never Smoker  . Smokeless tobacco: Never Used  Substance and Sexual Activity  . Alcohol use: No  . Drug use: No  . Sexual activity: Not Currently

## 2019-02-19 IMAGING — DX DG KNEE 1-2V PORT*R*
1 series · 2 of 2 positions shown · non-contrast
Comparison: Right knee x-rays dated November 01, 2016.

CLINICAL DATA: Total knee replacement.

EXAM:
PORTABLE RIGHT KNEE - 1-2 VIEW

[Series 1: knee · 0.14mm/px · 2 of 2 slices shown]
[im 1/2]
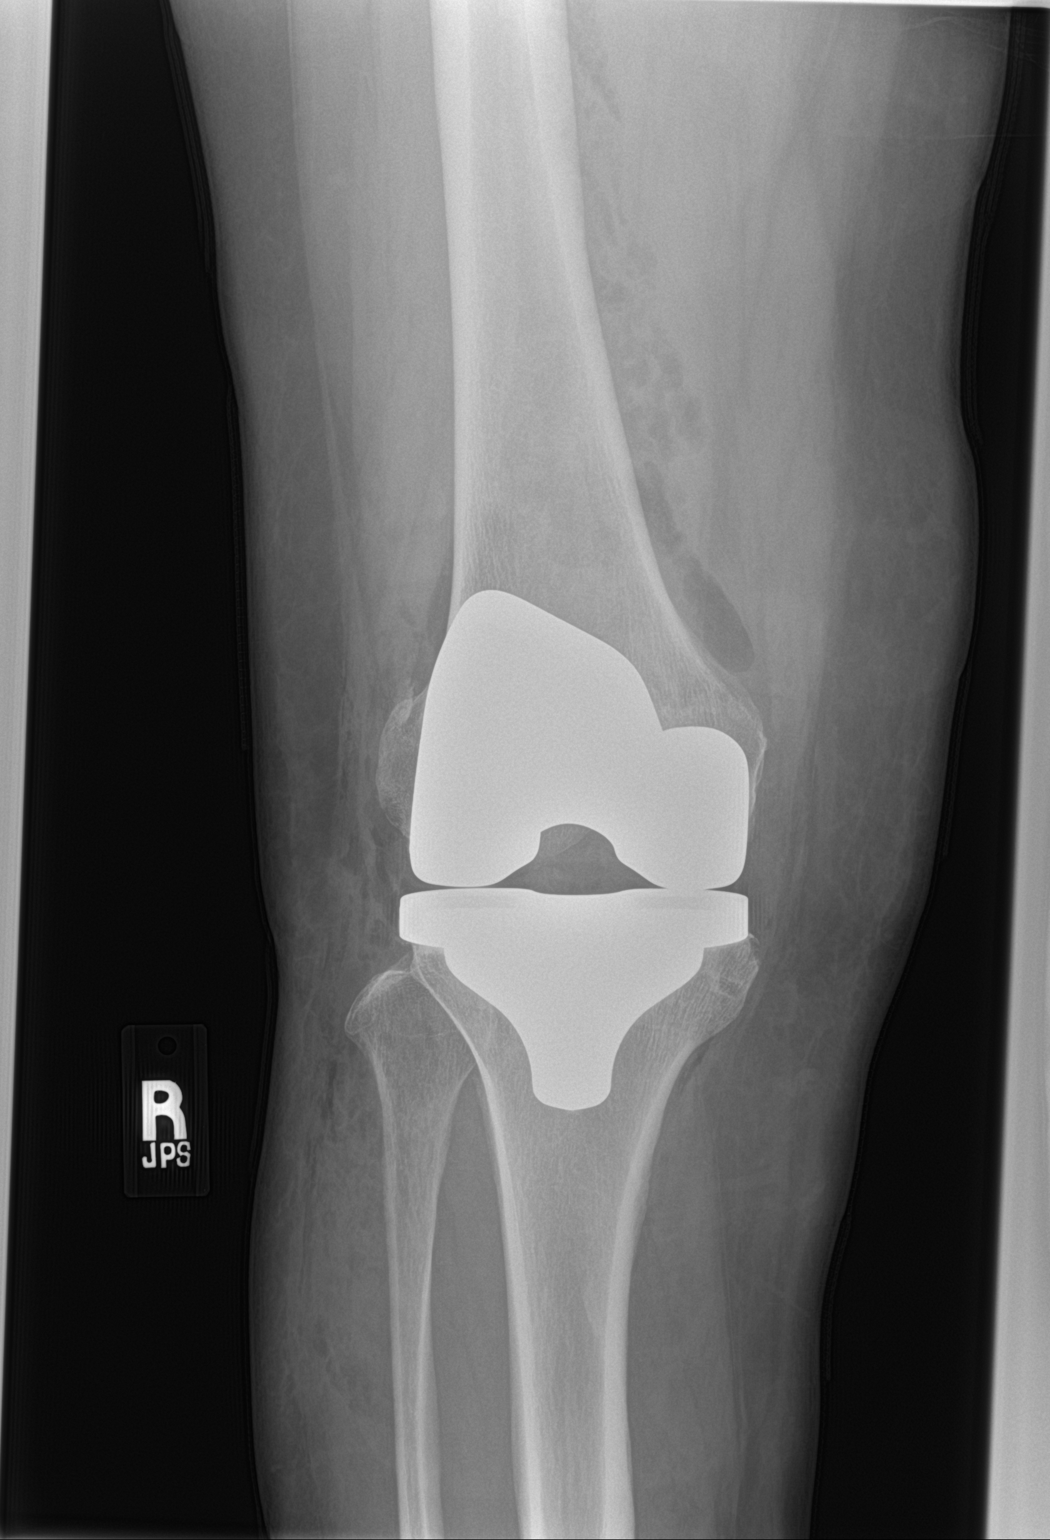
[im 2/2]
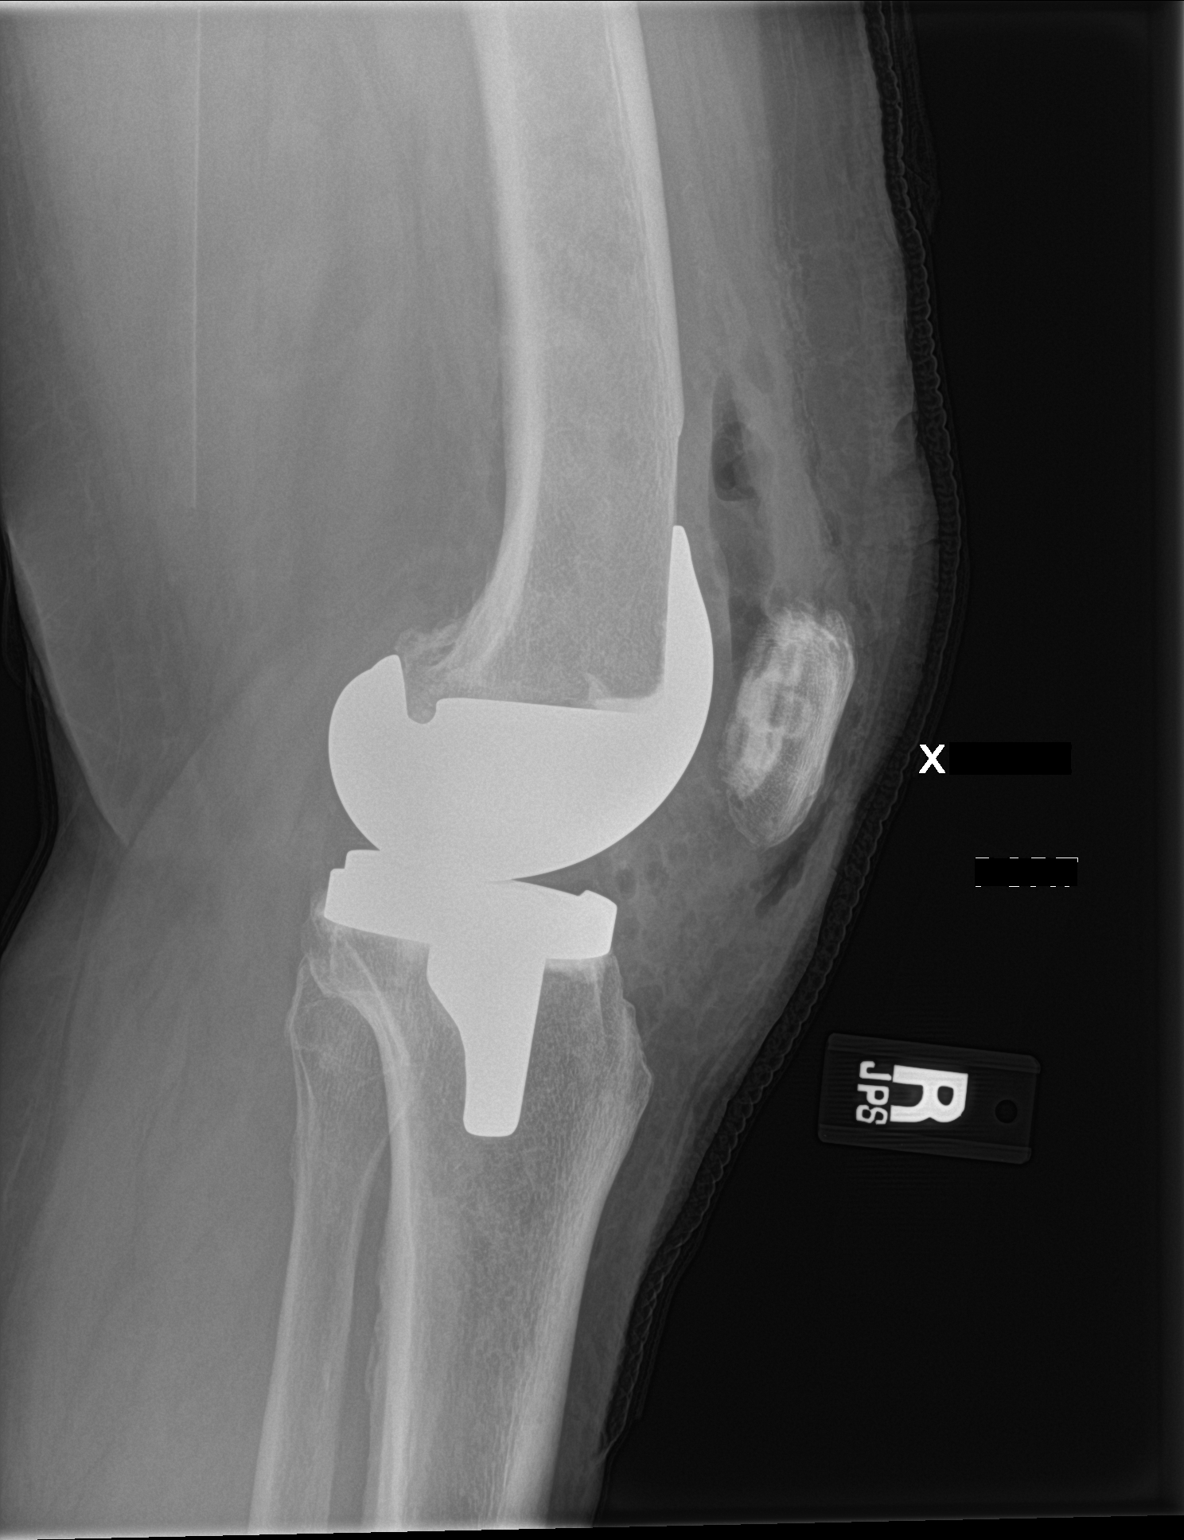

[2 of 2 positions shown; findings below may reference images not displayed]

FINDINGS: The right knee demonstrates a total knee arthroplasty without
evidence of hardware failure or complication. There is expected
intra-articular air. There is no fracture or dislocation. The
alignment is anatomic. Post-surgical changes noted in the
surrounding soft tissues.
IMPRESSION: 1. Interval right total knee arthroplasty without evidence of acute
postoperative complication.

## 2019-02-22 ENCOUNTER — Telehealth: Payer: Self-pay | Admitting: *Deleted

## 2019-02-22 NOTE — Telephone Encounter (Signed)
Ortho bundle 90 day call completed with survey.

## 2019-02-28 ENCOUNTER — Other Ambulatory Visit: Payer: Self-pay | Admitting: Physician Assistant

## 2019-03-05 ENCOUNTER — Ambulatory Visit: Payer: Medicare Other

## 2019-03-05 ENCOUNTER — Ambulatory Visit: Payer: Medicare Other | Attending: Internal Medicine

## 2019-03-05 DIAGNOSIS — Z23 Encounter for immunization: Secondary | ICD-10-CM | POA: Insufficient documentation

## 2019-03-05 NOTE — Progress Notes (Signed)
° °  Covid-19 Vaccination Clinic  Name:  Ariel Harris    MRN: [id number] DOB: 1945/04/12  03/05/2019  Ariel Harris was observed post Covid-19 immunization for 15 minutes without incidence. She was provided with Vaccine Information Sheet and instruction to access the V-Safe system.   Ariel Harris was instructed to call 911 with any severe reactions post vaccine:  Difficulty breathing   Swelling of your face and throat   A fast heartbeat   A bad rash all over your body   Dizziness and weakness    Immunizations Administered    Name Date Dose VIS Date Route   Pfizer COVID-19 Vaccine 03/05/2019  8:56 AM 0.3 mL 01/08/2019 Intramuscular   Manufacturer: [place]   Lot: [id number]   [place]: [id number]

## 2019-03-17 ENCOUNTER — Other Ambulatory Visit: Payer: Self-pay | Admitting: Registered Nurse

## 2019-03-17 DIAGNOSIS — L299 Pruritus, unspecified: Secondary | ICD-10-CM

## 2019-03-22 ENCOUNTER — Encounter: Payer: Self-pay | Admitting: Registered Nurse

## 2019-03-22 ENCOUNTER — Other Ambulatory Visit: Payer: Self-pay

## 2019-03-22 ENCOUNTER — Ambulatory Visit (INDEPENDENT_AMBULATORY_CARE_PROVIDER_SITE_OTHER): Payer: Medicare Other | Admitting: Registered Nurse

## 2019-03-22 VITALS — BP 139/82 | HR 66 | Temp 97.3°F | Ht 63.0 in | Wt 151.4 lb

## 2019-03-22 DIAGNOSIS — M25542 Pain in joints of left hand: Secondary | ICD-10-CM

## 2019-03-22 DIAGNOSIS — M810 Age-related osteoporosis without current pathological fracture: Secondary | ICD-10-CM | POA: Diagnosis not present

## 2019-03-22 DIAGNOSIS — M25541 Pain in joints of right hand: Secondary | ICD-10-CM | POA: Diagnosis not present

## 2019-03-22 DIAGNOSIS — E785 Hyperlipidemia, unspecified: Secondary | ICD-10-CM

## 2019-03-22 DIAGNOSIS — R202 Paresthesia of skin: Secondary | ICD-10-CM | POA: Diagnosis not present

## 2019-03-22 DIAGNOSIS — I10 Essential (primary) hypertension: Secondary | ICD-10-CM | POA: Diagnosis not present

## 2019-03-22 DIAGNOSIS — L299 Pruritus, unspecified: Secondary | ICD-10-CM | POA: Diagnosis not present

## 2019-03-22 MED ORDER — PRAVASTATIN SODIUM 40 MG PO TABS: 40.0000 mg | ORAL_TABLET | Freq: Every day | ORAL | 3 refills | Status: DC

## 2019-03-22 MED ORDER — METOPROLOL SUCCINATE ER 25 MG PO TB24: ORAL_TABLET | ORAL | 1 refills | Status: DC

## 2019-03-22 MED ORDER — BETAMETHASONE DIPROPIONATE 0.05 % EX CREA: TOPICAL_CREAM | Freq: Two times a day (BID) | CUTANEOUS | 0 refills | Status: DC

## 2019-03-22 NOTE — Progress Notes (Signed)
Established Patient Office Visit  Subjective:  Patient ID: Ariel Harris, female    DOB: [birth year]  Age: 74 y.o. MRN: [id number]  CC:  Chief Complaint  Patient presents with  . Follow-up    follow up on medication. also she has had some right shoulder pain for almost two months . She also stated she her finger tips have been going numb but not the thumbs,  . Medication Refill    metoprolol ,statin , betamethasone     HPI Ariel Harris presents for 6 week follow up  Has not been taking escitalopram at the behest of her family. States her anxiety and mood have been much improved regardless. Feeling better at this time and does not feel like she requires treatment for anxiety.  Notes that she is having some R shoulder pain. Has been mild and ongoing for around 2 months. No limitations in ROM. No radiation towards c spine or down arm. Located in R trapezius.   Having bilateral paresthesia in fingers and toes. Some tingling and numbness, no weakness.Having some joint pain in knuckles of both hands - worse in morning, worse with activity. The paresthesia has not happened before. Gradually worsening.   Past Medical History:  Diagnosis Date  . Arthritis    "knees" (05/16/2017)  . Chest pain   . Hyperlipidemia   . Hypertension   . PAC (premature atrial contraction)   . SVT (supraventricular tachycardia) (HCC)     Past Surgical History:  Procedure Laterality Date  . JOINT REPLACEMENT    . KNEE ARTHROSCOPY Left 1996  . KNEE SURGERY Right [phone]   "open surgery; because of the pain"  . 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 TOTAL KNEE ARTHROPLASTY Right 05/16/2017   Procedure: RIGHT TOTAL KNEE ARTHROPLASTY;  Surgeon: Leandrew Koyanagi, MD;  Location: [place];  Service: Orthopedics;  Laterality: Right;  . TOTAL KNEE ARTHROPLASTY Left 11/16/2018   Procedure: LEFT TOTAL KNEE ARTHROPLASTY;  Surgeon: Leandrew Koyanagi, MD;  Location: [place];  Service: Orthopedics;  Laterality: Left;     Family History  Problem Relation Age of Onset  . Coronary artery disease Neg Hx   . Heart attack Neg Hx   . Breast cancer Neg Hx     Social History   Socioeconomic History  . Marital status: Divorced    Spouse name: Not on file  . Number of children: 2  . Years of education: Not on file  . Highest education level: Not on file  Occupational History  . Not on file  Tobacco Use  . Smoking status: Never Smoker  . Smokeless tobacco: Never Used  Substance and Sexual Activity  . Alcohol use: No  . Drug use: No  . Sexual activity: Not Currently  Other Topics Concern  . Not on file  Social History Narrative   Negative family `history--Specifically artery disease or MI. The patient has two siblings died at a young age from fevers.   The patient is separated and single. She is retired but used to work at a [employer] and a [employer]. She is independent of daily living. She has never drink or smoke or done drugs   Social Determinants of Health   Financial Resource Strain:   . Difficulty of Paying Living Expenses: Not on file  Food Insecurity:   . Worried About Charity fundraiser in the Last Year: Not on file  . Ran Out of Food in the Last Year: Not on  file  Transportation Needs:   . Film/video editor (Medical): Not on file  . Lack of Transportation (Non-Medical): Not on file  Physical Activity:   . Days of Exercise per Week: Not on file  . Minutes of Exercise per Session: Not on file  Stress:   . Feeling of Stress : Not on file  Social Connections:   . Frequency of Communication with Friends and Family: Not on file  . Frequency of Social Gatherings with Friends and Family: Not on file  . Attends Religious Services: Not on file  . Active Member of Clubs or Organizations: Not on file  . Attends Archivist Meetings: Not on file  . Marital Status: Not on file  Intimate Partner Violence:   . Fear of Current or Ex-Partner: Not on file  . Emotionally Abused: Not  on file  . Physically Abused: Not on file  . Sexually Abused: Not on file    Outpatient Medications Prior to Visit  Medication Sig Dispense Refill  . aspirin 81 MG EC tablet TAKE 1 TABLET (81 MG TOTAL) BY MOUTH 2 (TWO) TIMES DAILY. 84 tablet 0  . calcium-vitamin D (OSCAL WITH D) 500-200 MG-UNIT tablet Take 1 tablet by mouth daily with breakfast.    . escitalopram (LEXAPRO) 10 MG tablet Take 1 tablet (10 mg total) by mouth daily. 90 tablet 0  . hydrOXYzine (ATARAX/VISTARIL) 10 MG tablet Take 1 tablet (10 mg total) by mouth every evening. May take 1 additional tablet if needed to reach desired effect. 90 tablet 0  . betamethasone dipropionate 0.05 % cream Apply topically 2 (two) times daily. 30 g 0  . metoprolol succinate (TOPROL XL) 25 MG 24 hr tablet Take 1 tablet by mouth daily, may take 1 extra tablet by mouth daily only as needed for palpitations 150 tablet 1  . pravastatin (PRAVACHOL) 40 MG tablet Take 1 tablet (40 mg total) by mouth daily. 90 tablet 3  . ondansetron (ZOFRAN) 4 MG tablet Take 1-2 tablets (4-8 mg total) by mouth every 8 (eight) hours as needed for nausea or vomiting. (Patient not taking: Reported on 01/08/2019) 40 tablet 0   No facility-administered medications prior to visit.    Allergies  Allergen Reactions  . Indomethacin Swelling  . Tramadol     Sweaty, made feel bad    ROS Review of Systems  Constitutional: Negative.   HENT: Negative.   Eyes: Negative.   Respiratory: Negative.   Cardiovascular: Negative.   Gastrointestinal: Negative.   Endocrine: Negative.   Genitourinary: Negative.   Musculoskeletal: Negative.   Skin: Negative.   Allergic/Immunologic: Negative.   Neurological: Negative.   Hematological: Negative.   Psychiatric/Behavioral: Negative.   All other systems reviewed and are negative.     Objective:    Physical Exam  Constitutional: She is oriented to person, place, and time. She appears well-developed and well-nourished. No  distress.  Cardiovascular: Normal rate and regular rhythm.  Pulmonary/Chest: Effort normal. No respiratory distress.  Musculoskeletal:        General: Tenderness (mild R trapezius) present. No edema. Normal range of motion.     Cervical back: Normal range of motion.  Neurological: She is alert and oriented to person, place, and time. No cranial nerve deficit.  Skin: Skin is warm and dry. No rash noted. She is not diaphoretic. No erythema. No pallor.  Psychiatric: She has a normal mood and affect. Her behavior is normal. Judgment and thought content normal.  Nursing note and vitals reviewed.  BP 139/82   Pulse 66   Temp (!) 97.3 F (36.3 C) (Temporal)   Ht 5\' 3"  (1.6 m)   Wt 151 lb 6.4 oz (68.7 kg)   SpO2 99%   BMI 26.82 kg/m  Wt Readings from Last 3 Encounters:  03/22/19 151 lb 6.4 oz (68.7 kg)  02/08/19 147 lb (66.7 kg)  01/08/19 148 lb (67.1 kg)     Health Maintenance Due  Topic Date Due  . Hepatitis C Screening  1945/12/19    There are no preventive care reminders to display for this patient.  Lab Results  Component Value Date   TSH 0.65 12/09/2012   Lab Results  Component Value Date   WBC 3.5 01/08/2019   HGB 13.7 01/08/2019   HCT 42.0 01/08/2019   MCV 88 01/08/2019   PLT 235 01/08/2019   Lab Results  Component Value Date   NA 138 01/08/2019   K 3.8 01/08/2019   CO2 23 01/08/2019   GLUCOSE 107 (H) 01/08/2019   BUN 13 01/08/2019   CREATININE 0.85 01/08/2019   BILITOT 0.8 01/08/2019   ALKPHOS 48 01/08/2019   AST 19 01/08/2019   ALT 9 01/08/2019   PROT 7.6 01/08/2019   ALBUMIN 4.0 01/08/2019   CALCIUM 9.4 01/08/2019   ANIONGAP 6 11/17/2018   GFR 71.18 12/09/2012   Lab Results  Component Value Date   CHOL 182 01/08/2019   Lab Results  Component Value Date   HDL 62 01/08/2019   Lab Results  Component Value Date   LDLCALC 108 (H) 01/08/2019   Lab Results  Component Value Date   TRIG 65 01/08/2019   Lab Results  Component Value Date    CHOLHDL 2.9 01/08/2019   Lab Results  Component Value Date   HGBA1C (H) 11/23/2009    6.0 (NOTE)                                                                       According to the ADA Clinical Practice Recommendations for 2011, when HbA1c is used as a screening test:   >=6.5%   Diagnostic of Diabetes Mellitus           (if abnormal result  is confirmed)  5.7-6.4%   Increased risk of developing Diabetes Mellitus  References:Diagnosis and Classification of Diabetes Mellitus,Diabetes [id number] 1):S62-S69 and Standards of Medical Care in         Diabetes - 2011,Diabetes Care,2011,34  (Suppl 1):S11-S61.      Assessment & Plan:   Problem List Items Addressed This Visit      Cardiovascular and Mediastinum   Hypertension   Relevant Medications   metoprolol succinate (TOPROL XL) 25 MG 24 hr tablet   pravastatin (PRAVACHOL) 40 MG tablet     Musculoskeletal and Integument   Osteoporosis   Relevant Orders   Vitamin D, 25-hydroxy     Other   Dyslipidemia   Relevant Medications   pravastatin (PRAVACHOL) 40 MG tablet   Itching   Relevant Medications   betamethasone dipropionate 0.05 % cream   Joint pain in both hands   Relevant Orders   ANA   Vitamin D, 25-hydroxy   Vitamin B12   Paresthesia - Primary   Relevant Orders  Vitamin D, 25-hydroxy   Vitamin B12      Meds ordered this encounter  Medications  . betamethasone dipropionate 0.05 % cream    Sig: Apply topically 2 (two) times daily.    Dispense:  30 g    Refill:  0  . metoprolol succinate (TOPROL XL) 25 MG 24 hr tablet    Sig: Take 1 tablet by mouth daily, may take 1 extra tablet by mouth daily only as needed for palpitations    Dispense:  150 tablet    Refill:  1  . pravastatin (PRAVACHOL) 40 MG tablet    Sig: Take 1 tablet (40 mg total) by mouth daily.    Dispense:  90 tablet    Refill:  3    Follow-up: No follow-ups on file.   PLAN  Trapezius strain: meloxicam, reviewed stretching. Next step  would be PT referral  Refilled betamethasone, metoprolol, and pravastatin  Labs drawn for paresthesia and hand pain: ANA, vit d, b12.   Patient encouraged to call clinic with any questions, comments, or concerns.  Maximiano Coss, NP

## 2019-03-22 NOTE — Patient Instructions (Addendum)
    I am glad that you are feeling better. I have sent over medication refills of the medications that you have requested. In addition, I have two new ones:  Meloxicam: take one tablet each day when you have shoulder pain. If your shoulders are feeling good, you do not need to take this medication.  Benadryl: This is a medication you can buy over the counter. If you eat something that makes you itchy, take the recommended dose of benadryl per package instructions. This can help your itching, but may make you sleepy.  We have drawn labs today to check on your Vitamin D, Vitamin B12, and ANA. Once these are back, they will help guide our treatment for your numbness in your fingertips.  Please reach out with any questions.    Ti r?t vui v b?n ?ang c?m th?y t?t h?n. Ti ? g?i qua cc l?n n?p thu?c c?a cc lo?i thu?c b?n ? yu c?u. Ngoi ra, ti c hai ci m?i:  Meloxicam: u?ng m?t vin m?i ngy khi b?n b? ?au vai. N?u vai c?a b?n c?m th?y t?t, b?n khng c?n dng thu?c ny.  Benadryl: ?y l lo?i thu?c b?n c th? mua khng c?n k ??n. N?u b?n ?n ph?i th? g ? khi?n b?n b? ng?a, hy dng theo li?u l??ng ???c khuy?n co c?a benadryl theo h??ng d?n trn bao b. ?i?u ny c th? gip b?n ?? ng?a nh?ng c th? khi?n b?n bu?n ng?.  Hm nay chng ti ? t? ch?c cc phng th nghi?m ?? ki?m tra Vitamin D, Vitamin B12 v ANA c?a b?n. Khi chng Perine?t ??ng tr? l?i, chng s? gip h??ng d?n cch ?i?u tr? c?a chng ti ??i v?i ch?ng t ??u ngn tay c?a b?n.  Vui lng lin h? v?i b?t k? cu h?i no.  If you have lab work done today you will be contacted with your lab results within the next 2 weeks.  If you have not heard from [place] then please contact us. The fastest way to get your results is to register for My Chart.   IF you received an x-ray today, you will receive an invoice from [facility]. Please contact [facility] at [phone] with questions or concerns regarding your invoice.    IF you received labwork today, you will receive an invoice from [place]. Please contact [facility] at [phone] with questions or concerns regarding your invoice.   Our billing staff will not be able to assist you with questions regarding bills from these companies.  You will be contacted with the lab results as soon as they are available. The fastest way to get your results is to activate your My Chart account. Instructions are located on the last page of this paperwork. If you have not heard from [place] regarding the results in 2 weeks, please contact this office.

## 2019-03-23 ENCOUNTER — Other Ambulatory Visit: Payer: Self-pay | Admitting: Registered Nurse

## 2019-03-23 DIAGNOSIS — E559 Vitamin D deficiency, unspecified: Secondary | ICD-10-CM

## 2019-03-23 LAB — VITAMIN D 25 HYDROXY (VIT D DEFICIENCY, FRACTURES): Vit D, 25-Hydroxy: 19 ng/mL — ABNORMAL LOW (ref 30.0–100.0)

## 2019-03-23 LAB — VITAMIN B12: Vitamin B-12: 461 pg/mL (ref 232–1245)

## 2019-03-23 LAB — ANA: Anti Nuclear Antibody (ANA): NEGATIVE

## 2019-03-23 MED ORDER — VITAMIN D (ERGOCALCIFEROL) 1.25 MG (50000 UNIT) PO CAPS: 50000.0000 [IU] | ORAL_CAPSULE | ORAL | 0 refills | Status: DC

## 2019-03-23 NOTE — Progress Notes (Signed)
Good afternoon,  Pt needs a Vitamin D supplement - I will call in one for her to take once weekly for the next six weeks. After that, she should start a daily OTC supplement. I think this will help with her symptoms  Thank you  Kathrin Ruddy, NP

## 2019-03-30 ENCOUNTER — Ambulatory Visit: Payer: Medicare Other | Attending: Internal Medicine

## 2019-03-30 DIAGNOSIS — Z23 Encounter for immunization: Secondary | ICD-10-CM | POA: Insufficient documentation

## 2019-03-30 NOTE — Progress Notes (Signed)
   Covid-19 Vaccination Clinic  Name:  Ariel Harris    MRN: [id number] DOB: [birth year]  03/30/2019  [name] was observed post Covid-19 immunization for 15 minutes without incident. She was provided with Vaccine Information Sheet and instruction to access the V-Safe system.   [name] was instructed to call 911 with any severe reactions post vaccine: Marland Kitchen Difficulty breathing  . Swelling of face and throat  . A fast heartbeat  . A bad rash all over body  . Dizziness and weakness   Immunizations Administered    Name Date Dose VIS Date Route   Pfizer COVID-19 Vaccine 03/30/2019  8:36 AM 0.3 mL 01/08/2019 Intramuscular   Manufacturer: [place]   Lot: [id number]   [place]: [id number]

## 2019-04-12 ENCOUNTER — Encounter: Payer: Self-pay | Admitting: Registered Nurse

## 2019-04-12 ENCOUNTER — Other Ambulatory Visit: Payer: Self-pay

## 2019-04-12 ENCOUNTER — Ambulatory Visit (INDEPENDENT_AMBULATORY_CARE_PROVIDER_SITE_OTHER): Payer: Medicare Other | Admitting: Registered Nurse

## 2019-04-12 VITALS — BP 157/90 | HR 67 | Temp 97.7°F | Ht 63.0 in | Wt 150.0 lb

## 2019-04-12 DIAGNOSIS — R300 Dysuria: Secondary | ICD-10-CM

## 2019-04-12 DIAGNOSIS — S46811A Strain of other muscles, fascia and tendons at shoulder and upper arm level, right arm, initial encounter: Secondary | ICD-10-CM | POA: Diagnosis not present

## 2019-04-12 DIAGNOSIS — N3000 Acute cystitis without hematuria: Secondary | ICD-10-CM

## 2019-04-12 DIAGNOSIS — K30 Functional dyspepsia: Secondary | ICD-10-CM | POA: Diagnosis not present

## 2019-04-12 LAB — POCT URINALYSIS DIP (CLINITEK)
Bilirubin, UA: NEGATIVE
Blood, UA: NEGATIVE
Glucose, UA: NEGATIVE mg/dL
Ketones, POC UA: NEGATIVE mg/dL
Nitrite, UA: NEGATIVE
POC PROTEIN,UA: NEGATIVE
Spec Grav, UA: 1.015 (ref 1.010–1.025)
Urobilinogen, UA: 0.2 E.U./dL
pH, UA: 7 (ref 5.0–8.0)

## 2019-04-12 MED ORDER — OMEPRAZOLE 20 MG PO CPDR: 20.0000 mg | DELAYED_RELEASE_CAPSULE | Freq: Every day | ORAL | 3 refills | Status: DC

## 2019-04-12 MED ORDER — PSYLLIUM HUSK 100 % PO POWD: 1.0000 | Freq: Every day | ORAL | 0 refills | Status: DC

## 2019-04-12 MED ORDER — SULFAMETHOXAZOLE-TRIMETHOPRIM 800-160 MG PO TABS: 1.0000 | ORAL_TABLET | Freq: Two times a day (BID) | ORAL | 0 refills | Status: DC

## 2019-04-12 MED ORDER — METHOCARBAMOL 500 MG PO TABS: 500.0000 mg | ORAL_TABLET | Freq: Two times a day (BID) | ORAL | 0 refills | Status: DC | PRN

## 2019-04-12 NOTE — Progress Notes (Signed)
Established Patient Office Visit  Subjective:  Patient ID: Ariel Harris, female    DOB: 09-12-1945  Age: 74 y.o. MRN: [id number]  CC:  Chief Complaint  Patient presents with  . Gastroesophageal Reflux    patient states everytime she eats she has discomfort in her belly for about three days and keep burping. per patient she hasnt been taking anything for pain    HPI Ariel Harris presents for indigestion, dysuria, and msk pain  Indigestion: Notes that after eating she has discomfort with bloating and burping. Has been going on for a few days. Not worsening of better. Some epigastric pain. Reports she is up to date on colon cancer screening. Bowel movements wnl  Dysuria: ongoing for around 1 week. Suprapubic pain. No vaginal symptoms. Has not tried treatment. No new sexual partners. No flank pain. No fevers, chills, fatigue, nvd.  MSK pain: tightness in R trapezius. Has occurred before - was given short course of muscle relaxers and isntructions to stretch - interested in pursuing this again.  Past Medical History:  Diagnosis Date  . Arthritis    "knees" (05/16/2017)  . Chest pain   . Hyperlipidemia   . Hypertension   . PAC (premature atrial contraction)   . SVT (supraventricular tachycardia) (HCC)     Past Surgical History:  Procedure Laterality Date  . JOINT REPLACEMENT    . KNEE ARTHROSCOPY Left 1996  . KNEE SURGERY Right [phone]   "open surgery; because of the pain"  . 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 TOTAL KNEE ARTHROPLASTY Right 05/16/2017   Procedure: RIGHT TOTAL KNEE ARTHROPLASTY;  Surgeon: Leandrew Koyanagi, MD;  Location: [place];  Service: Orthopedics;  Laterality: Right;  . TOTAL KNEE ARTHROPLASTY Left 11/16/2018   Procedure: LEFT TOTAL KNEE ARTHROPLASTY;  Surgeon: Leandrew Koyanagi, MD;  Location: [place];  Service: Orthopedics;  Laterality: Left;    Family History  Problem Relation Age of Onset  . Coronary artery disease Neg Hx   . Heart attack Neg  Hx   . Breast cancer Neg Hx     Social History   Socioeconomic History  . Marital status: Divorced    Spouse name: Not on file  . Number of children: 2  . Years of education: Not on file  . Highest education level: Not on file  Occupational History  . Not on file  Tobacco Use  . Smoking status: Never Smoker  . Smokeless tobacco: Never Used  Substance and Sexual Activity  . Alcohol use: No  . Drug use: No  . Sexual activity: Not Currently  Other Topics Concern  . Not on file  Social History Narrative   Negative family `history--Specifically artery disease or MI. The patient has two siblings died at a young age from fevers.   The patient is separated and single. She is retired but used to work at a [employer] and a [employer]. She is independent of daily living. She has never drink or smoke or done drugs   Social Determinants of Health   Financial Resource Strain:   . Difficulty of Paying Living Expenses:   Food Insecurity:   . Worried About Charity fundraiser in the Last Year:   . Arboriculturist in the Last Year:   Transportation Needs:   . Film/video editor (Medical):   Marland Kitchen Lack of Transportation (Non-Medical):   Physical Activity:   . Days of Exercise per Week:   . Minutes of  Exercise per Session:   Stress:   . Feeling of Stress :   Social Connections:   . Frequency of Communication with Friends and Family:   . Frequency of Social Gatherings with Friends and Family:   . Attends Religious Services:   . Active Member of Clubs or Organizations:   . Attends Archivist Meetings:   Marland Kitchen Marital Status:   Intimate Partner Violence:   . Fear of Current or Ex-Partner:   . Emotionally Abused:   Marland Kitchen Physically Abused:   . Sexually Abused:     Outpatient Medications Prior to Visit  Medication Sig Dispense Refill  . betamethasone dipropionate 0.05 % cream Apply topically 2 (two) times daily. 30 g 0  . calcium-vitamin D (OSCAL WITH D) 500-200 MG-UNIT tablet Take 1  tablet by mouth daily with breakfast.    . escitalopram (LEXAPRO) 10 MG tablet Take 1 tablet (10 mg total) by mouth daily. 90 tablet 0  . metoprolol succinate (TOPROL XL) 25 MG 24 hr tablet Take 1 tablet by mouth daily, may take 1 extra tablet by mouth daily only as needed for palpitations 150 tablet 1  . pravastatin (PRAVACHOL) 40 MG tablet Take 1 tablet (40 mg total) by mouth daily. 90 tablet 3  . Vitamin D, Ergocalciferol, (DRISDOL) 1.25 MG (50000 UNIT) CAPS capsule Take 1 capsule (50,000 Units total) by mouth every 7 (seven) days. 6 capsule 0  . aspirin 81 MG EC tablet TAKE 1 TABLET (81 MG TOTAL) BY MOUTH 2 (TWO) TIMES DAILY. (Patient not taking: Reported on 04/12/2019) 84 tablet 0  . hydrOXYzine (ATARAX/VISTARIL) 10 MG tablet Take 1 tablet (10 mg total) by mouth every evening. May take 1 additional tablet if needed to reach desired effect. (Patient not taking: Reported on 04/12/2019) 90 tablet 0  . ondansetron (ZOFRAN) 4 MG tablet Take 1-2 tablets (4-8 mg total) by mouth every 8 (eight) hours as needed for nausea or vomiting. (Patient not taking: Reported on 01/08/2019) 40 tablet 0   No facility-administered medications prior to visit.    Allergies  Allergen Reactions  . Indomethacin Swelling  . Tramadol     Sweaty, made feel bad    ROS Review of Systems Per hpi    Objective:    Physical Exam  Constitutional: She appears well-developed and well-nourished. No distress.  HENT:  Head: Normocephalic and atraumatic.  Cardiovascular: Normal rate and regular rhythm.  Pulmonary/Chest: Effort normal. No respiratory distress.  Abdominal: Soft. Bowel sounds are normal. She exhibits no distension and no mass. There is abdominal tenderness (suprapubic, mild epigastric). There is no rebound and no guarding.  Neurological: She is alert.  Skin: Skin is warm and dry. No rash noted. She is not diaphoretic. No erythema. No pallor.  Psychiatric: She has a normal mood and affect. Her behavior is  normal. Judgment and thought content normal.  Nursing note and vitals reviewed.   BP (!) 157/90   Pulse 67   Temp 97.7 F (36.5 C) (Temporal)   Ht 5\' 3"  (1.6 m)   Wt 150 lb (68 kg)   SpO2 96%   BMI 26.57 kg/m  Wt Readings from Last 3 Encounters:  04/12/19 150 lb (68 kg)  03/22/19 151 lb 6.4 oz (68.7 kg)  02/08/19 147 lb (66.7 kg)     Health Maintenance Due  Topic Date Due  . Hepatitis C Screening  Never done    There are no preventive care reminders to display for this patient.  Lab Results  Component Value Date   TSH 0.65 12/09/2012   Lab Results  Component Value Date   WBC 3.5 01/08/2019   HGB 13.7 01/08/2019   HCT 42.0 01/08/2019   MCV 88 01/08/2019   PLT 235 01/08/2019   Lab Results  Component Value Date   NA 138 01/08/2019   K 3.8 01/08/2019   CO2 23 01/08/2019   GLUCOSE 107 (H) 01/08/2019   BUN 13 01/08/2019   CREATININE 0.85 01/08/2019   BILITOT 0.8 01/08/2019   ALKPHOS 48 01/08/2019   AST 19 01/08/2019   ALT 9 01/08/2019   PROT 7.6 01/08/2019   ALBUMIN 4.0 01/08/2019   CALCIUM 9.4 01/08/2019   ANIONGAP 6 11/17/2018   GFR 71.18 12/09/2012   Lab Results  Component Value Date   CHOL 182 01/08/2019   Lab Results  Component Value Date   HDL 62 01/08/2019   Lab Results  Component Value Date   LDLCALC 108 (H) 01/08/2019   Lab Results  Component Value Date   TRIG 65 01/08/2019   Lab Results  Component Value Date   CHOLHDL 2.9 01/08/2019   Lab Results  Component Value Date   HGBA1C (H) 11/23/2009    6.0 (NOTE)                                                                       According to the ADA Clinical Practice Recommendations for 2011, when HbA1c is used as a screening test:   >=6.5%   Diagnostic of Diabetes Mellitus           (if abnormal result  is confirmed)  5.7-6.4%   Increased risk of developing Diabetes Mellitus  References:Diagnosis and Classification of Diabetes Mellitus,Diabetes D8842878 1):S62-S69 and  Standards of Medical Care in         Diabetes - 2011,Diabetes Care,2011,34  (Suppl 1):S11-S61.      Assessment & Plan:   Problem List Items Addressed This Visit    None    Visit Diagnoses    Dysuria    -  Primary   Relevant Orders   POCT URINALYSIS DIP (CLINITEK) (Completed)   Indigestion       Relevant Medications   omeprazole (PRILOSEC) 20 MG capsule   Psyllium Husk 100 % POWD   Strain of right trapezius muscle, initial encounter       Relevant Medications   methocarbamol (ROBAXIN) 500 MG tablet   Acute cystitis without hematuria       Relevant Medications   sulfamethoxazole-trimethoprim (BACTRIM DS) 800-160 MG tablet      Meds ordered this encounter  Medications  . omeprazole (PRILOSEC) 20 MG capsule    Sig: Take 1 capsule (20 mg total) by mouth daily.    Dispense:  30 capsule    Refill:  3    Order Specific Question:   Supervising Provider    Answer:   Delia Chimes A [id number]  . Psyllium Husk 100 % POWD    Sig: Take 1 Scoop by mouth daily with breakfast.    Dispense:  300 g    Refill:  0    Order Specific Question:   Supervising Provider    Answer:   Delia Chimes A [id number]  . methocarbamol (ROBAXIN) 500  MG tablet    Sig: Take 1 tablet (500 mg total) by mouth 2 (two) times daily as needed for muscle spasms.    Dispense:  60 tablet    Refill:  0    Order Specific Question:   Supervising Provider    Answer:   Delia Chimes A [id number]  . sulfamethoxazole-trimethoprim (BACTRIM DS) 800-160 MG tablet    Sig: Take 1 tablet by mouth 2 (two) times daily.    Dispense:  6 tablet    Refill:  0    Order Specific Question:   Supervising Provider    Answer:   Forrest Moron [id number]    Follow-up: No follow-ups on file.   PLAN  Apparent UTI - will treat with bactrim DS po bid for 3 days. Suggest increase water intake, azo if desired.  R trapezius strain: robaxin 500mg  PO bid PRN, stretch, rest.  Indigestion: sounds like GERD - will give omeprazole 20mg  PO  qd before breakfast and psyllium fiber supplement. Red flags and reasons to return to clinic reviewed  Patient encouraged to call clinic with any questions, comments, or concerns.  Maximiano Coss, NP

## 2019-04-12 NOTE — Patient Instructions (Signed)
° ° ° °  If you have lab work done today you will be contacted with your lab results within the next 2 weeks.  If you have not heard from us then please contact us. The fastest way to get your results is to register for My Chart. ° ° °IF you received an x-ray today, you will receive an invoice from [facility]. Please contact [facility] at [phone] with questions or concerns regarding your invoice.  ° °IF you received labwork today, you will receive an invoice from [facility]. Please contact [facility] at [phone] with questions or concerns regarding your invoice.  ° °Our billing staff will not be able to assist you with questions regarding bills from these companies. ° °You will be contacted with the lab results as soon as they are available. The fastest way to get your results is to activate your My Chart account. Instructions are located on the last page of this paperwork. If you have not heard from us regarding the results in 2 weeks, please contact this office. °  ° ° ° °

## 2019-04-14 ENCOUNTER — Ambulatory Visit: Payer: Medicare Other | Admitting: Student

## 2019-04-26 ENCOUNTER — Other Ambulatory Visit: Payer: Self-pay | Admitting: Registered Nurse

## 2019-04-26 DIAGNOSIS — E559 Vitamin D deficiency, unspecified: Secondary | ICD-10-CM

## 2019-04-26 NOTE — Telephone Encounter (Signed)
Me and the Interpreter could not get the patient on the phone the two times that she tried, and patient has no voicemail setup yet.Marland Kitchen

## 2019-04-26 NOTE — Telephone Encounter (Signed)
Pt needs to return for Vit D testing as this rx may not be warranted.  Order has been placed, pt can come at her convenience.  Thank you,  Kathrin Ruddy, NP

## 2019-04-26 NOTE — Telephone Encounter (Signed)
Patient is requesting a refill of the following medications: Requested Prescriptions   Pending Prescriptions Disp Refills  . Vitamin D, Ergocalciferol, (DRISDOL) 1.25 MG (50000 UNIT) CAPS capsule [Pharmacy Med Name: VITAMIN D2 1.25MG (50,000 UNIT)] 6 capsule 0    Sig: TAKE 1 CAPSULE (50,000 UNITS TOTAL) BY MOUTH EVERY 7 (SEVEN) DAYS.    Date of patient request: 04/26/2019 Last office visit: 04/12/2019 Date of last refill: 03/23/2019 Last refill amount:6 capsules  Follow up time period per chart: follow up scheduled

## 2019-04-30 ENCOUNTER — Ambulatory Visit: Payer: Self-pay | Admitting: Orthopaedic Surgery

## 2019-05-02 ENCOUNTER — Other Ambulatory Visit: Payer: Self-pay | Admitting: Registered Nurse

## 2019-05-02 DIAGNOSIS — F419 Anxiety disorder, unspecified: Secondary | ICD-10-CM

## 2019-05-02 DIAGNOSIS — F329 Major depressive disorder, single episode, unspecified: Secondary | ICD-10-CM

## 2019-05-14 ENCOUNTER — Other Ambulatory Visit: Payer: Self-pay | Admitting: Registered Nurse

## 2019-05-14 DIAGNOSIS — S46811A Strain of other muscles, fascia and tendons at shoulder and upper arm level, right arm, initial encounter: Secondary | ICD-10-CM

## 2019-05-17 NOTE — Telephone Encounter (Signed)
Pain medication refill request, please advise if it is okay. Rx was sent in on 04/12/19 which was the same day as her appointment.

## 2019-06-02 NOTE — Progress Notes (Deleted)
=-  No chief complaint on file.  History of Present Illness: 74 yo [place] female with history of HTN, HLD, SVT and PACs here today for cardiac follow up.  She was admitted October 2011 to [facility] with chest pain and ruled out with serial enzymes. She has had c/o palpitations over the years. 48 hour Holter monitor showed sinus rhythm with PACs and shorts runs of SVT. I started Toprol. She has has intermittent chest pains over the years with normal stress tests in 2013 and 2015. She was seen in the ED at [facility] June 2017 with chest pain. Troponin was negative. EKG without ischemic changes. Her pain was felt to be GI related.  She was seen again in the [facility] ED February 2018 with chest pain. Troponin and EKG ok.   She is here today for follow up. The patient denies any chest pain, dyspnea, palpitations, lower extremity edema, orthopnea, PND, dizziness, near syncope or syncope.   Primary Care Physician: Antonietta Jewel, MD   Past Medical History:  Diagnosis Date  . Arthritis    "knees" (05/16/2017)  . Chest pain   . Hyperlipidemia   . Hypertension   . PAC (premature atrial contraction)   . SVT (supraventricular tachycardia) (HCC)     Past Surgical History:  Procedure Laterality Date  . JOINT REPLACEMENT    . KNEE ARTHROSCOPY Left 1996  . KNEE SURGERY Right [phone]   "open surgery; because of the pain"  . 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 TOTAL KNEE ARTHROPLASTY Right 05/16/2017   Procedure: RIGHT TOTAL KNEE ARTHROPLASTY;  Surgeon: Leandrew Koyanagi, MD;  Location: [place];  Service: Orthopedics;  Laterality: Right;  . TOTAL KNEE ARTHROPLASTY Left 11/16/2018   Procedure: LEFT TOTAL KNEE ARTHROPLASTY;  Surgeon: Leandrew Koyanagi, MD;  Location: [place];  Service: Orthopedics;  Laterality: Left;    Current Outpatient Medications  Medication Sig Dispense Refill  . aspirin 81 MG EC tablet TAKE 1 TABLET (81 MG TOTAL) BY MOUTH 2 (TWO) TIMES DAILY. (Patient not taking: Reported on  04/12/2019) 84 tablet 0  . betamethasone dipropionate 0.05 % cream Apply topically 2 (two) times daily. 30 g 0  . calcium-vitamin D (OSCAL WITH D) 500-200 MG-UNIT tablet Take 1 tablet by mouth daily with breakfast.    . escitalopram (LEXAPRO) 10 MG tablet TAKE 1 TABLET BY MOUTH EVERY DAY 90 tablet 0  . hydrOXYzine (ATARAX/VISTARIL) 10 MG tablet Take 1 tablet (10 mg total) by mouth every evening. May take 1 additional tablet if needed to reach desired effect. (Patient not taking: Reported on 04/12/2019) 90 tablet 0  . methocarbamol (ROBAXIN) 500 MG tablet TAKE 1 TABLET (500 MG TOTAL) BY MOUTH 2 (TWO) TIMES DAILY AS NEEDED FOR MUSCLE SPASMS. 60 tablet 0  . metoprolol succinate (TOPROL XL) 25 MG 24 hr tablet Take 1 tablet by mouth daily, may take 1 extra tablet by mouth daily only as needed for palpitations 150 tablet 1  . omeprazole (PRILOSEC) 20 MG capsule Take 1 capsule (20 mg total) by mouth daily. 30 capsule 3  . ondansetron (ZOFRAN) 4 MG tablet Take 1-2 tablets (4-8 mg total) by mouth every 8 (eight) hours as needed for nausea or vomiting. (Patient not taking: Reported on 01/08/2019) 40 tablet 0  . pravastatin (PRAVACHOL) 40 MG tablet Take 1 tablet (40 mg total) by mouth daily. 90 tablet 3  . Psyllium Husk 100 % POWD Take 1 Scoop by mouth daily with breakfast. 300 g 0  . sulfamethoxazole-trimethoprim (  BACTRIM DS) 800-160 MG tablet Take 1 tablet by mouth 2 (two) times daily. 6 tablet 0  . Vitamin D, Ergocalciferol, (DRISDOL) 1.25 MG (50000 UNIT) CAPS capsule Take 1 capsule (50,000 Units total) by mouth every 7 (seven) days. 6 capsule 0   No current facility-administered medications for this visit.    Allergies  Allergen Reactions  . Indomethacin Swelling  . Tramadol     Sweaty, made feel bad    Social History   Socioeconomic History  . Marital status: Divorced    Spouse name: Not on file  . Number of children: 2  . Years of education: Not on file  . Highest education level: Not on file   Occupational History  . Not on file  Tobacco Use  . Smoking status: Never Smoker  . Smokeless tobacco: Never Used  Substance and Sexual Activity  . Alcohol use: No  . Drug use: No  . Sexual activity: Not Currently  Other Topics Concern  . Not on file  Social History Narrative   Negative family `history--Specifically artery disease or MI. The patient has two siblings died at a young age from fevers.   The patient is separated and single. She is retired but used to work at a [employer] and a [place]. She is independent of daily living. She has never drink or smoke or done drugs   Social Determinants of Health   Financial Resource Strain:   . Difficulty of Paying Living Expenses:   Food Insecurity:   . Worried About Charity fundraiser in the Last Year:   . Arboriculturist in the Last Year:   Transportation Needs:   . Film/video editor (Medical):   Marland Kitchen Lack of Transportation (Non-Medical):   Physical Activity:   . Days of Exercise per Week:   . Minutes of Exercise per Session:   Stress:   . Feeling of Stress :   Social Connections:   . Frequency of Communication with Friends and Family:   . Frequency of Social Gatherings with Friends and Family:   . Attends Religious Services:   . Active Member of Clubs or Organizations:   . Attends Archivist Meetings:   Marland Kitchen Marital Status:   Intimate Partner Violence:   . Fear of Current or Ex-Partner:   . Emotionally Abused:   Marland Kitchen Physically Abused:   . Sexually Abused:     Family History  Problem Relation Age of Onset  . Coronary artery disease Neg Hx   . Heart attack Neg Hx   . Breast cancer Neg Hx     Review of Systems:  As stated in the HPI and otherwise negative.   There were no vitals taken for this visit.  Physical Examination:  General: Well developed, well nourished, NAD  HEENT: OP clear, mucus membranes moist  SKIN: warm, dry. No rashes. Neuro: No focal deficits  Musculoskeletal: Muscle strength 5/5 all  ext  Psychiatric: Mood and affect normal  Neck: No JVD, no carotid bruits, no thyromegaly, no lymphadenopathy.  Lungs:Clear bilaterally, no wheezes, rhonci, crackles Cardiovascular: Regular rate and rhythm. No murmurs, gallops or rubs. Abdomen:Soft. Bowel sounds present. Non-tender.  Extremities: No lower extremity edema. Pulses are 2 + in the bilateral DP/PT.  EKG:  EKG is *** ordered today. The ekg ordered today demonstrates   Recent Labs: 01/08/2019: ALT 9; BUN 13; Creatinine, Ser 0.85; Hemoglobin 13.7; Platelets 235; Potassium 3.8; Sodium 138   Lipid Panel    Component Value Date/Time  CHOL 182 01/08/2019 1049   TRIG 65 01/08/2019 1049   HDL 62 01/08/2019 1049   CHOLHDL 2.9 01/08/2019 1049   CHOLHDL 3.0 03/03/2013 1025   VLDL 15 03/03/2013 1025   LDLCALC 108 (H) 01/08/2019 1049     Wt Readings from Last 3 Encounters:  04/12/19 150 lb (68 kg)  03/22/19 151 lb 6.4 oz (68.7 kg)  02/08/19 147 lb (66.7 kg)     Other studies Reviewed: Additional studies/ records that were reviewed today include: . Review of the above records demonstrates:    Assessment and Plan:   1. PVCs/SVT: No palpitations. Continue beta blocker.   2. HTN: BP is well controlled. Continue current therapy.   Current medicines are reviewed at length with the patient today.  The patient does not have concerns regarding medicines.  The following changes have been made:  no change  Labs/ tests ordered today include:  No orders of the defined types were placed in this encounter.   Disposition:   F/U with me in 12  months  Signed, Lauree Chandler, MD 06/02/2019 4:19 PM    [facility] [place], [place], [place]  [address] Phone: [phone]; Fax: [phone]

## 2019-06-03 ENCOUNTER — Ambulatory Visit: Payer: Medicare Other | Admitting: Cardiovascular Disease

## 2019-06-10 ENCOUNTER — Other Ambulatory Visit: Payer: Self-pay | Admitting: Internal Medicine

## 2019-06-10 DIAGNOSIS — Z1231 Encounter for screening mammogram for malignant neoplasm of breast: Secondary | ICD-10-CM

## 2019-06-22 ENCOUNTER — Other Ambulatory Visit: Payer: Self-pay | Admitting: Registered Nurse

## 2019-06-22 DIAGNOSIS — S46811A Strain of other muscles, fascia and tendons at shoulder and upper arm level, right arm, initial encounter: Secondary | ICD-10-CM

## 2019-07-04 ENCOUNTER — Other Ambulatory Visit: Payer: Self-pay | Admitting: Registered Nurse

## 2019-07-04 DIAGNOSIS — K30 Functional dyspepsia: Secondary | ICD-10-CM

## 2019-07-09 ENCOUNTER — Ambulatory Visit (INDEPENDENT_AMBULATORY_CARE_PROVIDER_SITE_OTHER): Payer: Medicare Other | Admitting: Registered Nurse

## 2019-07-09 ENCOUNTER — Encounter: Payer: Self-pay | Admitting: Registered Nurse

## 2019-07-09 ENCOUNTER — Other Ambulatory Visit: Payer: Self-pay

## 2019-07-09 VITALS — BP 144/79 | HR 70 | Temp 98.0°F | Resp 17 | Ht 63.0 in | Wt 155.6 lb

## 2019-07-09 DIAGNOSIS — E785 Hyperlipidemia, unspecified: Secondary | ICD-10-CM

## 2019-07-09 DIAGNOSIS — I10 Essential (primary) hypertension: Secondary | ICD-10-CM

## 2019-07-09 DIAGNOSIS — R7303 Prediabetes: Secondary | ICD-10-CM | POA: Diagnosis not present

## 2019-07-09 DIAGNOSIS — Z1329 Encounter for screening for other suspected endocrine disorder: Secondary | ICD-10-CM | POA: Diagnosis not present

## 2019-07-09 DIAGNOSIS — Z0001 Encounter for general adult medical examination with abnormal findings: Secondary | ICD-10-CM

## 2019-07-09 DIAGNOSIS — Z1159 Encounter for screening for other viral diseases: Secondary | ICD-10-CM

## 2019-07-09 DIAGNOSIS — Z130 Encounter for screening for diseases of the blood and blood-forming organs and certain disorders involving the immune mechanism: Secondary | ICD-10-CM | POA: Diagnosis not present

## 2019-07-09 DIAGNOSIS — Z13228 Encounter for screening for other metabolic disorders: Secondary | ICD-10-CM

## 2019-07-09 DIAGNOSIS — Z0000 Encounter for general adult medical examination without abnormal findings: Secondary | ICD-10-CM

## 2019-07-09 MED ORDER — METOPROLOL SUCCINATE ER 25 MG PO TB24: ORAL_TABLET | ORAL | 1 refills | Status: DC

## 2019-07-09 MED ORDER — PRAVASTATIN SODIUM 40 MG PO TABS: 40.0000 mg | ORAL_TABLET | Freq: Every day | ORAL | 3 refills | Status: DC

## 2019-07-09 NOTE — Patient Instructions (Signed)
° ° ° °  If you have lab work done today you will be contacted with your lab results within the next 2 weeks.  If you have not heard from us then please contact us. The fastest way to get your results is to register for My Chart. ° ° °IF you received an x-ray today, you will receive an invoice from [facility]. Please contact [facility] at [phone] with questions or concerns regarding your invoice.  ° °IF you received labwork today, you will receive an invoice from [facility]. Please contact [facility] at [phone] with questions or concerns regarding your invoice.  ° °Our billing staff will not be able to assist you with questions regarding bills from these companies. ° °You will be contacted with the lab results as soon as they are available. The fastest way to get your results is to activate your My Chart account. Instructions are located on the last page of this paperwork. If you have not heard from us regarding the results in 2 weeks, please contact this office. °  ° ° ° °

## 2019-07-09 NOTE — Progress Notes (Signed)
Ariel Harris is a pleasant 74 y.o. female presenting today for Medicare Annual Wellness Visit. Pt is extremely pleasant and has been seen by myself a number of times in the past year.   Date of last exam: pt unsure - cannot locate in chart, states that she has had AWV previously.  Interpreter used for this visit? Yes  Patient Care Team: Maximiano Coss, NP as PCP - General (Adult Health Nurse Practitioner) Burnell Blanks, MD as PCP - Cardiology (Cardiology)   Other items to address today: medication refills: pravastatin and metoprolol. Taking both with good effect. No complaints.   Other Screening: Last screening for diabetes:  Lab Results  Component Value Date   HGBA1C (H) 11/23/2009    6.0 (NOTE)                                                                       According to the ADA Clinical Practice Recommendations for 2011, when HbA1c is used as a screening test:   >=6.5%   Diagnostic of Diabetes Mellitus           (if abnormal result  is confirmed)  5.7-6.4%   Increased risk of developing Diabetes Mellitus  References:Diagnosis and Classification of Diabetes Mellitus,Diabetes YWVP,7106,26(RSWNI 1):S62-S69 and Standards of Medical Care in         Diabetes - 2011,Diabetes Care,2011,34  (Suppl 1):S11-S61.    Last lipid screening: Lab Results  Component Value Date   CHOL 182 01/08/2019   HDL 62 01/08/2019   LDLCALC 108 (H) 01/08/2019   TRIG 65 01/08/2019   CHOLHDL 2.9 01/08/2019     ADVANCE DIRECTIVES: Discussed: yes On File: no Materials Provided: pt declined  Immunization status:  Immunization History  Administered Date(s) Administered  . Influenza, High Dose Seasonal PF 10/20/2016, 09/10/2018  . Influenza-Unspecified 10/25/2016, 10/27/2017  . PFIZER SARS-COV-2 Vaccination 03/05/2019, 03/30/2019  . Pneumococcal Conjugate-13 12/22/2016     Health Maintenance Due  Topic Date Due  . Hepatitis C Screening  Never done     Functional Status  Survey: Is the patient deaf or have difficulty hearing?: No Does the patient have difficulty seeing, even when wearing glasses/contacts?: No Does the patient have difficulty concentrating, remembering, or making decisions?: No Does the patient have difficulty walking or climbing stairs?: No Does the patient have difficulty dressing or bathing?: No Does the patient have difficulty doing errands alone such as visiting a doctor's office or shopping?: No   6CIT Screen 07/09/2019  What Year? 0 points  What month? 0 points  What time? 0 points  Count back from 20 0 points  Months in reverse 0 points  Repeat phrase 0 points  Total Score 0        Clinical Support from 07/09/2019 in [place] at [facility]  AUDIT-C Score 0       Home Environment: safe. Independent ADLs. No fall risks, no trip hazards. No falls within past year.    Patient Active Problem List   Diagnosis Date Noted  . Itching 03/22/2019  . Joint pain in both hands 03/22/2019  . Paresthesia 03/22/2019  . 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 SVT (supraventricular tachycardia) ([place]) 07/09/2018  . Osteoporosis 05/25/2017  .  Status post total right knee replacement 05/16/2017  . Dyslipidemia 01/12/2010  . Hypertension 01/12/2010     Past Medical History:  Diagnosis Date  . Arthritis    "knees" (05/16/2017)  . Chest pain   . Hyperlipidemia   . Hypertension   . PAC (premature atrial contraction)   . SVT (supraventricular tachycardia) (HCC)      Past Surgical History:  Procedure Laterality Date  . JOINT REPLACEMENT    . KNEE ARTHROSCOPY Left 1996  . KNEE SURGERY Right [phone]   "open surgery; because of the pain"  . 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 TOTAL KNEE ARTHROPLASTY Right 05/16/2017   Procedure: RIGHT TOTAL KNEE ARTHROPLASTY;  Surgeon: Leandrew Koyanagi, MD;  Location: [place];  Service: Orthopedics;  Laterality: Right;  . TOTAL KNEE ARTHROPLASTY Left 11/16/2018   Procedure:  LEFT TOTAL KNEE ARTHROPLASTY;  Surgeon: Leandrew Koyanagi, MD;  Location: [place];  Service: Orthopedics;  Laterality: Left;     Family History  Problem Relation Age of Onset  . Coronary artery disease Neg Hx   . Heart attack Neg Hx   . Breast cancer Neg Hx      Social History   Socioeconomic History  . Marital status: Divorced    Spouse name: Not on file  . Number of children: 2  . Years of education: Not on file  . Highest education level: Not on file  Occupational History  . Not on file  Tobacco Use  . Smoking status: Never Smoker  . Smokeless tobacco: Never Used  Vaping Use  . Vaping Use: Never used  Substance and Sexual Activity  . Alcohol use: No  . Drug use: No  . Sexual activity: Not Currently  Other Topics Concern  . Not on file  Social History Narrative   Negative family `history--Specifically artery disease or MI. The patient has two siblings died at a young age from fevers.   The patient is separated and single. 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 She is independent of daily living. She has never drink or smoke or done drugs   Social Determinants of Health   Financial Resource Strain:   . Difficulty of Paying Living Expenses:   Food Insecurity:   . Worried About Charity fundraiser in the Last Year:   . Arboriculturist in the Last Year:   Transportation Needs:   . Film/video editor (Medical):   Marland Kitchen Lack of Transportation (Non-Medical):   Physical Activity:   . Days of Exercise per Week:   . Minutes of Exercise per Session:   Stress:   . Feeling of Stress :   Social Connections:   . Frequency of Communication with Friends and Family:   . Frequency of Social Gatherings with Friends and Family:   . Attends Religious Services:   . Active Member of Clubs or Organizations:   . Attends Archivist Meetings:   Marland Kitchen Marital Status:   Intimate Partner Violence:   . Fear of Current or Ex-Partner:   . Emotionally Abused:   Marland Kitchen  Physically Abused:   . Sexually Abused:      Allergies  Allergen Reactions  . Indomethacin Swelling  . Tramadol     Sweaty, made feel bad     Prior to Admission medications   Medication Sig Start Date End Date Taking? Authorizing Provider  metoprolol succinate (TOPROL XL) 25 MG 24 hr tablet Take 1 tablet  by mouth daily, may take 1 extra tablet by mouth daily only as needed for palpitations 03/22/19  Yes Maximiano Coss, NP  pravastatin (PRAVACHOL) 40 MG tablet Take 1 tablet (40 mg total) by mouth daily. 03/22/19  Yes Maximiano Coss, NP  aspirin 81 MG EC tablet TAKE 1 TABLET (81 MG TOTAL) BY MOUTH 2 (TWO) TIMES DAILY. Patient not taking: Reported on 04/12/2019 01/18/19   Aundra Dubin, PA-C  betamethasone dipropionate 0.05 % cream Apply topically 2 (two) times daily. Patient not taking: Reported on 07/09/2019 03/22/19   Maximiano Coss, NP  calcium-vitamin D (OSCAL WITH D) 500-200 MG-UNIT tablet Take 1 tablet by mouth daily with breakfast. Patient not taking: Reported on 07/09/2019    [provider]  escitalopram (LEXAPRO) 10 MG tablet TAKE 1 TABLET BY MOUTH EVERY DAY Patient not taking: Reported on 07/09/2019 05/03/19   Maximiano Coss, NP  hydrOXYzine (ATARAX/VISTARIL) 10 MG tablet Take 1 tablet (10 mg total) by mouth every evening. May take 1 additional tablet if needed to reach desired effect. Patient not taking: Reported on 04/12/2019 02/08/19   Maximiano Coss, NP  methocarbamol (ROBAXIN) 500 MG tablet TAKE 1 TABLET (500 MG TOTAL) BY MOUTH 2 (TWO) TIMES DAILY AS NEEDED FOR MUSCLE SPASMS. Patient not taking: Reported on 07/09/2019 06/23/19   Maximiano Coss, NP  omeprazole (PRILOSEC) 20 MG capsule TAKE 1 CAPSULE BY MOUTH EVERY DAY Patient not taking: Reported on 07/09/2019 07/05/19   Maximiano Coss, NP  ondansetron (ZOFRAN) 4 MG tablet Take 1-2 tablets (4-8 mg total) by mouth every 8 (eight) hours as needed for nausea or vomiting. Patient not taking: Reported on 01/08/2019 11/16/18    Leandrew Koyanagi, MD  Psyllium Husk 100 % POWD Take 1 Scoop by mouth daily with breakfast. Patient not taking: Reported on 07/09/2019 04/12/19   Maximiano Coss, NP  sulfamethoxazole-trimethoprim (BACTRIM DS) 800-160 MG tablet Take 1 tablet by mouth 2 (two) times daily. Patient not taking: Reported on 07/09/2019 04/12/19   Maximiano Coss, NP  Vitamin D, Ergocalciferol, (DRISDOL) 1.25 MG (50000 UNIT) CAPS capsule Take 1 capsule (50,000 Units total) by mouth every 7 (seven) days. Patient not taking: Reported on 07/09/2019 03/23/19   Maximiano Coss, NP     Depression screen [facility] 2/9 07/09/2019 04/12/2019 03/22/2019 02/08/2019 01/08/2019  Decreased Interest 0 0 0 0 0  Down, Depressed, Hopeless 0 0 0 3 0  PHQ - 2 Score 0 0 0 3 0  Altered sleeping - - - 3 -  Tired, decreased energy - - - 3 -  Change in appetite - - - 2 -  Feeling bad or failure about yourself  - - - 0 -  Trouble concentrating - - - 2 -  Moving slowly or fidgety/restless - - - 0 -  Suicidal thoughts - - - 0 -  PHQ-9 Score - - - 13 -  Difficult doing work/chores - - - Somewhat difficult -     Fall Risk  07/09/2019 04/12/2019 03/22/2019 02/08/2019 01/08/2019  Falls in the past year? 0 0 0 0 0  Number falls in past yr: 0 0 0 0 0  Injury with Fall? 0 0 0 0 0  Follow up Falls evaluation completed Falls evaluation completed Falls evaluation completed Falls evaluation completed -      PHYSICAL EXAM: BP (!) 144/79   Pulse 70   Temp 98 F (36.7 C) (Temporal)   Resp 17   Ht 5\' 3"  (1.6 m)   Wt 155 lb 9.6 oz (  70.6 kg)   SpO2 96%   BMI 27.56 kg/m    Wt Readings from Last 3 Encounters:  07/09/19 155 lb 9.6 oz (70.6 kg)  04/12/19 150 lb (68 kg)  03/22/19 151 lb 6.4 oz (68.7 kg)      Hearing Screening   125Hz  250Hz  500Hz  1000Hz  2000Hz  3000Hz  4000Hz  6000Hz  8000Hz   Right ear:           Left ear:             Visual Acuity Screening   Right eye Left eye Both eyes  Without correction: 20/50 20/50 20/50   With correction:          Physical Exam   Education/Counseling provided regarding diet and exercise, prevention of chronic diseases, smoking/tobacco cessation, if applicable, and reviewed "Covered Medicare Preventive Services."   ASSESSMENT/PLAN: Orders Placed This Encounter  Procedures  . Hepatitis C antibody  . TSH  . Lipid panel  . Comprehensive metabolic panel  . CBC with Differential  . Hemoglobin A1c   No concerns on visit today. Continue with annual screening and 6 mo check on bp, lipids. Return sooner with any complaints.  Thank you for your visit with Primary Care at [facility] today.  Maximiano Coss, NP [facility] at [address], [place] [address] [phone] - [phone]

## 2019-07-10 LAB — COMPREHENSIVE METABOLIC PANEL
ALT: 12 IU/L (ref 0–32)
AST: 21 IU/L (ref 0–40)
Albumin/Globulin Ratio: 1.4 (ref 1.2–2.2)
Albumin: 4.1 g/dL (ref 3.7–4.7)
Alkaline Phosphatase: 40 IU/L — ABNORMAL LOW (ref 48–121)
BUN/Creatinine Ratio: 18 (ref 12–28)
BUN: 14 mg/dL (ref 8–27)
Bilirubin Total: 0.9 mg/dL (ref 0.0–1.2)
CO2: 21 mmol/L (ref 20–29)
Calcium: 9.2 mg/dL (ref 8.7–10.3)
Chloride: 106 mmol/L (ref 96–106)
Creatinine, Ser: 0.76 mg/dL (ref 0.57–1.00)
GFR calc Af Amer: 91 mL/min/{1.73_m2} (ref >59)
GFR calc non Af Amer: 79 mL/min/{1.73_m2} (ref >59)
Globulin, Total: 3 g/dL (ref 1.5–4.5)
Glucose: 87 mg/dL (ref 65–99)
Potassium: 3.9 mmol/L (ref 3.5–5.2)
Sodium: 142 mmol/L (ref 134–144)
Total Protein: 7.1 g/dL (ref 6.0–8.5)

## 2019-07-10 LAB — LIPID PANEL
Chol/HDL Ratio: 2.7 ratio (ref 0.0–4.4)
Cholesterol, Total: 187 mg/dL (ref 100–199)
HDL: 69 mg/dL (ref >39)
LDL Chol Calc (NIH): 106 mg/dL — ABNORMAL HIGH (ref 0–99)
Triglycerides: 64 mg/dL (ref 0–149)
VLDL Cholesterol Cal: 12 mg/dL (ref 5–40)

## 2019-07-10 LAB — CBC WITH DIFFERENTIAL/PLATELET
Basophils Absolute: 0 10*3/uL (ref 0.0–0.2)
Basos: 1 %
EOS (ABSOLUTE): 0.2 10*3/uL (ref 0.0–0.4)
Eos: 6 %
Hematocrit: 43.6 % (ref 34.0–46.6)
Hemoglobin: 14.4 g/dL (ref 11.1–15.9)
Immature Grans (Abs): 0 10*3/uL (ref 0.0–0.1)
Immature Granulocytes: 0 %
Lymphocytes Absolute: 1.1 10*3/uL (ref 0.7–3.1)
Lymphs: 33 %
MCH: 28.8 pg (ref 26.6–33.0)
MCHC: 33 g/dL (ref 31.5–35.7)
MCV: 87 fL (ref 79–97)
Monocytes Absolute: 0.4 10*3/uL (ref 0.1–0.9)
Monocytes: 12 %
Neutrophils Absolute: 1.6 10*3/uL (ref 1.4–7.0)
Neutrophils: 48 %
Platelets: 176 10*3/uL (ref 150–450)
RBC: 5 x10E6/uL (ref 3.77–5.28)
RDW: 13.1 % (ref 11.7–15.4)
WBC: 3.4 10*3/uL (ref 3.4–10.8)

## 2019-07-10 LAB — HEMOGLOBIN A1C
Est. average glucose Bld gHb Est-mCnc: 120 mg/dL
Hgb A1c MFr Bld: 5.8 % — ABNORMAL HIGH (ref 4.8–5.6)

## 2019-07-10 LAB — TSH: TSH: 1.04 u[IU]/mL (ref 0.450–4.500)

## 2019-07-10 LAB — HEPATITIS C ANTIBODY: Hep C Virus Ab: <0.1 s/co ratio (ref 0.0–0.9)

## 2019-07-28 ENCOUNTER — Other Ambulatory Visit: Payer: Self-pay | Admitting: Registered Nurse

## 2019-07-28 DIAGNOSIS — F419 Anxiety disorder, unspecified: Secondary | ICD-10-CM

## 2019-09-13 DIAGNOSIS — B079 Viral wart, unspecified: Secondary | ICD-10-CM | POA: Diagnosis not present

## 2019-09-24 DIAGNOSIS — B079 Viral wart, unspecified: Secondary | ICD-10-CM | POA: Diagnosis not present

## 2019-10-08 DIAGNOSIS — B079 Viral wart, unspecified: Secondary | ICD-10-CM | POA: Diagnosis not present

## 2019-10-11 ENCOUNTER — Inpatient Hospital Stay: Admission: RE | Admit: 2019-10-11 | Payer: Medicare Other | Source: Ambulatory Visit

## 2019-10-19 ENCOUNTER — Other Ambulatory Visit: Payer: Self-pay | Admitting: Registered Nurse

## 2019-10-19 DIAGNOSIS — F32A Depression, unspecified: Secondary | ICD-10-CM

## 2019-10-19 NOTE — Telephone Encounter (Signed)
Patient is requesting a refill of the following medications: Requested Prescriptions   Pending Prescriptions Disp Refills   escitalopram (LEXAPRO) 10 MG tablet [Pharmacy Med Name: ESCITALOPRAM 10 MG TABLET] 90 tablet 0    Sig: TAKE 1 TABLET BY MOUTH EVERY DAY    Date of patient request: 09/18/19 Last office visit: 04/12/19 Date of last refill: 07/28/19 Last refill amount: 90 + 0 Follow up time period per chart: NO Scheduled yet

## 2019-10-28 DIAGNOSIS — Z23 Encounter for immunization: Secondary | ICD-10-CM | POA: Diagnosis not present

## 2019-11-09 ENCOUNTER — Ambulatory Visit: Payer: Self-pay

## 2019-11-09 ENCOUNTER — Telehealth: Payer: Self-pay | Admitting: *Deleted

## 2019-11-09 ENCOUNTER — Encounter: Payer: Self-pay | Admitting: Orthopaedic Surgery

## 2019-11-09 ENCOUNTER — Ambulatory Visit (INDEPENDENT_AMBULATORY_CARE_PROVIDER_SITE_OTHER): Payer: Medicare Other | Admitting: Orthopaedic Surgery

## 2019-11-09 VITALS — Ht 63.0 in | Wt 155.0 lb

## 2019-11-09 DIAGNOSIS — Z96651 Presence of right artificial knee joint: Secondary | ICD-10-CM

## 2019-11-09 DIAGNOSIS — Z96652 Presence of left artificial knee joint: Secondary | ICD-10-CM | POA: Diagnosis not present

## 2019-11-09 NOTE — Telephone Encounter (Signed)
Ortho bundle 1 year call/in office visit completed.

## 2019-11-09 NOTE — Progress Notes (Signed)
Office Visit Note   Patient: Ariel Harris           Date of Birth: [birth year]           MRN: [id number] Visit Date: 11/09/2019              Requested by: Antonietta Jewel, MD [address] [place],  [place] [address] PCP: Maximiano Coss, NP   Assessment & Plan: Visit Diagnoses:  1. S/P TKR (total knee replacement), left   2. Status post total right knee replacement     Plan: Patient is doing well 1 year status post left total knee replacement and 2 and half years status post right total knee replacement.  Dental prophylaxis reinforced.  Questions encouraged and answered.  Follow-up in another year with two-view x-rays of bilateral knees.  Follow-Up Instructions: Return in about 1 year (around 11/08/2020).   Orders:  Orders Placed This Encounter  Procedures   XR Knee 1-2 Views Right   XR Knee 1-2 Views Left   No orders of the defined types were placed in this encounter.     Procedures: No procedures performed   Clinical Data: No additional findings.   Subjective: Chief Complaint  Patient presents with   Left Knee - Follow-up    Left total knee arthroplasty 11/16/2018   Right Knee - Follow-up    Right total knee arthroplasty 05/16/2017    Patient is 1 year status post left total knee replacement and 2 and half years status post right total knee replacement.  She is overall doing well.  She has no real complaints other than some occasional discomfort in the right knee with increased activity and exercising.  No complaints or medical changes.   Review of Systems   Objective: Vital Signs: Ht 5\' 3"  (1.6 m)    Wt 155 lb (70.3 kg)    BMI 27.46 kg/m   Physical Exam  Ortho Exam Bilateral knees show fully healed surgical scars.  Good range of motion.  No pain or swelling. Specialty Comments:  No specialty comments available.  Imaging: XR Knee 1-2 Views Left  Result Date: 11/09/2019 Stable total knee replacement in good alignment.   XR Knee 1-2  Views Right  Result Date: 11/09/2019 Stable total knee replacement in good alignment     PMFS History: Patient Active Problem List   Diagnosis Date Noted   S/P TKR (total knee replacement), left 11/09/2019   Itching 03/22/2019   Joint pain in both hands 03/22/2019   Paresthesia 03/22/2019   Status post total left knee replacement 11/16/2018   SVT (supraventricular tachycardia) ([facility]) 07/09/2018   Osteoporosis 05/25/2017   Status post total right knee replacement 05/16/2017   Dyslipidemia 01/12/2010   Hypertension 01/12/2010   Past Medical History:  Diagnosis Date   Arthritis    "knees" (05/16/2017)   Chest pain    Hyperlipidemia    Hypertension    PAC (premature atrial contraction)    SVT (supraventricular tachycardia) ([facility])     Family History  Problem Relation Age of Onset   Coronary artery disease Neg Hx    Heart attack Neg Hx    Breast cancer Neg Hx     Past Surgical History:  Procedure Laterality Date   JOINT REPLACEMENT     KNEE ARTHROSCOPY Left 1996   KNEE SURGERY Right [phone]   "open surgery; because of the pain"   TOTAL KNEE ARTHROPLASTY Right 05/16/2017   TOTAL KNEE ARTHROPLASTY Right 05/16/2017  Procedure: RIGHT TOTAL KNEE ARTHROPLASTY;  Surgeon: Leandrew Koyanagi, MD;  Location: [facility];  Service: Orthopedics;  Laterality: Right;   TOTAL KNEE ARTHROPLASTY Left 11/16/2018   Procedure: LEFT TOTAL KNEE ARTHROPLASTY;  Surgeon: Leandrew Koyanagi, MD;  Location: [facility];  Service: Orthopedics;  Laterality: Left;   Social History   Occupational History   Not on file  Tobacco Use   Smoking status: Never Smoker   Smokeless tobacco: Never Used  Vaping Use   Vaping Use: Never used  Substance and Sexual Activity   Alcohol use: No   Drug use: No   Sexual activity: Not Currently

## 2019-12-10 DIAGNOSIS — B079 Viral wart, unspecified: Secondary | ICD-10-CM | POA: Diagnosis not present

## 2019-12-21 ENCOUNTER — Other Ambulatory Visit: Payer: Self-pay

## 2019-12-21 ENCOUNTER — Ambulatory Visit
Admission: RE | Admit: 2019-12-21 | Discharge: 2019-12-21 | Disposition: A | Discharge status: Home / Self Care | Payer: Medicare Other | Source: Ambulatory Visit | Attending: Internal Medicine | Admitting: Internal Medicine

## 2019-12-21 DIAGNOSIS — Z1231 Encounter for screening mammogram for malignant neoplasm of breast: Secondary | ICD-10-CM

## 2019-12-30 DIAGNOSIS — Z20822 Contact with and (suspected) exposure to covid-19: Secondary | ICD-10-CM | POA: Diagnosis not present

## 2019-12-30 DIAGNOSIS — U071 COVID-19: Secondary | ICD-10-CM | POA: Diagnosis not present

## 2020-01-04 ENCOUNTER — Other Ambulatory Visit: Payer: Self-pay | Admitting: Registered Nurse

## 2020-01-04 DIAGNOSIS — K30 Functional dyspepsia: Secondary | ICD-10-CM

## 2020-01-27 ENCOUNTER — Other Ambulatory Visit: Payer: Self-pay

## 2020-01-27 ENCOUNTER — Encounter: Payer: Self-pay | Admitting: Family Medicine

## 2020-01-27 ENCOUNTER — Ambulatory Visit (INDEPENDENT_AMBULATORY_CARE_PROVIDER_SITE_OTHER): Payer: Medicare Other | Admitting: Family Medicine

## 2020-01-27 VITALS — BP 136/75 | HR 65 | Temp 97.8°F | Ht 63.0 in | Wt 160.0 lb

## 2020-01-27 DIAGNOSIS — M5432 Sciatica, left side: Secondary | ICD-10-CM | POA: Diagnosis not present

## 2020-01-27 DIAGNOSIS — E2839 Other primary ovarian failure: Secondary | ICD-10-CM | POA: Diagnosis not present

## 2020-01-27 MED ORDER — ACETAMINOPHEN 500 MG PO TABS: 500.0000 mg | ORAL_TABLET | Freq: Four times a day (QID) | ORAL | 0 refills | Status: DC | PRN

## 2020-01-27 MED ORDER — DICLOFENAC SODIUM 1 % EX GEL: 2.0000 g | Freq: Four times a day (QID) | CUTANEOUS | 3 refills | Status: DC

## 2020-01-27 NOTE — Progress Notes (Signed)
12/30/202112:15 PM  Ariel Harris 10/22/1945, 74 y.o., female [id number]  Chief Complaint  Patient presents with  . Back Pain    With pain in her buttocks , mainly L side  . Knee Pain    Bilateral - hx of knee replacement both knees- requests alendronate 70 mg    HPI:   Patient is a 74 y.o. female with past medical history significant for HTN, Osteoporosis, HLD who presents today for back and knee pain.  Last DEXA: 07/25/09 Denies urinary and GI symptoms 2019 and 2020 had bilateral knee replacements Lower back pain radiating to buttocks and occasionally knees Positional pain: worse when lying down or to the side No pain when sitting straight  Started 1 week ago Denies numbness and tingling Constant pain 6-7/10 Worse at night Pain to the left of spine (only left side)  Tylenol for pain Heat for pain and hot oil Takes Calcium for pain  Previously on Alendronate Stopped over a year ago She was confused and thought this medication was for pain Was only ever taking once a week  Metoprolol 25mg  daily BP Readings from Last 3 Encounters:  01/27/20 136/75  07/09/19 (!) 144/79  04/12/19 (!) 157/90    Daily Pravastatin 40 mg daily Lab Results  Component Value Date   CHOL 187 07/09/2019   HDL 69 07/09/2019   LDLCALC 106 (H) 07/09/2019   TRIG 64 07/09/2019   CHOLHDL 2.7 07/09/2019    Depression screen PHQ 2/9 01/27/2020 07/09/2019 04/12/2019  Decreased Interest 0 0 0  Down, Depressed, Hopeless 0 0 0  PHQ - 2 Score 0 0 0  Altered sleeping - - -  Tired, decreased energy - - -  Change in appetite - - -  Feeling bad or failure about yourself  - - -  Trouble concentrating - - -  Moving slowly or fidgety/restless - - -  Suicidal thoughts - - -  PHQ-9 Score - - -  Difficult doing work/chores - - -    Fall Risk  01/27/2020 07/09/2019 04/12/2019 03/22/2019 02/08/2019  Falls in the past year? 0 0 0 0 0  Number falls in past yr: 0 0 0 0 0  Injury with Fall? 0 0 0 0 0   Follow up Falls evaluation completed Falls evaluation completed Falls evaluation completed Falls evaluation completed Falls evaluation completed     Allergies  Allergen Reactions  . Indomethacin Swelling  . Tramadol     Sweaty, made feel bad    Prior to Admission medications   Medication Sig Start Date End Date Taking? Authorizing Provider  aspirin 81 MG EC tablet TAKE 1 TABLET (81 MG TOTAL) BY MOUTH 2 (TWO) TIMES DAILY. 01/18/19   Aundra Dubin, PA-C  betamethasone dipropionate 0.05 % cream Apply topically 2 (two) times daily. 03/22/19   Maximiano Coss, NP  calcium-vitamin D (OSCAL WITH D) 500-200 MG-UNIT tablet Take 1 tablet by mouth daily with breakfast.     [provider]  escitalopram (LEXAPRO) 10 MG tablet TAKE 1 TABLET BY MOUTH EVERY DAY 10/19/19   Maximiano Coss, NP  hydrOXYzine (ATARAX/VISTARIL) 10 MG tablet Take 1 tablet (10 mg total) by mouth every evening. May take 1 additional tablet if needed to reach desired effect. 02/08/19   Maximiano Coss, NP  methocarbamol (ROBAXIN) 500 MG tablet TAKE 1 TABLET (500 MG TOTAL) BY MOUTH 2 (TWO) TIMES DAILY AS NEEDED FOR MUSCLE SPASMS. 06/23/19   Maximiano Coss, NP  metoprolol succinate (TOPROL XL) 25  MG 24 hr tablet Take 1 tablet by mouth daily, may take 1 extra tablet by mouth daily only as needed for palpitations 07/09/19   Janeece Agee, NP  omeprazole (PRILOSEC) 20 MG capsule TAKE 1 CAPSULE BY MOUTH EVERY DAY 01/04/20   Janeece Agee, NP  ondansetron (ZOFRAN) 4 MG tablet Take 1-2 tablets (4-8 mg total) by mouth every 8 (eight) hours as needed for nausea or vomiting. 11/16/18   Tarry Kos, MD  pravastatin (PRAVACHOL) 40 MG tablet Take 1 tablet (40 mg total) by mouth daily. 07/09/19   Janeece Agee, NP  Psyllium Husk 100 % POWD Take 1 Scoop by mouth daily with breakfast. 04/12/19   Janeece Agee, NP  sulfamethoxazole-trimethoprim (BACTRIM DS) 800-160 MG tablet Take 1 tablet by mouth 2 (two) times daily. 04/12/19   Janeece Agee, NP  Vitamin D, Ergocalciferol, (DRISDOL) 1.25 MG (50000 UNIT) CAPS capsule Take 1 capsule (50,000 Units total) by mouth every 7 (seven) days. 03/23/19   Janeece Agee, NP    Past Medical History:  Diagnosis Date  . Arthritis    "knees" (05/16/2017)  . Chest pain   . Hyperlipidemia   . Hypertension   . PAC (premature atrial contraction)   . SVT (supraventricular tachycardia) ([facility])     Past Surgical History:  Procedure Laterality Date  . JOINT REPLACEMENT    . KNEE ARTHROSCOPY Left 1996  . KNEE SURGERY Right [phone]   "open surgery; because of the pain"  . 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 TOTAL KNEE ARTHROPLASTY Right 05/16/2017   Procedure: RIGHT TOTAL KNEE ARTHROPLASTY;  Surgeon: Tarry Kos, MD;  Location: [facility];  Service: Orthopedics;  Laterality: Right;  . TOTAL KNEE ARTHROPLASTY Left 11/16/2018   Procedure: LEFT TOTAL KNEE ARTHROPLASTY;  Surgeon: Tarry Kos, MD;  Location: [facility];  Service: Orthopedics;  Laterality: Left;    Social History   Tobacco Use  . Smoking status: Never Smoker  . Smokeless tobacco: Never Used  Substance Use Topics  . Alcohol use: No    Family History  Problem Relation Age of Onset  . Coronary artery disease Neg Hx   . Heart attack Neg Hx   . Breast cancer Neg Hx     Review of Systems  Constitutional: Negative for chills, fever and malaise/fatigue.  Eyes: Negative for blurred vision and double vision.  Respiratory: Negative for cough, shortness of breath and wheezing.   Cardiovascular: Negative for chest pain, palpitations and leg swelling.  Gastrointestinal: Negative for abdominal pain, blood in stool, constipation, diarrhea, heartburn, nausea and vomiting.  Genitourinary: Negative for dysuria, frequency and hematuria.  Musculoskeletal: Positive for back pain. Negative for falls, joint pain, myalgias and neck pain.  Skin: Negative for rash.  Neurological: Negative for dizziness, weakness and headaches.      OBJECTIVE:  Today's Vitals   01/27/20 1111  BP: 136/75  Pulse: 65  Temp: 97.8 F (36.6 C)  SpO2: 97%  Weight: 160 lb (72.6 kg)  Height: 5\' 3"  (1.6 m)   Body mass index is 28.34 kg/m.   Physical Exam Constitutional:      General: She is not in acute distress.    Appearance: Normal appearance. She is not ill-appearing.  HENT:     Head: Normocephalic.  Cardiovascular:     Rate and Rhythm: Normal rate and regular rhythm.     Pulses: Normal pulses.     Heart sounds: Normal heart sounds. No murmur heard. No friction rub. No gallop.  Pulmonary:     Effort: Pulmonary effort is normal. No respiratory distress.     Breath sounds: Normal breath sounds. No stridor. No wheezing, rhonchi or rales.  Abdominal:     General: Bowel sounds are normal.     Palpations: Abdomen is soft.     Tenderness: There is no abdominal tenderness.  Musculoskeletal:     Thoracic back: Normal.     Lumbar back: Normal.     Right knee: Normal.     Left knee: Normal.     Right lower leg: Normal. No edema.     Left lower leg: Normal. No edema.  Skin:    General: Skin is warm and dry.  Neurological:     Mental Status: She is alert and oriented to person, place, and time.  Psychiatric:        Mood and Affect: Mood normal.        Behavior: Behavior normal.     No results found for this or any previous visit (from the past 24 hour(s)).  No results found.   ASSESSMENT and PLAN  Problem List Items Addressed This Visit   None   Visit Diagnoses    Estrogen deficiency    -  Primary   Relevant Orders   DG Bone Density  Had a long discussion about alendronate and calcium supplementation We will follow up with this post DEXA scan    Sciatica of left side       Relevant Medications   diclofenac Sodium (VOLTAREN) 1 % GEL   acetaminophen (TYLENOL) 500 MG tablet  Due to allergies is unable to use NSAIDs Encourage to use ice/heat as needed RTC precautions provided R/se/b of medications  discussed   Other Relevant Orders   Ambulatory referral to Physical Therapy        Return in about 3 months (around 04/26/2020) for Discuss DEXA and Alendronate.    Huston Foley Chlora Mcbain, FNP-BC [facility] [place], [place] [address] Ph.  [phone] Fax [phone]

## 2020-01-27 NOTE — Patient Instructions (Addendum)
Sciatica Rehab Ask your health care provider which exercises are safe for you. Do exercises exactly as told by your health care provider and adjust them as directed. It is normal to feel mild stretching, pulling, tightness, or discomfort as you do these exercises. Stop right away if you feel sudden pain or your pain gets worse. Do not begin these exercises until told by your health care provider. Stretching and range-of-motion exercises These exercises warm up your muscles and joints and improve the movement and flexibility of your hips and back. These exercises also help to relieve pain, numbness, and tingling. Sciatic nerve glide 1. Sit in a chair with your head facing down toward your chest. Place your hands behind your back. Let your shoulders slump forward. 2. Slowly straighten one of your legs while you tilt your head back as if you are looking toward the ceiling. Only straighten your leg as far as you can without making your symptoms worse. 3. Hold this position for __________ seconds. 4. Slowly return to the starting position. 5. Repeat with your other leg. Repeat __________ times. Complete this exercise __________ times a day. Knee to chest with hip adduction and internal rotation  1. Lie on your back on a firm surface with both legs straight. 2. Bend one of your knees and move it up toward your chest until you feel a gentle stretch in your lower back and buttock. Then, move your knee toward the shoulder that is on the opposite side from your leg. This is hip adduction and internal rotation. ? Hold your leg in this position by holding on to the front of your knee. 3. Hold this position for __________ seconds. 4. Slowly return to the starting position. 5. Repeat with your other leg. Repeat __________ times. Complete this exercise __________ times a day. Prone extension on elbows  1. Lie on your abdomen on a firm surface. A bed may be too soft for this exercise. 2. Prop yourself up  on your elbows. 3. Use your arms to help lift your chest up until you feel a gentle stretch in your abdomen and your lower back. ? This will place some of your body weight on your elbows. If this is uncomfortable, try stacking pillows under your chest. ? Your hips should stay down, against the surface that you are lying on. Keep your hip and back muscles relaxed. 4. Hold this position for __________ seconds. 5. Slowly relax your upper body and return to the starting position. Repeat __________ times. Complete this exercise __________ times a day. Strengthening exercises These exercises build strength and endurance in your back. Endurance is the ability to use your muscles for a long time, even after they get tired. Pelvic tilt This exercise strengthens the muscles that lie deep in the abdomen. 1. Lie on your back on a firm surface. Bend your knees and keep your feet flat on the floor. 2. Tense your abdominal muscles. Tip your pelvis up toward the ceiling and flatten your lower back into the floor. ? To help with this exercise, you may place a small towel under your lower back and try to push your back into the towel. 3. Hold this position for __________ seconds. 4. Let your muscles relax completely before you repeat this exercise. Repeat __________ times. Complete this exercise __________ times a day. Alternating arm and leg raises  1. Get on your hands and knees on a firm surface. If you are on a hard floor, you may want to  use padding, such as an exercise mat, to cushion your knees. 2. Line up your arms and legs. Your hands should be directly below your shoulders, and your knees should be directly below your hips. 3. Lift your left leg behind you. At the same time, raise your right arm and straighten it in front of you. ? Do not lift your leg higher than your hip. ? Do not lift your arm higher than your shoulder. ? Keep your abdominal and back muscles tight. ? Keep your hips facing the  ground. ? Do not arch your back. ? Keep your balance carefully, and do not hold your breath. 4. Hold this position for __________ seconds. 5. Slowly return to the starting position. 6. Repeat with your right leg and your left arm. Repeat __________ times. Complete this exercise __________ times a day. Posture and body mechanics Good posture and healthy body mechanics can help to relieve stress in your body's tissues and joints. Body mechanics refers to the movements and positions of your body while you do your daily activities. Posture is part of body mechanics. Good posture means:  Your spine is in its natural S-curve position (neutral).  Your shoulders are pulled back slightly.  Your head is not tipped forward. Follow these guidelines to improve your posture and body mechanics in your everyday activities. Standing   When standing, keep your spine neutral and your feet about hip width apart. Keep a slight bend in your knees. Your ears, shoulders, and hips should line up.  When you do a task in which you stand in one place for a long time, place one foot up on a stable object that is 2-4 inches (5-10 cm) high, such as a footstool. This helps keep your spine neutral. Sitting   When sitting, keep your spine neutral and keep your feet flat on the floor. Use a footrest, if necessary, and keep your thighs parallel to the floor. Avoid rounding your shoulders, and avoid tilting your head forward.  When working at a desk or a computer, keep your desk at a height where your hands are slightly lower than your elbows. Slide your chair under your desk so you are close enough to maintain good posture.  When working at a computer, place your monitor at a height where you are looking straight ahead and you do not have to tilt your head forward or downward to look at the screen. Resting  When lying down and resting, avoid positions that are most painful for you.  If you have pain with activities  such as sitting, bending, stooping, or squatting, lie in a position in which your body does not bend very much. For example, avoid curling up on your side with your arms and knees near your chest (fetal position).  If you have pain with activities such as standing for a long time or reaching with your arms, lie with your spine in a neutral position and bend your knees slightly. Try the following positions: ? Lying on your side with a pillow between your knees. ? Lying on your back with a pillow under your knees. Lifting   When lifting objects, keep your feet at least shoulder width apart and tighten your abdominal muscles.  Bend your knees and hips and keep your spine neutral. It is important to lift using the strength of your legs, not your back. Do not lock your knees straight out.  Always ask for help to lift heavy or awkward objects. This information is  not intended to replace advice given to you by your health care provider. Make sure you discuss any questions you have with your health care provider. Document Revised: 05/08/2018 Document Reviewed: 02/05/2018 Elsevier Patient Education  [employer].    ?au th?n kinh to?a Sciatica  ?au th?n kinh to?a la? ?au, t, y?u Mcdill??c ?au bu?t do?c theo ????ng ?i cu?a dy th?n kinh t?a. Dy th?n kinh t?a b??t ??u ?? th??t l?ng va? cha?y xu?ng m?t sau c?a m?i chn. Dy th?n kinh na?y ki?m soa?t ca?c c? ?? c??ng chn va? ?? m?t sau ??u g?i. Dy th?n kinh ny cu?ng ta?o ra ca?m gia?c (ca?m gia?c) cho m?t sau ?u?i, c??ng chn va? gan ba?n chn. ?au th?n kinh to?a la? m?t tri?u ch??ng cu?a m?t tnh tr?ng b?nh ly? kha?c la?m bo? ch??t [place]??c ti? ?e? ln dy th?n kinh to?a. ?au th?n kinh toa? th??ng chi? ?nh h??ng ??n m?t bn cu?a c? th?. ?au th?n kinh to?a th???ng t?? kho?i Ure??c ???c ?i?u tri? kho?i. Trong m?t s? tr???ng h??p, ?au th?n kinh to?a co? th? quay tr?? la?i (ta?i pha?t). Nguyn nhn g gy ra? Ti?nh tra?ng na?y  xa?y ra khi co? chn p ln dy th?n kinh to?a Fischler??c khi dy th?n kinh to?a bi? bo? ch?t. ?i?u ? co? th? la? k?t qua? cu?a:  M?t ?i?a ??m gi??a ca?c x??ng c?t s?ng l?i ra qua? nhi?u (thoa?t vi? ?i?a ??m).  Thay ??i lin quan ??n tu?i tc trong cc ??a ??m c?t s?ng.  Tnh tr?ng ?au a?nh h???ng ??n m?t c? ?? mng.  X??ng mo?c thm g?n dy th?n kinh to?a.  V?? (ga?y x??ng) vu?ng ch?u.  Mang [place].  Kh?i u. Tr??ng h?p ny hi?m khi x?y ra. ?i?u g lm t?ng nguy c?? Nh?ng y?u t? sau c th? khi?n qu v? d? b? tnh tr?ng ny h?n:  Ch?i ca?c mn th? thao ti? ?e? Delaney??c e?p ln c?t s?ng.  Co? s??c b?n va? ?? linh hoa?t ke?m.  Co? ti?n s?? bi? ph?u thu?t Brisby?c ch?n th??ng ? l?ng.  Ng?i trong th?i [employer].  Th??c hi?n ca?c hoa?t ??ng lin quan ??n g?p ng???i Ditto??c nng v?t n??ng l??p ?i l??p la?i.  Bo ph. C cc d?u hi?u Zacarias?c tri?u ch?ng g? Ca?c tri?u ch??ng co? th? kha?c nhau t?? nhe? cho ??n r?t n??ng va? cc tri?u ch?ng ? co? th? bao g?m:  B?t ky? v?n ?? na?o trong s? cc v?n ?? na?y ?? th??t l?ng, chn, hng Yung??c mng: ? ?au bu?t nhe?, t b Caris??c ?au m ?. ? Ca?m gia?c no?ng ra?t. ? ?au nho?i.  T ?? sau b??p chn Boyan??c gan ba?n chn.  Y?u chn.  ?au l?ng r?t nhi?u khi?n c?? ??ng kho? kh?n. Cc tri?u ch??ng co? th? tr?m tr?ng h?n khi Lopezmartinez, h??t h?i [place]??c c???i, Farrugia??c khi quy? vi? ng?i Schoch??c ???ng trong th??i gian da?i. Ch?n ?on tnh tr?ng ny nh? th? no? Tnh tr?ng ny c th? ???c ch?n ?on d?a vo:  Tri?u ch?ng v b?nh s? c?a qu v?.  Khm th?c th?.  Xt nghi?m mu.  Ca?c ki?m tra hnh ?nh, nh?: ? Ch?p x-quang. ? Ch?p c?ng h??ng t? (MRI). ? Ch?p c?t l?p vi tnh (CT). Tnh tr?ng ny ???c ?i?u tr? nh? th? no? Trong nhi?u tr???ng h??p, tnh tr?ng na?y t?? ??? ma? khng c?n ?i?u tr?[place] nhin, vi?c ?i?u tr? c th? bao g?m:  Gi?m Bonet?c ?i?u ch?nh Beilfuss?t ??ng th? ch?t.  T?p th? d?c v  gin c?.  Ch??m ?a? Zylstra??c ch???m  nng vo vng b? ?nh h??ng.  Thu?c gip: ? Gia?m ?au va? gi?m s?ng. ? Th? l?ng cc c?.  Tim thu?c giu?p gia?m ?au, gia?m ki?ch thch va? gia?m vim quanh dy th?n kinh to?a (steroids).  Ph?u thu?t. Tun th? nh?ng h??ng d?n ny ? nh: Thu?c  Ch? s? d?ng thu?c khng k ??n v thu?c k ??n theo ch? d?n c?a chuyn gia ch?m Veblen s?c kh?e.  Hy h?i chuyn gia ch?m [place] s?c kh?e xem thu?c ???c k ??n cho qu v?: ? Qu v? c c?n ph?i trnh li xe Rakes?c trnh s? d?ng my mc h?ng n?ng hay khng. ? C th? gy to bn hay khng. Qu v? c th? c?n th?c hi?n cc hnh ??ng ny ?? ng?n ng?a Ola?c ?i?u tr? to bn:  U?ng ?? n??c ?? gi? cho n??c ti?u c mu vng nh?t.  Dng thu?c khng k ??n [place]?c thu?c k ??n.  ?n th?c ?n giu ch?t x? nh? ??u, ng? c?c nguyn cm, tri cy t??i v rau.  H?n ch? cc lo?i th?c ?n giu ch?t bo v ???ng tinh luy?n, ch?ng h?n nh? ?? ?n chin/rn [place]?c ?? ng?t. X? tr ?au      N?u ???c ch? d?n, hy ch??m ? l?nh ln vng b? ?nh h??ng. ? Cho ? l?nh vo ti ni lng. ? ?? kh?n t?m ? gi?a da v ti ch??m. ? Ch??m ? l?nh trong 20 pht, 2-3 l?n m?i ngy.  N?u ???c ch? d?n, ch??m nng vo vng b? ?nh h??ng. S? d?ng ngu?n nhi?t m chuyn gia ch?m [place] s?c kh?e khuy?n ngh?, ch?ng h?n nh? ti ch??m nhi?t ?m [place]?c mi?ng ??m ch??m nng. ? ?? kh?n t?m ? gi?a da v ngu?n nhi?t. ? Duy tr ngu?n nhi?t trong 20-30 pht. ? B? ngu?n nhi?t ra n?u da qu v? chuy?n sang mu ?? nh?t. ?i?u ny ??c bi?t quan tr?ng n?u qu v? khng th? c?m th?y ?au, nng, hay l?nh. Qu v? c th? c nguy c? b? b?ng cao h?n. Garringer?t ??ng   Tr? l?i sinh Slusher?t bnh th??ng theo ch? d?n c?a chuyn gia ch?m [place] s?c kh?e. Hy h?i chuyn gia ch?m [place] s?c kh?e v? cc [place]?t ??ng no l an ton cho qu v?.  Trnh nh?ng [place]?t ??ng lm cho cc tri?u ch?ng c?a qu v? tr?m tr?ng h?n.  Nghi? ng?i trong nh??ng khoa?ng th??i gian ng??n trong nga?y. ? Khi quy? vi? nghi? ng?i trong th??i gian da?i, xem l?n va?i hoa?t ??ng nhe?  nha?ng Crispen??c ke?o c?ng gi??a ca?c giai ?oa?n nghi?. Vi?c na?y se? giu?p tra?nh c??ng kh?p va? ?au. ? Tra?nh ng?i trong th??i gian da?i khng c?? ??ng. ??ng d?y va? ?i la?i i?t nh?t m?i gi?? m?t l?n.  T?p th? d?c va? ke?o c?ng th??ng xuyn theo ch? d?n c?a chuyn gia ch?m  s?c kh?e.  Khng nng b?t k? v?t g n?ng h?n 10 lb (4,5 kg) trong khi quy? vi? co? ca?c tri?u ch??ng ?au th?n kinh to?a. Khi khng co? tri?u ch??ng, quy? vi? v?n c?n tra?nh nng v?t n??ng, ???c bi?t la? nng v?t n??ng l??p ?i l??p la?i.  Khi quy? vi? nng v?t n??ng, ha?y lun s?? du?ng ky? thu?t nng phu? h??p, bao g?m: ? Cong ??u g?i. ? Gi?? cho v?t n??ng g?n c? th? quy? vi?. ? Tra?nh v??n ng???i. H??ng d?n chung  Duy tr cn n?ng c l?i cho s?c kh?e. Cn n?ng qu m?c khi?n l?ng qu v?  b? c?ng thm.  ?i gia?y h? tr??, thoa?i ma?i. Tra?nh ?i gia?y cao go?t.  Tra?nh ngu? trn ??m qua? m?m Herder??c qua? c??ng. ??m ?u? c??ng ?? nng ??? l?ng quy? vi? khi quy? vi? ngu? co? th? giu?p lm gia?m ?au.  Tun th? t?t c? cc l?n khm theo di theo ch? d?n c?a chuyn gia ch?m Gholson s?c kh?e. ?i?u ny c vai tr quan tr?ng. Hy lin l?c v?i chuyn gia ch?m Andrew s?c kh?e n?u:  Qu v? b? ?au: ? Khi?n qu v? th?c gi?c lc ?ang ng?. ? D? d?i h?n khi qu v? n?m xu?ng. ? D? d?i h?n l?n ?au qu v? ?a? t??ng bi? tr???c ?y. ? Ke?o da?i h?n 4 tu?n.  Qu v? b? s?t cn khng r nguyn nhn. Yu c?u tr? gip ngay l?p t?c n?u:  Qu v? khng th? ki?m sot khi no qu v? ti?u ti?n Dhaliwal?c ??i ti?n (khng t? ch?).  Qu v? bi?: ? Y?u ?? th??t l?ng, khung ch?u, mng Niedermeier??c chn tr?? nn tr?m tr?ng h?n. ? ?o? [place]abik??c s?ng ?? l?ng. ? Ca?m gia?c no?ng ra?t khi ?i ti?u. Tm t?t  ?au th?n kinh to?a la? ?au, t, y?u Rendleman??c ?au bu?t do?c theo ????ng ?i cu?a dy th?n kinh t?a.  Ti?nh tra?ng na?y xa?y ra khi co? chn p ln dy th?n kinh to?a [place]??c khi dy th?n kinh to?a bi? bo? ch?t.  ?au th?n kinh t?a c th? gy  ?au, t Perezgarcia?c ?au bu?t ? th?t l?ng, chn, hng v mng.  ?i?u tr? th??ng bao g?m ngh? ng?i, t?p th? d?c, thu?c v ch??m ? Parish?c ch??m nng. Thng tin ny khng nh?m m?c ?ch thay th? cho l?i khuyn m chuyn gia ch?m [place] s?c kh?e ni v?i qu v?. Hy b?o ??m qu v? ph?i th?o lu?n b?t k? v?n ?? g m qu v? c v?i chuyn gia ch?m Kerr s?c kh?e c?a qu v?. Document Revised: 02/27/2018 Document Reviewed: 02/27/2018 Elsevier Patient Education  [employer].  If you have lab work done today you will be contacted with your lab results within the next 2 weeks.  If you have not heard from [place] then please contact us. The fastest way to get your results is to register for My Chart.   IF you received an x-ray today, you will receive an invoice from [facility]. Please contact [facility] at [phone] with questions or concerns regarding your invoice.   IF you received labwork today, you will receive an invoice from [place]. Please contact [facility] at [phone] with questions or concerns regarding your invoice.   Our billing staff will not be able to assist you with questions regarding bills from these companies.  You will be contacted with the lab results as soon as they are available. The fastest way to get your results is to activate your My Chart account. Instructions are located on the last page of this paperwork. If you have not heard from [place] regarding the results in 2 weeks, please contact this office.

## 2020-01-31 DIAGNOSIS — B079 Viral wart, unspecified: Secondary | ICD-10-CM | POA: Diagnosis not present

## 2020-02-16 DIAGNOSIS — B079 Viral wart, unspecified: Secondary | ICD-10-CM | POA: Diagnosis not present

## 2020-03-01 DIAGNOSIS — D485 Neoplasm of uncertain behavior of skin: Secondary | ICD-10-CM | POA: Diagnosis not present

## 2020-03-02 DIAGNOSIS — B070 Plantar wart: Secondary | ICD-10-CM | POA: Diagnosis not present

## 2020-03-02 DIAGNOSIS — L281 Prurigo nodularis: Secondary | ICD-10-CM | POA: Diagnosis not present

## 2020-03-15 DIAGNOSIS — M722 Plantar fascial fibromatosis: Secondary | ICD-10-CM | POA: Diagnosis not present

## 2020-04-04 ENCOUNTER — Other Ambulatory Visit: Payer: Self-pay | Admitting: Registered Nurse

## 2020-04-04 DIAGNOSIS — K30 Functional dyspepsia: Secondary | ICD-10-CM

## 2020-04-25 ENCOUNTER — Ambulatory Visit: Payer: Medicare Other | Admitting: Registered Nurse

## 2020-04-26 ENCOUNTER — Ambulatory Visit: Payer: Self-pay | Admitting: Registered Nurse

## 2020-05-02 ENCOUNTER — Ambulatory Visit (INDEPENDENT_AMBULATORY_CARE_PROVIDER_SITE_OTHER): Payer: Medicare Other | Admitting: Registered Nurse

## 2020-05-02 ENCOUNTER — Other Ambulatory Visit: Payer: Self-pay

## 2020-05-02 VITALS — BP 118/64 | HR 63 | Temp 98.4°F | Resp 15 | Ht 63.0 in | Wt 161.2 lb

## 2020-05-02 DIAGNOSIS — I10 Essential (primary) hypertension: Secondary | ICD-10-CM | POA: Diagnosis not present

## 2020-05-02 DIAGNOSIS — E785 Hyperlipidemia, unspecified: Secondary | ICD-10-CM | POA: Diagnosis not present

## 2020-05-02 DIAGNOSIS — M810 Age-related osteoporosis without current pathological fracture: Secondary | ICD-10-CM | POA: Diagnosis not present

## 2020-05-02 DIAGNOSIS — R7303 Prediabetes: Secondary | ICD-10-CM | POA: Diagnosis not present

## 2020-05-02 MED ORDER — ALENDRONATE SODIUM 70 MG PO TABS: 70.0000 mg | ORAL_TABLET | ORAL | 1 refills | Status: DC

## 2020-05-02 MED ORDER — PRAVASTATIN SODIUM 40 MG PO TABS: 40.0000 mg | ORAL_TABLET | Freq: Every day | ORAL | 3 refills | Status: DC

## 2020-05-02 MED ORDER — METOPROLOL SUCCINATE ER 25 MG PO TB24: ORAL_TABLET | ORAL | 1 refills | Status: DC

## 2020-05-03 LAB — LIPID PANEL
Cholesterol: 187 mg/dL (ref 0–200)
HDL: 67.1 mg/dL (ref >39.00)
LDL Cholesterol: 105 mg/dL — ABNORMAL HIGH (ref 0–99)
NonHDL: 120.36
Total CHOL/HDL Ratio: 3
Triglycerides: 77 mg/dL (ref 0.0–149.0)
VLDL: 15.4 mg/dL (ref 0.0–40.0)

## 2020-05-03 LAB — CBC WITH DIFFERENTIAL/PLATELET
Basophils Absolute: 0 10*3/uL (ref 0.0–0.1)
Basophils Relative: 0.7 % (ref 0.0–3.0)
Eosinophils Absolute: 0.2 10*3/uL (ref 0.0–0.7)
Eosinophils Relative: 5.2 % — ABNORMAL HIGH (ref 0.0–5.0)
HCT: 44 % (ref 36.0–46.0)
Hemoglobin: 14.6 g/dL (ref 12.0–15.0)
Lymphocytes Relative: 29.1 % (ref 12.0–46.0)
Lymphs Abs: 1.2 10*3/uL (ref 0.7–4.0)
MCHC: 33.3 g/dL (ref 30.0–36.0)
MCV: 89.6 fl (ref 78.0–100.0)
Monocytes Absolute: 0.5 10*3/uL (ref 0.1–1.0)
Monocytes Relative: 11.2 % (ref 3.0–12.0)
Neutro Abs: 2.2 10*3/uL (ref 1.4–7.7)
Neutrophils Relative %: 53.8 % (ref 43.0–77.0)
Platelets: 128 10*3/uL — ABNORMAL LOW (ref 150.0–400.0)
RBC: 4.91 Mil/uL (ref 3.87–5.11)
RDW: 13.5 % (ref 11.5–15.5)
WBC: 4.2 10*3/uL (ref 4.0–10.5)

## 2020-05-03 LAB — HEMOGLOBIN A1C: Hgb A1c MFr Bld: 6.1 % (ref 4.6–6.5)

## 2020-05-03 LAB — COMPREHENSIVE METABOLIC PANEL
ALT: 16 U/L (ref 0–35)
AST: 23 U/L (ref 0–37)
Albumin: 4.1 g/dL (ref 3.5–5.2)
Alkaline Phosphatase: 36 U/L — ABNORMAL LOW (ref 39–117)
BUN: 18 mg/dL (ref 6–23)
CO2: 29 mEq/L (ref 19–32)
Calcium: 9.6 mg/dL (ref 8.4–10.5)
Chloride: 103 mEq/L (ref 96–112)
Creatinine, Ser: 0.87 mg/dL (ref 0.40–1.20)
GFR: 66.23 mL/min (ref >60.00)
Glucose, Bld: 75 mg/dL (ref 70–99)
Potassium: 3.8 mEq/L (ref 3.5–5.1)
Sodium: 139 mEq/L (ref 135–145)
Total Bilirubin: 1.1 mg/dL (ref 0.2–1.2)
Total Protein: 7.5 g/dL (ref 6.0–8.3)

## 2020-05-03 LAB — VITAMIN D 25 HYDROXY (VIT D DEFICIENCY, FRACTURES): VITD: 35.58 ng/mL (ref 30.00–100.00)

## 2020-05-08 ENCOUNTER — Encounter: Payer: Self-pay | Admitting: Registered Nurse

## 2020-07-12 ENCOUNTER — Ambulatory Visit: Payer: Medicare Other | Admitting: Internal Medicine

## 2020-08-14 ENCOUNTER — Other Ambulatory Visit: Payer: Self-pay | Admitting: Family Medicine

## 2020-08-14 ENCOUNTER — Other Ambulatory Visit: Payer: Self-pay | Admitting: Internal Medicine

## 2020-08-14 DIAGNOSIS — Z1231 Encounter for screening mammogram for malignant neoplasm of breast: Secondary | ICD-10-CM

## 2020-08-24 ENCOUNTER — Telehealth: Payer: Self-pay

## 2020-08-24 NOTE — Telephone Encounter (Signed)
Patient daughter called to confirm the patients appointment.

## 2020-09-27 ENCOUNTER — Other Ambulatory Visit: Payer: Self-pay

## 2020-09-27 ENCOUNTER — Ambulatory Visit (INDEPENDENT_AMBULATORY_CARE_PROVIDER_SITE_OTHER): Payer: Medicare Other

## 2020-09-27 ENCOUNTER — Encounter: Payer: Self-pay | Admitting: Internal Medicine

## 2020-09-27 ENCOUNTER — Ambulatory Visit (INDEPENDENT_AMBULATORY_CARE_PROVIDER_SITE_OTHER): Payer: Medicare Other | Admitting: Internal Medicine

## 2020-09-27 VITALS — BP 122/76 | HR 63 | Temp 98.3°F | Resp 16 | Ht 63.0 in | Wt 163.0 lb

## 2020-09-27 DIAGNOSIS — I471 Supraventricular tachycardia: Secondary | ICD-10-CM

## 2020-09-27 DIAGNOSIS — R0789 Other chest pain: Secondary | ICD-10-CM

## 2020-09-27 DIAGNOSIS — Z23 Encounter for immunization: Secondary | ICD-10-CM | POA: Insufficient documentation

## 2020-09-27 DIAGNOSIS — I10 Essential (primary) hypertension: Secondary | ICD-10-CM

## 2020-09-27 DIAGNOSIS — R7989 Other specified abnormal findings of blood chemistry: Secondary | ICD-10-CM

## 2020-09-27 DIAGNOSIS — E785 Hyperlipidemia, unspecified: Secondary | ICD-10-CM

## 2020-09-27 DIAGNOSIS — R079 Chest pain, unspecified: Secondary | ICD-10-CM | POA: Diagnosis not present

## 2020-09-27 DIAGNOSIS — M810 Age-related osteoporosis without current pathological fracture: Secondary | ICD-10-CM | POA: Diagnosis not present

## 2020-09-27 DIAGNOSIS — I517 Cardiomegaly: Secondary | ICD-10-CM | POA: Diagnosis not present

## 2020-09-27 LAB — CBC WITH DIFFERENTIAL/PLATELET
Basophils Absolute: 0 10*3/uL (ref 0.0–0.1)
Basophils Relative: 0.4 % (ref 0.0–3.0)
Eosinophils Absolute: 0.2 10*3/uL (ref 0.0–0.7)
Eosinophils Relative: 4.2 % (ref 0.0–5.0)
HCT: 43.2 % (ref 36.0–46.0)
Hemoglobin: 14.3 g/dL (ref 12.0–15.0)
Lymphocytes Relative: 29.4 % (ref 12.0–46.0)
Lymphs Abs: 1.3 10*3/uL (ref 0.7–4.0)
MCHC: 33.1 g/dL (ref 30.0–36.0)
MCV: 89.1 fl (ref 78.0–100.0)
Monocytes Absolute: 0.4 10*3/uL (ref 0.1–1.0)
Monocytes Relative: 9.4 % (ref 3.0–12.0)
Neutro Abs: 2.6 10*3/uL (ref 1.4–7.7)
Neutrophils Relative %: 56.6 % (ref 43.0–77.0)
Platelets: 127 10*3/uL — ABNORMAL LOW (ref 150.0–400.0)
RBC: 4.85 Mil/uL (ref 3.87–5.11)
RDW: 13.7 % (ref 11.5–15.5)
WBC: 4.6 10*3/uL (ref 4.0–10.5)

## 2020-09-27 LAB — TSH: TSH: 0.8 u[IU]/mL (ref 0.35–5.50)

## 2020-09-27 LAB — TROPONIN I (HIGH SENSITIVITY): High Sens Troponin I: 5 ng/L (ref 2–17)

## 2020-09-27 LAB — D-DIMER, QUANTITATIVE: D-Dimer, Quant: 0.59 mcg/mL FEU — ABNORMAL HIGH (ref <0.50)

## 2020-09-27 MED ORDER — METOPROLOL SUCCINATE ER 25 MG PO TB24
ORAL_TABLET | ORAL | 0 refills | Status: DC
Start: 2020-09-27 — End: 2021-01-10

## 2020-09-27 MED ORDER — PRAVASTATIN SODIUM 40 MG PO TABS: 40.0000 mg | ORAL_TABLET | Freq: Every day | ORAL | 0 refills | Status: DC

## 2020-09-27 MED ORDER — ALENDRONATE SODIUM 70 MG PO TABS: 70.0000 mg | ORAL_TABLET | ORAL | 0 refills | Status: DC

## 2020-09-27 NOTE — Progress Notes (Signed)
Subjective:  Patient ID: Ariel Harris, female    DOB: Sep 24, 1945  Age: 75 y.o. MRN: [id number]  CC: Chest Pain  This visit occurred during the SARS-CoV-2 public health emergency.  Safety protocols were in place, including screening questions prior to the visit, additional usage of staff PPE, and extensive cleaning of exam room while observing appropriate contact time as indicated for disinfecting solutions.    HPI [place] presents establishing.  She complains of a 16-monthhistory of a sensation of heaviness in the front of her chest.  The pain is initiated by exertion and calisthenics.  Outpatient Medications Prior to Visit  Medication Sig Dispense Refill   acetaminophen (TYLENOL) 500 MG tablet Take 1 tablet (500 mg total) by mouth every 6 (six) hours as needed. 30 tablet 0   alendronate (FOSAMAX) 70 MG tablet Take 1 tablet (70 mg total) by mouth once a week. Take with a full glass of water on an empty stomach. 26 tablet 1   aspirin 81 MG EC tablet TAKE 1 TABLET (81 MG TOTAL) BY MOUTH 2 (TWO) TIMES DAILY. 84 tablet 0   calcium-vitamin D (OSCAL WITH D) 500-200 MG-UNIT tablet Take 1 tablet by mouth daily with breakfast.      diclofenac Sodium (VOLTAREN) 1 % GEL Apply 2 g topically 4 (four) times daily. 50 g 3   metoprolol succinate (TOPROL XL) 25 MG 24 hr tablet Take 1 tablet by mouth daily, may take 1 extra tablet by mouth daily only as needed for palpitations 150 tablet 1   pravastatin (PRAVACHOL) 40 MG tablet Take 1 tablet (40 mg total) by mouth daily. 90 tablet 3   No facility-administered medications prior to visit.    ROS Review of Systems  Constitutional:  Negative for diaphoresis, fatigue and fever.  HENT: Negative.  Negative for sore throat.   Eyes: Negative.   Respiratory:  Negative for cough, chest tightness, shortness of breath and wheezing.   Cardiovascular:  Positive for chest pain. Negative for palpitations and leg swelling.  Gastrointestinal:  Negative for  abdominal pain, nausea and vomiting.  Endocrine: Negative.   Genitourinary:  Negative for difficulty urinating, dysuria and hematuria.  Musculoskeletal:  Negative for back pain, myalgias and neck pain.  Skin: Negative.   Neurological:  Negative for dizziness, weakness, light-headedness and numbness.  Hematological:  Negative for adenopathy. Does not bruise/bleed easily.  Psychiatric/Behavioral: Negative.     Objective:  BP 122/76 (BP Location: Left Arm, Patient Position: Sitting, Cuff Size: Large) Comment: BP (R) 124/72 (L) 122/76  Pulse 63   Temp 98.3 F (36.8 C) (Oral)   Resp 16   Ht '5\' 3"'$  (1.6 m)   Wt 163 lb (73.9 kg)   SpO2 97%   BMI 28.87 kg/m   BP Readings from Last 3 Encounters:  09/27/20 122/76  05/02/20 118/64  01/27/20 136/75    Wt Readings from Last 3 Encounters:  09/27/20 163 lb (73.9 kg)  05/02/20 161 lb 3.2 oz (73.1 kg)  01/27/20 160 lb (72.6 kg)    Physical Exam Vitals reviewed.  HENT:     Nose: Nose normal.     Mouth/Throat:     Mouth: Mucous membranes are moist.  Eyes:     General: No scleral icterus.    Conjunctiva/sclera: Conjunctivae normal.  Cardiovascular:     Rate and Rhythm: Normal rate and regular rhythm.     Heart sounds: Normal heart sounds, S1 normal and S2 normal. No murmur heard.  No friction rub. No gallop.     Comments: EKG- NSR, 60 bpm Normal EKG Pulmonary:     Effort: Pulmonary effort is normal.     Breath sounds: No stridor. No wheezing, rhonchi or rales.  Abdominal:     General: Abdomen is flat.     Palpations: There is no mass.     Tenderness: There is no abdominal tenderness. There is no guarding.     Hernia: No hernia is present.  Musculoskeletal:     Cervical back: Neck supple.     Right lower leg: No edema.     Left lower leg: No edema.  Lymphadenopathy:     Cervical: No cervical adenopathy.  Skin:    General: Skin is warm and dry.  Neurological:     General: No focal deficit present.     Mental Status: She  is alert.    Lab Results  Component Value Date   WBC 4.6 09/27/2020   HGB 14.3 09/27/2020   HCT 43.2 09/27/2020   PLT 127.0 (L) 09/27/2020   GLUCOSE 75 05/02/2020   CHOL 187 05/02/2020   TRIG 77.0 05/02/2020   HDL 67.10 05/02/2020   LDLCALC 105 (H) 05/02/2020   ALT 16 05/02/2020   AST 23 05/02/2020   NA 139 05/02/2020   K 3.8 05/02/2020   CL 103 05/02/2020   CREATININE 0.87 05/02/2020   BUN 18 05/02/2020   CO2 29 05/02/2020   TSH 0.80 09/27/2020   INR 1.1 11/11/2018   HGBA1C 6.1 05/02/2020    MM 3D SCREEN BREAST BILATERAL  Result Date: 12/24/2019 CLINICAL DATA:  Screening. EXAM: DIGITAL SCREENING BILATERAL MAMMOGRAM WITH TOMO AND CAD COMPARISON:  Previous exam(s). ACR Breast Density Category c: The breast tissue is heterogeneously dense, which may obscure small masses. FINDINGS: There are no findings suspicious for malignancy. Images were processed with CAD. IMPRESSION: No mammographic evidence of malignancy. A result letter of this screening mammogram will be mailed directly to the patient. RECOMMENDATION: Screening mammogram in one year. (Code:SM-B-01Y) BI-RADS CATEGORY  1: Negative. Electronically Signed   By: Everlean Alstrom M.D.   On: 12/24/2019 09:17   DG Chest 2 View  Result Date: 09/28/2020 CLINICAL DATA:  Chest pain for 1 month.  Hypertension. EXAM: CHEST - 2 VIEW COMPARISON:  11/11/2018 FINDINGS: Stable mild cardiomegaly and ectasia of the thoracic aorta. Both lungs are clear. IMPRESSION: Stable mild cardiomegaly. No active lung disease. Electronically Signed   By: Marlaine Hind M.D.   On: 09/28/2020 12:36     Assessment & Plan:   Jayden was seen today for chest pain.  Diagnoses and all orders for this visit:  Flu vaccine need -     Flu Vaccine QUAD High Dose(Fluad)  Essential hypertension- Her blood pressure is adequately well controlled. -     metoprolol succinate (TOPROL XL) 25 MG 24 hr tablet; Take 1 tablet by mouth daily, may take 1 extra tablet by mouth  daily only as needed for palpitations -     CBC with Differential/Platelet; Future -     TSH; Future -     EKG 12-Lead -     TSH -     CBC with Differential/Platelet  Dyslipidemia -     pravastatin (PRAVACHOL) 40 MG tablet; Take 1 tablet (40 mg total) by mouth daily.  Age-related osteoporosis without current pathological fracture -     alendronate (FOSAMAX) 70 MG tablet; Take 1 tablet (70 mg total) by mouth once a week. Take with  a full glass of water on an empty stomach.  Chest heaviness- Her diagnostics are remarkable for a mildly elevated D-dimer and a tortuous aorta on the chest x-ray.  I recommended that she undergo a CT angio to screen for PE and aortic aneurysm. -     DG Chest 2 View; Future -     CBC with Differential/Platelet; Future -     Troponin I (High Sensitivity); Future -     D-dimer, quantitative; Future -     D-dimer, quantitative -     Troponin I (High Sensitivity) -     CBC with Differential/Platelet -     CT Angio Chest W/Cm &/Or Wo Cm; Future  SVT (supraventricular tachycardia) (HCC)-her heart rate is well controlled. -     TSH; Future -     TSH  D-dimer, elevated -     CT Angio Chest W/Cm &/Or Wo Cm; Future  Other chest pain -     CT Angio Chest W/Cm &/Or Wo Cm; Future  I have discontinued Maggi M. Curley's calcium-vitamin D, aspirin, diclofenac Sodium, and acetaminophen. I am also having her maintain her metoprolol succinate, pravastatin, and alendronate.  Meds ordered this encounter  Medications   metoprolol succinate (TOPROL XL) 25 MG 24 hr tablet    Sig: Take 1 tablet by mouth daily, may take 1 extra tablet by mouth daily only as needed for palpitations    Dispense:  150 tablet    Refill:  0   pravastatin (PRAVACHOL) 40 MG tablet    Sig: Take 1 tablet (40 mg total) by mouth daily.    Dispense:  90 tablet    Refill:  0   alendronate (FOSAMAX) 70 MG tablet    Sig: Take 1 tablet (70 mg total) by mouth once a week. Take with a full glass of water on an  empty stomach.    Dispense:  12 tablet    Refill:  0     Follow-up: Return in about 4 weeks (around 10/25/2020).  Scarlette Calico, MD

## 2020-09-27 NOTE — Patient Instructions (Signed)
Nonspecific Chest Pain, Adult °Chest pain is an uncomfortable, tight, or painful feeling in the chest. The pain can feel like a crushing, aching, or squeezing pressure. A person can feel a burning or tingling sensation. Chest pain can also be felt in your back, neck, jaw, shoulder, or arm. This pain can be worse when you move, sneeze, or take a deep breath. °Chest pain can be caused by a condition that is life-threatening. This must be treated right away. It can also be caused by something that is not life-threatening. If you have chest pain, it can be hard to know the difference, so it is important to get help right away to make sure that you do not have a serious condition. °Some life-threatening causes of chest pain include: °Heart attack. °A tear in the body's main blood vessel (aortic dissection). °Inflammation around your heart (pericarditis). °A problem in the lungs, such as a blood clot (pulmonary embolism) or a collapsed lung (pneumothorax). °Some non life-threatening causes of chest pain include: °Heartburn. °Anxiety or stress. °Damage to the bones, muscles, and cartilage that make up your chest wall. °Pneumonia or bronchitis. °Shingles infection (varicella-zoster virus). °Your chest pain may come and go. It may also be constant. Your health care provider will do tests and other studies to find the cause of your pain. Treatment will depend on the cause of your chest pain. °Follow these instructions at home: °Medicines °Take over-the-counter and prescription medicines only as told by your health care provider. °If you were prescribed an antibiotic medicine, take it as told by your health care provider. Do not stop taking the antibiotic even if you start to feel better. °Activity °Avoid any activities that cause chest pain. °Do not lift anything that is heavier than 10 lb (4.5 kg), or the limit that you are told, until your health care provider says that it is safe. °Rest as directed by your health care  provider. °Return to your normal activities only as told by your health care provider. Ask your health care provider what activities are safe for you. °Lifestyle °  °Do not use any products that contain nicotine or tobacco, such as cigarettes, e-cigarettes, and chewing tobacco. If you need help quitting, ask your health care provider. °Do not drink alcohol. °Make healthy lifestyle changes as recommended. These may include: °Getting regular exercise. Ask your health care provider to suggest some exercises that are safe for you. °Eating a heart-healthy diet. This includes plenty of fresh fruits and vegetables, whole grains, low-fat (lean) protein, and low-fat dairy products. A dietitian can help you find healthy eating options. °Maintaining a healthy weight. °Managing any other health conditions you may have, such as high blood pressure (hypertension) or diabetes. °Reducing stress, such as with yoga or relaxation techniques. °General instructions °Pay attention to any changes in your symptoms. °It is up to you to get the results of any tests that were done. Ask your health care provider, or the department that is doing the tests, when your results will be ready. °Keep all follow-up visits as told by your health care provider. This is important. °You may be asked to go for further testing if your chest pain does not go away. °Contact a health care provider if: °Your chest pain does not go away. °You feel depressed. °You have a fever. °You notice changes in your symptoms or develop new symptoms. °Get help right away if: °Your chest pain gets worse. °You have a cough that gets worse, or you cough up   blood. °You have severe pain in your abdomen. °You faint. °You have sudden, unexplained chest discomfort. °You have sudden, unexplained discomfort in your arms, back, neck, or jaw. °You have shortness of breath at any time. °You suddenly start to sweat, or your skin gets clammy. °You feel nausea or you vomit. °You suddenly  feel lightheaded or dizzy. °You have severe weakness, or unexplained weakness or fatigue. °Your heart begins to beat quickly, or it feels like it is skipping beats. °These symptoms may represent a serious problem that is an emergency. Do not wait to see if the symptoms will go away. Get medical help right away. Call your local emergency services (911 in the [place] Do not drive yourself to the hospital. °Summary °Chest pain can be caused by a condition that is serious and requires urgent treatment. It may also be caused by something that is not life-threatening. °Your health care provider may do lab tests and other studies to find the cause of your pain. °Follow your health care provider's instructions on taking medicines, making lifestyle changes, and getting emergency treatment if symptoms become worse. °Keep all follow-up visits as told by your health care provider. This includes visits for any further testing if your chest pain does not go away. °This information is not intended to replace advice given to you by your health care provider. Make sure you discuss any questions you have with your health care provider. °Document Revised: 03/30/2020 Document Reviewed: 03/30/2020 °Elsevier Patient Education © 2022 [employer] Inc. ° °

## 2020-09-28 DIAGNOSIS — R7989 Other specified abnormal findings of blood chemistry: Secondary | ICD-10-CM | POA: Insufficient documentation

## 2020-10-20 ENCOUNTER — Ambulatory Visit (INDEPENDENT_AMBULATORY_CARE_PROVIDER_SITE_OTHER): Payer: Medicare Other | Admitting: Internal Medicine

## 2020-10-20 ENCOUNTER — Other Ambulatory Visit: Payer: Self-pay

## 2020-10-20 ENCOUNTER — Encounter: Payer: Self-pay | Admitting: Internal Medicine

## 2020-10-20 VITALS — BP 124/82 | HR 62 | Temp 98.1°F | Resp 16 | Ht 63.0 in | Wt 164.0 lb

## 2020-10-20 DIAGNOSIS — R202 Paresthesia of skin: Secondary | ICD-10-CM

## 2020-10-20 DIAGNOSIS — D696 Thrombocytopenia, unspecified: Secondary | ICD-10-CM | POA: Diagnosis not present

## 2020-10-20 DIAGNOSIS — I10 Essential (primary) hypertension: Secondary | ICD-10-CM | POA: Diagnosis not present

## 2020-10-20 NOTE — Patient Instructions (Signed)
Thrombocytopenia Thrombocytopenia means that you have a low number of platelets in your blood. Platelets are tiny cells in the blood. When you bleed, they clump together at the cut or injury to stop the bleeding. This is called blood clotting. If you do not have enough platelets, it can cause bleeding problems. Some cases of this condition are mild while others are more severe. What are the causes? This condition may be caused by: Your body not making enough platelets. This may be caused by: Your bone marrow not making blood cells (aplastic anemia). Cancer in the bone marrow. Certain medicines. Infection in the bone marrow. Drinking a lot of alcohol. Your body destroying platelets too quickly. This may be caused by: Certain immune diseases. Certain medicines. Certain blood clotting disorders. Certain disorders that are passed from parent to child (inherited). Certain bleeding disorders. Pregnancy. Having a spleen that is larger than normal. What are the signs or symptoms? Bleeding that is not normal. Nosebleeds. Heavy menstrual periods. Blood in the pee (urine) or poop (stool). A purple-like color to the skin (purpura). Bruising. A rash that looks like pinpoint, purple-red spots (petechiae). How is this treated? Treatment of another condition that is causing the low platelet count. Medicines to help protect your platelets from being destroyed. A replacement (transfusion) of platelets to stop or prevent bleeding. Surgery to remove the spleen. Follow these instructions at home: Activity Avoid activities that could cause you to get hurt or bruised. Follow instructions about how to prevent falls. Take care not to cut yourself: When you shave. When you use scissors, needles, knives, or other tools. Take care not to burn yourself: When you use an iron. When you cook. General instructions  Check your skin and the inside of your mouth for bruises or blood as told by your  doctor. Check to see if there is blood in your spit (sputum), pee, and poop. Do this as told by your doctor. Do not drink alcohol. Take over-the-counter and prescription medicines only as told by your doctor. Do not take any medicines that have aspirin or NSAIDs in them. These medicines can thin your blood and cause you to bleed. Tell all of your doctors that you have this condition. Be sure to tell your dentist and eye doctor too. Contact a doctor if: You have bruises and you do not know why. Get help right away if: You are bleeding anywhere on your body. You have blood in your spit, pee, or poop. Summary Thrombocytopenia means that you have a low number of platelets in your blood. Platelets are needed for blood clotting. Symptoms of this condition include bleeding that is not normal, and bruising. Take care not to cut or burn yourself. This information is not intended to replace advice given to you by your health care provider. Make sure you discuss any questions you have with your health care provider. Document Revised: 04/25/2020 Document Reviewed: 10/16/2017 Elsevier Patient Education  [place].

## 2020-10-20 NOTE — Progress Notes (Signed)
Subjective:  Patient ID: Ariel Harris, female    DOB: 13-Nov-1945  Age: 75 y.o. MRN: [id number]  CC: Follow-up  This visit occurred during the SARS-CoV-2 public health emergency.  Safety protocols were in place, including screening questions prior to the visit, additional usage of staff PPE, and extensive cleaning of exam room while observing appropriate contact time as indicated for disinfecting solutions.    HPI Ariel Harris presents for f/up -  She has had no more episodes of chest pain.  She denies cough, shortness of breath, bleeding, or bruising.  She tells me her paresthesias are getting better.  Outpatient Medications Prior to Visit  Medication Sig Dispense Refill   alendronate (FOSAMAX) 70 MG tablet Take 1 tablet (70 mg total) by mouth once a week. Take with a full glass of water on an empty stomach. 12 tablet 0   metoprolol succinate (TOPROL XL) 25 MG 24 hr tablet Take 1 tablet by mouth daily, may take 1 extra tablet by mouth daily only as needed for palpitations 150 tablet 0   pravastatin (PRAVACHOL) 40 MG tablet Take 1 tablet (40 mg total) by mouth daily. 90 tablet 0   No facility-administered medications prior to visit.    ROS Review of Systems  Constitutional:  Negative for diaphoresis and fatigue.  HENT: Negative.    Eyes: Negative.   Respiratory:  Negative for cough, chest tightness, shortness of breath and wheezing.   Cardiovascular:  Negative for chest pain, palpitations and leg swelling.  Gastrointestinal:  Negative for abdominal pain, blood in stool, constipation, diarrhea, nausea and vomiting.  Endocrine: Negative.   Genitourinary: Negative.  Negative for difficulty urinating and hematuria.  Musculoskeletal: Negative.  Negative for arthralgias.  Skin: Negative.  Negative for color change and pallor.  Neurological:  Positive for numbness. Negative for dizziness and weakness.  Hematological:  Negative for adenopathy. Does not bruise/bleed easily.   Psychiatric/Behavioral: Negative.     Objective:  BP 124/82 (BP Location: Left Arm, Patient Position: Sitting, Cuff Size: Large)   Pulse 62   Temp 98.1 F (36.7 C) (Oral)   Resp 16   Ht 5\' 3"  (1.6 m)   Wt 164 lb (74.4 kg)   SpO2 96%   BMI 29.05 kg/m   BP Readings from Last 3 Encounters:  10/20/20 124/82  09/27/20 122/76  05/02/20 118/64    Wt Readings from Last 3 Encounters:  10/20/20 164 lb (74.4 kg)  09/27/20 163 lb (73.9 kg)  05/02/20 161 lb 3.2 oz (73.1 kg)    Physical Exam Vitals reviewed.  Constitutional:      Appearance: Normal appearance.  HENT:     Nose: Nose normal.     Mouth/Throat:     Mouth: Mucous membranes are moist.  Eyes:     Conjunctiva/sclera: Conjunctivae normal.  Cardiovascular:     Rate and Rhythm: Normal rate and regular rhythm.     Heart sounds: No murmur heard. Pulmonary:     Effort: Pulmonary effort is normal.     Breath sounds: No stridor. No wheezing, rhonchi or rales.  Abdominal:     General: Abdomen is flat. Bowel sounds are normal. There is no distension.     Palpations: Abdomen is soft. There is no hepatomegaly, splenomegaly or mass.     Tenderness: There is no abdominal tenderness.  Musculoskeletal:        General: Normal range of motion.     Cervical back: Neck supple.     Right  lower leg: No edema.     Left lower leg: No edema.  Lymphadenopathy:     Cervical: No cervical adenopathy.  Skin:    General: Skin is warm and dry.     Coloration: Skin is not jaundiced.     Findings: No bruising.  Neurological:     General: No focal deficit present.     Mental Status: She is alert.    Lab Results  Component Value Date   WBC 4.6 09/27/2020   HGB 14.3 09/27/2020   HCT 43.2 09/27/2020   PLT 127.0 (L) 09/27/2020   GLUCOSE 75 05/02/2020   CHOL 187 05/02/2020   TRIG 77.0 05/02/2020   HDL 67.10 05/02/2020   LDLCALC 105 (H) 05/02/2020   ALT 16 05/02/2020   AST 23 05/02/2020   NA 139 05/02/2020   K 3.8 05/02/2020   CL 103  05/02/2020   CREATININE 0.87 05/02/2020   BUN 18 05/02/2020   CO2 29 05/02/2020   TSH 0.80 09/27/2020   INR 1.1 11/11/2018   HGBA1C 6.1 05/02/2020    MM 3D SCREEN BREAST BILATERAL  Result Date: 12/24/2019 CLINICAL DATA:  Screening. EXAM: DIGITAL SCREENING BILATERAL MAMMOGRAM WITH TOMO AND CAD COMPARISON:  Previous exam(s). ACR Breast Density Category c: The breast tissue is heterogeneously dense, which may obscure small masses. FINDINGS: There are no findings suspicious for malignancy. Images were processed with CAD. IMPRESSION: No mammographic evidence of malignancy. A result letter of this screening mammogram will be mailed directly to the patient. RECOMMENDATION: Screening mammogram in one year. (Code:SM-B-01Y) BI-RADS CATEGORY  1: Negative. Electronically Signed   By: Everlean Alstrom M.D.   On: 12/24/2019 09:17    Assessment & Plan:   Ariel Harris was seen today for follow-up.  Diagnoses and all orders for this visit:  Primary hypertension- Her blood pressure is adequately well controlled. -     CBC with Differential/Platelet; Future -     Vitamin B12; Future -     Folate; Future  Paresthesia- I will screen for B12 and folate deficiency. -     CBC with Differential/Platelet; Future -     Vitamin B12; Future -     Folate; Future  Thrombocytopenia ([place])- I will recheck her platelet count and will screen for B12 and folate deficiencies. -     CBC with Differential/Platelet; Future -     Vitamin B12; Future -     Folate; Future  I am having Ariel Harris maintain her metoprolol succinate, pravastatin, and alendronate.  No orders of the defined types were placed in this encounter.    Follow-up: Return in about 3 months (around 01/19/2021).  Ariel Calico, MD

## 2020-11-06 ENCOUNTER — Other Ambulatory Visit: Payer: Self-pay

## 2020-11-06 ENCOUNTER — Ambulatory Visit
Admission: RE | Admit: 2020-11-06 | Discharge: 2020-11-06 | Disposition: A | Discharge status: Home / Self Care | Payer: Medicare Other | Source: Ambulatory Visit | Attending: Internal Medicine | Admitting: Internal Medicine

## 2020-11-06 ENCOUNTER — Encounter: Payer: Self-pay | Admitting: Internal Medicine

## 2020-11-06 ENCOUNTER — Other Ambulatory Visit: Payer: Self-pay | Admitting: Registered Nurse

## 2020-11-06 DIAGNOSIS — R0789 Other chest pain: Secondary | ICD-10-CM

## 2020-11-06 DIAGNOSIS — F32A Depression, unspecified: Secondary | ICD-10-CM

## 2020-11-06 DIAGNOSIS — R7989 Other specified abnormal findings of blood chemistry: Secondary | ICD-10-CM

## 2020-11-06 MED ORDER — IOPAMIDOL (ISOVUE-370) INJECTION 76%
75.0000 mL | Freq: Once | INTRAVENOUS | Status: AC | PRN
  Administered 2020-11-06: 75 mL via INTRAVENOUS

## 2020-11-06 NOTE — Telephone Encounter (Signed)
Patient is requesting a refill of the following medications: Requested Prescriptions   Pending Prescriptions Disp Refills   escitalopram (LEXAPRO) 10 MG tablet [Pharmacy Med Name: ESCITALOPRAM 10 MG TABLET] 90 tablet 3    Sig: TAKE 1 TABLET BY MOUTH EVERY DAY    Date of patient request: 11/05/2020 Last office visit: 05/02/2020 Date of last refill: 10/19/2019 Last refill amount: 90 tablets 3 refills  Follow up time period per chart: none upcoming patient has a new PCP but that appointment is in December.

## 2020-11-08 ENCOUNTER — Other Ambulatory Visit: Payer: Self-pay

## 2020-11-08 ENCOUNTER — Ambulatory Visit: Payer: Self-pay

## 2020-11-08 ENCOUNTER — Ambulatory Visit (INDEPENDENT_AMBULATORY_CARE_PROVIDER_SITE_OTHER): Payer: Medicare Other | Admitting: Orthopaedic Surgery

## 2020-11-08 ENCOUNTER — Encounter: Payer: Self-pay | Admitting: Orthopaedic Surgery

## 2020-11-08 DIAGNOSIS — Z96652 Presence of left artificial knee joint: Secondary | ICD-10-CM | POA: Diagnosis not present

## 2020-11-08 DIAGNOSIS — Z96651 Presence of right artificial knee joint: Secondary | ICD-10-CM

## 2020-11-08 NOTE — Progress Notes (Signed)
Office Visit Note   Patient: Ariel Harris           Date of Birth: Dec 02, 1945           MRN: [id number] Visit Date: 11/08/2020              Requested by: Maximiano Coss, NP [address] [place],  [place] [id number] PCP: Janith Lima, MD   Assessment & Plan: Visit Diagnoses:  1. S/P TKR (total knee replacement), left   2. Status post total right knee replacement     Plan: The patient has done very well from her knee replacements and she is very happy with her outcome.  At this point she can follow-up with [place] as needed.  Follow-Up Instructions: Return if symptoms worsen or fail to improve.   Orders:  Orders Placed This Encounter  Procedures   XR Knee 1-2 Views Right   XR Knee 1-2 Views Left   No orders of the defined types were placed in this encounter.     Procedures: No procedures performed   Clinical Data: No additional findings.   Subjective: Chief Complaint  Patient presents with   Right Knee - Pain   Left Knee - Pain    HPI  Patient is a very pleasant 75 year old via me is a lady who is status post right total knee replacement in April 2019 and left total knee replacement in October 2020.  She has no complaints regarding her knee replacements.  She is doing great and she is so happy that she is walking better and has had a significant improvement in quality of life.  Interpreter present today.  Review of Systems   Objective: Vital Signs: There were no vitals taken for this visit.  Physical Exam  Ortho Exam  Bilateral knees show fully healed surgical scars.  She has excellent range of motion.  Stable to varus valgus.  No joint effusion.  Normal gait and ambulation.  Specialty Comments:  No specialty comments available.  Imaging: XR Knee 1-2 Views Left  Result Date: 11/08/2020 Stable total knee replacement in good alignment.   XR Knee 1-2 Views Right  Result Date: 11/08/2020 Stable total knee replacement in good alignment      PMFS History: Patient Active Problem List   Diagnosis Date Noted   Thrombocytopenia ([employer]) 10/20/2020   Paresthesia 03/22/2019   SVT (supraventricular tachycardia) ([place]) 07/09/2018   Osteoporosis 05/25/2017   Hypertension 01/12/2010   Past Medical History:  Diagnosis Date   Arthritis    "knees" (05/16/2017)   Chest pain    Hyperlipidemia    Hypertension    PAC (premature atrial contraction)    SVT (supraventricular tachycardia) ([facility])     Family History  Problem Relation Age of Onset   Coronary artery disease Neg Hx    Heart attack Neg Hx    Breast cancer Neg Hx     Past Surgical History:  Procedure Laterality Date   JOINT REPLACEMENT     KNEE ARTHROSCOPY Left 1996   KNEE SURGERY Right [id number]   "open surgery; because of the pain"   TOTAL KNEE ARTHROPLASTY Right 05/16/2017   TOTAL KNEE ARTHROPLASTY Right 05/16/2017   Procedure: RIGHT TOTAL KNEE ARTHROPLASTY;  Surgeon: Leandrew Koyanagi, MD;  Location: [facility];  Service: Orthopedics;  Laterality: Right;   TOTAL KNEE ARTHROPLASTY Left 11/16/2018   Procedure: LEFT TOTAL KNEE ARTHROPLASTY;  Surgeon: Leandrew Koyanagi, MD;  Location: [facility];  Service: Orthopedics;  Laterality: Left;   Social History   Occupational History   Not on file  Tobacco Use   Smoking status: Never   Smokeless tobacco: Never  Vaping Use   Vaping Use: Never used  Substance and Sexual Activity   Alcohol use: No   Drug use: No   Sexual activity: Not Currently

## 2020-11-18 NOTE — Progress Notes (Signed)
Established Patient Office Visit  Subjective:  Patient ID: Ariel Harris, female    DOB: 10-21-1945  Age: 75 y.o. MRN: [id number]  CC:  Chief Complaint  Patient presents with  . Follow-up    3 month follow up and discuss doing a bone density, pt saw bone specialist and had a recent check up and that went well. Pt also reports has been taking fosamax and has helped her would like a refill but has not been prescribed by our office     HPI [name] presents for follow up   Recent dexa - results not available. Pt will request they are sent. Has been on fosamax for osteoporosis in past. Good effect. Would like refill.  Has been on metoprolol for PACs and SVT. Good effect, no AE  HLD - taking pravastatin 40mg  po qd with good effect. Needs refill.  Otherwise requesting labs. Reviewed last labs with pt.   Past Medical History:  Diagnosis Date  . Arthritis    "knees" (05/16/2017)  . Chest pain   . Hyperlipidemia   . Hypertension   . PAC (premature atrial contraction)   . SVT (supraventricular tachycardia) (HCC)     Past Surgical History:  Procedure Laterality Date  . JOINT REPLACEMENT    . KNEE ARTHROSCOPY Left 1996  . KNEE SURGERY Right [phone]   "open surgery; because of the pain"  . 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 TOTAL KNEE ARTHROPLASTY Right 05/16/2017   Procedure: RIGHT TOTAL KNEE ARTHROPLASTY;  Surgeon: Leandrew Koyanagi, MD;  Location: [facility];  Service: Orthopedics;  Laterality: Right;  . TOTAL KNEE ARTHROPLASTY Left 11/16/2018   Procedure: LEFT TOTAL KNEE ARTHROPLASTY;  Surgeon: Leandrew Koyanagi, MD;  Location: [facility];  Service: Orthopedics;  Laterality: Left;    Family History  Problem Relation Age of Onset  . Coronary artery disease Neg Hx   . Heart attack Neg Hx   . Breast cancer Neg Hx     Social History   Socioeconomic History  . Marital status: Divorced    Spouse name: Not on file  . Number of children: 2  . Years of education: Not on  file  . Highest education level: Not on file  Occupational History  . Not on file  Tobacco Use  . Smoking status: Never  . Smokeless tobacco: Never  Vaping Use  . Vaping Use: Never used  Substance and Sexual Activity  . Alcohol use: No  . Drug use: No  . Sexual activity: Not Currently  Other Topics Concern  . Not on file  Social History Narrative   Negative family `history--Specifically artery disease or MI. The patient has two siblings died at a young age from fevers.   The patient is separated and single. She is retired but used to work at a [employer] and a [employer]. She is independent of daily living. She has never drink or smoke or done drugs   Social Determinants of Health   Financial Resource Strain: Not on file  Food Insecurity: Not on file  Transportation Needs: Not on file  Physical Activity: Not on file  Stress: Not on file  Social Connections: Not on file  Intimate Partner Violence: Not on file    Outpatient Medications Prior to Visit  Medication Sig Dispense Refill  . acetaminophen (TYLENOL) 500 MG tablet Take 1 tablet (500 mg total) by mouth every 6 (six) hours as needed. 30 tablet 0  . alendronate (FOSAMAX) 70  MG tablet Take 70 mg by mouth once a week. Take with a full glass of water on an empty stomach.    . calcium-vitamin D (OSCAL WITH D) 500-200 MG-UNIT tablet Take 1 tablet by mouth daily with breakfast.     . diclofenac Sodium (VOLTAREN) 1 % GEL Apply 2 g topically 4 (four) times daily. 50 g 3  . metoprolol succinate (TOPROL XL) 25 MG 24 hr tablet Take 1 tablet by mouth daily, may take 1 extra tablet by mouth daily only as needed for palpitations 150 tablet 1  . pravastatin (PRAVACHOL) 40 MG tablet Take 1 tablet (40 mg total) by mouth daily. 90 tablet 3  . aspirin 81 MG EC tablet TAKE 1 TABLET (81 MG TOTAL) BY MOUTH 2 (TWO) TIMES DAILY. 84 tablet 0   No facility-administered medications prior to visit.    Allergies  Allergen Reactions  . Indomethacin  Swelling  . Tramadol     Sweaty, made feel bad    ROS Review of Systems  Constitutional: Negative.   HENT: Negative.    Eyes: Negative.   Respiratory: Negative.    Cardiovascular: Negative.   Gastrointestinal: Negative.   Genitourinary: Negative.   Musculoskeletal: Negative.   Skin: Negative.   Neurological: Negative.   Psychiatric/Behavioral: Negative.    All other systems reviewed and are negative.    Objective:    Physical Exam Vitals and nursing note reviewed.  Constitutional:      General: She is not in acute distress.    Appearance: Normal appearance. She is normal weight. She is not ill-appearing, toxic-appearing or diaphoretic.  Cardiovascular:     Rate and Rhythm: Normal rate and regular rhythm.     Heart sounds: Normal heart sounds. No murmur heard.   No friction rub. No gallop.  Pulmonary:     Effort: Pulmonary effort is normal. No respiratory distress.     Breath sounds: Normal breath sounds. No stridor. No wheezing, rhonchi or rales.  Chest:     Chest wall: No tenderness.  Skin:    General: Skin is warm and dry.  Neurological:     General: No focal deficit present.     Mental Status: She is alert and oriented to person, place, and time. Mental status is at baseline.  Psychiatric:        Mood and Affect: Mood normal.        Behavior: Behavior normal.        Thought Content: Thought content normal.        Judgment: Judgment normal.    BP 118/64   Pulse 63   Temp 98.4 F (36.9 C) (Temporal)   Resp 15   Ht 5\' 3"  (1.6 m)   Wt 161 lb 3.2 oz (73.1 kg)   SpO2 98%   BMI 28.56 kg/m  Wt Readings from Last 3 Encounters:  10/20/20 164 lb (74.4 kg)  09/27/20 163 lb (73.9 kg)  05/02/20 161 lb 3.2 oz (73.1 kg)     Health Maintenance Due  Topic Date Due  . COLONOSCOPY (Pts 45-19yrs Insurance coverage will need to be confirmed)  Never done  . Zoster Vaccines- Shingrix (1 of 2) Never done  . Pneumonia Vaccine 97+ Years old (2 - PPSV23 if available, else  PCV20) 12/22/2017  . COVID-19 Vaccine (3 - Booster for Pfizer series) 05/25/2019    There are no preventive care reminders to display for this patient.  Lab Results  Component Value Date   TSH 0.80 09/27/2020  Lab Results  Component Value Date   WBC 4.6 09/27/2020   HGB 14.3 09/27/2020   HCT 43.2 09/27/2020   MCV 89.1 09/27/2020   PLT 127.0 (L) 09/27/2020   Lab Results  Component Value Date   NA 139 05/02/2020   K 3.8 05/02/2020   CO2 29 05/02/2020   GLUCOSE 75 05/02/2020   BUN 18 05/02/2020   CREATININE 0.87 05/02/2020   BILITOT 1.1 05/02/2020   ALKPHOS 36 (L) 05/02/2020   AST 23 05/02/2020   ALT 16 05/02/2020   PROT 7.5 05/02/2020   ALBUMIN 4.1 05/02/2020   CALCIUM 9.6 05/02/2020   ANIONGAP 6 11/17/2018   GFR 66.23 05/02/2020   Lab Results  Component Value Date   CHOL 187 05/02/2020   Lab Results  Component Value Date   HDL 67.10 05/02/2020   Lab Results  Component Value Date   LDLCALC 105 (H) 05/02/2020   Lab Results  Component Value Date   TRIG 77.0 05/02/2020   Lab Results  Component Value Date   CHOLHDL 3 05/02/2020   Lab Results  Component Value Date   HGBA1C 6.1 05/02/2020      Assessment & Plan:   Problem List Items Addressed This Visit       Musculoskeletal and Integument   Osteoporosis - Primary   Relevant Orders   Vitamin D (25 hydroxy) (Completed)     Other   RESOLVED: Dyslipidemia   Other Visit Diagnoses     Essential hypertension       Relevant Orders   Comprehensive metabolic panel (Completed)   Lipid panel (Completed)   CBC with Differential/Platelet (Completed)   Prediabetes       Relevant Orders   Comprehensive metabolic panel (Completed)   Hemoglobin A1c (Completed)       Meds ordered this encounter  Medications  . DISCONTD: pravastatin (PRAVACHOL) 40 MG tablet    Sig: Take 1 tablet (40 mg total) by mouth daily.    Dispense:  90 tablet    Refill:  3  . DISCONTD: metoprolol succinate (TOPROL XL) 25 MG  24 hr tablet    Sig: Take 1 tablet by mouth daily, may take 1 extra tablet by mouth daily only as needed for palpitations    Dispense:  150 tablet    Refill:  1  . DISCONTD: alendronate (FOSAMAX) 70 MG tablet    Sig: Take 1 tablet (70 mg total) by mouth once a week. Take with a full glass of water on an empty stomach.    Dispense:  26 tablet    Refill:  1    Follow-up: No follow-ups on file.   PLAN Labs collected. Will follow up with the patient as warranted. Refill meds as above. Return in 3 mo or sooner. If pt desires, she may est with another office - she notes that this is a far drive for her from home Patient encouraged to call clinic with any questions, comments, or concerns.  Maximiano Coss, NP

## 2020-12-09 ENCOUNTER — Emergency Department (HOSPITAL_COMMUNITY): Payer: Medicare Other

## 2020-12-09 ENCOUNTER — Emergency Department (HOSPITAL_COMMUNITY)
Admission: EM | Admit: 2020-12-09 | Discharge: 2020-12-09 | Disposition: A | Discharge status: Home / Self Care | Payer: Medicare Other | Attending: Emergency Medicine | Admitting: Emergency Medicine

## 2020-12-09 ENCOUNTER — Encounter (HOSPITAL_COMMUNITY): Payer: Self-pay

## 2020-12-09 DIAGNOSIS — Z79899 Other long term (current) drug therapy: Secondary | ICD-10-CM | POA: Diagnosis not present

## 2020-12-09 DIAGNOSIS — I10 Essential (primary) hypertension: Secondary | ICD-10-CM | POA: Diagnosis not present

## 2020-12-09 DIAGNOSIS — Z96653 Presence of artificial knee joint, bilateral: Secondary | ICD-10-CM | POA: Insufficient documentation

## 2020-12-09 DIAGNOSIS — R519 Headache, unspecified: Secondary | ICD-10-CM | POA: Diagnosis not present

## 2020-12-09 DIAGNOSIS — M26622 Arthralgia of left temporomandibular joint: Secondary | ICD-10-CM | POA: Diagnosis not present

## 2020-12-09 DIAGNOSIS — M542 Cervicalgia: Secondary | ICD-10-CM | POA: Diagnosis not present

## 2020-12-09 DIAGNOSIS — E042 Nontoxic multinodular goiter: Secondary | ICD-10-CM | POA: Diagnosis not present

## 2020-12-09 DIAGNOSIS — I6523 Occlusion and stenosis of bilateral carotid arteries: Secondary | ICD-10-CM | POA: Diagnosis not present

## 2020-12-09 DIAGNOSIS — I672 Cerebral atherosclerosis: Secondary | ICD-10-CM | POA: Diagnosis not present

## 2020-12-09 LAB — CBC WITH DIFFERENTIAL/PLATELET
Abs Immature Granulocytes: 0.01 10*3/uL (ref 0.00–0.07)
Basophils Absolute: 0 10*3/uL (ref 0.0–0.1)
Basophils Relative: 1 %
Eosinophils Absolute: 0.2 10*3/uL (ref 0.0–0.5)
Eosinophils Relative: 4 %
HCT: 46.5 % — ABNORMAL HIGH (ref 36.0–46.0)
Hemoglobin: 15 g/dL (ref 12.0–15.0)
Immature Granulocytes: 0 %
Lymphocytes Relative: 35 %
Lymphs Abs: 1.5 10*3/uL (ref 0.7–4.0)
MCH: 29 pg (ref 26.0–34.0)
MCHC: 32.3 g/dL (ref 30.0–36.0)
MCV: 89.8 fL (ref 80.0–100.0)
Monocytes Absolute: 0.4 10*3/uL (ref 0.1–1.0)
Monocytes Relative: 10 %
Neutro Abs: 2.1 10*3/uL (ref 1.7–7.7)
Neutrophils Relative %: 50 %
Platelets: 127 10*3/uL — ABNORMAL LOW (ref 150–400)
RBC: 5.18 MIL/uL — ABNORMAL HIGH (ref 3.87–5.11)
RDW: 12.8 % (ref 11.5–15.5)
WBC: 4.2 10*3/uL (ref 4.0–10.5)
nRBC: 0 % (ref 0.0–0.2)

## 2020-12-09 LAB — COMPREHENSIVE METABOLIC PANEL
ALT: 16 U/L (ref 0–44)
AST: 23 U/L (ref 15–41)
Albumin: 4.3 g/dL (ref 3.5–5.0)
Alkaline Phosphatase: 38 U/L (ref 38–126)
Anion gap: 6 (ref 5–15)
BUN: 15 mg/dL (ref 8–23)
CO2: 26 mmol/L (ref 22–32)
Calcium: 9.2 mg/dL (ref 8.9–10.3)
Chloride: 106 mmol/L (ref 98–111)
Creatinine, Ser: 0.84 mg/dL (ref 0.44–1.00)
GFR, Estimated: >60 mL/min (ref >60)
Glucose, Bld: 88 mg/dL (ref 70–99)
Potassium: 3.5 mmol/L (ref 3.5–5.1)
Sodium: 138 mmol/L (ref 135–145)
Total Bilirubin: 1.5 mg/dL — ABNORMAL HIGH (ref 0.3–1.2)
Total Protein: 8.3 g/dL — ABNORMAL HIGH (ref 6.5–8.1)

## 2020-12-09 MED ORDER — IOHEXOL 350 MG/ML SOLN
80.0000 mL | Freq: Once | INTRAVENOUS | Status: AC | PRN
  Administered 2020-12-09: 80 mL via INTRAVENOUS

## 2020-12-09 MED ORDER — CYCLOBENZAPRINE HCL 10 MG PO TABS: 10.0000 mg | ORAL_TABLET | Freq: Every day | ORAL | 0 refills | Status: AC

## 2020-12-09 NOTE — ED Provider Notes (Signed)
[facility] DEPT Provider Note   CSN: [id number] Arrival date & time: 12/09/20  1111     History Chief Complaint  Patient presents with   Facial Pain   Facial Swelling    Ariel Harris is a 75 y.o. female.  The history is provided by the patient and medical records. The history is limited by a language barrier. A language interpreter was used.  Headache Pain location:  L parietal and L temporal Radiates to:  L neck Pain severity now: severe. Onset quality:  Gradual Duration:  3 days Timing:  Constant Progression:  Unchanged Chronicity:  Recurrent Similar to prior headaches: yes   Context: eating   Relieved by:  Nothing Worsened by:  Neck movement Ineffective treatments:  None tried Associated symptoms: facial pain and neck pain   Associated symptoms: no abdominal pain, no back pain, no blurred vision, no congestion, no cough, no diarrhea, no fatigue, no fever, no focal weakness, no hearing loss, no loss of balance, no myalgias, no nausea, no near-syncope, no neck stiffness, no numbness, no paresthesias, no photophobia, no seizures, no sinus pressure, no URI, no visual change, no vomiting and no weakness       Past Medical History:  Diagnosis Date   Arthritis    "knees" (05/16/2017)   Chest pain    Hyperlipidemia    Hypertension    PAC (premature atrial contraction)    SVT (supraventricular tachycardia) ([place])     Patient Active Problem List   Diagnosis Date Noted   Thrombocytopenia ([place]) 10/20/2020   Paresthesia 03/22/2019   SVT (supraventricular tachycardia) ([place]) 07/09/2018   Osteoporosis 05/25/2017   Hypertension 01/12/2010    Past Surgical History:  Procedure Laterality Date   JOINT REPLACEMENT     KNEE ARTHROSCOPY Left 1996   KNEE SURGERY Right [phone]   "open surgery; because of the pain"   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     Family History  Problem Relation Age of Onset   Coronary artery disease Neg Hx    Heart attack Neg Hx    Breast cancer Neg Hx     Social History   Tobacco Use   Smoking status: Never   Smokeless tobacco: Never  Vaping Use   Vaping Use: Never used  Substance Use Topics   Alcohol use: No   Drug use: No    Home Medications Prior to Admission medications   Medication Sig Start Date End Date Taking? Authorizing Provider  alendronate (FOSAMAX) 70 MG tablet Take 1 tablet (70 mg total) by mouth once a week. Take with a full glass of water on an empty stomach. 09/27/20   Janith Lima, MD  metoprolol succinate (TOPROL XL) 25 MG 24 hr tablet Take 1 tablet by mouth daily, may take 1 extra tablet by mouth daily only as needed for palpitations 09/27/20   Janith Lima, MD  pravastatin (PRAVACHOL) 40 MG tablet Take 1 tablet (40 mg total) by mouth daily. 09/27/20   Janith Lima, MD    Allergies  Indomethacin and Tramadol  Review of Systems   Review of Systems  Constitutional:  Negative for chills, fatigue and fever.  HENT:  Negative for congestion, hearing loss, sinus pressure, tinnitus and trouble swallowing.   Eyes:  Negative for blurred vision and photophobia.  Respiratory:  Negative for cough.   Cardiovascular:  Negative for chest pain and near-syncope.  Gastrointestinal:  Negative for abdominal pain, diarrhea, nausea and vomiting.  Genitourinary:  Negative for dysuria and flank pain.  Musculoskeletal:  Positive for neck pain. Negative for back pain, myalgias and neck stiffness.  Skin:  Negative for rash and wound.  Neurological:  Positive for  headaches. Negative for focal weakness, seizures, weakness, numbness, paresthesias and loss of balance.  Psychiatric/Behavioral:  Negative for agitation.    Physical Exam Updated Vital Signs BP (!) 153/100   Pulse 64   Temp 97.9 F (36.6 C) (Oral)   Resp 16   SpO2 99%   Physical Exam Vitals and nursing note reviewed.  Constitutional:      General: She is not in acute distress.    Appearance: She is well-developed. She is not ill-appearing, toxic-appearing or diaphoretic.  HENT:     Head: Normocephalic and atraumatic.     Nose: No congestion or rhinorrhea.     Mouth/Throat:     Mouth: Mucous membranes are moist.     Pharynx: No oropharyngeal exudate or posterior oropharyngeal erythema.  Eyes:     Extraocular Movements: Extraocular movements intact.     Conjunctiva/sclera: Conjunctivae normal.     Pupils: Pupils are equal, round, and reactive to light.  Neck:     Vascular: No carotid bruit.  Cardiovascular:     Rate and Rhythm: Normal rate and regular rhythm.     Pulses: Normal pulses.     Heart sounds: No murmur heard. Pulmonary:     Effort: Pulmonary effort is normal. No respiratory distress.     Breath sounds: Normal breath sounds.  Abdominal:     General: Abdomen is flat.     Palpations: Abdomen is soft.     Tenderness: There is no abdominal tenderness. There is no right CVA tenderness, guarding or rebound.  Musculoskeletal:        General: Tenderness present.     Cervical back: Neck supple. Tenderness present.     Right lower leg: No edema.     Left lower leg: No edema.  Skin:    General: Skin is warm and dry.     Capillary Refill: Capillary refill takes less than 2 seconds.     Findings: No rash.  Neurological:     General: No focal deficit present.     Mental Status: She is alert and oriented to person, place, and time.     Cranial Nerves: No cranial nerve deficit.     Sensory: No sensory deficit.     Motor: No weakness.     Coordination: Coordination  normal.  Psychiatric:        Mood and Affect: Mood normal.    ED Results / Procedures / Treatments   Labs (all labs ordered are listed, but only abnormal results are displayed) Labs Reviewed  CBC WITH DIFFERENTIAL/PLATELET - Abnormal; Notable for the following components:      Result Value   RBC 5.18 (*)    HCT 46.5 (*)    Platelets 127 (*)    All other components within normal limits  COMPREHENSIVE METABOLIC PANEL - Abnormal; Notable for the following components:   Total  Protein 8.3 (*)    Total Bilirubin 1.5 (*)    All other components within normal limits    EKG None  Radiology CT Angio Head W or Wo Contrast  Result Date: 12/09/2020 CLINICAL DATA:  Initial evaluation for acute pain involving the left head/face/neck. Not the/F somewhat function read viewed at a are/ EXAM: CT ANGIOGRAPHY HEAD AND NECK TECHNIQUE: Multidetector CT imaging of the head and neck was performed using the standard protocol during bolus administration of intravenous contrast. Multiplanar CT image reconstructions and MIPs were obtained to evaluate the vascular anatomy. Carotid stenosis measurements (when applicable) are obtained utilizing NASCET criteria, using the distal internal carotid diameter as the denominator. CONTRAST:  20mL OMNIPAQUE IOHEXOL 350 MG/ML SOLN COMPARISON:  None available. FINDINGS: CT HEAD FINDINGS Brain: Cerebral volume within normal limits for age. No acute intracranial hemorrhage or large vessel territory infarct. No mass lesion, midline shift or mass effect. No hydrocephalus or extra-axial fluid collection. Vascular: No hyperdense vessel. Scattered vascular calcifications noted within the carotid siphons. Skull: Scalp soft tissues and calvarium within normal limits. Sinuses: Clear. Other: Unremarkable. CTA NECK FINDINGS Aortic arch: Visualized aortic arch normal caliber with normal branch pattern. Moderate atheromatous change about the arch itself. No hemodynamically significant stenosis  about the origin of the great vessels. Right carotid system: Right common and internal carotid arteries widely patent without stenosis, dissection or occlusion. Left carotid system: Left common and internal carotid arteries widely patent without stenosis, dissection or occlusion. Vertebral arteries: Both vertebral arteries arise from subclavian arteries. No proximal subclavian artery stenosis. Mild plaque at the origin the right vertebral artery with no more than mild stenosis. Vertebral arteries patent distally without stenosis, dissection or occlusion. Skeleton: No visible acute osseous finding. No discrete or worrisome osseous lesions. No significant osteoarthritic changes about the TMJs. Patient is edentulous. Other neck: Few small thyroid nodules noted, largest of which measures 11 mm on the right, of doubtful significance given size and patient age, no follow-up imaging recommended (ref: J Am Coll Radiol. 2015 Feb;12(2): 143-50).There is mild asymmetric prominence of multiple small serpiginous vessels within the fat of the lower left neck/left supraclavicular region (series 9, image 53), likely small venous collaterals. Finding of uncertain etiology or significance. No other soft tissue abnormality within the neck. Upper chest: Visualized upper chest demonstrates no acute finding. Review of the MIP images confirms the above findings CTA HEAD FINDINGS Anterior circulation: Both internal carotid arteries patent to the termini without stenosis or other abnormality. A1 segments patent bilaterally. Normal anterior communicating artery complex. Anterior cerebral arteries patent to their distal aspects without stenosis. No M1 stenosis or occlusion. Normal MCA bifurcations. Distal MCA branches perfused and symmetric. Posterior circulation: Both V4 segments patent to the vertebrobasilar junction without stenosis. Left PICA origin patent and normal. Right PICA not seen. Basilar widely patent to its distal aspect.  Superior cerebral arteries patent bilaterally. Both PCA supplied via the basilar as well as small bilateral posterior communicating arteries. PCAs patent to their distal aspects without stenosis. Venous sinuses: Grossly patent allowing for timing the contrast . Anatomic variants: None significant.  No aneurysm. Review of the MIP images confirms the above findings IMPRESSION: 1. Negative CTA of the head and neck. No evidence for dissection or other acute vascular abnormality. No large vessel occlusion. 2. Mild asymmetric prominence of multiple small serpiginous vessels within the fat of the lower left neck/left supraclavicular region, likely small venous collaterals. Finding of uncertain etiology or significance. Correlation with physical exam recommended. 3.  Aortic Atherosclerosis (ICD10-I70.0). Electronically Signed   By: Jeannine Boga M.D.   On: 12/09/2020 19:43   CT Angio Neck W and/or Wo Contrast  Result Date: 12/09/2020 CLINICAL DATA:  Initial evaluation for acute pain involving the left head/face/neck. Not the/F somewhat function read viewed at a are/ EXAM: CT ANGIOGRAPHY HEAD AND NECK TECHNIQUE: Multidetector CT imaging of the head and neck was performed using the standard protocol during bolus administration of intravenous contrast. Multiplanar CT image reconstructions and MIPs were obtained to evaluate the vascular anatomy. Carotid stenosis measurements (when applicable) are obtained utilizing NASCET criteria, using the distal internal carotid diameter as the denominator. CONTRAST:  79mL OMNIPAQUE IOHEXOL 350 MG/ML SOLN COMPARISON:  None available. FINDINGS: CT HEAD FINDINGS Brain: Cerebral volume within normal limits for age. No acute intracranial hemorrhage or large vessel territory infarct. No mass lesion, midline shift or mass effect. No hydrocephalus or extra-axial fluid collection. Vascular: No hyperdense vessel. Scattered vascular calcifications noted within the carotid siphons. Skull:  Scalp soft tissues and calvarium within normal limits. Sinuses: Clear. Other: Unremarkable. CTA NECK FINDINGS Aortic arch: Visualized aortic arch normal caliber with normal branch pattern. Moderate atheromatous change about the arch itself. No hemodynamically significant stenosis about the origin of the great vessels. Right carotid system: Right common and internal carotid arteries widely patent without stenosis, dissection or occlusion. Left carotid system: Left common and internal carotid arteries widely patent without stenosis, dissection or occlusion. Vertebral arteries: Both vertebral arteries arise from subclavian arteries. No proximal subclavian artery stenosis. Mild plaque at the origin the right vertebral artery with no more than mild stenosis. Vertebral arteries patent distally without stenosis, dissection or occlusion. Skeleton: No visible acute osseous finding. No discrete or worrisome osseous lesions. No significant osteoarthritic changes about the TMJs. Patient is edentulous. Other neck: Few small thyroid nodules noted, largest of which measures 11 mm on the right, of doubtful significance given size and patient age, no follow-up imaging recommended (ref: J Am Coll Radiol. 2015 Feb;12(2): 143-50).There is mild asymmetric prominence of multiple small serpiginous vessels within the fat of the lower left neck/left supraclavicular region (series 9, image 53), likely small venous collaterals. Finding of uncertain etiology or significance. No other soft tissue abnormality within the neck. Upper chest: Visualized upper chest demonstrates no acute finding. Review of the MIP images confirms the above findings CTA HEAD FINDINGS Anterior circulation: Both internal carotid arteries patent to the termini without stenosis or other abnormality. A1 segments patent bilaterally. Normal anterior communicating artery complex. Anterior cerebral arteries patent to their distal aspects without stenosis. No M1 stenosis or  occlusion. Normal MCA bifurcations. Distal MCA branches perfused and symmetric. Posterior circulation: Both V4 segments patent to the vertebrobasilar junction without stenosis. Left PICA origin patent and normal. Right PICA not seen. Basilar widely patent to its distal aspect. Superior cerebral arteries patent bilaterally. Both PCA supplied via the basilar as well as small bilateral posterior communicating arteries. PCAs patent to their distal aspects without stenosis. Venous sinuses: Grossly patent allowing for timing the contrast . Anatomic variants: None significant.  No aneurysm. Review of the MIP images confirms the above findings IMPRESSION: 1. Negative CTA of the head and neck. No evidence for dissection or other acute vascular abnormality. No large vessel occlusion. 2. Mild asymmetric prominence of multiple small serpiginous vessels within the fat of the lower left neck/left supraclavicular region, likely small venous collaterals. Finding of uncertain etiology or significance. Correlation with physical exam recommended. 3.  Aortic Atherosclerosis (ICD10-I70.0). Electronically Signed  By: Jeannine Boga M.D.   On: 12/09/2020 19:43    Procedures Procedures   Medications Ordered in ED Medications  iohexol (OMNIPAQUE) 350 MG/ML injection 80 mL (80 mLs Intravenous Contrast Given 12/09/20 1840)    ED Course  I have reviewed the triage vital signs and the nursing notes.  Pertinent labs & imaging results that were available during my care of the patient were reviewed by me and considered in my medical decision making (see chart for details).    MDM Rules/Calculators/A&P                           Calissa NOLIE BIGNELL is a 75 y.o. vietnamese speaking female who presents for left neck pain going into her head causing headache and left facial pain.  According to patient, 2 months ago she had an episode of similar symptoms that was brief and resolved.  She reports that 3 days ago, she started having  pain in her left neck going into her left head and left face.  It is worse when she tries to eat and drink.  She feels that her face feels swollen on the left side at times but not currently.  She denies any difficulty swallowing, breathing, and denies any trauma.  She denies any recent chiropractor or massage of the neck.  Denies any other injuries.  Denies any visual changes.  Denies any diplopia.  Denies any fevers, chills congestion, cough, nausea, vomiting, constipation, diarrhea.  She has no reported rash on her face and has no history of carotid disease or strokes.  On exam, patient does have tenderness to her left lateral neck and her left face and around her ear.  She did not have focal tenderness at the temple.  Ear exam did not show evidence of otitis media or otitis externa.  No mastoid swelling appreciated.  Pupils are symmetric and reactive normal extraocular movements.  No tenderness on her jaw anteriorly or superiorly.  She did not have trismus for me.  Oropharyngeal exam otherwise unremarkable.  Posterior neck nontender in the midline.  She did have some muscle spasm and discomfort in her low back.  Otherwise exam unremarkable.  AVN to be interpreter was used for all conversation with the patient.  Clinically most concerned that patient has either musculoskeletal pain or TMJ discomfort given the distribution of her discomfort.  Low suspicion for giant cell arteritis given her lack of tenderness in the temporal area and lack of any visual changes.  However, her pain is going from the neck into her head and face, thus we will get a CTA of the head and neck to look for a dissection or other vascular abnormality.  Given her lack of any numbness or focal neurologic deficits low suspicion for stroke.  If work-up is reassuring, anticipate discharge with instructions to take anti-inflammatory medication and muscle relaxant and PCP follow-up  Final Clinical Impression(s) / ED Diagnoses Final  diagnoses:  Tenderness of left temporomandibular joint  Neck pain on left side    Rx / DC Orders ED Discharge Orders          Ordered    cyclobenzaprine (FLEXERIL) 10 MG tablet  Daily        12/09/20 2223           Clinical Impression: 1. Tenderness of left temporomandibular joint   2. Neck pain on left side     Disposition: Discharge  Condition: Good  I have discussed the results, Dx and Tx plan with the pt(& family if present). He/she/they expressed understanding and agree(s) with the plan. Discharge instructions discussed at great length. Strict return precautions discussed and pt &/or family have verbalized understanding of the instructions. No further questions at time of discharge.    New Prescriptions   CYCLOBENZAPRINE (FLEXERIL) 10 MG TABLET    Take 1 tablet (10 mg total) by mouth daily for 10 days.    Follow Up: Janith Lima, MD [place] [address] [phone]        Cherye Gaertner, Gwenyth Allegra, MD 12/09/20 2224

## 2020-12-09 NOTE — ED Notes (Signed)
Patient transported to CT 

## 2020-12-09 NOTE — ED Triage Notes (Signed)
Interpretor used: Pt c/o left sided facial pain and swelling x 3 days. Pt also states it hurts to chew her food.

## 2020-12-09 NOTE — Discharge Instructions (Signed)
Your history and exam are concerning for temporomandibular joint inflammation and pain.  The CT imaging was overall reassuring.  We feel you are safe for discharge home.  Please take the muscle relaxant and use anti-inflammatory medication like ibuprofen to help.  Please follow-up with your primary doctor.  If any symptoms change or worsen, please return to the nearest emergency department.

## 2020-12-09 NOTE — ED Provider Notes (Signed)
Emergency Medicine Provider Triage Evaluation Note  Ariel Harris , a 75 y.o. female  was evaluated in triage.  Pt complains of left sided facial swelling and left sided facial pain with eating. Denies any dental pain. The patient wears dentures.  Review of Systems  Positive: Left sided facial pain, swelling Negative: Dental pain, fevers  Physical Exam  BP (!) 166/88 (BP Location: Left Arm)   Pulse (!) 58   Temp 97.9 F (36.6 C) (Oral)   Resp 16   SpO2 95%  Gen:   Awake, no distress   Resp:  Normal effort  MSK:   Moves extremities without difficulty  Other:  Dentures securely in place. No palpable abscess.   Medical Decision Making  Medically screening exam initiated at 11:47 AM.  Appropriate orders placed.  Hazle Ogburn was informed that the remainder of the evaluation will be completed by another provider, this initial triage assessment does not replace that evaluation, and the importance of remaining in the ED until their evaluation is complete.  Further examination with denture removal in the back.    Sherrell Puller, PA-C 12/09/20 [place], MD 12/11/20 [phone]

## 2020-12-19 ENCOUNTER — Other Ambulatory Visit: Payer: Self-pay | Admitting: Internal Medicine

## 2020-12-19 DIAGNOSIS — E785 Hyperlipidemia, unspecified: Secondary | ICD-10-CM

## 2020-12-25 ENCOUNTER — Ambulatory Visit
Admission: RE | Admit: 2020-12-25 | Discharge: 2020-12-25 | Disposition: A | Discharge status: Home / Self Care | Payer: Medicare Other | Source: Ambulatory Visit | Attending: Internal Medicine | Admitting: Internal Medicine

## 2020-12-25 DIAGNOSIS — Z1231 Encounter for screening mammogram for malignant neoplasm of breast: Secondary | ICD-10-CM | POA: Diagnosis not present

## 2021-01-04 ENCOUNTER — Other Ambulatory Visit: Payer: Self-pay | Admitting: Internal Medicine

## 2021-01-04 DIAGNOSIS — M810 Age-related osteoporosis without current pathological fracture: Secondary | ICD-10-CM

## 2021-01-10 ENCOUNTER — Other Ambulatory Visit: Payer: Self-pay | Admitting: Internal Medicine

## 2021-01-10 DIAGNOSIS — I10 Essential (primary) hypertension: Secondary | ICD-10-CM

## 2021-01-15 ENCOUNTER — Ambulatory Visit: Payer: Medicare Other | Admitting: Internal Medicine

## 2021-03-22 ENCOUNTER — Other Ambulatory Visit: Payer: Self-pay | Admitting: Internal Medicine

## 2021-03-22 DIAGNOSIS — I10 Essential (primary) hypertension: Secondary | ICD-10-CM

## 2021-03-26 ENCOUNTER — Other Ambulatory Visit: Payer: Self-pay | Admitting: Internal Medicine

## 2021-03-26 DIAGNOSIS — M810 Age-related osteoporosis without current pathological fracture: Secondary | ICD-10-CM

## 2021-04-18 ENCOUNTER — Ambulatory Visit (INDEPENDENT_AMBULATORY_CARE_PROVIDER_SITE_OTHER): Payer: Medicare Other | Admitting: Family Medicine

## 2021-04-18 ENCOUNTER — Encounter: Payer: Self-pay | Admitting: Family Medicine

## 2021-04-18 VITALS — BP 134/84 | HR 55 | Temp 98.2°F | Ht 63.0 in | Wt 165.0 lb

## 2021-04-18 DIAGNOSIS — H1031 Unspecified acute conjunctivitis, right eye: Secondary | ICD-10-CM

## 2021-04-18 DIAGNOSIS — H5789 Other specified disorders of eye and adnexa: Secondary | ICD-10-CM

## 2021-04-18 MED ORDER — POLYMYXIN B-TRIMETHOPRIM 10000-0.1 UNIT/ML-% OP SOLN: 1.0000 [drp] | Freq: Four times a day (QID) | OPHTHALMIC | 0 refills | Status: DC

## 2021-04-18 NOTE — Progress Notes (Signed)
? ?  Subjective:  ? ? Patient ID: Ariel Harris, female    DOB: 03-16-1945, 76 y.o.   MRN: [id number] ? ?Eye Pain  ? ?Chief Complaint  ?Patient presents with  ? Eye Pain  ?  Right eye pain and itchiness for over a week now. Eye is now red due to scratching. Has used eye drops.  ? ?Patient here with granddaughter.  ?Information obtained via medical interpreter on a stick.  ? ?Complains of 10 day history of right eye redness, itchy and clear drainage that is now becoming more colorful in the mornings and crusting is noted when waking up. Mild TTP due to rubbing her eye. Left eye feels normal.  ? ?No known injury or foreign body.  ? ?Denies fever, chills, headache, vision changes, eye pain, URI symptoms.  ? ? ? ? ?Review of Systems  ?Eyes:  Positive for pain.  ?Pertinent positives and negatives in the history of present illness. ? ?   ?Objective:  ? Physical Exam ?Constitutional:   ?   Appearance: Normal appearance. She is not ill-appearing.  ?Eyes:  ?   General: Lids are normal. Vision grossly intact. Gaze aligned appropriately.     ?   Right eye: No foreign body or discharge.  ?   Extraocular Movements: Extraocular movements intact.  ?   Conjunctiva/sclera:  ?   Right eye: Right conjunctiva is injected.  ?   Pupils: Pupils are equal, round, and reactive to light.  ?Musculoskeletal:  ?   Cervical back: Normal range of motion and neck supple.  ?Lymphadenopathy:  ?   Cervical: No cervical adenopathy.  ?Neurological:  ?   Mental Status: She is alert.  ? ?BP 134/84 (BP Location: Left Arm, Patient Position: Sitting, Cuff Size: Large)   Pulse (!) 55   Temp 98.2 ?F (36.8 ?C) (Oral)   Ht '5\' 3"'$  (1.6 m)   Wt 165 lb (74.8 kg)   SpO2 98%   BMI 29.23 kg/m?  ? ? ? ? ?   ?Assessment & Plan:  ?Acute conjunctivitis of right eye, unspecified acute conjunctivitis type - Plan: trimethoprim-polymyxin b (POLYTRIM) ophthalmic solution ? ?Redness of right eye ? ?No red flag symptoms.  Discussed that allergies may be playing a role and I  recommend using over-the-counter allergy drops as well as a nondrowsy antihistamine for the next 2 weeks minimum.  I will also cover her for a bacterial infection.  Polytrim prescribed.  Recommend avoiding rubbing her eye.  May use cool compresses.  She will follow-up if worsening symptoms arise or if she is not improving over the next 2 weeks. ?Time spent using medical interpreter. ? ?

## 2021-04-18 NOTE — Patient Instructions (Signed)
Continue using over the counter Claritin, Allegra or Xyzal daily for the next 2 weeks.  ?Use the eye drops as prescribed.  ? ?Follow up if your symptoms are getting worse.  ? ? ?Allergic Conjunctivitis, Adult ?Allergic conjunctivitis is inflammation of the conjunctiva. The conjunctiva is the thin, clear membrane that covers the white part of the eye and the inner surface of the eyelid. In this condition: ?The blood vessels in the conjunctiva become irritated and swell. ?The eyes become red or pink and feel itchy. ?Allergic conjunctivitis cannot be spread from person to person. This condition can develop at any age and may be outgrown. ?What are the causes? ?This condition is caused by allergens. These are things that can cause an allergic reaction in some people but not in other people. Common allergens include: ?Outdoor allergens, such as: ?Pollen, including pollen from grass and weeds. ?Mold spores. ?Car fumes. ?Indoor allergens, such as: ?Dust. ?Smoke. ?Mold spores. ?Proteins in a pet's urine, saliva, or dander. ?What increases the risk? ?You may be more likely to develop this condition if you have a family history of these things: ?Allergies. ?Conditions caused by being exposed to allergens, such as: ?Allergic rhinitis. This is an allergic reaction that affects the nose. ?Bronchial asthma. This condition affects the large airways in the lungs and makes breathing difficult. ?Atopic dermatitis (eczema). This is inflammation of the skin that is long-term (chronic). ?What are the signs or symptoms? ?Symptoms of this condition include eyes that are: ?Itchy. ?Red. ?Watery. ?Puffy. ?Your eyes may also: ?Sting or burn. ?Have clear fluid draining from them. ?Have thick mucus discharge and pain (vernal conjunctivitis). ?How is this diagnosed? ?This condition may be diagnosed by: ?Your medical history. ?A physical exam. ?Tests of the fluid draining from your eyes to rule out other causes. ?Other tests to confirm the  diagnosis, including: ?Testing for allergies. The skin may be pricked with a tiny needle. The pricked area is then exposed to small amounts of allergens. ?Testing for other eye conditions. Tests may include: ?Blood tests. ?Tissue scrapings from your eyelid. The tissue is then checked under a microscope. ?How is this treated? ?This condition may be treated with: ?Cold, wet cloths (cold compresses) to soothe itching and swelling. ?Washing the face to remove allergens. ?Eye drops. These may be prescription or over-the-counter. You may need to try different types to see which one works best for you, such as: ?Eye drops that block the allergic reaction (antihistamine). ?Eye drops that reduce swelling and irritation (anti-inflammatory). ?Steroid eye drops, which may be given if other treatments have not worked (vernal conjunctivitis). ?Oral antihistamine medicines. These are medicines taken by mouth to lessen your allergic reaction. You may need these if eye drops do not help or are difficult to use. ?Follow these instructions at home: ?Eye care ?Apply a clean, cold compress to your eyes for 10-20 minutes, 3-4 times a day. ?Do not touch or rub your eyes. ?Do not wear contact lenses until the inflammation is gone. Wear glasses instead. ?Do not wear eye makeup until the inflammation is gone. ?General instructions ?Avoid known allergens whenever possible. ?Take or apply over-the-counter and prescription medicines only as told by your health care provider. These include any eye drops. ?Drink enough fluid to keep your urine pale yellow. ?Keep all follow-up visits as told by your health care provider. This is important. ?Contact a health care provider if: ?Your symptoms get worse or do not get better with treatment. ?You have mild eye pain. ?You become  sensitive to light. ?You have spots or blisters on your eyes. ?You have pus draining from your eyes. ?You have a fever. ?Get help right away if: ?You have redness, swelling, or  other symptoms in only one eye. ?Your vision is blurred or you have other vision changes. ?You have severe eye pain. ?Summary ?Allergic conjunctivitis is inflammation of the clear membrane that covers the white part of the eye and the inner surface of the eyelid. ?Take or apply over-the-counter and prescription medicines only as told by your health care provider. These include eye drops. ?Do not touch or rub your eyes. ?Contact a health care provider if your symptoms get worse or do not get better with treatment. ?This information is not intended to replace advice given to you by your health care provider. Make sure you discuss any questions you have with your health care provider. ?Document Revised: 12/07/2018 Document Reviewed: 12/07/2018 ?Elsevier Patient Education ? [place]. ? ?

## 2021-06-13 ENCOUNTER — Emergency Department (HOSPITAL_COMMUNITY): Payer: Medicare Other

## 2021-06-13 ENCOUNTER — Emergency Department (HOSPITAL_COMMUNITY)
Admission: EM | Admit: 2021-06-13 | Discharge: 2021-06-13 | Disposition: A | Discharge status: Home / Self Care | Payer: Medicare Other | Attending: Emergency Medicine | Admitting: Emergency Medicine

## 2021-06-13 DIAGNOSIS — R0789 Other chest pain: Secondary | ICD-10-CM | POA: Insufficient documentation

## 2021-06-13 DIAGNOSIS — R0781 Pleurodynia: Secondary | ICD-10-CM | POA: Insufficient documentation

## 2021-06-13 DIAGNOSIS — W19XXXA Unspecified fall, initial encounter: Secondary | ICD-10-CM | POA: Diagnosis not present

## 2021-06-13 DIAGNOSIS — R17 Unspecified jaundice: Secondary | ICD-10-CM

## 2021-06-13 DIAGNOSIS — M25562 Pain in left knee: Secondary | ICD-10-CM | POA: Diagnosis not present

## 2021-06-13 LAB — COMPREHENSIVE METABOLIC PANEL
ALT: 16 U/L (ref 0–44)
AST: 25 U/L (ref 15–41)
Albumin: 4 g/dL (ref 3.5–5.0)
Alkaline Phosphatase: 36 U/L — ABNORMAL LOW (ref 38–126)
Anion gap: 8 (ref 5–15)
BUN: 16 mg/dL (ref 8–23)
CO2: 24 mmol/L (ref 22–32)
Calcium: 9.3 mg/dL (ref 8.9–10.3)
Chloride: 108 mmol/L (ref 98–111)
Creatinine, Ser: 0.82 mg/dL (ref 0.44–1.00)
GFR, Estimated: >60 mL/min (ref >60)
Glucose, Bld: 112 mg/dL — ABNORMAL HIGH (ref 70–99)
Potassium: 3.9 mmol/L (ref 3.5–5.1)
Sodium: 140 mmol/L (ref 135–145)
Total Bilirubin: 1.4 mg/dL — ABNORMAL HIGH (ref 0.3–1.2)
Total Protein: 8 g/dL (ref 6.5–8.1)

## 2021-06-13 LAB — CBC WITH DIFFERENTIAL/PLATELET
Abs Immature Granulocytes: 0.01 10*3/uL (ref 0.00–0.07)
Basophils Absolute: 0 10*3/uL (ref 0.0–0.1)
Basophils Relative: 0 %
Eosinophils Absolute: 0.3 10*3/uL (ref 0.0–0.5)
Eosinophils Relative: 5 %
HCT: 42.9 % (ref 36.0–46.0)
Hemoglobin: 14.5 g/dL (ref 12.0–15.0)
Immature Granulocytes: 0 %
Lymphocytes Relative: 24 %
Lymphs Abs: 1.3 10*3/uL (ref 0.7–4.0)
MCH: 30.1 pg (ref 26.0–34.0)
MCHC: 33.8 g/dL (ref 30.0–36.0)
MCV: 89.2 fL (ref 80.0–100.0)
Monocytes Absolute: 0.5 10*3/uL (ref 0.1–1.0)
Monocytes Relative: 10 %
Neutro Abs: 3.2 10*3/uL (ref 1.7–7.7)
Neutrophils Relative %: 61 %
Platelets: 163 10*3/uL (ref 150–400)
RBC: 4.81 MIL/uL (ref 3.87–5.11)
RDW: 13.1 % (ref 11.5–15.5)
WBC: 5.3 10*3/uL (ref 4.0–10.5)
nRBC: 0 % (ref 0.0–0.2)

## 2021-06-13 LAB — LIPASE, BLOOD: Lipase: 51 U/L (ref 11–51)

## 2021-06-13 LAB — TROPONIN I (HIGH SENSITIVITY)
Troponin I (High Sensitivity): 6 ng/L (ref <18)
Troponin I (High Sensitivity): 6 ng/L (ref <18)

## 2021-06-13 MED ORDER — METHOCARBAMOL 500 MG PO TABS: 500.0000 mg | ORAL_TABLET | Freq: Two times a day (BID) | ORAL | 0 refills | Status: DC

## 2021-06-13 MED ORDER — ACETAMINOPHEN 325 MG PO TABS
650.0000 mg | ORAL_TABLET | Freq: Once | ORAL | Status: AC
  Administered 2021-06-13: 650 mg via ORAL
  Filled 2021-06-13: qty 2

## 2021-06-13 MED ORDER — METHOCARBAMOL 500 MG PO TABS
500.0000 mg | ORAL_TABLET | Freq: Once | ORAL | Status: AC
  Administered 2021-06-13: 500 mg via ORAL
  Filled 2021-06-13: qty 1

## 2021-06-13 NOTE — ED Provider Triage Note (Signed)
Emergency Medicine Provider Triage Evaluation Note  Ariel Harris , a 76 y.o. female  was evaluated in triage.  Pt complains of mechanical fall.  Denies hitting head, LOC or anticoagulation.  Has had pain to anterior chest wall, upper abdomen and left knee since the fall.  Has been able to walk.  No headache, numbness, weakness.  No chest pain or abdominal pain prior to fall.  Family acts as interpretor   Review of Systems  Positive: Fall, chest pain, abdominal pain, left knee pain Negative: Headache, numbness or weakness  Physical Exam  BP (!) 183/94   Pulse 73   Temp 98.3 F (36.8 C) (Oral)   Resp 18   SpO2 98%  Gen:   Awake, no distress   Resp:  Normal effort  Chest:  Tenderness anterior chest wall without crepitus MSK:   Moves extremities without difficulty  Other:    Medical Decision Making  Medically screening exam initiated at 4:07 PM.  Appropriate orders placed.  Saniyyah Elster was informed that the remainder of the evaluation will be completed by another provider, this initial triage assessment does not replace that evaluation, and the importance of remaining in the ED until their evaluation is complete.  Fall, CP, abd pain, left knee pain   Rosette Bellavance A, PA-C 06/13/21 1608

## 2021-06-13 NOTE — Discharge Instructions (Signed)
Tylenol and Motrin as needed for pain ? ?Make sure to rest and drink plenty of fluids ? ?Labs did not show any significant abnormality ? ?No broken bones on x-rays ?

## 2021-06-13 NOTE — ED Notes (Signed)
Pt ambulatory without assistance.  

## 2021-06-13 NOTE — ED Provider Notes (Signed)
?[facility] DEPT ?Provider Note ? ? ?CSN: [id number] ?Arrival date & time: 06/13/21  1558 ? ?  ? ?History ? ?Chief Complaint  ?Patient presents with  ? Fall  ? ? ?Ariel Harris is a 76 y.o. female with mechanical fall earlier today.  Landed on her anterior chest.  Developed pain to her anterior ribs since the fall and left knee pain.  Did not have chest pain prior to her fall.  No abdominal pain currently.  No back pain.  Denies hitting head, LOC or anticoagulation.  Ambulatory PTA.  No numbness or weakness. ? ?Here with family who helps provide history  ? ?Interpretor was used. ? ?HPI ? ?  ? ?Home Medications ?Prior to Admission medications   ?Medication Sig Start Date End Date Taking? Authorizing Provider  ?methocarbamol (ROBAXIN) 500 MG tablet Take 1 tablet (500 mg total) by mouth 2 (two) times daily. 06/13/21  Yes Lucero Auzenne A, PA-C  ?alendronate (FOSAMAX) 70 MG tablet TAKE 1 TABLET BY MOUTH ONCE A WEEK. TAKE WITH A FULL GLASS OF WATER ON AN EMPTY STOMACH. 03/26/21   Janith Lima, MD  ?metoprolol succinate (TOPROL-XL) 25 MG 24 hr tablet TAKE 1 TABLET BY MOUTH DAILY, MAY TAKE 1 EXTRA TABLET BY MOUTH DAILY ONLY AS NEEDED FOR PALPITATIONS 03/22/21   Janith Lima, MD  ?pravastatin (PRAVACHOL) 40 MG tablet TAKE 1 TABLET BY MOUTH EVERY DAY 12/19/20   Janith Lima, MD  ?trimethoprim-polymyxin b (POLYTRIM) ophthalmic solution Place 1 drop into the right eye every 6 (six) hours. 04/18/21   Girtha Rm, NP-C  ?   ? ?Allergies    ?Indomethacin and Tramadol   ? ?Review of Systems   ?Review of Systems  ?Constitutional: Negative.   ?HENT: Negative.    ?Respiratory: Negative.    ?Cardiovascular:  Positive for chest pain (chest wall pain).  ?Gastrointestinal: Negative.   ?Genitourinary: Negative.  Negative for vaginal discharge.  ?Musculoskeletal:   ?     Left knee pain  ?Skin: Negative.   ?All other systems reviewed and are negative. ? ?Physical Exam ?Updated Vital Signs ?BP (!)  173/86 (BP Location: Right Arm)   Pulse 64   Temp 98.3 ?F (36.8 ?C) (Oral)   Resp 18   Ht '5\' 3"'$  (1.6 m)   Wt 70.3 kg   SpO2 96%   BMI 27.46 kg/m?  ?Physical Exam ?Vitals and nursing note reviewed.  ?Constitutional:   ?   General: She is not in acute distress. ?   Appearance: She is well-developed. She is not ill-appearing.  ?HENT:  ?   Head: Normocephalic and atraumatic.  ?   Comments: Nontender.  No raccoon eye, battle sign ?   Nose: Nose normal.  ?   Mouth/Throat:  ?   Mouth: Mucous membranes are moist.  ?Eyes:  ?   Pupils: Pupils are equal, round, and reactive to light.  ?Neck:  ?   Comments: No midline tenderness ?Cardiovascular:  ?   Rate and Rhythm: Normal rate.  ?   Pulses: Normal pulses.  ?   Heart sounds: Normal heart sounds.  ?Pulmonary:  ?   Effort: Pulmonary effort is normal. No respiratory distress.  ?   Breath sounds: Normal breath sounds.  ?   Comments: Clear Bilaterally, speaks in full sentences without difficulty ?Chest:  ?   Comments: Tenderness to anterior ribs diffusely without crepitus or step-off. No contusions, abrasions. ?Abdominal:  ?   General: Bowel sounds are normal.  There is no distension.  ?   Palpations: Abdomen is soft.  ?   Comments: Soft, nontender  ?Musculoskeletal:     ?   General: Normal range of motion.  ?   Cervical back: Normal range of motion.  ?   Comments: No midline C/T/L tenderness ?No bony tenderness to bilateral upper or lower extremities ?Mild tenderness left anterior knee.  Full range of motion.  No bony tenderness hips, pelvis, tib-fib bilaterally  ?Skin: ?   General: Skin is warm and dry.  ?   Capillary Refill: Capillary refill takes less than 2 seconds.  ?   Comments: No edema, erythema or warmth.  No lacerations.  ?Neurological:  ?   General: No focal deficit present.  ?   Mental Status: She is alert.  ?Psychiatric:     ?   Mood and Affect: Mood normal.  ? ?ED Results / Procedures / Treatments   ?Labs ?(all labs ordered are listed, but only abnormal results  are displayed) ?Labs Reviewed  ?COMPREHENSIVE METABOLIC PANEL - Abnormal; Notable for the following components:  ?    Result Value  ? Glucose, Bld 112 (*)   ? Alkaline Phosphatase 36 (*)   ? Total Bilirubin 1.4 (*)   ? All other components within normal limits  ?CBC WITH DIFFERENTIAL/PLATELET  ?LIPASE, BLOOD  ?TROPONIN I (HIGH SENSITIVITY)  ?TROPONIN I (HIGH SENSITIVITY)  ? ? ?EKG ?None ? ?Radiology ?DG Ribs Bilateral W/Chest ? ?Result Date: 06/13/2021 ?CLINICAL DATA:  Fall. EXAM: BILATERAL RIBS AND CHEST - 4+ VIEW COMPARISON:  Chest x-ray 09/27/2020 FINDINGS: No fracture or other bone lesions are seen involving the ribs. There is no evidence of pneumothorax or pleural effusion. Both lungs are clear. Heart size and mediastinal contours are within normal limits. IMPRESSION: Negative. Electronically Signed   By: Ronney Asters M.D.   On: 06/13/2021 17:59  ? ?DG Knee Complete 4 Views Left ? ?Result Date: 06/13/2021 ?CLINICAL DATA:  [place].  Left knee pain. 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 No periprosthetic fracture is identified. No definite joint effusion. IMPRESSION: No acute bony findings. Electronically Signed   By: Marijo Sanes M.D.   On: 06/13/2021 17:59   ? ?Procedures ?Procedures  ? ? ?Medications Ordered in ED ?Medications  ?methocarbamol (ROBAXIN) tablet 500 mg (has no administration in time range)  ?acetaminophen (TYLENOL) tablet 650 mg (650 mg Oral Given 06/13/21 2014)  ? ? ?ED Course/ Medical Decision Making/ A&P ?  ? ?76 year old here for evaluation mechanical fall earlier today.  Developed some chest wall pain and left knee pain after the fall.  She had no preceding chest pain.  No shortness of breath.  She is ambulatory, neurovascularly intact.  She has no evidence of acute traumatic injuries on exam.  No clinical evidence of VTE.  Symptoms do not seem consistent with pulmonary embolism.  No cough, low suspicion for  shortness of breath. ? ?Labs and imaging personally viewed and interpreted: ? ?CBC without leukocytosis ?Metabolic panel T. bili 1.4, similar to prior labs ?Lipase 51 ?Troponin 6--6 ?EKG without ischemic changes ?DG chest bilateral ribs without significant abnormality DG left knee without significant abnormality ? ?Patient reassessed.  No pain.  She is ambulatory.  Suggest chest wall pain from fall.  Low suspicion for acute ACS, PE, VTE, dissection, fractured rib, pneumothorax, acute traumatic intra-abdominal intrathoracic etiology. ? ?The patient has been appropriately medically screened and/or stabilized in the ED.  I have low suspicion for any other emergent medical condition which would require further screening, evaluation or treatment in the ED or require inpatient management. ? ?Patient is hemodynamically stable and in no acute distress.  Patient able to ambulate in department prior to ED.  Evaluation does not show acute pathology that would require ongoing or additional emergent interventions while in the emergency department or further inpatient treatment.  I have discussed the diagnosis with the patient and answered all questions.  Pain is been managed while in the emergency department and patient has no further complaints prior to discharge.  Patient is comfortable with plan discussed in room and is stable for discharge at this time.  I have discussed strict return precautions for returning to the emergency department.  Patient was encouraged to follow-up with PCP/specialist refer to at discharge.  ? ? ?                        ?Medical Decision Making ?Amount and/or Complexity of Data Reviewed ?Independent Historian:  ?   Details: family ?External Data Reviewed: labs, radiology, ECG and notes. ?Labs: ordered. Decision-making details documented in ED Course. ?Radiology: ordered and independent interpretation performed. Decision-making details documented in ED Course. ?ECG/medicine tests: ordered and  independent interpretation performed. Decision-making details documented in ED Course. ? ?Risk ?OTC drugs. ?Prescription drug management. ?Decision regarding hospitalization. ?Diagnosis or treatment significantly limited

## 2021-06-13 NOTE — ED Triage Notes (Signed)
Pt states she was walking about 4 hours ago and tripped and fell. Pt states she now has pain in her chest area, upper abdomen, left  knee, and right hand. ?

## 2021-06-13 NOTE — ED Notes (Signed)
I provided reinforced discharge education based off of discharge instructions. Pt acknowledged and understood my education. Pt had no further questions/concerns for provider/myself.  °

## 2021-06-14 ENCOUNTER — Ambulatory Visit: Payer: Medicare Other | Admitting: Family Medicine

## 2021-06-15 ENCOUNTER — Other Ambulatory Visit: Payer: Self-pay | Admitting: Internal Medicine

## 2021-06-15 DIAGNOSIS — M810 Age-related osteoporosis without current pathological fracture: Secondary | ICD-10-CM

## 2021-06-21 ENCOUNTER — Other Ambulatory Visit: Payer: Self-pay | Admitting: Internal Medicine

## 2021-06-21 DIAGNOSIS — I10 Essential (primary) hypertension: Secondary | ICD-10-CM

## 2021-06-29 ENCOUNTER — Other Ambulatory Visit: Payer: Self-pay | Admitting: Internal Medicine

## 2021-06-29 DIAGNOSIS — E785 Hyperlipidemia, unspecified: Secondary | ICD-10-CM

## 2021-08-24 ENCOUNTER — Telehealth: Payer: Self-pay | Admitting: Internal Medicine

## 2021-08-24 NOTE — Telephone Encounter (Signed)
Caller & Relationship to patient: Ariel Harris- Granddaughter  Call back number: [phone]  Date of last office visit: 10/20/20  Date of next office visit: 08/28/21  Medication(s) to be refilled: alendronate (FOSAMAX) 70 MG tablet  metoprolol succinate (TOPROL-XL) 25 MG 24 hr tablet  pravastatin (PRAVACHOL) 40 MG tablet   Preferred Pharmacy:  [id number] [place] [place]:  [phone] Fax:  [phone]

## 2021-08-28 ENCOUNTER — Ambulatory Visit (INDEPENDENT_AMBULATORY_CARE_PROVIDER_SITE_OTHER): Payer: Medicare Other | Admitting: Internal Medicine

## 2021-08-28 ENCOUNTER — Encounter: Payer: Self-pay | Admitting: Internal Medicine

## 2021-08-28 VITALS — BP 136/86 | HR 60 | Temp 98.1°F | Resp 16 | Ht 63.0 in | Wt 164.0 lb

## 2021-08-28 DIAGNOSIS — Z23 Encounter for immunization: Secondary | ICD-10-CM | POA: Diagnosis not present

## 2021-08-28 DIAGNOSIS — I10 Essential (primary) hypertension: Secondary | ICD-10-CM | POA: Diagnosis not present

## 2021-08-28 DIAGNOSIS — R7303 Prediabetes: Secondary | ICD-10-CM

## 2021-08-28 DIAGNOSIS — E785 Hyperlipidemia, unspecified: Secondary | ICD-10-CM

## 2021-08-28 DIAGNOSIS — Z1211 Encounter for screening for malignant neoplasm of colon: Secondary | ICD-10-CM | POA: Insufficient documentation

## 2021-08-28 MED ORDER — BOOSTRIX 5-2.5-18.5 LF-MCG/0.5 IM SUSP: 0.5000 mL | Freq: Once | INTRAMUSCULAR | 0 refills | Status: AC

## 2021-08-28 MED ORDER — SHINGRIX 50 MCG/0.5ML IM SUSR: 0.5000 mL | Freq: Once | INTRAMUSCULAR | 1 refills | Status: AC

## 2021-08-28 NOTE — Patient Instructions (Signed)
T?ng huy?t p, Ng??i l?n Hypertension, Adult Huy?t p cao (t?ng huy?t p) l khi l?c b?m mu qua ??ng m?ch qu m?nh. ??ng m?ch l cc m?ch mu mang mu t? tim ?i kh?p c? th?. T?ng huy?t p khi?n tim lm vi?c v?t v? h?n ?? b?m mu v c th? khi?n cc ??ng m?ch tr? ln h?p Bergemann?c c?ng. T?ng huy?t p khng ???c ?i?u tr? Olheiser?c ki?m sot c th? d?n t?i nh?i mu c? tim, suy tim, ??t qu?, b?nh th?n v nh?ng v?n ?? khc. Ch? s? ?o huy?t p g?m m?t ch? s? cao trn m?t ch? s? th?p. L t??ng l huy?t a?p cu?a qu v? c?n ph?i d??i 120/80. Ch? s? ??u tin ("??nh") ???c g?i l huy?t p tm thu. ?y l s? ?o p su?t trong ??ng m?ch khi tim qu v? ??p. Ch? s? th? hai ("?y") ???c g?i l huy?t p tm tr??ng. ?y l s? ?o p su?t trong ??ng m?ch khi tim qu v? ngh?Lourdes Sledge nhn g gy ra? Khng r nguyn nhn chnh xc gy ra tnh tr?ng ny. C m?t s? tnh tr?ng d?n ??n huy?t p cao. ?i?u g lm t?ng nguy c?? M?t s? y?u t? nh?t ??nh c th? khi?n cho qu v? d? b? cao huy?t p h?n. M?t s? trong nh?ng y?u t? nguy c? ny n?m trong t?m ki?m sot c?a qu v?, bao g?m: Ht thu?c. Khng t?p th? d?c Tierno?c cc Garciagarcia?t ??ng th? ch?t ??y ??Marland Kitchen Th?a cn. ?n qu nhi?u ch?t bo, ???ng, ca-lo, Cot[place]?c mu?i (Natri). U?ng qu nhi?u r??u. Cc y?u t? nguy c? khc bao g?m: C ti?n s? c nhn b? b?nh tim, ti?u ???ng, cholesterol cao [place]lowers?c b?nh th?n. C?ng th?ng. C ti?n s? gia ?nh b? cao huy?t p v cholesterol cao. Ng?ng th? khi ng? do t?c ngh?n. ?? tu?i. Nguy c? t?ng ln theo ?? tu?i. C cc d?u hi?u Lacson?c tri?u ch?ng g? Huy?t p cao c th? khng gy ra cc tri?u ch?ng. Huy?t p r?t cao (c?n t?ng huy?t p) c th? gy ra: ?au ??u. Nh?p tim nhanh Wahid?c khng ??u (?nh tr?ng ng?c). Kh th?. Ch?y mu cam. Bu?n nn v nn. Th? l?c thay ??i. ?au ng?c d? d?i, chng m?t v co gi?t. Ch?n ?on tnh tr?ng ny nh? th? no? Tnh tr?ng ny ???c ch?n ?on b?ng cch ?o huy?t p c?a qu v? lc qu v? ng?i, ?? tay trn m?t b? m?t ph?ng, hai chn khng b?t  cho v hai bn chn b?ng ph?ng trn sn. B?ng qu?n thi?t b? ?o huy?t p s? ???c qu?n tr?c ti?p vo vng da cnh tay pha trn c?a qu v? ngang v?i m?c tim. Huy?t p c?n ???c ?o t nh?t hai l?n trn cng m?t cnh tay. M?t s? tnh tr?ng nh?t ??nh c th? lm cho huy?t p khc nhau gi?a tay ph?i v tay tri c?a qu v?. N?u qu v? c ch? s? huy?t p cao trong m?t l?n khm [place]?c qu v? c huy?t p bnh th??ng c km cc y?u t? nguy c? khc, qu v? c th? ???c yu c?u: Tr? l?i vo m?t ngy khc ?? ki?m tra l?i huy?t p. Theo di huy?t p t?i nh trong vng 1 tu?n [place]ozuch?c lu h?n. N?u qu v? ???c ch?n ?on b? t?ng huy?t p, qu v? c th? c?n th?c hi?n cc xt nghi?m mu Geoghegan?c ki?m tra hnh ?nh khc ?? gip chuyn gia ch?m [place] s?c kh?e hi?u nguy c? t?ng th? m?c cc b?nh  tr?ng khc. Tnh tr?ng ny ???c ?i?u tr? nh? th? no? Tnh tr?ng ny ???c ?i?u tr? b?ng cch thay ??i l?i s?ng lnh m?nh, ch?ng h?nh nh? ?n th?c ph?m c l?i cho s?c kh?e, t?p th? d?c nhi?u h?n v gi?m l??ng r??u u?ng vo. Qu v? c th? ???c gi?i thi?u ?? ???c t? v?n v? ch? ?? ?n lnh m?nh v [place]?t ??ng th? ch?t. Chuyn gia ch?m [place] s?c kh?e c th? k ??n thu?c n?u thay ??i l?i s?ng khng ?? ?? ??a huy?t p v? m?c c th? ki?m sot ???c v n?u: Huy?t p tm thu c?a qu v? trn 130. Huy?t p tm tr??ng c?a qu v? trn 80. Huy?t p m?c tiu c nhn c?a qu v? c th? khc nhau ty thu?c v tnh tr?ng b?nh l, tu?i v cc y?u t? khc. Tun th? nh?ng h??ng d?n ny ? nh: ?n v u?ng  ?n ch? ?? giu ch?t x? v kali v t natri, ???ng ph? gia v ch?t bo. M?t v d? v? k? Gandolfo?ch ?n ki?u ny ???c g?i l ch? ?? ?n DASH. DASH l vi?t t?t c?a Dietary Approaches to Stop Hypertension (Ph??ng php ti?p c?n ch? ?? ?n u?ng ?? lm gi?m huy?t p). ?n theo cch ny: ?n nhi?u tri cy v rau t??i. Vo m?i b?a ?n, c? g?ng dnh m?t n?a ??a cho tri cy v rau c?. ?n ng? c?c nguyn cm, ch?ng h?n nh? m ?ng lm t? b?t m nguyn cm, g?o l?t, Labrecque?c bnh m t? b?t m nguyn cm.  Cho ngu? c?c nguyn ca?m va?o kho?ng m?t ph?n t? ??a. ?n Roger?c hu?ng cc s?n ph?m t? s?a t bo, ch?ng h?n nh? s?a ? b? kem Sobolewski?c s?a chua t bo. Trnh nh?ng mi?ng th?t nhi?u m?, th?t ? qua ch? bi?n [place]?c th?t ??p mu?i v th?t gia c?m c da. Dnh kho?ng m?t ph?n t? ??a c?a qu v? cho cc protein khng m?, ch?ng h?n nh? c, th?t g khng da, ??u, tr?ng, Debroux?c ??u ph?. Trnh nh?ng th?c ph?m ch? bi?n [place]?c lm s?n. Nh?ng th?c ph?m ny th??ng c nhi?u natri, b? sung ???ng v ch?t bo h?n. Gi?m l??ng dng natri hng ngy c?a qu v?. Nhi?u ng??i b? t?ng huy?t p c?n ?n d??i 1.500 mg natri m?i ngy. Khng u?ng r??u n?u: Chuyn gia ch?m [place] s?c kh?e khuyn qu v? khng u?ng r??u. Qu v? c [place], c th? c [place], [place]?c ?ang c k? Gieske?ch c [place]. N?u qu v? u?ng r??u: Gi?i h?n l??ng r??u qu v? u?ng ? m?c: 0-1 ly/ngy ??i v?i n? gi?i. 0-2 ly/ngy ??i v?i nam gi?i. Bi?t m?t ly c bao nhiu r??u. ? M?, m?t ly t??ng ???ng v?i m?t chai bia 12 ao x? (355 mL), m?t ly r??u vang 5 ao x? (148 mL), Mallicoat?c m?t ly r??u m?nh 1 ao x? (44 mL). L?i s?ng  H?p tc v?i chuyn gia ch?m [place] s?c kh?e c?a qu v? ?? duy tr tr?ng l??ng c? th? c l?i cho s?c kh?e [place]oreto?c ?? gi?m cn. Hy h?i xem tr?ng l??ng no l l t??ng cho qu v?. Dnh t nh?t 30 pht t?p th? d?c c th? khi?n tim qu v? ??p nhanh h?n (t?p th? d?c nh?p ?i?u) h?u h?t cc ngy trong tu?n. Cc Shader?t ??ng c th? bao g?m ?i b?, b?i, Skaff?c ??p xe. Bao g?m bi t?p t?ng c??ng c? (bi t?p khng l?c), ch?ng h?n nh? bi t?p [place]?c nng t?, nh? m?t ph?n c?a  thi quen luy?n t?p hng tu?n c?a qu v?. C? g?ng t?p nh?ng lo?i bi t?p ny trong vng 30 pht, t nh?t l 3 ngy m?i tu?n. Khng s? d?ng b?t k? s?n ph?m no c nicotine [place]?c thu?c l. Nh?ng s?n ph?m ny bao g?m thu?c l d?ng ht, thu?c l d?ng nhai v d?ng c? ht thu?c, ch?ng h?n nh? thu?c l ?i?n t?. N?u qu v? c?n gip ?? ?? cai thu?c, hy h?i chuyn gia ch?m [place] s?c kh?e. Theo di huy?t p c?a qu v? t?i nh theo h??ng  d?n c?a chuyn gia ch?m [place] s?c kh?e. Tun th? theo t?t c? cc l?n khm l?i. ?i?u ny c vai tr quan tr?ng. Thu?c Ch? s? d?ng thu?c khng k ??n v thu?c k ??n theo ch? d?n c?a chuyn gia ch?m [place] s?c kh?e. Lm theo ch? d?n m?t cch c?n th?n. Thu?c ?i?u tr? huy?t p ph?i ???c dng theo ??n ? k. Khng b? cc li?u thu?c huy?t p. Vi?c b? dng thu?c khi?n qu v? c nguy c? g?p ph?i cc v?n ?? v c th? lm cho thu?c gi?m hi?u qu?Marland Kitchen Hy h?i chuyn gia ch?m [place] s?c kh?e c?a qu v? v? nh?ng tc d?ng ph? Ventresca?c ph?n ?ng v?i thu?c m qu v? ph?i theo di. Hy lin l?c v?i chuyn gia ch?m [place] s?c kh?e n?u qu v?: Ngh? qu v? c ph?n ?ng v?i thu?c ?ang dng. B? ?au ??u ti?p t?c tr? l?i (ti pht). C?m th?y chng m?t. B? s?ng ph ? c? chn. C v?n ?? v? th? l?c. Yu c?u tr? gip ngay l?p t?c n?u qu v?: B? ?au ??u r?t nhi?u [place]?c l l?n. B? y?u [place]?c t b b?t th??ng. C?m th?y ng?t x?u. B? ?au r?t nhi?u ? ng?c [place]?c b?ng. Nn nhi?u l?n. B? kh th?. Nh?ng tri?u ch?ng ny c th? l tr??ng h?p c?p c?u. Yu c?u tr? gip ngay l?p t?c. Hy g?i 911. Khng ch? xem tri?u ch?ng c h?t khng. Khng t? li xe ??n b?nh vi?n. Tm t?t T?ng huy?t p l khi l?c b?m mu qua cc ??ng m?ch c?a qu v? qu m?nh. N?u tnh tr?ng ny khng ???c ki?m sot, n c th? khi?n qu v? c nguy c? b? cc bi?n ch?ng nghim tr?ng. Huy?t p m?c tiu c nhn c?a qu v? c th? khc nhau ty thu?c v tnh tr?ng b?nh l, tu?i v cc y?u t? khc. ??i v?i h?u h?t m?i ng??i, huy?t p bnh th??ng l d??i 120/80. ?i?u tr? t?ng huy?t p b?ng cch thay ??i l?i s?ng, dng thu?c, Decarli?c k?t h?p c? hai. Thay ??i l?i s?ng bao g?m gi?m cn, ?n ch? ?? ?n c l?i cho s?c kh?e, t natri, t?p th? d?c nhi?u h?n v h?n ch? u?ng r??u. Thng tin ny khng nh?m m?c ?ch thay th? cho l?i khuyn m chuyn gia ch?m [place] s?c kh?e ni v?i qu v?. Hy b?o ??m qu v? ph?i th?o lu?n b?t k? v?n ?? g m qu v? c v?i chuyn gia ch?m [place] s?c kh?e c?a qu v?. Document Revised:  12/14/2020 Document Reviewed: 12/14/2020 Elsevier Patient Education  [place].

## 2021-08-28 NOTE — Progress Notes (Signed)
Subjective:  Patient ID: Ariel Harris, female    DOB: [birth year]  Age: 76 y.o. MRN: [id number]  CC: Hypertension and Hyperlipidemia   HPI Ariel Harris presents for f/up -  She feels well. Offers no complaints.  Outpatient Medications Prior to Visit  Medication Sig Dispense Refill   alendronate (FOSAMAX) 70 MG tablet TAKE 1 TABLET BY MOUTH ONCE A WEEK. TAKE WITH A FULL GLASS OF WATER ON AN EMPTY STOMACH. 12 tablet 0   methocarbamol (ROBAXIN) 500 MG tablet Take 1 tablet (500 mg total) by mouth 2 (two) times daily. 20 tablet 0   metoprolol succinate (TOPROL-XL) 25 MG 24 hr tablet TAKE 1 TABLET BY MOUTH DAILY, MAY TAKE 1 EXTRA TABLET BY MOUTH DAILY ONLY AS NEEDED FOR PALPITATIONS 150 tablet 0   pravastatin (PRAVACHOL) 40 MG tablet TAKE 1 TABLET BY MOUTH EVERY DAY 90 tablet 1   trimethoprim-polymyxin b (POLYTRIM) ophthalmic solution Place 1 drop into the right eye every 6 (six) hours. 10 mL 0   No facility-administered medications prior to visit.    ROS Review of Systems  Constitutional: Negative.  Negative for diaphoresis and fatigue.  HENT: Negative.    Eyes: Negative.   Respiratory:  Negative for cough, chest tightness and shortness of breath.   Cardiovascular:  Negative for chest pain, palpitations and leg swelling.  Gastrointestinal:  Negative for abdominal pain, constipation, diarrhea, nausea and vomiting.  Endocrine: Negative.   Genitourinary: Negative.   Musculoskeletal:  Negative for arthralgias and myalgias.  Skin: Negative.   Neurological:  Negative for dizziness and weakness.  Hematological:  Negative for adenopathy. Does not bruise/bleed easily.  Psychiatric/Behavioral: Negative.      Objective:  BP 136/86 (BP Location: Left Arm, Patient Position: Sitting, Cuff Size: Large)   Pulse 60   Temp 98.1 F (36.7 C) (Oral)   Resp 16   Ht '5\' 3"'$  (1.6 m)   Wt 164 lb (74.4 kg)   SpO2 95%   BMI 29.05 kg/m   BP Readings from Last 3 Encounters:  08/28/21 136/86   06/13/21 (!) 180/111  04/18/21 134/84    Wt Readings from Last 3 Encounters:  08/28/21 164 lb (74.4 kg)  06/13/21 155 lb (70.3 kg)  04/18/21 165 lb (74.8 kg)    Physical Exam Vitals reviewed.  HENT:     Nose: Nose normal.     Mouth/Throat:     Mouth: Mucous membranes are moist.  Eyes:     General: No scleral icterus.    Conjunctiva/sclera: Conjunctivae normal.  Cardiovascular:     Rate and Rhythm: Normal rate and regular rhythm.     Heart sounds: No murmur heard. Pulmonary:     Effort: Pulmonary effort is normal.     Breath sounds: No stridor. No wheezing, rhonchi or rales.  Abdominal:     Palpations: There is no mass.     Tenderness: There is no abdominal tenderness. There is no guarding.     Hernia: No hernia is present.  Musculoskeletal:        General: Normal range of motion.     Cervical back: Neck supple.     Right lower leg: No edema.     Left lower leg: No edema.  Lymphadenopathy:     Cervical: No cervical adenopathy.  Skin:    General: Skin is warm and dry.  Neurological:     General: No focal deficit present.     Mental Status: She is alert.  Lab Results  Component Value Date   WBC 5.3 06/13/2021   HGB 14.5 06/13/2021   HCT 42.9 06/13/2021   PLT 163 06/13/2021   GLUCOSE 112 (H) 06/13/2021   CHOL 187 05/02/2020   TRIG 77.0 05/02/2020   HDL 67.10 05/02/2020   LDLCALC 105 (H) 05/02/2020   ALT 16 06/13/2021   AST 25 06/13/2021   NA 140 06/13/2021   K 3.9 06/13/2021   CL 108 06/13/2021   CREATININE 0.82 06/13/2021   BUN 16 06/13/2021   CO2 24 06/13/2021   TSH 0.80 09/27/2020   INR 1.1 11/11/2018   HGBA1C 6.1 05/02/2020    DG Knee Complete 4 Views Left  Result Date: 06/13/2021 CLINICAL DATA:  [place].  Left knee pain. 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 No periprosthetic fracture is identified. No definite joint effusion. IMPRESSION: No acute bony findings.  Electronically Signed   By: Marijo Sanes M.D.   On: 06/13/2021 17:59   DG Ribs Bilateral W/Chest  Result Date: 06/13/2021 CLINICAL DATA:  [place]. EXAM: BILATERAL RIBS AND CHEST - 4+ VIEW COMPARISON:  Chest x-ray 09/27/2020 FINDINGS: No fracture or other bone lesions are seen involving the ribs. There is no evidence of pneumothorax or pleural effusion. Both lungs are clear. Heart size and mediastinal contours are within normal limits. IMPRESSION: Negative. Electronically Signed   By: Ronney Asters M.D.   On: 06/13/2021 17:59    Assessment & Plan:   Ariel Harris was seen today for hypertension and hyperlipidemia.  Diagnoses and all orders for this visit:  Primary hypertension- Her blood pressure is well controlled.  Hyperlipidemia LDL goal <130- I will recheck her lipid panel to see if she has achieved her LDL goal. -     Lipid panel; Future  Prediabetes- I will monitor her A1c. -     Hemoglobin A1c; Future  Screen for colon cancer -     Ambulatory referral to Gastroenterology  Need for prophylactic vaccination with combined diphtheria-tetanus-pertussis (DTP) vaccine -     Tdap (BOOSTRIX) 5-2.5-18.5 LF-MCG/0.5 injection; Inject 0.5 mLs into the muscle once for 1 dose.  Vaccine for VZV (varicella-zoster virus) -     Zoster Vaccine Adjuvanted [facility]) injection; Inject 0.5 mLs into the muscle once for 1 dose.  Need for vaccination -     Pneumococcal conjugate vaccine 20-valent   I have discontinued Ariel Harris's trimethoprim-polymyxin b. I am also having her start on Boostrix and Shingrix. Additionally, I am having her maintain her methocarbamol, alendronate, metoprolol succinate, and pravastatin.  Meds ordered this encounter  Medications   Tdap (BOOSTRIX) 5-2.5-18.5 LF-MCG/0.5 injection    Sig: Inject 0.5 mLs into the muscle once for 1 dose.    Dispense:  0.5 mL    Refill:  0   Zoster Vaccine Adjuvanted [facility]) injection    Sig: Inject 0.5 mLs into the muscle once for 1 dose.     Dispense:  0.5 mL    Refill:  1     Follow-up: Return in about 6 months (around 02/28/2022).  Ariel Calico, MD

## 2021-08-31 ENCOUNTER — Other Ambulatory Visit: Payer: Self-pay | Admitting: Internal Medicine

## 2021-08-31 ENCOUNTER — Other Ambulatory Visit: Payer: Self-pay | Admitting: Pediatrics

## 2021-08-31 DIAGNOSIS — Z1231 Encounter for screening mammogram for malignant neoplasm of breast: Secondary | ICD-10-CM

## 2021-09-03 ENCOUNTER — Other Ambulatory Visit: Payer: Self-pay | Admitting: Internal Medicine

## 2021-09-03 DIAGNOSIS — M810 Age-related osteoporosis without current pathological fracture: Secondary | ICD-10-CM

## 2021-09-16 ENCOUNTER — Other Ambulatory Visit: Payer: Self-pay | Admitting: Internal Medicine

## 2021-09-16 DIAGNOSIS — I10 Essential (primary) hypertension: Secondary | ICD-10-CM

## 2021-09-26 ENCOUNTER — Other Ambulatory Visit: Payer: Self-pay | Admitting: Internal Medicine

## 2021-09-26 DIAGNOSIS — M810 Age-related osteoporosis without current pathological fracture: Secondary | ICD-10-CM

## 2021-11-30 ENCOUNTER — Other Ambulatory Visit: Payer: Self-pay | Admitting: Internal Medicine

## 2021-11-30 DIAGNOSIS — I10 Essential (primary) hypertension: Secondary | ICD-10-CM

## 2021-12-21 ENCOUNTER — Other Ambulatory Visit: Payer: Self-pay | Admitting: Internal Medicine

## 2021-12-21 DIAGNOSIS — E785 Hyperlipidemia, unspecified: Secondary | ICD-10-CM

## 2021-12-22 ENCOUNTER — Other Ambulatory Visit: Payer: Self-pay | Admitting: Internal Medicine

## 2021-12-22 DIAGNOSIS — M810 Age-related osteoporosis without current pathological fracture: Secondary | ICD-10-CM

## 2021-12-31 ENCOUNTER — Ambulatory Visit
Admission: RE | Admit: 2021-12-31 | Discharge: 2021-12-31 | Disposition: A | Discharge status: Home / Self Care | Payer: Medicare Other | Source: Ambulatory Visit | Attending: Internal Medicine | Admitting: Internal Medicine

## 2021-12-31 DIAGNOSIS — Z1231 Encounter for screening mammogram for malignant neoplasm of breast: Secondary | ICD-10-CM

## 2022-02-26 ENCOUNTER — Other Ambulatory Visit: Payer: Self-pay | Admitting: Internal Medicine

## 2022-02-26 DIAGNOSIS — I10 Essential (primary) hypertension: Secondary | ICD-10-CM

## 2022-03-04 ENCOUNTER — Ambulatory Visit: Payer: Medicare Other | Admitting: Internal Medicine

## 2022-03-12 ENCOUNTER — Other Ambulatory Visit: Payer: Self-pay | Admitting: Internal Medicine

## 2022-03-12 DIAGNOSIS — M810 Age-related osteoporosis without current pathological fracture: Secondary | ICD-10-CM

## 2022-03-20 ENCOUNTER — Telehealth: Payer: Self-pay

## 2022-03-20 NOTE — Telephone Encounter (Signed)
Called patient to schedule Medicare Annual Wellness Visit (AWV). No voicemail available to leave a message.  Last date of AWV: 07/09/19  Please schedule an appointment at any time with [facility].  If any questions, please contact me.  Thank you ,  Norton Blizzard, [place] ([place])  [place] [facility] [phone]

## 2022-05-01 NOTE — Progress Notes (Unsigned)
Office Visit Note   Patient: Ariel Harris           Date of Birth: October 28, 1945           MRN: [id number] Visit Date: 05/02/2022              Requested by: Loni Beckwith, MD [name],  [place] [id number] PCP: Loni Beckwith, MD   Assessment & Plan: Visit Diagnoses:  1. Status post total right knee replacement   2. Status post total left knee replacement   3. Pain in right ankle and joints of right foot     Plan: Impression is 77 year old female with right knee and ankle symptoms.  The symptoms are very vague and I think it is related to just exercise intolerance.  I do not see anything focal or suspect anything structurally wrong.  I recommend resuming her PT exercises.  Over-the-counter medications as needed.  Follow-up as needed.  Language barrier increased complexity of visit.  Follow-Up Instructions: No follow-ups on file.   Orders:  Orders Placed This Encounter  Procedures   XR Knee 1-2 Views Right   XR Ankle Complete Right   No orders of the defined types were placed in this encounter.     Procedures: No procedures performed   Clinical Data: No additional findings.   Subjective: Chief Complaint  Patient presents with   Left Knee - Pain    HPI  Patient is a 77 year old [place] female comes in for valuation of 2 separate problems.  First problem is related to the right knee.  She states that she has no pain.  She is over 2 years status post right total knee replacement.  She feels like her knees get tired easily.  Denies any injuries or trauma.  Second problem is she started noticing swelling to the right ankle about 2 weeks ago.  Denies any injuries or trauma.  Denies any pain.  Review of Systems  Constitutional: Negative.   HENT: Negative.    Eyes: Negative.   Respiratory: Negative.    Cardiovascular: Negative.   Endocrine: Negative.   Musculoskeletal: Negative.   Neurological: Negative.   Hematological: Negative.    Psychiatric/Behavioral: Negative.    All other systems reviewed and are negative.    Objective: Vital Signs: There were no vitals taken for this visit.  Physical Exam Vitals and nursing note reviewed.  Constitutional:      Appearance: She is well-developed.  HENT:     Head: Normocephalic and atraumatic.  Pulmonary:     Effort: Pulmonary effort is normal.  Abdominal:     Palpations: Abdomen is soft.  Musculoskeletal:     Cervical back: Neck supple.  Skin:    General: Skin is warm.     Capillary Refill: Capillary refill takes less than 2 seconds.  Neurological:     Mental Status: She is alert and oriented to person, place, and time.  Psychiatric:        Behavior: Behavior normal.        Thought Content: Thought content normal.        Judgment: Judgment normal.     Ortho Exam  Examination of right knee shows fully healed surgical scar.  Painless range of motion.  Good varus valgus stability.  No abnormal features.  No joint effusion.  Examination right ankle shows no significant swelling.  She has good range of motion and strength.  Specialty Comments:  No specialty comments available.  Imaging: XR  Ankle Complete Right  Result Date: 05/02/2022 Negative for acute findings. Mild osteoarthritis.  XR Knee 1-2 Views Right  Result Date: 05/02/2022 Stable right total knee replacement in good alignment     PMFS History: Patient Active Problem List   Diagnosis Date Noted   Hyperlipidemia LDL goal <130 08/28/2021   Prediabetes 08/28/2021   Screen for colon cancer 08/28/2021   Need for prophylactic vaccination with combined diphtheria-tetanus-pertussis (DTP) vaccine 08/28/2021   Vaccine for VZV (varicella-zoster virus) 08/28/2021   Paresthesia 03/22/2019   Status post total left knee replacement 11/16/2018   SVT (supraventricular tachycardia) 07/09/2018   Osteoporosis 05/25/2017   Hypertension 01/12/2010   Past Medical History:  Diagnosis Date   Arthritis     "knees" (05/16/2017)   Chest pain    Hyperlipidemia    Hypertension    PAC (premature atrial contraction)    SVT (supraventricular tachycardia) (HCC)     Family History  Problem Relation Age of Onset   Coronary artery disease Neg Hx    Heart attack Neg Hx    Breast cancer Neg Hx     Past Surgical History:  Procedure Laterality Date   JOINT REPLACEMENT     KNEE ARTHROSCOPY Left 1996   KNEE SURGERY Right [phone]   "open surgery; because of the pain"   TOTAL KNEE ARTHROPLASTY Right 05/16/2017   TOTAL KNEE ARTHROPLASTY Right 05/16/2017   Procedure: RIGHT TOTAL KNEE ARTHROPLASTY;  Surgeon: Leandrew Koyanagi, MD;  Location: [place];  Service: Orthopedics;  Laterality: Right;   TOTAL KNEE ARTHROPLASTY Left 11/16/2018   Procedure: LEFT TOTAL KNEE ARTHROPLASTY;  Surgeon: Leandrew Koyanagi, MD;  Location: [place];  Service: Orthopedics;  Laterality: Left;   Social History   Occupational History   Not on file  Tobacco Use   Smoking status: Never   Smokeless tobacco: Never  Vaping Use   Vaping Use: Never used  Substance and Sexual Activity   Alcohol use: No   Drug use: No   Sexual activity: Not Currently

## 2022-05-02 ENCOUNTER — Ambulatory Visit (INDEPENDENT_AMBULATORY_CARE_PROVIDER_SITE_OTHER): Payer: 59 | Admitting: Orthopaedic Surgery

## 2022-05-02 ENCOUNTER — Other Ambulatory Visit (INDEPENDENT_AMBULATORY_CARE_PROVIDER_SITE_OTHER): Payer: 59

## 2022-05-02 DIAGNOSIS — M25571 Pain in right ankle and joints of right foot: Secondary | ICD-10-CM

## 2022-05-02 DIAGNOSIS — Z96652 Presence of left artificial knee joint: Secondary | ICD-10-CM

## 2022-05-02 DIAGNOSIS — Z96651 Presence of right artificial knee joint: Secondary | ICD-10-CM

## 2022-06-03 ENCOUNTER — Ambulatory Visit: Payer: 59 | Attending: Physical Therapy | Admitting: Physical Therapy

## 2022-11-27 ENCOUNTER — Other Ambulatory Visit: Payer: Self-pay | Admitting: Internal Medicine

## 2022-11-27 DIAGNOSIS — Z1231 Encounter for screening mammogram for malignant neoplasm of breast: Secondary | ICD-10-CM

## 2023-01-02 ENCOUNTER — Ambulatory Visit
Admission: RE | Admit: 2023-01-02 | Discharge: 2023-01-02 | Disposition: A | Discharge status: Home / Self Care | Payer: 59 | Source: Ambulatory Visit | Attending: Internal Medicine | Admitting: Internal Medicine

## 2023-01-02 DIAGNOSIS — Z1231 Encounter for screening mammogram for malignant neoplasm of breast: Secondary | ICD-10-CM

## 2023-05-24 ENCOUNTER — Other Ambulatory Visit: Payer: Self-pay

## 2023-05-24 ENCOUNTER — Emergency Department (HOSPITAL_BASED_OUTPATIENT_CLINIC_OR_DEPARTMENT_OTHER)

## 2023-05-24 ENCOUNTER — Observation Stay (HOSPITAL_BASED_OUTPATIENT_CLINIC_OR_DEPARTMENT_OTHER)
Admission: EM | Admit: 2023-05-24 | Discharge: 2023-05-26 | Disposition: A | Discharge status: Home / Self Care | Attending: Student | Admitting: Student

## 2023-05-24 ENCOUNTER — Encounter (HOSPITAL_BASED_OUTPATIENT_CLINIC_OR_DEPARTMENT_OTHER): Payer: Self-pay

## 2023-05-24 DIAGNOSIS — R072 Precordial pain: Secondary | ICD-10-CM | POA: Diagnosis present

## 2023-05-24 DIAGNOSIS — M818 Other osteoporosis without current pathological fracture: Secondary | ICD-10-CM | POA: Insufficient documentation

## 2023-05-24 DIAGNOSIS — R7303 Prediabetes: Secondary | ICD-10-CM | POA: Diagnosis present

## 2023-05-24 DIAGNOSIS — Z79899 Other long term (current) drug therapy: Secondary | ICD-10-CM | POA: Insufficient documentation

## 2023-05-24 DIAGNOSIS — I10 Essential (primary) hypertension: Secondary | ICD-10-CM | POA: Diagnosis present

## 2023-05-24 DIAGNOSIS — R7989 Other specified abnormal findings of blood chemistry: Secondary | ICD-10-CM | POA: Diagnosis present

## 2023-05-24 DIAGNOSIS — Z96653 Presence of artificial knee joint, bilateral: Secondary | ICD-10-CM | POA: Insufficient documentation

## 2023-05-24 DIAGNOSIS — E785 Hyperlipidemia, unspecified: Secondary | ICD-10-CM | POA: Diagnosis not present

## 2023-05-24 DIAGNOSIS — R079 Chest pain, unspecified: Secondary | ICD-10-CM | POA: Diagnosis present

## 2023-05-24 DIAGNOSIS — I2089 Other forms of angina pectoris: Secondary | ICD-10-CM | POA: Diagnosis present

## 2023-05-24 DIAGNOSIS — M170 Bilateral primary osteoarthritis of knee: Secondary | ICD-10-CM | POA: Insufficient documentation

## 2023-05-24 DIAGNOSIS — I2510 Atherosclerotic heart disease of native coronary artery without angina pectoris: Secondary | ICD-10-CM | POA: Diagnosis not present

## 2023-05-24 DIAGNOSIS — I471 Supraventricular tachycardia, unspecified: Secondary | ICD-10-CM | POA: Diagnosis not present

## 2023-05-24 LAB — BASIC METABOLIC PANEL WITH GFR
Anion gap: 9 (ref 5–15)
BUN: 13 mg/dL (ref 8–23)
CO2: 25 mmol/L (ref 22–32)
Calcium: 9.9 mg/dL (ref 8.9–10.3)
Chloride: 105 mmol/L (ref 98–111)
Creatinine, Ser: 0.84 mg/dL (ref 0.44–1.00)
GFR, Estimated: >60 mL/min (ref >60)
Glucose, Bld: 86 mg/dL (ref 70–99)
Potassium: 3.8 mmol/L (ref 3.5–5.1)
Sodium: 138 mmol/L (ref 135–145)

## 2023-05-24 LAB — CBC
HCT: 44.2 % (ref 36.0–46.0)
Hemoglobin: 14.5 g/dL (ref 12.0–15.0)
MCH: 29.4 pg (ref 26.0–34.0)
MCHC: 32.8 g/dL (ref 30.0–36.0)
MCV: 89.5 fL (ref 80.0–100.0)
Platelets: 197 10*3/uL (ref 150–400)
RBC: 4.94 MIL/uL (ref 3.87–5.11)
RDW: 12.9 % (ref 11.5–15.5)
WBC: 5.1 10*3/uL (ref 4.0–10.5)
nRBC: 0 % (ref 0.0–0.2)

## 2023-05-24 LAB — TROPONIN T, HIGH SENSITIVITY
Troponin T High Sensitivity: 24 ng/L — ABNORMAL HIGH (ref <19)
Troponin T High Sensitivity: 23 ng/L — ABNORMAL HIGH (ref <19)

## 2023-05-24 MED ORDER — ASPIRIN 81 MG PO CHEW
324.0000 mg | CHEWABLE_TABLET | Freq: Once | ORAL | Status: AC
  Administered 2023-05-24: 324 mg via ORAL
  Filled 2023-05-24: qty 4

## 2023-05-24 NOTE — ED Triage Notes (Signed)
 Pt presents via POV c/o left sided chest pain intermittently x1 week. Reports some SOB. Reports DOE on arrival.

## 2023-05-24 NOTE — ED Provider Notes (Signed)
 [place] EMERGENCY DEPARTMENT AT [facility] Provider Note   CSN: [id number] Arrival date & time: 05/24/23  2003     History {Add pertinent medical, surgical, social history, OB history to HPI:1} Chief Complaint  Patient presents with   Chest Pain    Ariel Harris is a 78 y.o. female.  The history is provided by the patient. The history is limited by a language barrier. A language interpreter was used Almyra Jain - shannon [phone]).  Chest Pain Pain location:  L chest Pain quality: sharp   Pain severity:  Moderate Onset quality:  Gradual Duration:  4 days Timing:  Intermittent Associated symptoms: shortness of breath   Associated symptoms: no diaphoresis, no fever, no numbness and no vomiting   Pt reports sharp left sided CP intermittently for past 4 days Worse with exertion She also reports exertional fatigue She is feeling improved at this time No recent travel   Past Medical History:  Diagnosis Date   Arthritis    "knees" (05/16/2017)   Chest pain    Hyperlipidemia    Hypertension    PAC (premature atrial contraction)    SVT (supraventricular tachycardia) (HCC)     Home Medications Prior to Admission medications   Medication Sig Start Date End Date Taking? Authorizing Provider  alendronate  (FOSAMAX ) 70 MG tablet TAKE 1 TABLET BY MOUTH ONCE A WEEK. TAKE WITH A FULL GLASS OF WATER  ON AN EMPTY STOMACH. 12/22/21   Arcadio Knuckles, MD  methocarbamol  (ROBAXIN ) 500 MG tablet Take 1 tablet (500 mg total) by mouth 2 (two) times daily. 06/13/21   Henderly, Britni A, PA-C  metoprolol  succinate (TOPROL -XL) 25 MG 24 hr tablet TAKE 1 TABLET BY MOUTH DAILY, MAY TAKE 1 EXTRA TABLET BY MOUTH DAILY ONLY AS NEEDED FOR PALPITATIONS 02/26/22   Arcadio Knuckles, MD  pravastatin  (PRAVACHOL ) 40 MG tablet TAKE 1 TABLET BY MOUTH EVERY DAY 12/21/21   Arcadio Knuckles, MD      Allergies    Indomethacin and Tramadol     Review of Systems   Review of Systems  Constitutional:   Negative for diaphoresis and fever.  Respiratory:  Positive for shortness of breath.   Cardiovascular:  Positive for chest pain.  Gastrointestinal:  Negative for vomiting.  Neurological:  Negative for syncope and numbness.    Physical Exam Updated Vital Signs BP (!) 173/87 (BP Location: Right Arm)   Pulse (!) 58   Temp 98.1 F (36.7 C) (Oral)   Resp 16   Ht 1.6 m (5\' 3" )   Wt 74 kg   SpO2 98%   BMI 28.90 kg/m  Physical Exam CONSTITUTIONAL elderly, no acute distress HEAD: Normocephalic/atraumatic EYES: EOMI/PERRL ENMT: Mucous membranes moist NECK: supple no meningeal signs CV: S1/S2 noted, no murmurs/rubs/gallops noted LUNGS: Lungs are clear to auscultation bilaterally, no apparent distress ABDOMEN: soft, nontender NEURO: Pt is awake/alert/appropriate, moves all extremitiesx4.  No facial droop.   EXTREMITIES: pulses normal/equal, full ROM, no calf tenderness or edema SKIN: warm, color normal PSYCH: no abnormalities of mood noted, alert and oriented to situation  ED Results / Procedures / Treatments   Labs (all labs ordered are listed, but only abnormal results are displayed) Labs Reviewed  TROPONIN T, HIGH SENSITIVITY - Abnormal; Notable for the following components:      Result Value   Troponin T High Sensitivity 24 (*)    All other components within normal limits  BASIC METABOLIC PANEL WITH GFR  CBC  TROPONIN T, HIGH SENSITIVITY  EKG EKG Interpretation Date/Time:  Saturday May 24 2023 20:14:53 EDT Ventricular Rate:  56 PR Interval:  224 QRS Duration:  92 QT Interval:  454 QTC Calculation: 438 R Axis:   34  Text Interpretation: Sinus bradycardia with 1st degree A-V block Otherwise normal ECG When compared with ECG of 13-Jun-2021 16:29, PREVIOUS ECG IS PRESENT Confirmed by Elise Guile [id number]) on 05/24/2023 9:08:28 PM  Radiology DG Chest Port 1 View Result Date: 05/24/2023 CLINICAL DATA:  Left chest pain x1 week EXAM: PORTABLE CHEST 1 VIEW COMPARISON:   06/13/2021 FINDINGS: Lungs are clear.  No pleural effusion or pneumothorax. The heart is top-normal in size. IMPRESSION: No active disease. Electronically Signed   By: Zadie Herter M.D.   On: 05/24/2023 22:42    Procedures Procedures  {Document cardiac monitor, telemetry assessment procedure when appropriate:1}  Medications Ordered in ED Medications - No data to display  ED Course/ Medical Decision Making/ A&P   {   Click here for ABCD2, HEART and other calculatorsREFRESH Note before signing :1}                              Medical Decision Making Amount and/or Complexity of Data Reviewed Labs: ordered.  Risk OTC drugs.   This patient presents to the ED for concern of chest pain, this involves an extensive number of treatment options, and is a complaint that carries with it a high risk of complications and morbidity.  The differential diagnosis includes but is not limited to acute coronary syndrome, aortic dissection, pulmonary embolism, pericarditis, pneumothorax, pneumonia, myocarditis, pleurisy, esophageal rupture    Comorbidities that complicate the patient evaluation: Patient's presentation is complicated by their history of hypertension  Social Determinants of Health: Patient's  limited english proficiency   increases the complexity of managing their presentation  Additional history obtained: Additional history obtained from family Records reviewed Primary Care Documents  Lab Tests: I Ordered, and personally interpreted labs.  The pertinent results include:  elevated troponin  Imaging Studies ordered: I ordered imaging studies including X-ray chest    I independently visualized and interpreted imaging which showed no acute findings I agree with the radiologist interpretation  Cardiac Monitoring: The patient was maintained on a cardiac monitor.  I personally viewed and interpreted the cardiac monitor which showed an underlying rhythm of:  sinus  rhythm  Medicines ordered and prescription drug management: I ordered medication including ASA  for chest pain (pt denies ASA allergy)  Reevaluation of the patient after these medicines showed that the patient    {resolved/improved/worsened:23923::"improved"}  Test Considered: Patient is low risk / negative by ***, therefore do not feel that *** is indicated.  Critical Interventions:  ***  Consultations Obtained: I requested consultation with the {consultation:26851}, and discussed  findings as well as pertinent plan - they recommend: ***  Reevaluation: After the interventions noted above, I reevaluated the patient and found that they have :{resolved/improved/worsened:23923::"improved"}  Complexity of problems addressed: Patient's presentation is most consistent with  acute presentation with potential threat to life or bodily function  Disposition: After consideration of the diagnostic results and the patient's response to treatment,  I feel that the patent would benefit from admission   .     {Document critical care time when appropriate:1} {Document review of labs and clinical decision tools ie heart score, Chads2Vasc2 etc:1}  {Document your independent review of radiology images, and any outside records:1} {Document your discussion with family members,  caretakers, and with consultants:1} {Document social determinants of health affecting pt's care:1} {Document your decision making why or why not admission, treatments were needed:1} Final Clinical Impression(s) / ED Diagnoses Final diagnoses:  None    Rx / DC Orders ED Discharge Orders     None

## 2023-05-25 ENCOUNTER — Observation Stay (HOSPITAL_BASED_OUTPATIENT_CLINIC_OR_DEPARTMENT_OTHER)

## 2023-05-25 ENCOUNTER — Other Ambulatory Visit: Payer: Self-pay

## 2023-05-25 DIAGNOSIS — I2510 Atherosclerotic heart disease of native coronary artery without angina pectoris: Secondary | ICD-10-CM | POA: Diagnosis not present

## 2023-05-25 DIAGNOSIS — R072 Precordial pain: Secondary | ICD-10-CM | POA: Diagnosis not present

## 2023-05-25 DIAGNOSIS — E785 Hyperlipidemia, unspecified: Secondary | ICD-10-CM

## 2023-05-25 DIAGNOSIS — R7989 Other specified abnormal findings of blood chemistry: Secondary | ICD-10-CM | POA: Diagnosis present

## 2023-05-25 DIAGNOSIS — R079 Chest pain, unspecified: Secondary | ICD-10-CM

## 2023-05-25 DIAGNOSIS — I471 Supraventricular tachycardia, unspecified: Secondary | ICD-10-CM

## 2023-05-25 DIAGNOSIS — M818 Other osteoporosis without current pathological fracture: Secondary | ICD-10-CM | POA: Diagnosis not present

## 2023-05-25 DIAGNOSIS — I2089 Other forms of angina pectoris: Secondary | ICD-10-CM | POA: Diagnosis present

## 2023-05-25 DIAGNOSIS — I10 Essential (primary) hypertension: Secondary | ICD-10-CM

## 2023-05-25 DIAGNOSIS — R7303 Prediabetes: Secondary | ICD-10-CM

## 2023-05-25 LAB — LIPID PANEL
Cholesterol: 196 mg/dL (ref 0–200)
HDL: 58 mg/dL (ref >40)
LDL Cholesterol: 125 mg/dL — ABNORMAL HIGH (ref 0–99)
Total CHOL/HDL Ratio: 3.4 ratio
Triglycerides: 64 mg/dL (ref <150)
VLDL: 13 mg/dL (ref 0–40)

## 2023-05-25 LAB — ECHOCARDIOGRAM COMPLETE
AR max vel: 2.24 cm2
AV Peak grad: 8.6 mmHg
Ao pk vel: 1.47 m/s
Area-P 1/2: 3.91 cm2
Height: 66 in
S' Lateral: 2.8 cm
Weight: 2736 [oz_av]

## 2023-05-25 LAB — SURGICAL PCR SCREEN
MRSA, PCR: NEGATIVE
Staphylococcus aureus: NEGATIVE

## 2023-05-25 LAB — HEMOGLOBIN A1C
Hgb A1c MFr Bld: 5.8 % — ABNORMAL HIGH (ref 4.8–5.6)
Mean Plasma Glucose: 119.76 mg/dL

## 2023-05-25 LAB — TSH: TSH: 1.244 u[IU]/mL (ref 0.350–4.500)

## 2023-05-25 MED ORDER — NITROGLYCERIN 0.4 MG SL SUBL: 0.4000 mg | SUBLINGUAL_TABLET | SUBLINGUAL | Status: DC | PRN

## 2023-05-25 MED ORDER — ONDANSETRON HCL 4 MG/2ML IJ SOLN
4.0000 mg | Freq: Four times a day (QID) | INTRAMUSCULAR | Status: DC | PRN
Start: 2023-05-25 — End: 2023-05-26

## 2023-05-25 MED ORDER — METOPROLOL SUCCINATE ER 25 MG PO TB24
25.0000 mg | ORAL_TABLET | Freq: Every day | ORAL | Status: DC
  Administered 2023-05-25 – 2023-05-26 (×2): 25 mg via ORAL
  Filled 2023-05-25 (×2): qty 1

## 2023-05-25 MED ORDER — HEPARIN SODIUM (PORCINE) 5000 UNIT/ML IJ SOLN
5000.0000 [IU] | Freq: Three times a day (TID) | INTRAMUSCULAR | Status: DC
  Administered 2023-05-25 – 2023-05-26 (×4): 5000 [IU] via SUBCUTANEOUS
  Filled 2023-05-25 (×4): qty 1

## 2023-05-25 MED ORDER — OXYCODONE HCL 5 MG PO TABS: 5.0000 mg | ORAL_TABLET | ORAL | Status: DC | PRN

## 2023-05-25 MED ORDER — ACETAMINOPHEN 325 MG PO TABS
650.0000 mg | ORAL_TABLET | Freq: Four times a day (QID) | ORAL | Status: DC | PRN
Start: 2023-05-25 — End: 2023-05-26

## 2023-05-25 MED ORDER — ACETAMINOPHEN 325 MG PO TABS
650.0000 mg | ORAL_TABLET | ORAL | Status: DC | PRN
  Administered 2023-05-25: 650 mg via ORAL
  Filled 2023-05-25: qty 2

## 2023-05-25 MED ORDER — ALBUTEROL SULFATE (2.5 MG/3ML) 0.083% IN NEBU: 2.5000 mg | INHALATION_SOLUTION | Freq: Four times a day (QID) | RESPIRATORY_TRACT | Status: DC | PRN

## 2023-05-25 MED ORDER — SODIUM CHLORIDE 0.9 % IV SOLN: INTRAVENOUS | Status: DC

## 2023-05-25 MED ORDER — ASPIRIN 81 MG PO TBEC
81.0000 mg | DELAYED_RELEASE_TABLET | Freq: Every day | ORAL | Status: DC
  Administered 2023-05-26: 81 mg via ORAL
  Filled 2023-05-25: qty 1

## 2023-05-25 MED ORDER — PRAVASTATIN SODIUM 40 MG PO TABS
40.0000 mg | ORAL_TABLET | Freq: Every day | ORAL | Status: DC
  Administered 2023-05-25: 40 mg via ORAL
  Filled 2023-05-25: qty 1

## 2023-05-25 MED ORDER — HYDRALAZINE HCL 20 MG/ML IJ SOLN: 10.0000 mg | INTRAMUSCULAR | Status: DC | PRN

## 2023-05-25 MED ORDER — ACETAMINOPHEN 650 MG RE SUPP: 650.0000 mg | Freq: Four times a day (QID) | RECTAL | Status: DC | PRN

## 2023-05-25 MED ORDER — FENTANYL CITRATE PF 50 MCG/ML IJ SOSY: 25.0000 ug | PREFILLED_SYRINGE | INTRAMUSCULAR | Status: DC | PRN

## 2023-05-25 MED ORDER — MUPIROCIN 2 % EX OINT
1.0000 | TOPICAL_OINTMENT | Freq: Two times a day (BID) | CUTANEOUS | Status: DC
  Administered 2023-05-25 – 2023-05-26 (×2): 1 via NASAL
  Filled 2023-05-25: qty 22

## 2023-05-25 NOTE — Plan of Care (Signed)
  Problem: Clinical Measurements: Goal: Will remain free from infection Outcome: Progressing Goal: Respiratory complications will improve Outcome: Progressing Goal: Cardiovascular complication will be avoided Outcome: Progressing   Problem: Coping: Goal: Level of anxiety will decrease Outcome: Progressing   Problem: Safety: Goal: Ability to remain free from injury will improve Outcome: Progressing   Problem: Cardiac: Goal: Ability to achieve and maintain adequate cardiovascular perfusion will improve Outcome: Progressing

## 2023-05-25 NOTE — Consult Note (Signed)
 Cardiology Consultation   Patient ID: Ariel Harris MRN: [id number]; DOB: 05/11/1945  Admit date: 05/24/2023 Date of Consult: 05/25/2023  PCP:  Everlyn Hockey, MD   [facility] Providers Cardiologist:  Antoinette Batman, MD        Patient Profile:   Ariel Harris is a 78 y.o. female with a hx of HTN, HL who is being seen 05/25/2023 for the evaluation of chest pain and elevated troponin at the request of Dr. Felipe Horton.  History of Present Illness:   Ariel Harris 78 year old Here with chest discomfort with prior history of SVT PACs, prior nuclear stress test 2015 was low risk no ischemia.  She was previously seen for chest pain many years ago however has not had any recurrence until now.  Her chest pain currently has been described as exertional, when pushing boxes or lifting relieved with rest.  Troponins here in the hospital are minimally elevated at 24 and 23.  I did have the ability to look at a prior CT scan of her chest from 2022 and coronary artery calcifications are seen in the proximal LAD as well as proximal RCA and mid RCA distribution.  EKG shows nonspecific ST-T wave changes.  She takes metoprolol  25 mg twice a day in the past as well as pravastatin  40 mg a day  I spoke with [id number], her granddaughter who helps provide historical items.   Past Medical History:  Diagnosis Date   Arthritis    "knees" (05/16/2017)   Chest pain    Hyperlipidemia    Hypertension    PAC (premature atrial contraction)    SVT (supraventricular tachycardia) (HCC)     Past Surgical History:  Procedure Laterality Date   JOINT REPLACEMENT     KNEE ARTHROSCOPY Left 1996   KNEE SURGERY Right [phone]   "open surgery; because of the pain"   TOTAL KNEE ARTHROPLASTY Right 05/16/2017   TOTAL KNEE ARTHROPLASTY Right 05/16/2017   Procedure: RIGHT TOTAL KNEE ARTHROPLASTY;  Surgeon: Wes Hamman, MD;  Location: [facility] OR;  Service: Orthopedics;  Laterality: Right;   TOTAL KNEE  ARTHROPLASTY Left 11/16/2018   Procedure: LEFT TOTAL KNEE ARTHROPLASTY;  Surgeon: Wes Hamman, MD;  Location: [facility] OR;  Service: Orthopedics;  Laterality: Left;     Home Medications:  Prior to Admission medications   Medication Sig Start Date End Date Taking? Authorizing Provider  albuterol (VENTOLIN HFA) 108 (90 Base) MCG/ACT inhaler Inhale 2 puffs into the lungs every 6 (six) hours as needed for wheezing or shortness of breath. 05/07/23   [provider]  alendronate  (FOSAMAX ) 70 MG tablet TAKE 1 TABLET BY MOUTH ONCE A WEEK. TAKE WITH A FULL GLASS OF WATER  ON AN EMPTY STOMACH. 12/22/21   Arcadio Knuckles, MD  cetirizine (ZYRTEC) 10 MG tablet Take 1 tablet by mouth daily. 05/07/23 05/06/24  [provider]  fluticasone (FLONASE) 50 MCG/ACT nasal spray Place 1 spray into both nostrils. 05/07/23   [provider]  methocarbamol  (ROBAXIN ) 500 MG tablet Take 1 tablet (500 mg total) by mouth 2 (two) times daily. 06/13/21   Henderly, Britni A, PA-C  metoprolol  succinate (TOPROL -XL) 25 MG 24 hr tablet TAKE 1 TABLET BY MOUTH DAILY, MAY TAKE 1 EXTRA TABLET BY MOUTH DAILY ONLY AS NEEDED FOR PALPITATIONS 02/26/22   Arcadio Knuckles, MD  pravastatin  (PRAVACHOL ) 40 MG tablet TAKE 1 TABLET BY MOUTH EVERY DAY 12/21/21   Arcadio Knuckles, MD    Inpatient Medications: Scheduled Meds:  [  START ON 05/26/2023] aspirin  EC  81 mg Oral Daily   heparin  5,000 Units Subcutaneous Q8H   metoprolol  succinate  25 mg Oral Daily   pravastatin   40 mg Oral QHS   Continuous Infusions:  PRN Meds: acetaminophen  **OR** acetaminophen , albuterol, fentaNYL  (SUBLIMAZE ) injection, hydrALAZINE, nitroGLYCERIN , ondansetron  (ZOFRAN ) IV, oxyCODONE   Allergies:    Allergies  Allergen Reactions   Indomethacin Swelling   Tramadol      Sweaty, made feel bad    Social History:   Social History   Socioeconomic History   Marital status: Divorced    Spouse name: Not on file   Number of children: 2   Years of  education: Not on file   Highest education level: Not on file  Occupational History   Not on file  Tobacco Use   Smoking status: Never   Smokeless tobacco: Never  Vaping Use   Vaping status: Never Used  Substance and Sexual Activity   Alcohol use: No   Drug use: No   Sexual activity: Not Currently  Other Topics Concern   Not on file  Social History Narrative   Negative family `history--Specifically artery disease or MI. The patient has two siblings died at a young age from fevers.   The patient is separated and single. 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 She has never drink or smoke or done drugs   Social Drivers of Corporate investment banker Strain: Not on File (02/19/2022)   Received from [facility], [employer]    Financial Resource Strain: 0  Food Insecurity: No Food Insecurity (05/25/2023)   Hunger Vital Sign    Worried About Running Out of Food in the Last Year: Never true    Ran Out of Food in the Last Year: Never true  Transportation Needs: No Transportation Needs (05/25/2023)   PRAPARE - Administrator, Civil Service (Medical): No    Lack of Transportation (Non-Medical): No  Physical Activity: Not on File (02/19/2022)   Received from [place], [place]   Physical Activity    Physical Activity: 0  Stress: Not on File (02/19/2022)   [facility] from [facility], [facility]   Stress    Stress: 0  Social Connections: Moderately Isolated (05/25/2023)   Social Connection and Isolation Panel [NHANES]    Frequency of Communication with Friends and Family: More than three times a week    Frequency of Social Gatherings with Friends and Family: More than three times a week    Attends Religious Services: More than 4 times per year    Active Member of [employer] or Organizations: No    Attends Banker Meetings: Never    Marital Status: Widowed  Intimate Partner Violence: Not At Risk (05/25/2023)    Humiliation, Afraid, Rape, and Kick questionnaire    Fear of Current or Ex-Partner: No    Emotionally Abused: No    Physically Abused: No    Sexually Abused: No    Family History:    Family History  Problem Relation Age of Onset   Coronary artery disease Neg Hx    Heart attack Neg Hx    Breast cancer Neg Hx      ROS:  Please see the history of present illness.   All other ROS reviewed and negative.     Physical Exam/Data:   Vitals:   05/25/23 0981 05/25/23 1914 05/25/23 7829 05/25/23 5621  BP: (!) 159/91 (!) 159/91  139/76  Pulse: (!) 58 61  (!) 57  Resp: 19   18  Temp:    97.8 F (36.6 C)  TempSrc:    Oral  SpO2: 96%  96% 96%  Weight:      Height:       No intake or output data in the 24 hours ending 05/25/23 1214    05/25/2023    4:25 AM 05/24/2023    8:20 PM 08/28/2021    9:22 AM  Last 3 Weights  Weight (lbs) 171 lb 163 lb 2.3 oz 164 lb  Weight (kg) 77.565 kg 74 kg 74.39 kg     Body mass index is 27.6 kg/m.  General:  Well nourished, well developed, in no acute distress HEENT: normal Neck: no JVD Vascular: No carotid bruits; Distal pulses 2+ bilaterally Cardiac:  normal S1, S2; RRR; no murmur  Lungs:  clear to auscultation bilaterally, no wheezing, rhonchi or rales  Abd: soft, nontender, no hepatomegaly  Ext: no edema Musculoskeletal:  No deformities, BUE and BLE strength normal and equal Skin: warm and dry  Neuro:  CNs 2-12 intact, no focal abnormalities noted Psych:  Normal affect   EKG:  The EKG was personally reviewed and demonstrates: EKG shows sinus bradycardia rate 56 with first-degree AV block  Telemetry:  Telemetry was personally reviewed and demonstrates:    Relevant CV Studies: CT scan from 11/06/2020 of the chest demonstrates proximal LAD as well as proximal RCA calcification.  Aortic atherosclerosis also noted.  Laboratory Data:  High Sensitivity Troponin:  No results for input(s): "TROPONINIHS" in the last 720 hours.    Chemistry Recent Labs  Lab 05/24/23 2030  NA 138  K 3.8  CL 105  CO2 25  GLUCOSE 86  BUN 13  CREATININE 0.84  CALCIUM 9.9  GFRNONAA >60  ANIONGAP 9    No results for input(s): "PROT", "ALBUMIN", "AST", "ALT", "ALKPHOS", "BILITOT" in the last 168 hours. Lipids  Recent Labs  Lab 05/25/23 1052  CHOL 196  TRIG 64  HDL 58  LDLCALC 125*  CHOLHDL 3.4    Hematology Recent Labs  Lab 05/24/23 2030  WBC 5.1  RBC 4.94  HGB 14.5  HCT 44.2  MCV 89.5  MCH 29.4  MCHC 32.8  RDW 12.9  PLT 197   Thyroid   Recent Labs  Lab 05/25/23 1052  TSH 1.244    BNPNo results for input(s): "BNP", "PROBNP" in the last 168 hours.  DDimer No results for input(s): "DDIMER" in the last 168 hours.   Radiology/Studies:  DG Chest Port 1 View Result Date: 05/24/2023 CLINICAL DATA:  Left chest pain x1 week EXAM: PORTABLE CHEST 1 VIEW COMPARISON:  06/13/2021 FINDINGS: Lungs are clear.  No pleural effusion or pneumothorax. The heart is top-normal in size. IMPRESSION: No active disease. Electronically Signed   By: Zadie Herter M.D.   On: 05/24/2023 22:42     Assessment and Plan:   78 year old with intermittent chest pain with prior history of hypertension hyperlipidemia SVTs PACs.  Troponin 24-23 chest x-ray normal  Acute chest pain - 4-day history of intermittent left-sided chest pain with exertion heavy lifting with increased fatigue.  Flat troponin but elevated.  She has had chest pain in the past with normal stress test. - Aspirin  administered - Awaiting echocardiogram - Cardiac telemetry.  Prior stress test in 2013 and 2015 were normal. -She does have coronary artery calcifications noted on prior CT scan in the proximal LAD as  well as RCA distribution.  I think given the exertional component of the chest discomfort with mildly elevated troponin, it does make sense for us  to go ahead and proceed with left heart catheterization tomorrow.  Risk and benefits of been explained including  stroke heart attack death.  I spoke with Sherline Distel her granddaughter as well as patient.  Hypertension with previous PACs SVT - TSH pending, continue with metoprolol  follow telemetry.  Has hydralazine as needed.  Hyperlipidemia --Continue with pravastatin , lipid panel    Risk Assessment/Risk Scores:     TIMI Risk Score for Unstable Angina or Non-ST Elevation MI:   The patient's TIMI risk score is 5, which indicates a 26% risk of all cause mortality, new or recurrent myocardial infarction or need for urgent revascularization in the next 14 days.          For questions or updates, please contact [facility] Please consult [email or web address] for contact info under    Signed, Dorothye Gathers, MD  05/25/2023 12:14 PM

## 2023-05-25 NOTE — Progress Notes (Signed)
 Echocardiogram 2D Echocardiogram has been performed.  Ariel Harris 05/25/2023, 3:24 PM

## 2023-05-25 NOTE — Plan of Care (Signed)

## 2023-05-25 NOTE — Progress Notes (Signed)
 Patients granddaughter Ariel Harris is her point of contact please call with updates and questions.   [phone]

## 2023-05-25 NOTE — Progress Notes (Signed)
 Plan of Care Note for accepted transfer   Patient: Ariel Harris MRN: [id number]   DOA: 05/24/2023  Facility requesting transfer: [facility] Requesting Provider: Eldon Greenland, MD Reason for transfer: Chest pain, rule out ACS. Facility course: 78 year old female with history of essential hypertension, dyslipidemia, SVT, prediabetes and osteoporosis, who presents to the emergency room with acute onset of moderate gradual left-sided sharp chest pain with intermittent nature and associated with shortness of breath without diaphoresis or nausea or vomiting.  No cough or wheezing or fever or chills.  When she came to the ER, BP was 173/87 and later 183/91 with a heart rate of 58 and otherwise normal vital signs. BP later on was 160/80. CBC and BMP were within normal.  Initial troponins were 23 and 24.  EKG showed sinus bradycardia with a rate of 56 with first-degree AV block. Portable chest x-ray showed no acute cardiopulmonary disease.  The patient was given 4 baby aspirin .  She will be admitted to an observation cardiac telemetry bed at [facility] for further evaluation and management.  Plan of care: The patient is accepted for admission to Telemetry unit, at [facility]..  The patient will be under the care and responsibility of the EDP until arrival to [facility].  Author: Virgene Griffin, MD 05/25/2023  Check [email or web address] for on-call coverage.  Nursing staff, Please call [facility] Admits & Consults System-Wide number on Amion as soon as patient's arrival, so appropriate admitting provider can evaluate the pt.

## 2023-05-25 NOTE — H&P (Addendum)
 History and Physical    Patient: Ariel Harris ZOX:[id number] DOB: 17-May-1945 DOA: 05/24/2023 DOS: the patient was seen and examined on 05/25/2023 PCP: Everlyn Hockey, MD  Patient coming from: Transfer from [facility]  Chief Complaint:  Chief Complaint  Patient presents with   Chest Pain   HPI: Shalissa Waheed is a 78 y.o. female with medical history significant of essential hypertension, dyslipidemia, SVT, PACs, prediabetes, arthritis, and osteoporosis presents with chest pain.  History is obtained with use of [place] interpreter.  She has been experiencing intermittent chest pain for the past four days, located on the left side of her chest. The pain occurs particularly during physical exertion, such as walking long distances or engaging in heavy lifting activities, and is described as sharp. It is relieved by rest, and she has not taken any medication for the pain.  She is not on daily aspirin .  She did report having increased fatigue.  No other associated symptoms such as nausea , vomiting, shortness of breath, or sweating during the episodes of chest pain.   she denies recent travel, leg swelling, or calf pain. She mentions a left total knee replacement in 2020, for which she still has pain with walking, but it is not related to the current chest pain.  Review of records note patient had normal stress test previously in 2013 and 2015.  In the emergency department patient was noted to be afebrile with pulse 56-64, respirations 15-22, blood pressure elevated up to 183/91, and O2 saturations maintained on room air.  EKG sinus bradycardia without significant ischemic changes.  Labs significant for high-sensitivity troponins 24->23 and hemoglobin A1c 5.8.  Lipid panel and lipoprotein a were pending.  Chest x-ray showed no acute abnormality.  Patient was given 324 mg of aspirin .  Review of Systems: As mentioned in the history of present illness. All other systems reviewed and are  negative. Past Medical History:  Diagnosis Date   Arthritis    "knees" (05/16/2017)   Chest pain    Hyperlipidemia    Hypertension    PAC (premature atrial contraction)    SVT (supraventricular tachycardia) (HCC)    Past Surgical History:  Procedure Laterality Date   JOINT REPLACEMENT     KNEE ARTHROSCOPY Left 1996   KNEE SURGERY Right [id number]   "open surgery; because of the pain"   TOTAL KNEE ARTHROPLASTY Right 05/16/2017   TOTAL KNEE ARTHROPLASTY Right 05/16/2017   Procedure: RIGHT TOTAL KNEE ARTHROPLASTY;  Surgeon: Wes Hamman, MD;  Location: [facility] OR;  Service: Orthopedics;  Laterality: Right;   TOTAL KNEE ARTHROPLASTY Left 11/16/2018   Procedure: LEFT TOTAL KNEE ARTHROPLASTY;  Surgeon: Wes Hamman, MD;  Location: [facility] OR;  Service: Orthopedics;  Laterality: Left;   Social History:  reports that she has never smoked. She has never used smokeless tobacco. She reports that she does not drink alcohol and does not use drugs.  Allergies  Allergen Reactions   Indomethacin Swelling   Tramadol      Sweaty, made feel bad    Family History  Problem Relation Age of Onset   Coronary artery disease Neg Hx    Heart attack Neg Hx    Breast cancer Neg Hx     Prior to Admission medications   Medication Sig Start Date End Date Taking? Authorizing Provider  alendronate  (FOSAMAX ) 70 MG tablet TAKE 1 TABLET BY MOUTH ONCE A WEEK. TAKE WITH A FULL GLASS OF WATER  ON AN EMPTY STOMACH. 12/22/21  Arcadio Knuckles, MD  methocarbamol  (ROBAXIN ) 500 MG tablet Take 1 tablet (500 mg total) by mouth 2 (two) times daily. 06/13/21   Henderly, Britni A, PA-C  metoprolol  succinate (TOPROL -XL) 25 MG 24 hr tablet TAKE 1 TABLET BY MOUTH DAILY, MAY TAKE 1 EXTRA TABLET BY MOUTH DAILY ONLY AS NEEDED FOR PALPITATIONS 02/26/22   Arcadio Knuckles, MD  pravastatin  (PRAVACHOL ) 40 MG tablet TAKE 1 TABLET BY MOUTH EVERY DAY 12/21/21   Arcadio Knuckles, MD    Physical Exam: Vitals:   05/25/23 0330 05/25/23 0425  05/25/23 0527 05/25/23 0546  BP: 132/66 (!) 178/88 (!) 159/91 (!) 159/91  Pulse: (!) 56 64 (!) 58 61  Resp: 15 16 19    Temp:  98 F (36.7 C)    TempSrc:  Oral    SpO2: 97% 98% 96%   Weight:  77.6 kg    Height:  5\' 6"  (1.676 m)     Constitutional: Elderly female currently in no acute distress Eyes: PERRL, lids and conjunctivae normal ENMT: Mucous membranes are moist.  Normal dentition.  Neck: normal, supple, no JVD appreciated Respiratory: clear to auscultation bilaterally, no wheezing, no crackles. Normal respiratory effort. No accessory muscle use.  Cardiovascular: Bradycardic with +2/6 murmur present.. No extremity edema. 2+ pedal pulses.   Abdomen: no tenderness, no masses palpated.  Bowel sounds positive.  Musculoskeletal: no clubbing / cyanosis. No joint deformity upper and lower extremities. Good ROM, no contractures. Normal muscle tone.  Skin: no rashes, lesions, ulcers. No induration Neurologic: CN 2-12 grossly intact. Strength 5/5 in all 4.  Psychiatric: Normal judgment and insight. Alert and oriented x 3. Normal mood.   Data Reviewed:  EKG revealed sinus bradycardia at 56 bpm with first-degree heart block, but no ischemic changes appreciated.  Reviewed labs, imaging, and pertinent records as documented. Assessment and Plan:  Chest pain Elevated troponin Suspected stable angina Acute.  Patient presents with a 4-day history of intermittent left-sided chest pain with exertion or heavy lifting and increased fatigue.  On physical exam patient noted to have a murmur present.  Initial troponins mildly elevated, but flat 24->23.  She is not normally on aspirin .  Patient had been given full dose aspirin .  Records note previous stress test performed in 2013 and 2015 were reported to be normal.    - Admit to a cardiac telemetry bed - Follow-up lipid panel and lipoprotein a - Check echocardiogram - Cardiology consulted to evaluate for possible need of stress testing  History of  SVT/PACs Hypertension Blood pressures noted to be 132/66 to 183/91.  Currently in sinus bradycardia. - Add-on TSH - Continue metoprolol  - Follow-up telemetry - Hydralazine as needed  Hyperlipidemia - Follow-up lipid panel - Continue pravastatin   Prediabetes Hemoglobin A1c was noted to be 5.8.  DVT prophylaxis: Lovenox  Advance Care Planning:   Code Status: Full Code   Consults: Cardiology  Family Communication: Granddaughter updated   Severity of Illness: The appropriate patient status for this patient is OBSERVATION. Observation status is judged to be reasonable and necessary in order to provide the required intensity of service to ensure the patient's safety. The patient's presenting symptoms, physical exam findings, and initial radiographic and laboratory data in the context of their medical condition is felt to place them at decreased risk for further clinical deterioration. Furthermore, it is anticipated that the patient will be medically stable for discharge from the hospital within 2 midnights of admission.   Author: Lena Qualia, MD 05/25/2023 7:30 AM  For on call review [email or web address].

## 2023-05-26 ENCOUNTER — Ambulatory Visit (HOSPITAL_COMMUNITY)
Admission: EM | Disposition: A | Discharge status: Home / Self Care | Payer: Self-pay | Source: Home / Self Care | Attending: Emergency Medicine

## 2023-05-26 DIAGNOSIS — I2089 Other forms of angina pectoris: Secondary | ICD-10-CM | POA: Diagnosis not present

## 2023-05-26 DIAGNOSIS — R072 Precordial pain: Principal | ICD-10-CM

## 2023-05-26 DIAGNOSIS — I471 Supraventricular tachycardia, unspecified: Secondary | ICD-10-CM | POA: Diagnosis not present

## 2023-05-26 DIAGNOSIS — E785 Hyperlipidemia, unspecified: Secondary | ICD-10-CM | POA: Diagnosis not present

## 2023-05-26 DIAGNOSIS — R079 Chest pain, unspecified: Secondary | ICD-10-CM | POA: Diagnosis not present

## 2023-05-26 DIAGNOSIS — R7303 Prediabetes: Secondary | ICD-10-CM | POA: Diagnosis not present

## 2023-05-26 HISTORY — PX: LEFT HEART CATH AND CORONARY ANGIOGRAPHY: CATH118249

## 2023-05-26 LAB — CBC
HCT: 43.4 % (ref 36.0–46.0)
Hemoglobin: 14.1 g/dL (ref 12.0–15.0)
MCH: 29.4 pg (ref 26.0–34.0)
MCHC: 32.5 g/dL (ref 30.0–36.0)
MCV: 90.4 fL (ref 80.0–100.0)
Platelets: 177 10*3/uL (ref 150–400)
RBC: 4.8 MIL/uL (ref 3.87–5.11)
RDW: 12.9 % (ref 11.5–15.5)
WBC: 4.8 10*3/uL (ref 4.0–10.5)
nRBC: 0 % (ref 0.0–0.2)

## 2023-05-26 LAB — COMPREHENSIVE METABOLIC PANEL WITH GFR
ALT: 14 U/L (ref 0–44)
AST: 19 U/L (ref 15–41)
Albumin: 3.2 g/dL — ABNORMAL LOW (ref 3.5–5.0)
Alkaline Phosphatase: 27 U/L — ABNORMAL LOW (ref 38–126)
Anion gap: 8 (ref 5–15)
BUN: 18 mg/dL (ref 8–23)
CO2: 26 mmol/L (ref 22–32)
Calcium: 8.8 mg/dL — ABNORMAL LOW (ref 8.9–10.3)
Chloride: 106 mmol/L (ref 98–111)
Creatinine, Ser: 0.86 mg/dL (ref 0.44–1.00)
GFR, Estimated: >60 mL/min (ref >60)
Glucose, Bld: 113 mg/dL — ABNORMAL HIGH (ref 70–99)
Potassium: 4.1 mmol/L (ref 3.5–5.1)
Sodium: 140 mmol/L (ref 135–145)
Total Bilirubin: 1.2 mg/dL (ref 0.0–1.2)
Total Protein: 6.6 g/dL (ref 6.5–8.1)

## 2023-05-26 SURGERY — LEFT HEART CATH AND CORONARY ANGIOGRAPHY
Anesthesia: LOCAL

## 2023-05-26 MED ORDER — ROSUVASTATIN CALCIUM 20 MG PO TABS
40.0000 mg | ORAL_TABLET | Freq: Every day | ORAL | Status: DC
  Administered 2023-05-26: 40 mg via ORAL
  Filled 2023-05-26: qty 2

## 2023-05-26 MED ORDER — FENTANYL CITRATE (PF) 100 MCG/2ML IJ SOLN
INTRAMUSCULAR | Status: DC | PRN
  Administered 2023-05-26: 25 ug via INTRAVENOUS

## 2023-05-26 MED ORDER — HEPARIN SODIUM (PORCINE) 1000 UNIT/ML IJ SOLN
INTRAMUSCULAR | Status: AC
  Filled 2023-05-26: qty 10

## 2023-05-26 MED ORDER — MIDAZOLAM HCL 2 MG/2ML IJ SOLN
INTRAMUSCULAR | Status: AC
  Filled 2023-05-26: qty 2

## 2023-05-26 MED ORDER — IOHEXOL 350 MG/ML SOLN
INTRAVENOUS | Status: DC | PRN
  Administered 2023-05-26: 35 mL

## 2023-05-26 MED ORDER — LIDOCAINE HCL (PF) 1 % IJ SOLN
INTRAMUSCULAR | Status: AC
  Filled 2023-05-26: qty 30

## 2023-05-26 MED ORDER — LIDOCAINE HCL (PF) 1 % IJ SOLN
INTRAMUSCULAR | Status: DC | PRN
  Administered 2023-05-26: 2 mL

## 2023-05-26 MED ORDER — HEPARIN (PORCINE) IN NACL 1000-0.9 UT/500ML-% IV SOLN
INTRAVENOUS | Status: DC | PRN
  Administered 2023-05-26 (×2): 500 mL

## 2023-05-26 MED ORDER — HEPARIN SODIUM (PORCINE) 1000 UNIT/ML IJ SOLN
INTRAMUSCULAR | Status: DC | PRN
  Administered 2023-05-26: 4000 [IU] via INTRAVENOUS

## 2023-05-26 MED ORDER — VERAPAMIL HCL 2.5 MG/ML IV SOLN
INTRAVENOUS | Status: AC
  Filled 2023-05-26: qty 2

## 2023-05-26 MED ORDER — ROSUVASTATIN CALCIUM 40 MG PO TABS: 40.0000 mg | ORAL_TABLET | Freq: Every day | ORAL | 3 refills | Status: AC

## 2023-05-26 MED ORDER — MIDAZOLAM HCL 2 MG/2ML IJ SOLN
INTRAMUSCULAR | Status: DC | PRN
Start: 2023-05-26 — End: 2023-05-26
  Administered 2023-05-26: 1 mg via INTRAVENOUS

## 2023-05-26 MED ORDER — ASPIRIN 81 MG PO TBEC: 81.0000 mg | DELAYED_RELEASE_TABLET | Freq: Every day | ORAL | Status: AC

## 2023-05-26 MED ORDER — FENTANYL CITRATE (PF) 100 MCG/2ML IJ SOLN
INTRAMUSCULAR | Status: AC
  Filled 2023-05-26: qty 2

## 2023-05-26 SURGICAL SUPPLY — 8 items
CATH 5FR JL3.5 JR4 ANG PIG MP (CATHETERS) IMPLANT
CATH INFINITI 5FR JL4 (CATHETERS) IMPLANT
CATH INFINITI JR4 5F (CATHETERS) IMPLANT
DEVICE RAD COMP TR BAND LRG (VASCULAR PRODUCTS) IMPLANT
GLIDESHEATH SLEND SS 6F .021 (SHEATH) IMPLANT
GUIDEWIRE INQWIRE 1.5J.035X260 (WIRE) IMPLANT
PACK CARDIAC CATHETERIZATION (CUSTOM PROCEDURE TRAY) ×1 IMPLANT
SET ATX-X65L (MISCELLANEOUS) IMPLANT

## 2023-05-26 NOTE — Plan of Care (Signed)
  Problem: Education: Goal: Knowledge of General Education information will improve Description: Including pain rating scale, medication(s)/side effects and non-pharmacologic comfort measures Outcome: Adequate for Discharge   Problem: Health Behavior/Discharge Planning: Goal: Ability to manage health-related needs will improve Outcome: Adequate for Discharge   Problem: Clinical Measurements: Goal: Ability to maintain clinical measurements within normal limits will improve Outcome: Adequate for Discharge Goal: Will remain free from infection Outcome: Adequate for Discharge Goal: Diagnostic test results will improve Outcome: Adequate for Discharge Goal: Respiratory complications will improve Outcome: Adequate for Discharge Goal: Cardiovascular complication will be avoided Outcome: Adequate for Discharge   Problem: Activity: Goal: Risk for activity intolerance will decrease Outcome: Adequate for Discharge   Problem: Nutrition: Goal: Adequate nutrition will be maintained Outcome: Adequate for Discharge   Problem: Coping: Goal: Level of anxiety will decrease Outcome: Adequate for Discharge   Problem: Elimination: Goal: Will not experience complications related to bowel motility Outcome: Adequate for Discharge Goal: Will not experience complications related to urinary retention Outcome: Adequate for Discharge   Problem: Pain Managment: Goal: General experience of comfort will improve and/or be controlled Outcome: Adequate for Discharge   Problem: Safety: Goal: Ability to remain free from injury will improve Outcome: Adequate for Discharge   Problem: Skin Integrity: Goal: Risk for impaired skin integrity will decrease Outcome: Adequate for Discharge   Problem: Education: Goal: Understanding of cardiac disease, CV risk reduction, and recovery process will improve Outcome: Adequate for Discharge Goal: Individualized Educational Video(s) Outcome: Adequate for Discharge    Problem: Activity: Goal: Ability to tolerate increased activity will improve Outcome: Adequate for Discharge   Problem: Cardiac: Goal: Ability to achieve and maintain adequate cardiovascular perfusion will improve Outcome: Adequate for Discharge   Problem: Health Behavior/Discharge Planning: Goal: Ability to safely manage health-related needs after discharge will improve Outcome: Adequate for Discharge   Problem: Education: Goal: Understanding of CV disease, CV risk reduction, and recovery process will improve Outcome: Adequate for Discharge Goal: Individualized Educational Video(s) Outcome: Adequate for Discharge   Problem: Activity: Goal: Ability to return to baseline activity level will improve Outcome: Adequate for Discharge   Problem: Cardiovascular: Goal: Ability to achieve and maintain adequate cardiovascular perfusion will improve Outcome: Adequate for Discharge Goal: Vascular access site(s) Level 0-1 will be maintained Outcome: Adequate for Discharge   Problem: Health Behavior/Discharge Planning: Goal: Ability to safely manage health-related needs after discharge will improve Outcome: Adequate for Discharge

## 2023-05-26 NOTE — TOC Transition Note (Signed)
 Transition of Care [facility]) - Discharge Note   Patient Details  Name: Ariel Harris MRN: [id number] Date of Birth: 01/25/1946  [facility] CM/SW Contact:  Jennett Model, RN Phone Number: 05/26/2023, 4:49 PM   Clinical Narrative:    For dc today, she has transportation at dc. Has no needs.         Patient Goals and CMS Choice            Discharge Placement                       Discharge Plan and Services Additional resources added to the After Visit Summary for                                       Social Drivers of Health (SDOH) Interventions SDOH Screenings   Food Insecurity: No Food Insecurity (05/25/2023)  Housing: Low Risk  (05/25/2023)  Transportation Needs: No Transportation Needs (05/25/2023)  Utilities: Not At Risk (05/25/2023)  Alcohol Screen: Low Risk  (07/09/2019)  Depression (PHQ2-9): Low Risk  (04/18/2021)  Financial Resource Strain: Not on File (02/19/2022)   Received from [place], [place]  Physical Activity: Not on File (02/19/2022)   Received from [place], [place]  Social Connections: Moderately Isolated (05/25/2023)  Stress: Not on File (02/19/2022)   Received from [facility], [place]  Tobacco Use: Low Risk  (05/24/2023)     Readmission Risk Interventions     No data to display

## 2023-05-26 NOTE — TOC CM/SW Note (Signed)
 Transition of Care [facility]) - Inpatient Brief Assessment   Patient Details  Name: Kahleesi Tetter MRN: [id number] Date of Birth: 1945-05-18  Transition of Care [facility]) CM/SW Contact:    Jennett Model, RN Phone Number: 05/26/2023, 11:30 AM   Clinical Narrative: NCM used video interpreter [phone] Almyra Jain), she is from home with her grand daughter who is at the bedside and patient gives this NCM permission to speak with her also, has PCP , Dr. Lorena Rolling and insurance on file, states has no HH services in place at this time or DME at home.  States family member will transport them home at [employer] and family is support system, states gets medications from [facility] on [address]  Pta self ambulatory.   Transition of Care Asessment: Insurance and Status: Insurance coverage has been reviewed Patient has primary care physician: Yes Home environment has been reviewed: lives with grand daughter Prior level of function:: indep Prior/Current Home Services: No current home services Social Drivers of Health Review: SDOH reviewed no interventions necessary Readmission risk has been reviewed: Yes Transition of care needs: no transition of care needs at this time

## 2023-05-26 NOTE — Care Management Obs Status (Signed)
 MEDICARE OBSERVATION STATUS NOTIFICATION   Patient Details  Name: Ariel Harris MRN: [id number] Date of Birth: Jun 19, 1945   Medicare Observation Status Notification Given:  Yes    Janith Melnick 05/26/2023, 9:06 AM

## 2023-05-26 NOTE — Discharge Summary (Signed)
 Physician Discharge Summary  Ariel Harris YQM:[id number] DOB: [birth year] DOA: 05/24/2023  PCP: Ariel Hockey, MD  Admit date: 05/24/2023 Discharge date: 05/26/23  Admitted From: Home Disposition: Home Recommendations for Outpatient Follow-up:  Outpatient follow-up with PCP as below Cardiology to arrange outpatient follow-up Check CMP and CBC at follow-up Please follow up on the following pending results: None  Home Health: No need identified Equipment/Devices: No need identified  Discharge Condition: Stable CODE STATUS: Full code  Follow-up Information     Ariel Hockey, MD. Schedule an appointment as soon as possible for a visit in 1 week(s).   Specialty: Internal Medicine Contact information: [address] Ariel Harris [place] [address] [phone]                 Hospital course 78 year old F with PMH of SVT, PACs, HTN, HLD, prediabetes, osteoarthritis and osteoporosis presenting with intermittent chest pain for 4 days worse with exertion, and admitted for stable angina.  BP elevated to 183/91.  High-sensitivity troponin 24 and 23.  EKG without acute ischemic finding.  CXR without acute finding.  She was given full dose aspirin .  And cardiology consulted.    LHC 1/28 with minimal nonobstructive CAD and normal left ventricular filling pressure.  LDL 125.  A1c 5.8%.  TSH normal.  Patient was cleared for discharge by cardiology for outpatient follow-up.  See individual problem list below for more.   Problems addressed during this hospitalization Intermittent chest pain: Reports exertional chest pain and DOE.  Mildly elevated troponin.  No acute ischemic finding on EKG. CXR negative.  Significant risk factors including prediabetes, HTN and HLD.  TTE without significant finding.  LDL 125.  She was on pravastatin  at home.  A1c 5.8%.  TSH normal.  LHC with nonobstructive CAD. -Continue Toprol -XL, low-dose aspirin , Crestor. -Cleared for discharge by cardiology    History of SVT/PACs: Currently in sinus rhythm.  TSH normal. -Now on Toprol -XL   Essential hypertension: BP was elevated on arrival.  Now normotensive.  TSH normal -Toprol -XL as above   Hyperlipidemia: LDL 125 - Pravastatin  changed to Crestor   Prediabetes: A1c 5.8% -Lifestyle change   Osteoarthritis - Tylenol  and oxycodone  as needed   Osteoporosis -Outpatient follow-up              Time spent 35 minutes  Vital signs Vitals:   05/26/23 1434 05/26/23 1439 05/26/23 1444 05/26/23 1501  BP: (!) 140/83 (!) 147/79 (!) 147/79 132/88  Pulse: 68 72 (!) 0   Temp:    98.6 F (37 C)  Resp: 14 18  20   Height:      Weight:      SpO2: 92% 95%  97%  TempSrc:    Oral  BMI (Calculated):         Discharge exam  GENERAL: No apparent distress.  Nontoxic. HEENT: MMM.  Vision and hearing grossly intact.  NECK: Supple.  No apparent JVD.  RESP:  No IWOB.  Fair aeration bilaterally. CVS:  RRR. Heart sounds normal.  ABD/GI/GU: BS+. Abd soft, NTND.  MSK/EXT:  Moves extremities. No apparent deformity. No edema.  SKIN: no apparent skin lesion or wound NEURO: Awake and alert. Oriented appropriately.  No apparent focal neuro deficit. PSYCH: Calm. Normal affect.  Discharge Instructions Discharge Instructions     Diet - low sodium heart healthy   Complete by: As directed    Discharge instructions   Complete by: As directed    It has been a pleasure taking care of  you!  You were hospitalized due to intermittent chest pain for which you had heart catheterization that did not show significant blockage.  Your cholesterol is slightly elevated.  We have started you on Crestor and low-dose aspirin .  Please take these medications as prescribed.  Follow-up with your primary care doctor in 1 to 2 weeks or sooner if needed.  Follow-up with cardiology per their recommendation.   Take care,   Increase activity slowly   Complete by: As directed       Allergies as of 05/26/2023        Reactions   Indomethacin Swelling   Tramadol  Other (See Comments)   Sweaty, made feel bad        Medication List     STOP taking these medications    pravastatin  40 MG tablet Commonly known as: PRAVACHOL        TAKE these medications    albuterol 108 (90 Base) MCG/ACT inhaler Commonly known as: VENTOLIN HFA Inhale 2 puffs into the lungs every 6 (six) hours as needed for wheezing or shortness of breath.   alendronate  70 MG tablet Commonly known as: FOSAMAX  TAKE 1 TABLET BY MOUTH ONCE A WEEK. TAKE WITH A FULL GLASS OF WATER  ON AN EMPTY STOMACH.   aspirin  EC 81 MG tablet Take 1 tablet (81 mg total) by mouth daily. Swallow whole. Start taking on: May 27, 2023   cetirizine 10 MG tablet Commonly known as: ZYRTEC Take 1 tablet by mouth daily.   fluticasone 50 MCG/ACT nasal spray Commonly known as: FLONASE Place 1 spray into both nostrils.   metoprolol  succinate 25 MG 24 hr tablet Commonly known as: TOPROL -XL TAKE 1 TABLET BY MOUTH DAILY, MAY TAKE 1 EXTRA TABLET BY MOUTH DAILY ONLY AS NEEDED FOR PALPITATIONS   rosuvastatin 40 MG tablet Commonly known as: CRESTOR Take 1 tablet (40 mg total) by mouth daily. Start taking on: May 27, 2023        Consultations: Cardiology  Procedures/Studies:   CARDIAC CATHETERIZATION Result Date: 05/26/2023 Conclusions: Mild, non-obstructive coronary artery disease. Normal left ventricular filling pressure. Recommendations: Continue medical therapy and risk factor modification to prevent progression of mild coronary artery disease. Ariel Crisp, MD [facility]  ECHOCARDIOGRAM COMPLETE Result Date: 05/25/2023    ECHOCARDIOGRAM REPORT   Patient Name:   Ariel Harris Ariel Harris Date of Exam: 05/25/2023 Medical Rec #:  [id number]      Height:       66.0 in Accession #:    [id number]     Weight:       171.0 lb Date of Birth:  Mar 06, 1945      BSA:          1.871 m Patient Age:    78 years       BP:           159/91 mmHg Patient Gender: F               HR:           47 bpm. Exam Location:  Inpatient Procedure: 2D Echo, Cardiac Doppler and Color Doppler (Both Spectral and Color            Flow Doppler were utilized during procedure). Indications:    Chest Ariel Harris R07.9  History:        Patient has no prior history of Echocardiogram examinations.                 Arrythmias:Tachycardia, Signs/Symptoms:Chest Pain; Risk  Factors:Hypertension and Dyslipidemia.  Sonographer:    Terrilee Few RCS Referring Phys: [phone] RONDELL A SMITH IMPRESSIONS  1. Left ventricular ejection fraction, by estimation, is 60 to 65%. The left ventricle has normal function. The left ventricle has no regional wall motion abnormalities. Left ventricular diastolic parameters were normal.  2. Right ventricular systolic function is normal. The right ventricular size is normal.  3. The mitral valve is normal in structure. Trivial mitral valve regurgitation. No evidence of mitral stenosis.  4. The aortic valve is tricuspid. Aortic valve regurgitation is not visualized. No aortic stenosis is present.  5. The inferior vena cava is normal in size with greater than 50% respiratory variability, suggesting right atrial pressure of 3 mmHg. FINDINGS  Left Ventricle: Left ventricular ejection fraction, by estimation, is 60 to 65%. The left ventricle has normal function. The left ventricle has no regional wall motion abnormalities. The left ventricular internal cavity size was normal in size. There is  no left ventricular hypertrophy. Left ventricular diastolic parameters were normal. Right Ventricle: The right ventricular size is normal. No increase in right ventricular wall thickness. Right ventricular systolic function is normal. Left Atrium: Left atrial size was normal in size. Right Atrium: Right atrial size was normal in size. Pericardium: There is no evidence of pericardial effusion. Mitral Valve: The mitral valve is normal in structure. Trivial mitral valve regurgitation. No  evidence of mitral valve stenosis. Tricuspid Valve: The tricuspid valve is normal in structure. Tricuspid valve regurgitation is trivial. No evidence of tricuspid stenosis. Aortic Valve: The aortic valve is tricuspid. Aortic valve regurgitation is not visualized. No aortic stenosis is present. Aortic valve peak gradient measures 8.6 mmHg. Pulmonic Valve: The pulmonic valve was normal in structure. Pulmonic valve regurgitation is not visualized. No evidence of pulmonic stenosis. Aorta: The aortic root is normal in size and structure. Venous: The inferior vena cava is normal in size with greater than 50% respiratory variability, suggesting right atrial pressure of 3 mmHg. IAS/Shunts: No atrial level shunt detected by color flow Doppler.  LEFT VENTRICLE PLAX 2D LVIDd:         4.60 cm   Diastology LVIDs:         2.80 cm   LV e' medial:    6.53 cm/s LV PW:         0.90 cm   LV E/e' medial:  10.1 LV IVS:        1.00 cm   LV e' lateral:   10.20 cm/s LVOT diam:     2.00 cm   LV E/e' lateral: 6.5 LV SV:         82 LV SV Index:   44 LVOT Area:     3.14 cm  RIGHT VENTRICLE            IVC RV S prime:     9.73 cm/s  IVC diam: 1.20 cm TAPSE (M-mode): 2.0 cm LEFT ATRIUM             Index        RIGHT ATRIUM           Index LA diam:        3.00 cm 1.60 cm/m   RA Area:     12.40 cm LA Vol (A2C):   30.6 ml 16.35 ml/m  RA Volume:   19.50 ml  10.42 ml/m LA Vol (A4C):   38.0 ml 20.31 ml/m LA Biplane Vol: 34.8 ml 18.60 ml/m  AORTIC VALVE AV Area (Vmax): 2.24  cm AV Vmax:        147.00 cm/s AV Peak Grad:   8.6 mmHg LVOT Vmax:      105.00 cm/s LVOT Vmean:     70.600 cm/s LVOT VTI:       0.260 m  AORTA Ao Root diam: 3.50 cm Ao Asc diam:  3.80 cm MITRAL VALVE               TRICUSPID VALVE MV Area (PHT): 3.91 cm    TR Peak grad:   3.0 mmHg MV Decel Time: 194 msec    TR Vmax:        87.00 cm/s MV E velocity: 66.00 cm/s MV A velocity: 65.00 cm/s  SHUNTS MV E/A ratio:  1.02        Systemic VTI:  0.26 m                            Systemic  Diam: 2.00 cm Dorothye Gathers MD Electronically signed by Dorothye Gathers MD Signature Date/Time: 05/25/2023/3:27:37 PM    Final    DG Chest Port 1 View Result Date: 05/24/2023 CLINICAL DATA:  Left chest pain x1 week EXAM: PORTABLE CHEST 1 VIEW COMPARISON:  06/13/2021 FINDINGS: Lungs are clear.  No pleural effusion or pneumothorax. The heart is top-normal in size. IMPRESSION: No active disease. Electronically Signed   By: Zadie Herter M.D.   On: 05/24/2023 22:42       The results of significant diagnostics from this hospitalization (including imaging, microbiology, ancillary and laboratory) are listed below for reference.     Microbiology: Recent Results (from the past 240 hours)  Surgical PCR screen     Status: None   Collection Time: 05/25/23  9:00 PM   Specimen: Nasal Mucosa; Nasal Swab  Result Value Ref Range Status   MRSA, PCR NEGATIVE NEGATIVE Final   Staphylococcus aureus NEGATIVE NEGATIVE Final    Comment: (NOTE) The Xpert SA Assay (FDA approved for NASAL specimens in patients 70 years of age and older), is one component of a comprehensive surveillance program. It is not intended to diagnose infection nor to guide or monitor treatment. Performed at [facility], [address] [place], [place] [address]      Labs:  CBC: Recent Labs  Lab 05/24/23 2030 05/26/23 0313  WBC 5.1 4.8  HGB 14.5 14.1  HCT 44.2 43.4  MCV 89.5 90.4  PLT 197 177   BMP &GFR Recent Labs  Lab 05/24/23 2030 05/26/23 0313  NA 138 140  K 3.8 4.1  CL 105 106  CO2 25 26  GLUCOSE 86 113*  BUN 13 18  CREATININE 0.84 0.86  CALCIUM 9.9 8.8*   Estimated Creatinine Clearance: 58.4 mL/min (by C-G formula based on SCr of 0.86 mg/dL). Liver & Pancreas: Recent Labs  Lab 05/26/23 0313  AST 19  ALT 14  ALKPHOS 27*  BILITOT 1.2  PROT 6.6  ALBUMIN 3.2*   No results for input(s): "LIPASE", "AMYLASE" in the last 168 hours. No results for input(s): "AMMONIA" in the last 168  hours. Diabetic: Recent Labs    05/25/23 0639  HGBA1C 5.8*   No results for input(s): "GLUCAP" in the last 168 hours. Cardiac Enzymes: No results for input(s): "CKTOTAL", "CKMB", "CKMBINDEX", "TROPONINI" in the last 168 hours. No results for input(s): "PROBNP" in the last 8760 hours. Coagulation Profile: No results for input(s): "INR", "PROTIME" in the last 168 hours. Thyroid  Function Tests: Recent  Labs    05/25/23 1052  TSH 1.244   Lipid Profile: Recent Labs    05/25/23 1052  CHOL 196  HDL 58  LDLCALC 125*  TRIG 64  CHOLHDL 3.4   Anemia Panel: No results for input(s): "VITAMINB12", "FOLATE", "FERRITIN", "TIBC", "IRON", "RETICCTPCT" in the last 72 hours. Urine analysis:    Component Value Date/Time   COLORURINE YELLOW 11/24/2009 0124   APPEARANCEUR CLOUDY (A) 11/24/2009 0124   LABSPEC 1.013 11/24/2009 0124   PHURINE 7.5 11/24/2009 0124   GLUCOSEU NEGATIVE 11/24/2009 0124   HGBUR NEGATIVE 11/24/2009 0124   BILIRUBINUR negative 04/12/2019 1119   KETONESUR negative 04/12/2019 1119   KETONESUR NEGATIVE 11/24/2009 0124   PROTEINUR negative 07/10/2018 0819   PROTEINUR NEGATIVE 11/24/2009 0124   UROBILINOGEN 0.2 04/12/2019 1119   UROBILINOGEN 0.2 11/24/2009 0124   NITRITE Negative 04/12/2019 1119   NITRITE NEGATIVE 11/24/2009 0124   LEUKOCYTESUR Large (3+) (A) 04/12/2019 1119   Sepsis Labs: Invalid input(s): "PROCALCITONIN", "LACTICIDVEN"   SIGNED:  Theadore Finger, MD  [facility] 05/26/2023, 4:47 PM

## 2023-05-26 NOTE — H&P (View-Only) (Signed)
 Rounding Note    Patient Name: Ariel Harris Date of Encounter: 05/26/2023  [facility] Cardiologist: Antoinette Batman, MD   Subjective   History obtained with the assistance of interpreter.  No CP or dyspnea  Inpatient Medications    Scheduled Meds:  aspirin  EC  81 mg Oral Daily   heparin  5,000 Units Subcutaneous Q8H   metoprolol  succinate  25 mg Oral Daily   mupirocin  ointment  1 Application Nasal BID   pravastatin   40 mg Oral QHS   Continuous Infusions:  sodium chloride  10 mL/hr at 05/26/23 0524   PRN Meds: acetaminophen  **OR** acetaminophen , albuterol, fentaNYL  (SUBLIMAZE ) injection, hydrALAZINE, nitroGLYCERIN , ondansetron  (ZOFRAN ) IV, oxyCODONE    Vital Signs    Vitals:   05/25/23 2015 05/26/23 0129 05/26/23 0436 05/26/23 0740  BP: 127/70 (!) 162/86 (!) 155/85 (!) 149/85  Pulse: 60 64 66 65  Resp: 20 19 19 20   Temp: 98 F (36.7 C) 98.2 F (36.8 C) 98.5 F (36.9 C) 98 F (36.7 C)  TempSrc: Oral Oral Oral Oral  SpO2: 96% 95% 95% 94%  Weight:   77.4 kg   Height:        Intake/Output Summary (Last 24 hours) at 05/26/2023 0818 Last data filed at 05/25/2023 2200 Gross per 24 hour  Intake 360 ml  Output --  Net 360 ml      05/26/2023    4:36 AM 05/25/2023    4:25 AM 05/24/2023    8:20 PM  Last 3 Weights  Weight (lbs) 170 lb 10.2 oz 171 lb 163 lb 2.3 oz  Weight (kg) 77.4 kg 77.565 kg 74 kg      Telemetry    Sinus - Personally Reviewed   Physical Exam   GEN: No acute distress.   Neck: No JVD Cardiac: RRR, no murmurs, rubs, or gallops.  Respiratory: Clear to auscultation bilaterally. GI: Soft, nontender, non-distended  MS: No edema Neuro:  Nonfocal  Psych: Normal affect   Labs     Chemistry Recent Labs  Lab 05/24/23 2030 05/26/23 0313  NA 138 140  K 3.8 4.1  CL 105 106  CO2 25 26  GLUCOSE 86 113*  BUN 13 18  CREATININE 0.84 0.86  CALCIUM 9.9 8.8*  PROT  --  6.6  ALBUMIN  --  3.2*  AST  --  19  ALT  --  14   ALKPHOS  --  27*  BILITOT  --  1.2  GFRNONAA >60 >60  ANIONGAP 9 8    Lipids  Recent Labs  Lab 05/25/23 1052  CHOL 196  TRIG 64  HDL 58  LDLCALC 125*  CHOLHDL 3.4    Hematology Recent Labs  Lab 05/24/23 2030 05/26/23 0313  WBC 5.1 4.8  RBC 4.94 4.80  HGB 14.5 14.1  HCT 44.2 43.4  MCV 89.5 90.4  MCH 29.4 29.4  MCHC 32.8 32.5  RDW 12.9 12.9  PLT 197 177   Thyroid   Recent Labs  Lab 05/25/23 1052  TSH 1.244      Radiology    ECHOCARDIOGRAM COMPLETE Result Date: 05/25/2023    ECHOCARDIOGRAM REPORT   Patient Name:   Ariel Harris Date of Exam: 05/25/2023 Medical Rec #:  [id number]      Height:       66.0 in Accession #:    [id number]     Weight:       171.0 lb Date of Birth:  [birth year]  BSA:          1.871 m Patient Age:    78 years       BP:           159/91 mmHg Patient Gender: F              HR:           47 bpm. Exam Location:  Inpatient Procedure: 2D Echo, Cardiac Doppler and Color Doppler (Both Spectral and Color            Flow Doppler were utilized during procedure). Indications:    Chest [name] R07.9  History:        Patient has no prior history of Echocardiogram examinations.                 Arrythmias:Tachycardia, Signs/Symptoms:Chest Pain; Risk                 Factors:Hypertension and Dyslipidemia.  Sonographer:    Terrilee Few RCS Referring Phys: [phone] RONDELL A SMITH IMPRESSIONS  1. Left ventricular ejection fraction, by estimation, is 60 to 65%. The left ventricle has normal function. The left ventricle has no regional wall motion abnormalities. Left ventricular diastolic parameters were normal.  2. Right ventricular systolic function is normal. The right ventricular size is normal.  3. The mitral valve is normal in structure. Trivial mitral valve regurgitation. No evidence of mitral stenosis.  4. The aortic valve is tricuspid. Aortic valve regurgitation is not visualized. No aortic stenosis is present.  5. The inferior vena cava is normal in size with  greater than 50% respiratory variability, suggesting right atrial pressure of 3 mmHg. FINDINGS  Left Ventricle: Left ventricular ejection fraction, by estimation, is 60 to 65%. The left ventricle has normal function. The left ventricle has no regional wall motion abnormalities. The left ventricular internal cavity size was normal in size. There is  no left ventricular hypertrophy. Left ventricular diastolic parameters were normal. Right Ventricle: The right ventricular size is normal. No increase in right ventricular wall thickness. Right ventricular systolic function is normal. Left Atrium: Left atrial size was normal in size. Right Atrium: Right atrial size was normal in size. Pericardium: There is no evidence of pericardial effusion. Mitral Valve: The mitral valve is normal in structure. Trivial mitral valve regurgitation. No evidence of mitral valve stenosis. Tricuspid Valve: The tricuspid valve is normal in structure. Tricuspid valve regurgitation is trivial. No evidence of tricuspid stenosis. Aortic Valve: The aortic valve is tricuspid. Aortic valve regurgitation is not visualized. No aortic stenosis is present. Aortic valve peak gradient measures 8.6 mmHg. Pulmonic Valve: The pulmonic valve was normal in structure. Pulmonic valve regurgitation is not visualized. No evidence of pulmonic stenosis. Aorta: The aortic root is normal in size and structure. Venous: The inferior vena cava is normal in size with greater than 50% respiratory variability, suggesting right atrial pressure of 3 mmHg. IAS/Shunts: No atrial level shunt detected by color flow Doppler.  LEFT VENTRICLE PLAX 2D LVIDd:         4.60 cm   Diastology LVIDs:         2.80 cm   LV e' medial:    6.53 cm/s LV PW:         0.90 cm   LV E/e' medial:  10.1 LV IVS:        1.00 cm   LV e' lateral:   10.20 cm/s LVOT diam:     2.00 cm   LV E/e'  lateral: 6.5 LV SV:         82 LV SV Index:   44 LVOT Area:     3.14 cm  RIGHT VENTRICLE            IVC RV S prime:      9.73 cm/s  IVC diam: 1.20 cm TAPSE (M-mode): 2.0 cm LEFT ATRIUM             Index        RIGHT ATRIUM           Index LA diam:        3.00 cm 1.60 cm/m   RA Area:     12.40 cm LA Vol (A2C):   30.6 ml 16.35 ml/m  RA Volume:   19.50 ml  10.42 ml/m LA Vol (A4C):   38.0 ml 20.31 ml/m LA Biplane Vol: 34.8 ml 18.60 ml/m  AORTIC VALVE AV Area (Vmax): 2.24 cm AV Vmax:        147.00 cm/s AV Peak Grad:   8.6 mmHg LVOT Vmax:      105.00 cm/s LVOT Vmean:     70.600 cm/s LVOT VTI:       0.260 m  AORTA Ao Root diam: 3.50 cm Ao Asc diam:  3.80 cm MITRAL VALVE               TRICUSPID VALVE MV Area (PHT): 3.91 cm    TR Peak grad:   3.0 mmHg MV Decel Time: 194 msec    TR Vmax:        87.00 cm/s MV E velocity: 66.00 cm/s MV A velocity: 65.00 cm/s  SHUNTS MV E/A ratio:  1.02        Systemic VTI:  0.26 m                            Systemic Diam: 2.00 cm Dorothye Gathers MD Electronically signed by Dorothye Gathers MD Signature Date/Time: 05/25/2023/3:27:37 PM    Final    DG Chest Port 1 View Result Date: 05/24/2023 CLINICAL DATA:  Left chest pain x1 week EXAM: PORTABLE CHEST 1 VIEW COMPARISON:  06/13/2021 FINDINGS: Lungs are clear.  No pleural effusion or pneumothorax. The heart is top-normal in size. IMPRESSION: No active disease. Electronically Signed   By: Zadie Herter M.D.   On: 05/24/2023 22:42    Patient Profile     78 y.o. female with exertional chest pain.  Echocardiogram shows normal LV function.  Assessment & Plan    1 chest pain-troponins are flat and not consistent with acute coronary syndrome.  Echocardiogram shows normal LV function.  She has noted to have coronary calcification on prior CT scan.  Per Dr. Renna Cary plan is for cardiac catheterization today.   2 hypertension-patient's blood pressure is elevated.  However was controlled yesterday.  Will continue Toprol  at present dose and follow.  Add additional medications as needed.  3 hyperlipidemia-given documented coronary calcification on previous CT  scan will discontinue pravastatin  and treat with Crestor 40 mg daily.  Check lipids and liver in 8 weeks.  Goal LDL less than 55.  For questions or updates, please contact [facility] Please consult [email or web address] for contact info under        Signed, Alexandria Angel, MD  05/26/2023, 8:18 AM

## 2023-05-26 NOTE — Plan of Care (Signed)

## 2023-05-26 NOTE — Progress Notes (Signed)
 PROGRESS NOTE  Ariel Harris EAV:[id number] DOB: October 14, 1945   PCP: Everlyn Hockey, MD  Patient is from: Home.  Lives with family.  DOA: 05/24/2023 LOS: 0  Chief complaints Chief Complaint  Patient presents with   Chest Pain     Brief Narrative / Interim history: 78 year old F with PMH of SVT, PACs, HTN, HLD, prediabetes, osteoarthritis and osteoporosis presenting with intermittent chest pain for 4 days worse with exertion, and admitted for stable angina.  BP elevated to 183/91.  High-sensitivity troponin 24 and 23.  EKG without acute ischemic finding.  CXR without acute finding.  She was given full dose aspirin .  And cardiology consulted.  Plan for LHC today  Subjective: Seen and examined earlier this morning with the help of video interpreter with ID number [id number].  No major events overnight of this morning.  Reports improvement in the chest pain but has not resolved completely.  She rates her pain as "just a little".  Also reports DOE.  Denies orthopnea, lightheadedness, nausea or vomiting.   Objective: Vitals:   05/26/23 0129 05/26/23 0436 05/26/23 0740 05/26/23 1056  BP: (!) 162/86 (!) 155/85 (!) 149/85 (!) 150/75  Pulse: 64 66 65 (!) 57  Resp: 19 19 20 20   Temp: 98.2 F (36.8 C) 98.5 F (36.9 C) 98 F (36.7 C) 97.7 F (36.5 C)  TempSrc: Oral Oral Oral Oral  SpO2: 95% 95% 94% 95%  Weight:  77.4 kg    Height:        Examination:  GENERAL: No apparent distress.  Nontoxic. HEENT: MMM.  Vision and hearing grossly intact.  NECK: Supple.  No apparent JVD.  RESP:  No IWOB.  Fair aeration bilaterally. CVS:  RRR. Heart sounds normal.  ABD/GI/GU: BS+. Abd soft, NTND.  MSK/EXT:  Moves extremities. No apparent deformity. No edema.  SKIN: no apparent skin lesion or wound NEURO: Awake, alert and oriented appropriately.  No apparent focal neuro deficit. PSYCH: Calm. Normal affect.   Consultants:  Cardiology  Procedures: None  Microbiology  summarized: None  Assessment and plan: Stable angina: Reports exertional chest pain and DOE.  Mildly elevated troponin.  No acute ischemic finding on EKG. CXR negative.  Significant risk factors including prediabetes, HTN and HLD.  TTE without significant finding.  LDL 125.  She was on pravastatin  at home.  A1c 5.8%.  TSH normal. -Plan for LHC per cardiology -Now on Toprol -XL, low-dose aspirin , Crestor.   History of SVT/PACs: Currently in sinus rhythm -Now on Toprol -XL  Essential hypertension: BP was elevated on arrival.  Now normotensive.  TSH normal -Toprol -XL as above   Hyperlipidemia: LDL 125 - Pravastatin  changed to Crestor   Prediabetes: A1c 5.8% -Lifestyle change  Osteoarthritis - Tylenol  and oxycodone  as needed  Osteoporosis -Outpatient follow-up   Body mass index is 27.54 kg/m.           DVT prophylaxis:  heparin injection 5,000 Units Start: 05/25/23 0600  Code Status: Full code Family Communication: None at bedside Level of care: Telemetry Cardiac Status is: Observation The patient will require care spanning > 2 midnights and should be moved to inpatient because: Stable angina   Final disposition: Home   55 minutes with more than 50% spent in reviewing records, counseling patient/family and coordinating care.   Sch Meds:  Scheduled Meds:  aspirin  EC  81 mg Oral Daily   heparin  5,000 Units Subcutaneous Q8H   metoprolol  succinate  25 mg Oral Daily   mupirocin  ointment  1 Application  Nasal BID   rosuvastatin  40 mg Oral Daily   Continuous Infusions:  sodium chloride  10 mL/hr at 05/26/23 0524   PRN Meds:.acetaminophen  **OR** acetaminophen , albuterol, fentaNYL  (SUBLIMAZE ) injection, hydrALAZINE, nitroGLYCERIN , ondansetron  (ZOFRAN ) IV, oxyCODONE   Antimicrobials: Anti-infectives (From admission, onward)    None        I have personally reviewed the following labs and images: CBC: Recent Labs  Lab 05/24/23 2030 05/26/23 0313  WBC 5.1  4.8  HGB 14.5 14.1  HCT 44.2 43.4  MCV 89.5 90.4  PLT 197 177   BMP &GFR Recent Labs  Lab 05/24/23 2030 05/26/23 0313  NA 138 140  K 3.8 4.1  CL 105 106  CO2 25 26  GLUCOSE 86 113*  BUN 13 18  CREATININE 0.84 0.86  CALCIUM 9.9 8.8*   Estimated Creatinine Clearance: 58.4 mL/min (by C-G formula based on SCr of 0.86 mg/dL). Liver & Pancreas: Recent Labs  Lab 05/26/23 0313  AST 19  ALT 14  ALKPHOS 27*  BILITOT 1.2  PROT 6.6  ALBUMIN 3.2*   No results for input(s): "LIPASE", "AMYLASE" in the last 168 hours. No results for input(s): "AMMONIA" in the last 168 hours. Diabetic: Recent Labs    05/25/23 0639  HGBA1C 5.8*   No results for input(s): "GLUCAP" in the last 168 hours. Cardiac Enzymes: No results for input(s): "CKTOTAL", "CKMB", "CKMBINDEX", "TROPONINI" in the last 168 hours. No results for input(s): "PROBNP" in the last 8760 hours. Coagulation Profile: No results for input(s): "INR", "PROTIME" in the last 168 hours. Thyroid  Function Tests: Recent Labs    05/25/23 1052  TSH 1.244   Lipid Profile: Recent Labs    05/25/23 1052  CHOL 196  HDL 58  LDLCALC 125*  TRIG 64  CHOLHDL 3.4   Anemia Panel: No results for input(s): "VITAMINB12", "FOLATE", "FERRITIN", "TIBC", "IRON", "RETICCTPCT" in the last 72 hours. Urine analysis:    Component Value Date/Time   COLORURINE YELLOW 11/24/2009 0124   APPEARANCEUR CLOUDY (A) 11/24/2009 0124   LABSPEC 1.013 11/24/2009 0124   PHURINE 7.5 11/24/2009 0124   GLUCOSEU NEGATIVE 11/24/2009 0124   HGBUR NEGATIVE 11/24/2009 0124   BILIRUBINUR negative 04/12/2019 1119   KETONESUR negative 04/12/2019 1119   KETONESUR NEGATIVE 11/24/2009 0124   PROTEINUR negative 07/10/2018 0819   PROTEINUR NEGATIVE 11/24/2009 0124   UROBILINOGEN 0.2 04/12/2019 1119   UROBILINOGEN 0.2 11/24/2009 0124   NITRITE Negative 04/12/2019 1119   NITRITE NEGATIVE 11/24/2009 0124   LEUKOCYTESUR Large (3+) (A) 04/12/2019 1119   Sepsis  Labs: Invalid input(s): "PROCALCITONIN", "LACTICIDVEN"  Microbiology: Recent Results (from the past 240 hours)  Surgical PCR screen     Status: None   Collection Time: 05/25/23  9:00 PM   Specimen: Nasal Mucosa; Nasal Swab  Result Value Ref Range Status   MRSA, PCR NEGATIVE NEGATIVE Final   Staphylococcus aureus NEGATIVE NEGATIVE Final    Comment: (NOTE) The Xpert SA Assay (FDA approved for NASAL specimens in patients 44 years of age and older), is one component of a comprehensive surveillance program. It is not intended to diagnose infection nor to guide or monitor treatment. Performed at [facility], [address] [place], [place] [address]     Radiology Studies: ECHOCARDIOGRAM COMPLETE Result Date: 05/25/2023    ECHOCARDIOGRAM REPORT   Patient Name:   MARDA MIR Purifoy Date of Exam: 05/25/2023 Medical Rec #:  [id number]      Height:       66.0 in Accession #:  [id number]     Weight:       171.0 lb Date of Birth:  12-08-1945      BSA:          1.871 m Patient Age:    78 years       BP:           159/91 mmHg Patient Gender: F              HR:           47 bpm. Exam Location:  Inpatient Procedure: 2D Echo, Cardiac Doppler and Color Doppler (Both Spectral and Color            Flow Doppler were utilized during procedure). Indications:    Chest [name] R07.9  History:        Patient has no prior history of Echocardiogram examinations.                 Arrythmias:Tachycardia, Signs/Symptoms:Chest Pain; Risk                 Factors:Hypertension and Dyslipidemia.  Sonographer:    Terrilee Few RCS Referring Phys: [phone] RONDELL A SMITH IMPRESSIONS  1. Left ventricular ejection fraction, by estimation, is 60 to 65%. The left ventricle has normal function. The left ventricle has no regional wall motion abnormalities. Left ventricular diastolic parameters were normal.  2. Right ventricular systolic function is normal. The right ventricular size is normal.  3. The mitral valve is normal in  structure. Trivial mitral valve regurgitation. No evidence of mitral stenosis.  4. The aortic valve is tricuspid. Aortic valve regurgitation is not visualized. No aortic stenosis is present.  5. The inferior vena cava is normal in size with greater than 50% respiratory variability, suggesting right atrial pressure of 3 mmHg. FINDINGS  Left Ventricle: Left ventricular ejection fraction, by estimation, is 60 to 65%. The left ventricle has normal function. The left ventricle has no regional wall motion abnormalities. The left ventricular internal cavity size was normal in size. There is  no left ventricular hypertrophy. Left ventricular diastolic parameters were normal. Right Ventricle: The right ventricular size is normal. No increase in right ventricular wall thickness. Right ventricular systolic function is normal. Left Atrium: Left atrial size was normal in size. Right Atrium: Right atrial size was normal in size. Pericardium: There is no evidence of pericardial effusion. Mitral Valve: The mitral valve is normal in structure. Trivial mitral valve regurgitation. No evidence of mitral valve stenosis. Tricuspid Valve: The tricuspid valve is normal in structure. Tricuspid valve regurgitation is trivial. No evidence of tricuspid stenosis. Aortic Valve: The aortic valve is tricuspid. Aortic valve regurgitation is not visualized. No aortic stenosis is present. Aortic valve peak gradient measures 8.6 mmHg. Pulmonic Valve: The pulmonic valve was normal in structure. Pulmonic valve regurgitation is not visualized. No evidence of pulmonic stenosis. Aorta: The aortic root is normal in size and structure. Venous: The inferior vena cava is normal in size with greater than 50% respiratory variability, suggesting right atrial pressure of 3 mmHg. IAS/Shunts: No atrial level shunt detected by color flow Doppler.  LEFT VENTRICLE PLAX 2D LVIDd:         4.60 cm   Diastology LVIDs:         2.80 cm   LV e' medial:    6.53 cm/s LV PW:          0.90 cm   LV E/e' medial:  10.1 LV IVS:  1.00 cm   LV e' lateral:   10.20 cm/s LVOT diam:     2.00 cm   LV E/e' lateral: 6.5 LV SV:         82 LV SV Index:   44 LVOT Area:     3.14 cm  RIGHT VENTRICLE            IVC RV S prime:     9.73 cm/s  IVC diam: 1.20 cm TAPSE (M-mode): 2.0 cm LEFT ATRIUM             Index        RIGHT ATRIUM           Index LA diam:        3.00 cm 1.60 cm/m   RA Area:     12.40 cm LA Vol (A2C):   30.6 ml 16.35 ml/m  RA Volume:   19.50 ml  10.42 ml/m LA Vol (A4C):   38.0 ml 20.31 ml/m LA Biplane Vol: 34.8 ml 18.60 ml/m  AORTIC VALVE AV Area (Vmax): 2.24 cm AV Vmax:        147.00 cm/s AV Peak Grad:   8.6 mmHg LVOT Vmax:      105.00 cm/s LVOT Vmean:     70.600 cm/s LVOT VTI:       0.260 m  AORTA Ao Root diam: 3.50 cm Ao Asc diam:  3.80 cm MITRAL VALVE               TRICUSPID VALVE MV Area (PHT): 3.91 cm    TR Peak grad:   3.0 mmHg MV Decel Time: 194 msec    TR Vmax:        87.00 cm/s MV E velocity: 66.00 cm/s MV A velocity: 65.00 cm/s  SHUNTS MV E/A ratio:  1.02        Systemic VTI:  0.26 m                            Systemic Diam: 2.00 cm Dorothye Gathers MD Electronically signed by Dorothye Gathers MD Signature Date/Time: 05/25/2023/3:27:37 PM    Final       Bridgit Eynon T. Lexus Shampine [facility]  If 7PM-7AM, please contact night-coverage [email or web address] 05/26/2023, 11:09 AM

## 2023-05-26 NOTE — Care Management Obs Status (Signed)
 MEDICARE OBSERVATION STATUS NOTIFICATION   Patient Details  Name: Ariel Harris MRN: [id number] Date of Birth: [birth year]   Medicare Observation Status Notification Given:  Yes    Janith Melnick 05/26/2023, 9:04 AM

## 2023-05-26 NOTE — Care Management Obs Status (Signed)
 MEDICARE OBSERVATION STATUS NOTIFICATION   Patient Details  Name: Ariel Harris MRN: [id number] Date of Birth: 1945-04-12   Medicare Observation Status Notification Given:  Yes    Janith Melnick 05/26/2023, 9:09 AM

## 2023-05-26 NOTE — Interval H&P Note (Signed)
 History and Physical Interval Note:  05/26/2023 2:10 PM  Ariel Harris  has presented today for surgery, with the diagnosis of unstable angina.  The various methods of treatment have been discussed with the patient and family. After consideration of risks, benefits and other options for treatment, the patient has consented to  Procedure(s): LEFT HEART CATH AND CORONARY ANGIOGRAPHY (N/A) as a surgical intervention.  The patient's history has been reviewed, patient examined, no change in status, stable for surgery.  I have reviewed the patient's chart and labs.  Questions were answered to the patient's satisfaction.    Cath Lab Visit (complete for each Cath Lab visit)  Clinical Evaluation Leading to the Procedure:   ACS: Yes.    Non-ACS:  N/A  Elah Avellino

## 2023-05-26 NOTE — Progress Notes (Signed)
 Rounding Note    Patient Name: Ariel Harris Date of Encounter: 05/26/2023  [facility] Cardiologist: Antoinette Batman, MD   Subjective   History obtained with the assistance of interpreter.  No CP or dyspnea  Inpatient Medications    Scheduled Meds:  aspirin  EC  81 mg Oral Daily   heparin  5,000 Units Subcutaneous Q8H   metoprolol  succinate  25 mg Oral Daily   mupirocin  ointment  1 Application Nasal BID   pravastatin   40 mg Oral QHS   Continuous Infusions:  sodium chloride  10 mL/hr at 05/26/23 0524   PRN Meds: acetaminophen  **OR** acetaminophen , albuterol, fentaNYL  (SUBLIMAZE ) injection, hydrALAZINE, nitroGLYCERIN , ondansetron  (ZOFRAN ) IV, oxyCODONE    Vital Signs    Vitals:   05/25/23 2015 05/26/23 0129 05/26/23 0436 05/26/23 0740  BP: 127/70 (!) 162/86 (!) 155/85 (!) 149/85  Pulse: 60 64 66 65  Resp: 20 19 19 20   Temp: 98 F (36.7 C) 98.2 F (36.8 C) 98.5 F (36.9 C) 98 F (36.7 C)  TempSrc: Oral Oral Oral Oral  SpO2: 96% 95% 95% 94%  Weight:   77.4 kg   Height:        Intake/Output Summary (Last 24 hours) at 05/26/2023 0818 Last data filed at 05/25/2023 2200 Gross per 24 hour  Intake 360 ml  Output --  Net 360 ml      05/26/2023    4:36 AM 05/25/2023    4:25 AM 05/24/2023    8:20 PM  Last 3 Weights  Weight (lbs) 170 lb 10.2 oz 171 lb 163 lb 2.3 oz  Weight (kg) 77.4 kg 77.565 kg 74 kg      Telemetry    Sinus - Personally Reviewed   Physical Exam   GEN: No acute distress.   Neck: No JVD Cardiac: RRR, no murmurs, rubs, or gallops.  Respiratory: Clear to auscultation bilaterally. GI: Soft, nontender, non-distended  MS: No edema Neuro:  Nonfocal  Psych: Normal affect   Labs     Chemistry Recent Labs  Lab 05/24/23 2030 05/26/23 0313  NA 138 140  K 3.8 4.1  CL 105 106  CO2 25 26  GLUCOSE 86 113*  BUN 13 18  CREATININE 0.84 0.86  CALCIUM 9.9 8.8*  PROT  --  6.6  ALBUMIN  --  3.2*  AST  --  19  ALT  --  14   ALKPHOS  --  27*  BILITOT  --  1.2  GFRNONAA >60 >60  ANIONGAP 9 8    Lipids  Recent Labs  Lab 05/25/23 1052  CHOL 196  TRIG 64  HDL 58  LDLCALC 125*  CHOLHDL 3.4    Hematology Recent Labs  Lab 05/24/23 2030 05/26/23 0313  WBC 5.1 4.8  RBC 4.94 4.80  HGB 14.5 14.1  HCT 44.2 43.4  MCV 89.5 90.4  MCH 29.4 29.4  MCHC 32.8 32.5  RDW 12.9 12.9  PLT 197 177   Thyroid   Recent Labs  Lab 05/25/23 1052  TSH 1.244      Radiology    ECHOCARDIOGRAM COMPLETE Result Date: 05/25/2023    ECHOCARDIOGRAM REPORT   Patient Name:   Ariel BISWELL Kohler Date of Exam: 05/25/2023 Medical Rec #:  [id number]      Height:       66.0 in Accession #:    [id number]     Weight:       171.0 lb Date of Birth:  [birth year]  BSA:          1.871 m Patient Age:    78 years       BP:           159/91 mmHg Patient Gender: F              HR:           47 bpm. Exam Location:  Inpatient Procedure: 2D Echo, Cardiac Doppler and Color Doppler (Both Spectral and Color            Flow Doppler were utilized during procedure). Indications:    Chest [name] R07.9  History:        Patient has no prior history of Echocardiogram examinations.                 Arrythmias:Tachycardia, Signs/Symptoms:Chest Pain; Risk                 Factors:Hypertension and Dyslipidemia.  Sonographer:    Terrilee Few RCS Referring Phys: [phone] RONDELL A SMITH IMPRESSIONS  1. Left ventricular ejection fraction, by estimation, is 60 to 65%. The left ventricle has normal function. The left ventricle has no regional wall motion abnormalities. Left ventricular diastolic parameters were normal.  2. Right ventricular systolic function is normal. The right ventricular size is normal.  3. The mitral valve is normal in structure. Trivial mitral valve regurgitation. No evidence of mitral stenosis.  4. The aortic valve is tricuspid. Aortic valve regurgitation is not visualized. No aortic stenosis is present.  5. The inferior vena cava is normal in size with  greater than 50% respiratory variability, suggesting right atrial pressure of 3 mmHg. FINDINGS  Left Ventricle: Left ventricular ejection fraction, by estimation, is 60 to 65%. The left ventricle has normal function. The left ventricle has no regional wall motion abnormalities. The left ventricular internal cavity size was normal in size. There is  no left ventricular hypertrophy. Left ventricular diastolic parameters were normal. Right Ventricle: The right ventricular size is normal. No increase in right ventricular wall thickness. Right ventricular systolic function is normal. Left Atrium: Left atrial size was normal in size. Right Atrium: Right atrial size was normal in size. Pericardium: There is no evidence of pericardial effusion. Mitral Valve: The mitral valve is normal in structure. Trivial mitral valve regurgitation. No evidence of mitral valve stenosis. Tricuspid Valve: The tricuspid valve is normal in structure. Tricuspid valve regurgitation is trivial. No evidence of tricuspid stenosis. Aortic Valve: The aortic valve is tricuspid. Aortic valve regurgitation is not visualized. No aortic stenosis is present. Aortic valve peak gradient measures 8.6 mmHg. Pulmonic Valve: The pulmonic valve was normal in structure. Pulmonic valve regurgitation is not visualized. No evidence of pulmonic stenosis. Aorta: The aortic root is normal in size and structure. Venous: The inferior vena cava is normal in size with greater than 50% respiratory variability, suggesting right atrial pressure of 3 mmHg. IAS/Shunts: No atrial level shunt detected by color flow Doppler.  LEFT VENTRICLE PLAX 2D LVIDd:         4.60 cm   Diastology LVIDs:         2.80 cm   LV e' medial:    6.53 cm/s LV PW:         0.90 cm   LV E/e' medial:  10.1 LV IVS:        1.00 cm   LV e' lateral:   10.20 cm/s LVOT diam:     2.00 cm   LV E/e'  lateral: 6.5 LV SV:         82 LV SV Index:   44 LVOT Area:     3.14 cm  RIGHT VENTRICLE            IVC RV S prime:      9.73 cm/s  IVC diam: 1.20 cm TAPSE (M-mode): 2.0 cm LEFT ATRIUM             Index        RIGHT ATRIUM           Index LA diam:        3.00 cm 1.60 cm/m   RA Area:     12.40 cm LA Vol (A2C):   30.6 ml 16.35 ml/m  RA Volume:   19.50 ml  10.42 ml/m LA Vol (A4C):   38.0 ml 20.31 ml/m LA Biplane Vol: 34.8 ml 18.60 ml/m  AORTIC VALVE AV Area (Vmax): 2.24 cm AV Vmax:        147.00 cm/s AV Peak Grad:   8.6 mmHg LVOT Vmax:      105.00 cm/s LVOT Vmean:     70.600 cm/s LVOT VTI:       0.260 m  AORTA Ao Root diam: 3.50 cm Ao Asc diam:  3.80 cm MITRAL VALVE               TRICUSPID VALVE MV Area (PHT): 3.91 cm    TR Peak grad:   3.0 mmHg MV Decel Time: 194 msec    TR Vmax:        87.00 cm/s MV E velocity: 66.00 cm/s MV A velocity: 65.00 cm/s  SHUNTS MV E/A ratio:  1.02        Systemic VTI:  0.26 m                            Systemic Diam: 2.00 cm Dorothye Gathers MD Electronically signed by Dorothye Gathers MD Signature Date/Time: 05/25/2023/3:27:37 PM    Final    DG Chest Port 1 View Result Date: 05/24/2023 CLINICAL DATA:  Left chest pain x1 week EXAM: PORTABLE CHEST 1 VIEW COMPARISON:  06/13/2021 FINDINGS: Lungs are clear.  No pleural effusion or pneumothorax. The heart is top-normal in size. IMPRESSION: No active disease. Electronically Signed   By: Zadie Herter M.D.   On: 05/24/2023 22:42    Patient Profile     78 y.o. female with exertional chest pain.  Echocardiogram shows normal LV function.  Assessment & Plan    1 chest pain-troponins are flat and not consistent with acute coronary syndrome.  Echocardiogram shows normal LV function.  She has noted to have coronary calcification on prior CT scan.  Per Dr. Renna Cary plan is for cardiac catheterization today.   2 hypertension-patient's blood pressure is elevated.  However was controlled yesterday.  Will continue Toprol  at present dose and follow.  Add additional medications as needed.  3 hyperlipidemia-given documented coronary calcification on previous CT  scan will discontinue pravastatin  and treat with Crestor 40 mg daily.  Check lipids and liver in 8 weeks.  Goal LDL less than 55.  For questions or updates, please contact [facility] Please consult [email or web address] for contact info under        Signed, Alexandria Angel, MD  05/26/2023, 8:18 AM

## 2023-05-27 ENCOUNTER — Encounter (HOSPITAL_COMMUNITY): Payer: Self-pay | Admitting: Internal Medicine

## 2023-05-27 LAB — LIPOPROTEIN A (LPA): Lipoprotein (a): 65.9 nmol/L — ABNORMAL HIGH (ref <75.0)

## 2023-05-27 MED FILL — Verapamil HCl IV Soln 2.5 MG/ML: INTRAVENOUS | Qty: 2 | Status: AC

## 2023-06-01 NOTE — Progress Notes (Deleted)
 Cardiology Office Note:    Date:  06/01/2023   ID:  Ariel Harris, DOB [birth year], MRN [id number]  PCP:  Everlyn Hockey, MD  Cardiologist:  Antoinette Batman, MD { Click to update primary MD,subspecialty MD or APP then REFRESH:1}    Referring MD: Everlyn Hockey, MD   Chief Complaint: hospital follow-up of chest pain  History of Present Illness:    Ariel Harris is a 78 y.o. female with a history of minimal non-obstructive CAD noted on recent cardiac catheterization on 05/26/2023,palpitations with short runs of SVT and PACs noted on a remote monitor, hypertension, hyperlipidemia, prediabetes, and arthritis who is followed by Dr. Abel Hoe and presents today for hospital follow-up of chest pain.   Patient has a history of intermittent palpitations and chest pain over the years. Remote 48 hour Holter Monitor showed sinus rhythm with PACs and short runs of SVT. Stress tests in 2013 and 2015 were negative. She was last seen in the office by Graciela Lava, PA-C, in 10/2017 at which time she was doing well. She denies any recurrent chest pain at that time and her palpitations were well controlled on a beta-blocker. She was lost to follow-up after this.  She was recently admitted from 05/24/2023 to 05/26/2023 for intermittent exertional chest pain over the last 4 days. BP was markedly elevated on arrival at 183/91. EKG showed no acute ischemic changes. High-sensitivity troponin was minimally elevated and flat at 24 >> 23. Echo showed LVEF of 60-65% with normal wall motion and diastolic parameters, normal RV function, and no significant valvular disease. LHC showed only minimal disease with 10% stenosis noted in proximal RCA.   Patient presents today for follow-up. ***  Minimal Non-Obstructive CAD Patient was recently admitted in 04/2023 for intermittent chest pain. LHC showed minimal CAD with only 10% stenosis of proximal RCA. - No chest pain.  - Continue aspirin  and  statin.  Palpitations Paroxysmal SVT PACs Patient has a history of palpitations with remote monitor showing short runs of SVT and PACs.  - Well controlled. *** - Continue Toprol -XL 25mg  daily. Can continue to take an extra dose as needed for worsening palpitations. ***  Hypertension BP *** - Continue Toprol -XL 25mg  daily.  Hyperlipidemia Lipid panel in 04/2023 during recent admission: Total Cholesterol 196, Triglycerides 64, HDL 58, LDL 125. LDL goal <70 given CAD. - She was switched to Pravastatin  to Crestor  40mg  daily during recent admission. Continue. - Will need repeat lipid panel in 6-8 weeks.  Prediabetes  Hemoglobin A1c 5.8 in 04/2023.  - Management per PCP.   EKGs/Labs/Other Studies Reviewed:    The following studies were reviewed:  Echocardiogram 05/25/2023: Impressions: 1. Left ventricular ejection fraction, by estimation, is 60 to 65%. The  left ventricle has normal function. The left ventricle has no regional  wall motion abnormalities. Left ventricular diastolic parameters were  normal.   2. Right ventricular systolic function is normal. The right ventricular  size is normal.   3. The mitral valve is normal in structure. Trivial mitral valve  regurgitation. No evidence of mitral stenosis.   4. The aortic valve is tricuspid. Aortic valve regurgitation is not  visualized. No aortic stenosis is present.   5. The inferior vena cava is normal in size with greater than 50%  respiratory variability, suggesting right atrial pressure of 3 mmHg.  _______________  Left Cardiac Catheterization 05/26/2023: Conclusions: Mild, non-obstructive coronary artery disease. Normal left ventricular filling pressure.   Recommendations: Continue medical therapy and risk factor  modification to prevent progression of mild coronary artery disease.  Diagnostic Dominance: Right      EKG:  EKG not ordered today.   Recent Labs: 05/25/2023: TSH 1.244 05/26/2023: ALT 14; BUN 18;  Creatinine, Ser 0.86; Hemoglobin 14.1; Platelets 177; Potassium 4.1; Sodium 140  Recent Lipid Panel    Component Value Date/Time   CHOL 196 05/25/2023 1052   CHOL 187 07/09/2019 0928   TRIG 64 05/25/2023 1052   HDL 58 05/25/2023 1052   HDL 69 07/09/2019 0928   CHOLHDL 3.4 05/25/2023 1052   VLDL 13 05/25/2023 1052   LDLCALC 125 (H) 05/25/2023 1052   LDLCALC 106 (H) 07/09/2019 0928    Physical Exam:    Vital Signs: There were no vitals taken for this visit.    Wt Readings from Last 3 Encounters:  05/26/23 170 lb 10.2 oz (77.4 kg)  08/28/21 164 lb (74.4 kg)  06/13/21 155 lb (70.3 kg)     General: 79 y.o. female in no acute distress. HEENT: Normocephalic and atraumatic. Sclera clear.  Neck: Supple. No carotid bruits. No JVD. Heart: *** RRR. Distinct S1 and S2. No murmurs, gallops, or rubs.  Lungs: No increased work of breathing. Clear to ausculation bilaterally. No wheezes, rhonchi, or rales.  Abdomen: Soft, non-distended, and non-tender to palpation.  Extremities: No lower extremity edema.  Radial and distal pedal pulses 2+ and equal bilaterally. Skin: Warm and dry. Neuro: No focal deficits. Psych: Normal affect. Responds appropriately.   Assessment:    No diagnosis found.  Plan:     Disposition: Follow up in ***   Signed, Casimer Clear, PA-C  06/01/2023 8:37 PM    [facility]

## 2023-06-11 ENCOUNTER — Ambulatory Visit: Admitting: Student

## 2023-08-11 ENCOUNTER — Other Ambulatory Visit: Payer: Self-pay | Admitting: Internal Medicine

## 2023-08-11 DIAGNOSIS — Z1231 Encounter for screening mammogram for malignant neoplasm of breast: Secondary | ICD-10-CM

## 2023-11-14 ENCOUNTER — Ambulatory Visit: Admitting: Orthopaedic Surgery

## 2023-11-14 ENCOUNTER — Other Ambulatory Visit (INDEPENDENT_AMBULATORY_CARE_PROVIDER_SITE_OTHER)

## 2023-11-14 DIAGNOSIS — G5601 Carpal tunnel syndrome, right upper limb: Secondary | ICD-10-CM | POA: Diagnosis not present

## 2023-11-14 DIAGNOSIS — M1612 Unilateral primary osteoarthritis, left hip: Secondary | ICD-10-CM | POA: Insufficient documentation

## 2023-11-14 MED ORDER — METHYLPREDNISOLONE 4 MG PO TBPK: ORAL_TABLET | ORAL | 0 refills | Status: AC

## 2023-11-14 NOTE — Progress Notes (Signed)
 Office Visit Note   Patient: Ariel Harris           Date of Birth: 1945-07-15           MRN: [id number] Visit Date: 11/14/2023              Requested by: Florie Cohen, MD [id number] [place],  [place] [address] PCP: Florie Cohen, MD   Assessment & Plan: Visit Diagnoses:  1. Primary osteoarthritis of left hip   2. Right carpal tunnel syndrome     Plan: History of Present Illness Ariel Harris is a 78 year old female who presents with left hip and groin pain.  She experiences hip and groin pain, particularly when walking, with less severe pain during hip rotation. She has a history of knee arthritis and has undergone knee replacement surgery.  Exam of the left hip shows pain with flexion and internal rotation.  No trochanteric tenderness.  No lumbar spine tenderness.  No radicular symptoms.  Exam of the right hand shows subjective numbness to the median nerve distribution.  Results RADIOLOGY Hip X-ray: Degenerative changes in the hip joint consistent with arthritis.  Assessment and Plan Left hip osteoarthritis Chronic left hip osteoarthritis with significant arthritic changes confirmed by X-rays.  Discussed treatment options. - Prescribe pain medication. - Initiate physical therapy at our facility. - Advise return for cortisone injection if pain does not improve with medication and physical therapy. - Discuss potential hip replacement if conservative measures fail.  Paresthesia of right thumb, index, and middle fingers Numbness likely due to carpal tunnel syndrome from median nerve compression. - Advise use of wrist brace for 2 weeks. - Plan nerve conduction study if symptoms do not improve with brace use.  Follow-Up Instructions: No follow-ups on file.   Orders:  Orders Placed This Encounter  Procedures   XR HIP UNILAT W OR W/O PELVIS 2-3 VIEWS LEFT   Ambulatory referral to Physical Therapy   Meds ordered this encounter  Medications    methylPREDNISolone  (MEDROL  DOSEPAK) 4 MG TBPK tablet    Sig: Take as directed    Dispense:  21 tablet    Refill:  0      Procedures: No procedures performed   Clinical Data: No additional findings.   Subjective: Chief Complaint  Patient presents with   Left Hip - Pain    HPI  Review of Systems  Constitutional: Negative.   HENT: Negative.    Eyes: Negative.   Respiratory: Negative.    Cardiovascular: Negative.   Endocrine: Negative.   Musculoskeletal: Negative.   Neurological: Negative.   Hematological: Negative.   Psychiatric/Behavioral: Negative.    All other systems reviewed and are negative.    Objective: Vital Signs: There were no vitals taken for this visit.  Physical Exam Vitals and nursing note reviewed.  Constitutional:      Appearance: She is well-developed.  HENT:     Head: Atraumatic.     Nose: Nose normal.  Eyes:     Extraocular Movements: Extraocular movements intact.  Cardiovascular:     Pulses: Normal pulses.  Pulmonary:     Effort: Pulmonary effort is normal.  Abdominal:     Palpations: Abdomen is soft.  Musculoskeletal:     Cervical back: Neck supple.  Skin:    General: Skin is warm.     Capillary Refill: Capillary refill takes less than 2 seconds.  Neurological:     Mental Status: She is alert. Mental status  is at baseline.  Psychiatric:        Behavior: Behavior normal.        Thought Content: Thought content normal.        Judgment: Judgment normal.     Ortho Exam  Specialty Comments:  No specialty comments available.  Imaging: XR HIP UNILAT W OR W/O PELVIS 2-3 VIEWS LEFT Result Date: 11/14/2023 Xrays of the left hip show advanced osteoarthritis with osteophytes formation of acetabulum.    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/16/2018   SVT (supraventricular tachycardia) 07/09/2018   Osteoporosis 05/25/2017   Hypertension 01/12/2010   Past Medical History:  Diagnosis Date   Arthritis    knees (05/16/2017)   Chest pain    Hyperlipidemia    Hypertension    PAC (premature atrial contraction)    SVT (supraventricular tachycardia)     Family History  Problem Relation Age of Onset   Coronary artery disease Neg Hx    Heart attack Neg Hx    Breast cancer Neg Hx     Past Surgical History:  Procedure Laterality Date   JOINT REPLACEMENT     KNEE ARTHROSCOPY Left 1996   KNEE SURGERY Right [id number]   open surgery; because of the pain   LEFT HEART CATH AND CORONARY ANGIOGRAPHY N/A 05/26/2023   Procedure: LEFT HEART CATH AND CORONARY ANGIOGRAPHY;  Surgeon: Mady Bruckner, MD;  Location: [facility];  Service: Cardiovascular;  Laterality: N/A;   TOTAL KNEE ARTHROPLASTY Right 05/16/2017   TOTAL KNEE ARTHROPLASTY Right 05/16/2017   Procedure: RIGHT TOTAL KNEE ARTHROPLASTY;  Surgeon: Jerri Kay HERO, MD;  Location: [facility] OR;  Service: Orthopedics;  Laterality: Right;   TOTAL KNEE ARTHROPLASTY Left 11/16/2018   Procedure: LEFT TOTAL KNEE ARTHROPLASTY;  Surgeon: Jerri Kay HERO, MD;  Location: [facility] OR;  Service: Orthopedics;  Laterality: Left;   Social History   Occupational History   Not on file  Tobacco Use   Smoking status: Never   Smokeless tobacco: Never  Vaping Use   Vaping status: Never Used  Substance and Sexual Activity   Alcohol use: No   Drug use: No   Sexual activity: Not Currently

## 2023-12-22 ENCOUNTER — Ambulatory Visit (HOSPITAL_BASED_OUTPATIENT_CLINIC_OR_DEPARTMENT_OTHER): Admitting: Family Medicine

## 2024-01-05 ENCOUNTER — Inpatient Hospital Stay: Admission: RE | Admit: 2024-01-05 | Discharge: 2024-01-05 | Attending: Internal Medicine | Admitting: Internal Medicine

## 2024-01-05 DIAGNOSIS — Z1231 Encounter for screening mammogram for malignant neoplasm of breast: Secondary | ICD-10-CM
